# Patient Record
Sex: Female | Born: 1980
Health system: Southern US, Community
[De-identification: ages and names within clinical notes are randomized; demographics above are authoritative.]

## PROBLEM LIST (undated history)

## (undated) DIAGNOSIS — D6800 Von Willebrand disease, unspecified: Secondary | ICD-10-CM

## (undated) DIAGNOSIS — D689 Coagulation defect, unspecified: Secondary | ICD-10-CM

## (undated) DIAGNOSIS — N200 Calculus of kidney: Secondary | ICD-10-CM

## (undated) DIAGNOSIS — G43909 Migraine, unspecified, not intractable, without status migrainosus: Secondary | ICD-10-CM

## (undated) DIAGNOSIS — G9600 Cerebrospinal fluid leak, unspecified: Secondary | ICD-10-CM

## (undated) DIAGNOSIS — I10 Essential (primary) hypertension: Secondary | ICD-10-CM

## (undated) DIAGNOSIS — K219 Gastro-esophageal reflux disease without esophagitis: Secondary | ICD-10-CM

## (undated) DIAGNOSIS — H7090 Unspecified mastoiditis, unspecified ear: Secondary | ICD-10-CM

## (undated) DIAGNOSIS — Z9889 Other specified postprocedural states: Secondary | ICD-10-CM

## (undated) DIAGNOSIS — D649 Anemia, unspecified: Secondary | ICD-10-CM

## (undated) DIAGNOSIS — K279 Peptic ulcer, site unspecified, unspecified as acute or chronic, without hemorrhage or perforation: Secondary | ICD-10-CM

## (undated) DIAGNOSIS — Z9289 Personal history of other medical treatment: Secondary | ICD-10-CM

## (undated) DIAGNOSIS — J329 Chronic sinusitis, unspecified: Secondary | ICD-10-CM

## (undated) HISTORY — PX: APPENDECTOMY: SHX54

## (undated) HISTORY — PX: WISDOM TOOTH EXTRACTION: SHX21

## (undated) HISTORY — DX: Coagulation defect, unspecified: D68.9

## (undated) HISTORY — DX: Calculus of kidney: N20.0

## (undated) HISTORY — DX: Other specified postprocedural states: Z98.890

## (undated) HISTORY — PX: OTHER SURGICAL HISTORY: SHX169

## (undated) HISTORY — DX: Anemia, unspecified: D64.9

## (undated) HISTORY — PX: HAND TENDON SURGERY: SHX663

## (undated) HISTORY — DX: Peptic ulcer, site unspecified, unspecified as acute or chronic, without hemorrhage or perforation: K27.9

## (undated) HISTORY — PX: LAMINECTOMY: SHX219

---

## 2003-12-20 ENCOUNTER — Ambulatory Visit (HOSPITAL_COMMUNITY): Admission: RE | Admit: 2003-12-20 | Discharge: 2003-12-20 | Payer: Self-pay | Admitting: Otolaryngology

## 2004-09-26 ENCOUNTER — Other Ambulatory Visit: Admission: RE | Admit: 2004-09-26 | Discharge: 2004-09-26 | Payer: Self-pay | Admitting: *Deleted

## 2006-10-20 ENCOUNTER — Encounter (HOSPITAL_COMMUNITY): Admission: RE | Admit: 2006-10-20 | Discharge: 2006-11-11 | Payer: Self-pay | Admitting: Internal Medicine

## 2007-02-17 ENCOUNTER — Ambulatory Visit (HOSPITAL_COMMUNITY): Admission: RE | Admit: 2007-02-17 | Discharge: 2007-02-17 | Payer: Self-pay | Admitting: Family Medicine

## 2007-02-26 ENCOUNTER — Ambulatory Visit (HOSPITAL_COMMUNITY): Admission: RE | Admit: 2007-02-26 | Discharge: 2007-02-26 | Payer: Self-pay | Admitting: Family Medicine

## 2007-12-02 ENCOUNTER — Encounter: Payer: Self-pay | Admitting: Gynecology

## 2007-12-02 ENCOUNTER — Ambulatory Visit: Payer: Self-pay | Admitting: Gynecology

## 2007-12-02 ENCOUNTER — Other Ambulatory Visit: Admission: RE | Admit: 2007-12-02 | Discharge: 2007-12-02 | Payer: Self-pay | Admitting: Gynecology

## 2007-12-11 ENCOUNTER — Ambulatory Visit: Payer: Self-pay | Admitting: Gynecology

## 2008-01-04 ENCOUNTER — Ambulatory Visit (HOSPITAL_COMMUNITY): Admission: RE | Admit: 2008-01-04 | Discharge: 2008-01-04 | Payer: Self-pay | Admitting: General Surgery

## 2008-01-04 ENCOUNTER — Encounter (INDEPENDENT_AMBULATORY_CARE_PROVIDER_SITE_OTHER): Payer: Self-pay | Admitting: General Surgery

## 2008-02-12 DIAGNOSIS — S069X9A Unspecified intracranial injury with loss of consciousness of unspecified duration, initial encounter: Secondary | ICD-10-CM | POA: Insufficient documentation

## 2008-02-12 DIAGNOSIS — S069XAA Unspecified intracranial injury with loss of consciousness status unknown, initial encounter: Secondary | ICD-10-CM | POA: Insufficient documentation

## 2008-11-07 ENCOUNTER — Emergency Department (HOSPITAL_COMMUNITY): Admission: EM | Admit: 2008-11-07 | Discharge: 2008-11-08 | Payer: Self-pay | Admitting: Emergency Medicine

## 2008-11-07 ENCOUNTER — Emergency Department (HOSPITAL_COMMUNITY): Admission: EM | Admit: 2008-11-07 | Discharge: 2008-11-07 | Payer: Self-pay | Admitting: Family Medicine

## 2008-11-10 ENCOUNTER — Emergency Department (HOSPITAL_COMMUNITY): Admission: EM | Admit: 2008-11-10 | Discharge: 2008-11-11 | Payer: Self-pay | Admitting: Emergency Medicine

## 2009-05-08 ENCOUNTER — Emergency Department (HOSPITAL_COMMUNITY): Admission: EM | Admit: 2009-05-08 | Discharge: 2009-05-08 | Payer: Self-pay | Admitting: Emergency Medicine

## 2009-11-08 ENCOUNTER — Ambulatory Visit: Payer: Self-pay | Admitting: Internal Medicine

## 2009-11-08 DIAGNOSIS — G44309 Post-traumatic headache, unspecified, not intractable: Secondary | ICD-10-CM | POA: Insufficient documentation

## 2009-11-08 DIAGNOSIS — Z87442 Personal history of urinary calculi: Secondary | ICD-10-CM | POA: Insufficient documentation

## 2009-11-08 DIAGNOSIS — D6800 Von Willebrand disease, unspecified: Secondary | ICD-10-CM | POA: Insufficient documentation

## 2009-11-08 DIAGNOSIS — D649 Anemia, unspecified: Secondary | ICD-10-CM | POA: Insufficient documentation

## 2009-11-08 DIAGNOSIS — K279 Peptic ulcer, site unspecified, unspecified as acute or chronic, without hemorrhage or perforation: Secondary | ICD-10-CM | POA: Insufficient documentation

## 2010-01-03 ENCOUNTER — Ambulatory Visit: Payer: Self-pay | Admitting: Internal Medicine

## 2010-01-05 LAB — CONVERTED CEMR LAB
Basophils Absolute: 0 10*3/uL (ref 0.0–0.1)
Basophils Relative: 0.2 % (ref 0.0–3.0)
Eosinophils Absolute: 0 10*3/uL (ref 0.0–0.7)
Eosinophils Relative: 0.5 % (ref 0.0–5.0)
HCT: 40 % (ref 36.0–46.0)
Hemoglobin: 13.7 g/dL (ref 12.0–15.0)
Lymphocytes Relative: 26.8 % (ref 12.0–46.0)
Lymphs Abs: 1.5 10*3/uL (ref 0.7–4.0)
MCHC: 34.2 g/dL (ref 30.0–36.0)
MCV: 95.6 fL (ref 78.0–100.0)
Monocytes Absolute: 0.6 10*3/uL (ref 0.1–1.0)
Monocytes Relative: 10.7 % (ref 3.0–12.0)
Neutro Abs: 3.4 10*3/uL (ref 1.4–7.7)
Neutrophils Relative %: 61.8 % (ref 43.0–77.0)
Platelets: 239 10*3/uL (ref 150.0–400.0)
RBC: 4.18 M/uL (ref 3.87–5.11)
RDW: 15.8 % — ABNORMAL HIGH (ref 11.5–14.6)
WBC: 5.6 10*3/uL (ref 4.5–10.5)

## 2010-01-15 ENCOUNTER — Ambulatory Visit (HOSPITAL_COMMUNITY)
Admission: RE | Admit: 2010-01-15 | Discharge: 2010-01-15 | Payer: Self-pay | Source: Home / Self Care | Admitting: Internal Medicine

## 2010-03-13 NOTE — Assessment & Plan Note (Signed)
Summary: congested/cbs   Vital Signs:  Patient profile:   30 year old female Weight:      126.8 pounds BMI:     21.34 Temp:     98.0 degrees F oral Pulse rate:   96 / minute Resp:     16 per minute BP sitting:   128 / 86  (left arm) Cuff size:   regular  Vitals Entered By: Shonna Chock CMA (January 03, 2010 10:44 AM) CC: Congestion , URI symptoms   CC:  Congestion  and URI symptoms.  History of Present Illness: URI Symptoms      This is a 30 year old woman who presents with URI symptoms X 7- 10 days, onset as fatigue & earache (this has resolved).  The patient now  reports nasal congestion, purulent nasal discharge, and dry cough, but denies sore throat and earache.  The patient denies fever, dyspnea, and wheezing.  The patient also reports frontal  headache& bilateral facial pain, tooth pain, and tender adenopathy.  Rx: Neti pot, Mucinex, Flonase  Current Medications (verified): 1)  Promethazine Hcl 12.5 Mg Tabs (Promethazine Hcl) .... As Needed 2)  Fioricet 50-325-40 Mg Tabs (Butalbital-Apap-Caffeine) .... As Needed 3)  Metronidazole 500 Mg Tabs (Metronidazole) .Marland Kitchen.. 1 Three Times A Day 4)  Fluticasone Propionate 50 Mcg/act Susp (Fluticasone Propionate) .Marland Kitchen.. 1 Spray Two Times A Day As Crossover Technique 5)  Cymbalta 30 Mg Cpep (Duloxetine Hcl) .Marland Kitchen.. 1 By Mouth Two Times A Day 6)  Altavera 0.15-30 Mg-Mcg Tabs (Levonorgestrel-Ethinyl Estrad) .Marland Kitchen.. 1 By Mouth Once Daily  Allergies: 1)  ! Levaquin 2)  ! Asa  Review of Systems Heme:  Complains of abnormal bruising.  Physical Exam  General:  Thin,in no acute distress; alert,appropriate and cooperative throughout examination Ears:  External ear exam shows no significant lesions or deformities.  Otoscopic examination reveals clear canals, tympanic membranes are intact bilaterally without bulging, retraction, inflammation or discharge. Hearing is grossly normal bilaterally. Nose:  External nasal examination shows no deformity or  inflammation. Nasal mucosa are pink and moist without lesions or exudates. Hyponasal speech Mouth:  Oral mucosa and oropharynx without lesions or exudates.  Teeth in good repair.  Lungs:  Normal respiratory effort, chest expands symmetrically. Lungs are clear to auscultation, no crackles or wheezes. Heart:  Normal rate and regular rhythm. S1 and S2 normal without gallop, murmur, click, rub  Abdomen:  Bowel sounds positive,abdomen soft and non-tender without masses, organomegaly or hernias noted. Skin:  Intact without suspicious lesions or rashes Cervical Nodes:  R anterior LN tender and L anterior LN tender.   Axillary Nodes:  No palpable lymphadenopathy   Impression & Recommendations:  Problem # 1:  SINUSITIS- ACUTE-NOS (ICD-461.9)  The following medications were removed from the medication list:    Metronidazole 500 Mg Tabs (Metronidazole) .Marland Kitchen... 1 three times a day Her updated medication list for this problem includes:    Fluticasone Propionate 50 Mcg/act Susp (Fluticasone propionate) .Marland Kitchen... 1 spray two times a day as crossover technique    Amoxicillin-pot Clavulanate 500-125 Mg Tabs (Amoxicillin-pot clavulanate) .Marland Kitchen... 1 every 12 hrs with a meal  Orders: Venipuncture (04540) TLB-CBC Platelet - w/Differential (85025-CBCD)  Problem # 2:  VON WILLEBRAND'S DISEASE (ICD-286.4)  recent bruising; protracted menses addressed by Gyn with change in BCP  Orders: Venipuncture (98119) TLB-CBC Platelet - w/Differential (85025-CBCD)  Complete Medication List: 1)  Promethazine Hcl 12.5 Mg Tabs (Promethazine hcl) .... As needed 2)  Fioricet 50-325-40 Mg Tabs (Butalbital-apap-caffeine) .... As needed 3)  Fluticasone Propionate 50 Mcg/act Susp (Fluticasone propionate) .Marland Kitchen.. 1 spray two times a day as crossover technique 4)  Cymbalta 30 Mg Cpep (Duloxetine hcl) .Marland Kitchen.. 1 by mouth two times a day 5)  Altavera 0.15-30 Mg-mcg Tabs (Levonorgestrel-ethinyl estrad) .Marland Kitchen.. 1 by mouth once daily 6)   Amoxicillin-pot Clavulanate 500-125 Mg Tabs (Amoxicillin-pot clavulanate) .Marland Kitchen.. 1 every 12 hrs with a meal  Patient Instructions: 1)  Neti pot once daily  to two times a day as needed followed by Flonase.Use protection while on antibiotic. 2)  Drink as much  NON dairy fluid as you can tolerate for the next few days. Prescriptions: AMOXICILLIN-POT CLAVULANATE 500-125 MG TABS (AMOXICILLIN-POT CLAVULANATE) 1 every 12 hrs with a meal  #20 x 0   Entered and Authorized by:   Marga Melnick MD   Signed by:   Marga Melnick MD on 01/03/2010   Method used:   Print then Give to Patient   RxID:   [id number]    Orders Added: 1)  Est. Patient Level III [14782] 2)  Venipuncture [95621] 3)  TLB-CBC Platelet - w/Differential [85025-CBCD]  Appended Document: congested/cbs

## 2010-03-13 NOTE — Assessment & Plan Note (Signed)
Summary: NEW TO EST/KN   Vital Signs:  Patient profile:   30 year old female Height:      64.75 inches Weight:      125 pounds BMI:     21.04 Temp:     97.7 degrees F oral Pulse rate:   80 / minute Resp:     14 per minute BP sitting:   110 / 64  (left arm) Cuff size:   regular  Vitals Entered By: Shonna Chock CMA (November 08, 2009 2:19 PM) CC: New patient establish: sinuses and swollen glands , URI symptoms   CC:  New patient establish: sinuses and swollen glands  and URI symptoms.  History of Present Illness: Lorraine Weber) is here to establish as a new patient; she feels she has had recurrent sinusitis this Summer.The patient reports purulent nasal discharge and productive cough (only when supine), but denies earache.  Associated symptoms include fever  up to  101 degrees.  The patient denies dyspnea, wheezing, rash, and diarrhea.  The patient also reports frontal  headache.  The patient denies itchy watery eyes and sneezing.  Risk factors for Strep sinusitis include bilateral facial pain, tooth pain, and tender adenopathy.  She was treated @ [facility] in June with Amoxicillin  w/o response & Biaxin in July with improvement. CT of head  in 10/2009 by Neurologist ,revealed "mild mastoiditis"   Preventive Screening-Counseling & Management  Alcohol-Tobacco     Smoking Status: current  Caffeine-Diet-Exercise     Does Patient Exercise: yes  Current Medications (verified): 1)  Lamictal 100 Mg Tabs (Lamotrigine) .Marland Kitchen.. 1 By Mouth Once Daily Then Increase 2 By Mouth Once Daily 2)  Propranolol Hcl Cr 120 Mg Xr24h-Cap (Propranolol Hcl) .Marland Kitchen.. 1 By Mouth Once Daily 3)  Sprintec 28 0.25-35 Mg-Mcg Tabs (Norgestimate-Eth Estradiol) .... As Directed 4)  Promethazine Hcl 12.5 Mg Tabs (Promethazine Hcl) .... As Needed 5)  Fioricet 50-325-40 Mg Tabs (Butalbital-Apap-Caffeine) .... As Needed  Allergies (verified): 1)  ! Levaquin 2)  ! Jonne Ply  Past History:  Past Medical History: Von  Willebrand's Disease Anemia, PMH of Headache, migraines , Beta blocker prophylaxis& Lamictal, Dr Estella Husk, Neuro, [place] Nephrolithiasis, hx of X 5, Dr Annabell Howells Peptic ulcer disease Concussion X 8 , last 2010, residual Homonymous Hemianopsia  Past Surgical History: Tendon surgery L wrist , complicated by  MRSA Appendectomy  Family History: Father: living:  arthritis,high cholesterol, DM; PGF-prostate cancer, high cholesterol,DM Mother: deceased age 28 , heart disease;MGM/MGF-heart disease, M-aunts-heart disease/stroke, M-uncle-heart disease, MGM-DM Siblings: 1 brother- congenital heart disease  Social History: Occupation: [employer] Single Current Smoker: socially , < 1/2 pp month Alcohol use-no Regular exercise-yes Smoking Status:  current Does Patient Exercise:  yes  Physical Exam  General:  Thin,well-nourished,in no acute distress; alert,appropriate and cooperative throughout examination Head:  Normocephalic and atraumatic without obvious abnormalities.  Eyes:  No corneal or conjunctival inflammation noted. Decreased OS lateral rotation initially.Slight ptosis OS. Perrla.  Ears:  External ear exam shows no significant lesions or deformities.  Otoscopic examination reveals clear canals, tympanic membranes are intact bilaterally without bulging, retraction, inflammation or discharge. Hearing is grossly normal bilaterally. Nose:  External nasal examination shows no deformity or inflammation. Nasal mucosa are pink and moist without lesions or exudates. Mouth:  Oral mucosa and oropharynx without lesions or exudates.  Teeth in good repair. Neck:  No deformities, masses, or tenderness noted. Lungs:  Normal respiratory effort, chest expands symmetrically. Lungs are clear to auscultation, no crackles or  wheezes. Heart:  normal rate, regular rhythm, no gallop, no rub, no JVD, and grade 1 /6 systolic murmur.   Skin:  Intact without suspicious lesions or rashes Cervical Nodes:  R  posterior LN tender and L posterior LN tender.   Axillary Nodes:  No palpable lymphadenopathy Psych:  memory intact for recent and remote, normally interactive, and good eye contact.     Impression & Recommendations:  Problem # 1:  SINUSITIS- ACUTE-NOS (ICD-461.9) Assessment Comment Only  Her updated medication list for this problem includes:    Metronidazole 500 Mg Tabs (Metronidazole) .Marland Kitchen... 1 three times a day    Fluticasone Propionate 50 Mcg/act Susp (Fluticasone propionate) .Marland Kitchen... 1 spray two times a day as crossover technique  Complete Medication List: 1)  Lamictal 100 Mg Tabs (Lamotrigine) .Marland Kitchen.. 1 by mouth once daily then increase 2 by mouth once daily 2)  Propranolol Hcl Cr 120 Mg Xr24h-cap (Propranolol hcl) .Marland Kitchen.. 1 by mouth once daily 3)  Sprintec 28 0.25-35 Mg-mcg Tabs (Norgestimate-eth estradiol) .... As directed 4)  Promethazine Hcl 12.5 Mg Tabs (Promethazine hcl) .... As needed 5)  Fioricet 50-325-40 Mg Tabs (Butalbital-apap-caffeine) .... As needed 6)  Metronidazole 500 Mg Tabs (Metronidazole) .Marland Kitchen.. 1 three times a day 7)  Fluticasone Propionate 50 Mcg/act Susp (Fluticasone propionate) .Marland Kitchen.. 1 spray two times a day as crossover technique  Patient Instructions: 1)  Neti pot once daily until sinuses are clear. 2)  Drink as much NON dairy  fluid as you can tolerate for the next few days. Prescriptions: FLUTICASONE PROPIONATE 50 MCG/ACT SUSP (FLUTICASONE PROPIONATE) 1 spray two times a day as crossover technique  #1 x 11   Entered and Authorized by:   Marga Melnick MD   Signed by:   Marga Melnick MD on 11/08/2009   Method used:   Print then Give to Patient   RxID:   [id number] METRONIDAZOLE 500 MG TABS (METRONIDAZOLE) 1 three times a day  #30 x 0   Entered and Authorized by:   Marga Melnick MD   Signed by:   Marga Melnick MD on 11/08/2009   Method used:   Print then Give to Patient   RxID:   [id number]    Immunization History:  Tetanus/Td Immunization  History:    Tetanus/Td:  historical (02/11/2002)  Pneumovax Immunization History:    Pneumovax:  historical (02/11/2002)

## 2010-03-24 ENCOUNTER — Emergency Department (HOSPITAL_COMMUNITY)
Admission: EM | Admit: 2010-03-24 | Discharge: 2010-03-24 | Disposition: A | Discharge status: Home / Self Care | Payer: Worker's Compensation | Attending: Emergency Medicine | Admitting: Emergency Medicine

## 2010-03-24 DIAGNOSIS — D6800 Von Willebrand disease, unspecified: Secondary | ICD-10-CM | POA: Insufficient documentation

## 2010-03-24 DIAGNOSIS — H547 Unspecified visual loss: Secondary | ICD-10-CM | POA: Insufficient documentation

## 2010-03-24 DIAGNOSIS — Z79899 Other long term (current) drug therapy: Secondary | ICD-10-CM | POA: Insufficient documentation

## 2010-03-24 DIAGNOSIS — R51 Headache: Secondary | ICD-10-CM | POA: Insufficient documentation

## 2010-03-24 DIAGNOSIS — R112 Nausea with vomiting, unspecified: Secondary | ICD-10-CM | POA: Insufficient documentation

## 2010-03-24 DIAGNOSIS — H53149 Visual discomfort, unspecified: Secondary | ICD-10-CM | POA: Insufficient documentation

## 2010-03-24 LAB — PREGNANCY, URINE: Preg Test, Ur: NEGATIVE

## 2010-03-24 LAB — VALPROIC ACID LEVEL: Valproic Acid Lvl: 12.4 ug/mL — ABNORMAL LOW (ref 50.0–100.0)

## 2010-04-02 ENCOUNTER — Encounter: Payer: Self-pay | Admitting: Internal Medicine

## 2010-04-02 ENCOUNTER — Ambulatory Visit (INDEPENDENT_AMBULATORY_CARE_PROVIDER_SITE_OTHER): Payer: 59 | Admitting: Internal Medicine

## 2010-04-02 DIAGNOSIS — K279 Peptic ulcer, site unspecified, unspecified as acute or chronic, without hemorrhage or perforation: Secondary | ICD-10-CM

## 2010-04-02 DIAGNOSIS — J019 Acute sinusitis, unspecified: Secondary | ICD-10-CM

## 2010-04-02 DIAGNOSIS — D6800 Von Willebrand disease, unspecified: Secondary | ICD-10-CM

## 2010-04-02 DIAGNOSIS — K219 Gastro-esophageal reflux disease without esophagitis: Secondary | ICD-10-CM | POA: Insufficient documentation

## 2010-04-10 NOTE — Assessment & Plan Note (Signed)
Summary: Possible sinus infection/kb   Vital Signs:  Patient profile:   30 year old female Weight:      124.8 pounds BMI:     21.00 Temp:     98.2 degrees F oral Pulse rate:   88 / minute Resp:     15 per minute BP sitting:   118 / 80  (left arm) Cuff size:   regular  Vitals Entered By: Shonna Chock CMA (April 02, 2010 2:44 PM) CC: 1.) Sinuses   2.) Stomach discomfort: Nausea, vomitting (passing). Discomfort x 2 weeks and getting worse, Heartburn, URI symptoms   CC:  1.) Sinuses   2.) Stomach discomfort: Nausea, vomitting (passing). Discomfort x 2 weeks and getting worse, Heartburn, and URI symptoms.  History of Present Illness:    Onset several weeks as nausea; major symptom now is "knot" in epigastrium which started 1 month ago. She reports weight loss of "few #s" , but denies acid reflux, sour taste in mouth, chest pain, and trouble swallowing.  The patient denies the following alarm features: melena, dysphagia, and vomiting.  Symptoms a have no triggers.  The patient has found the following treatments to be  partially effective effective: an antacid (TUMS ) & Pepto Bismol..      She  also presents with URI symptoms for 1 week.  The patient reports nasal congestion and purulent nasal discharge, but denies productive cough and earache.  Associated symptoms include lo frontal  headache ,bilateral facial pain, tooth pain, and tender adenopathy.  Rx: Neti pot, Mucinex.  Current Medications (verified): 1)  Promethazine Hcl 12.5 Mg Tabs (Promethazine Hcl) .... As Needed 2)  Fioricet 50-325-40 Mg Tabs (Butalbital-Apap-Caffeine) .... As Needed 3)  Fluticasone Propionate 50 Mcg/act Susp (Fluticasone Propionate) .Marland Kitchen.. 1 Spray Two Times A Day As Crossover Technique 4)  Cymbalta 30 Mg Cpep (Duloxetine Hcl) .Marland Kitchen.. 1 By Mouth Two Times A Day 5)  Altavera 0.15-30 Mg-Mcg Tabs (Levonorgestrel-Ethinyl Estrad) .Marland Kitchen.. 1 By Mouth Once Daily 6)  Depakote 250 Mg Tbec (Divalproex Sodium) .Marland Kitchen.. 1 By Mouth Once  Daily For Migraines  Allergies: 1)  ! Levaquin 2)  ! Asa  Physical Exam  General:   Thin but in no acute distress; alert,appropriate and cooperative throughout examination Eyes:  No corneal or conjunctival inflammation noted. No icterus Ears:  External ear exam shows no significant lesions or deformities.  Otoscopic examination reveals clear canals, tympanic membranes are intact bilaterally without bulging, retraction, inflammation or discharge. Hearing is grossly normal bilaterally. Nose:  External nasal examination shows no deformity or inflammation. Nasal mucosa are  erythematous without lesions or exudates. Marked hyponasal speech Mouth:  Oral mucosa and oropharynx without lesions or exudates.  Teeth in good repair. Mild pharyngeal erythema.   Lungs:  Normal respiratory effort, chest expands symmetrically. Lungs are clear to auscultation, no crackles or wheezes. Heart:  Normal rate and regular rhythm. S1 and S2 normal without gallop, murmur, click, rub . S4 with slurring Abdomen:  Bowel sounds positive,abdomen soft and non-tender without masses, organomegaly or hernias noted. Aorta palpable Skin:  Intact without suspicious lesions or rashes. No jaundice Cervical Nodes:  L > LA  Axillary Nodes:  No palpable lymphadenopathy   Impression & Recommendations:  Problem # 1:  GERD (ICD-530.81)  Her updated medication list for this problem includes:    Ranitidine Hcl 150 Mg Tabs (Ranitidine hcl) .Marland KitchenMarland KitchenMarland KitchenMarland Kitchen 1bid pre meals  Problem # 2:  SINUSITIS, ACUTE (ICD-461.9)  The following medications were removed from the medication list:  Amoxicillin-pot Clavulanate 500-125 Mg Tabs (Amoxicillin-pot clavulanate) .Marland Kitchen... 1 every 12 hrs with a meal Her updated medication list for this problem includes:    Fluticasone Propionate 50 Mcg/act Susp (Fluticasone propionate) .Marland Kitchen... 1 spray two times a day as crossover technique    Amoxicillin 500 Mg Caps (Amoxicillin) .Marland Kitchen... 1 three times a day (may affect bcp  efficacy)  Problem # 3:  PEPTIC ULCER DISEASE (ICD-533.90)  PMH of  Her updated medication list for this problem includes:    Ranitidine Hcl 150 Mg Tabs (Ranitidine hcl) .Marland KitchenMarland KitchenMarland KitchenMarland Kitchen 1bid pre meals  Problem # 4:  VON WILLEBRAND'S DISEASE (ICD-286.4)  Complete Medication List: 1)  Promethazine Hcl 12.5 Mg Tabs (Promethazine hcl) .... As needed 2)  Fioricet 50-325-40 Mg Tabs (Butalbital-apap-caffeine) .... As needed 3)  Fluticasone Propionate 50 Mcg/act Susp (Fluticasone propionate) .Marland Kitchen.. 1 spray two times a day as crossover technique 4)  Cymbalta 30 Mg Cpep (Duloxetine hcl) .Marland Kitchen.. 1 by mouth two times a day 5)  Altavera 0.15-30 Mg-mcg Tabs (Levonorgestrel-ethinyl estrad) .Marland Kitchen.. 1 by mouth once daily 6)  Depakote 250 Mg Tbec (Divalproex sodium) .Marland Kitchen.. 1 by mouth once daily for migraines 7)  Ranitidine Hcl 150 Mg Tabs (Ranitidine hcl) .Marland Kitchen.. 1bid pre meals 8)  Amoxicillin 500 Mg Caps (Amoxicillin) .Marland Kitchen.. 1 three times a day (may affect bcp efficacy)  Patient Instructions: 1)  Avoid triggers for GERD as discussed  &  foods high in acid (tomatoes, citrus juices, spicy foods). Avoid eating within two hours of lying down or before exercising. Do not over eat; try smaller more frequent meals. You may need to elevate head of bed twelve inches when sleeping. Prescriptions: AMOXICILLIN 500 MG CAPS (AMOXICILLIN) 1 three times a day (may affect BCP efficacy)  #30 x 0   Entered and Authorized by:   Marga Melnick MD   Signed by:   Marga Melnick MD on 04/02/2010   Method used:   Electronically to        [facility] [phone]* (retail)       [address]       [place], [place]  [address]       Ph: [phone]       Fax: [phone]   RxID:   [id number] RANITIDINE HCL 150 MG TABS (RANITIDINE HCL) 1bid pre meals  #60 x 2   Entered and Authorized by:   Marga Melnick MD   Signed by:   Marga Melnick MD on 04/02/2010   Method used:   Electronically to         [facility] #[id number]* (retail)       [address]       [place], [place]  [address]       Ph: [phone]       Fax: [phone]   RxID:   [id number]    Orders Added: 1)  Est. Patient Level IV [16010]

## 2010-04-16 ENCOUNTER — Observation Stay (HOSPITAL_COMMUNITY)
Admission: RE | Admit: 2010-04-16 | Discharge: 2010-04-17 | Disposition: A | Discharge status: Home / Self Care | Payer: Worker's Compensation | Source: Ambulatory Visit | Attending: Internal Medicine | Admitting: Internal Medicine

## 2010-04-16 DIAGNOSIS — F17200 Nicotine dependence, unspecified, uncomplicated: Secondary | ICD-10-CM | POA: Insufficient documentation

## 2010-04-16 DIAGNOSIS — D6800 Von Willebrand disease, unspecified: Secondary | ICD-10-CM | POA: Insufficient documentation

## 2010-04-16 DIAGNOSIS — G43919 Migraine, unspecified, intractable, without status migrainosus: Principal | ICD-10-CM | POA: Insufficient documentation

## 2010-04-16 LAB — URINALYSIS, ROUTINE W REFLEX MICROSCOPIC
Bilirubin Urine: NEGATIVE
Glucose, UA: NEGATIVE mg/dL
Leukocytes, UA: NEGATIVE
Nitrite: NEGATIVE
Protein, ur: NEGATIVE mg/dL
Specific Gravity, Urine: 1.025 (ref 1.005–1.030)
Urobilinogen, UA: 0.2 mg/dL (ref 0.0–1.0)
pH: 6 (ref 5.0–8.0)

## 2010-04-16 LAB — BASIC METABOLIC PANEL
BUN: 11 mg/dL (ref 6–23)
CO2: 25 mEq/L (ref 19–32)
Calcium: 8.7 mg/dL (ref 8.4–10.5)
Chloride: 104 mEq/L (ref 96–112)
Creatinine, Ser: 0.8 mg/dL (ref 0.4–1.2)
GFR calc Af Amer: >60 mL/min (ref >60)
GFR calc non Af Amer: >60 mL/min (ref >60)
Glucose, Bld: 72 mg/dL (ref 70–99)
Potassium: 4.1 mEq/L (ref 3.5–5.1)
Sodium: 138 mEq/L (ref 135–145)

## 2010-04-16 LAB — PREGNANCY, URINE: Preg Test, Ur: NEGATIVE

## 2010-04-16 LAB — PROTIME-INR
INR: 0.96 (ref 0.00–1.49)
Prothrombin Time: 13 seconds (ref 11.6–15.2)

## 2010-04-16 LAB — DIFFERENTIAL
Basophils Absolute: 0 10*3/uL (ref 0.0–0.1)
Basophils Relative: 0 % (ref 0–1)
Eosinophils Absolute: 0 10*3/uL (ref 0.0–0.7)
Eosinophils Relative: 1 % (ref 0–5)
Lymphocytes Relative: 32 % (ref 12–46)
Lymphs Abs: 2.4 10*3/uL (ref 0.7–4.0)
Monocytes Absolute: 0.5 10*3/uL (ref 0.1–1.0)
Monocytes Relative: 7 % (ref 3–12)
Neutro Abs: 4.6 10*3/uL (ref 1.7–7.7)
Neutrophils Relative %: 61 % (ref 43–77)

## 2010-04-16 LAB — CBC
HCT: 39.2 % (ref 36.0–46.0)
Hemoglobin: 13.1 g/dL (ref 12.0–15.0)
MCH: 31.5 pg (ref 26.0–34.0)
MCHC: 33.4 g/dL (ref 30.0–36.0)
MCV: 94.2 fL (ref 78.0–100.0)
Platelets: 254 10*3/uL (ref 150–400)
RBC: 4.16 MIL/uL (ref 3.87–5.11)
RDW: 15.9 % — ABNORMAL HIGH (ref 11.5–15.5)
WBC: 7.5 10*3/uL (ref 4.0–10.5)

## 2010-04-16 LAB — URINE MICROSCOPIC-ADD ON

## 2010-04-16 LAB — APTT: aPTT: 33 seconds (ref 24–37)

## 2010-04-17 LAB — MAGNESIUM: Magnesium: 2.5 mg/dL (ref 1.5–2.5)

## 2010-04-17 LAB — CBC
HCT: 37.2 % (ref 36.0–46.0)
Hemoglobin: 12.6 g/dL (ref 12.0–15.0)
MCH: 32.1 pg (ref 26.0–34.0)
MCHC: 33.9 g/dL (ref 30.0–36.0)
MCV: 94.7 fL (ref 78.0–100.0)
Platelets: 238 10*3/uL (ref 150–400)
RBC: 3.93 MIL/uL (ref 3.87–5.11)
RDW: 15.9 % — ABNORMAL HIGH (ref 11.5–15.5)
WBC: 6.1 10*3/uL (ref 4.0–10.5)

## 2010-04-17 LAB — DIFFERENTIAL
Basophils Absolute: 0 10*3/uL (ref 0.0–0.1)
Basophils Relative: 0 % (ref 0–1)
Eosinophils Absolute: 0 10*3/uL (ref 0.0–0.7)
Eosinophils Relative: 1 % (ref 0–5)
Lymphocytes Relative: 48 % — ABNORMAL HIGH (ref 12–46)
Lymphs Abs: 2.9 10*3/uL (ref 0.7–4.0)
Monocytes Absolute: 0.7 10*3/uL (ref 0.1–1.0)
Monocytes Relative: 11 % (ref 3–12)
Neutro Abs: 2.5 10*3/uL (ref 1.7–7.7)
Neutrophils Relative %: 41 % — ABNORMAL LOW (ref 43–77)

## 2010-04-17 LAB — COMPREHENSIVE METABOLIC PANEL
ALT: 9 U/L (ref 0–35)
AST: 14 U/L (ref 0–37)
Albumin: 3.2 g/dL — ABNORMAL LOW (ref 3.5–5.2)
Alkaline Phosphatase: 39 U/L (ref 39–117)
BUN: 8 mg/dL (ref 6–23)
CO2: 25 mEq/L (ref 19–32)
Calcium: 7.7 mg/dL — ABNORMAL LOW (ref 8.4–10.5)
Chloride: 104 mEq/L (ref 96–112)
Creatinine, Ser: 0.73 mg/dL (ref 0.4–1.2)
GFR calc Af Amer: >60 mL/min (ref >60)
GFR calc non Af Amer: >60 mL/min (ref >60)
Glucose, Bld: 78 mg/dL (ref 70–99)
Potassium: 3.8 mEq/L (ref 3.5–5.1)
Sodium: 133 mEq/L — ABNORMAL LOW (ref 135–145)
Total Bilirubin: 0.7 mg/dL (ref 0.3–1.2)
Total Protein: 5.7 g/dL — ABNORMAL LOW (ref 6.0–8.3)

## 2010-04-20 NOTE — Discharge Summary (Signed)
[name], Lorraine Weber                ACCOUNT NO.:  [id number]  MEDICAL RECORD NO.:  [id number]           PATIENT TYPE:  I  LOCATION:  A211                          FACILITY:  [facility]  PHYSICIAN:  Elliot Cousin, M.D.    DATE OF BIRTH:  1980/10/24  DATE OF ADMISSION:  04/16/2010 DATE OF DISCHARGE:  03/06/2012LH                              DISCHARGE SUMMARY   DISCHARGE DIAGNOSES: 1. Intractable migraine headache. 2. Tobacco abuse. 3. Von Willebrand disease.  DISCHARGE MEDICATIONS: 1. Magnesium oxide 400 mg b.i.d. 2. Phenergan 12.5 mg one to two tablets every 6 hours as needed for     nausea or vomiting. 3. Calcium 600 mg daily. 4. Cymbalta 30 mg b.i.d. 5. Altavera 20/90 mcg one tablet daily. 6. Fioricet one tablet every 8 hours as needed for migraines. 7. Hydrocodone 10/325 mg one tablet every 6 hours as needed for pain. 8. Vitamin D 50,000 units one capsule weekly on Tuesdays. 9. Stop Depakote (the patient had stopped taking it on her own).  DISCHARGE DISPOSITION:  The patient was discharged home in improved and stable condition on April 17, 2010.  She will follow up with her neurologist in [facility] Dr. Estella Husk at his next available appointment.  She will follow up with Dr. Sherwood Gambler as needed.  CONSULTATIONS:  None.  PROCEDURES PERFORMED:  None.  HISTORY OF PRESENTING ILLNESS:  The patient is a 30 year old woman with a past medical history significant for migraine headaches who presented to the emergency department on April 16, 2010, with a chief complaint of intractable migraine headache.  When she was initially evaluated, she was noted to be afebrile and hemodynamically stable.  She was admitted for further evaluation and management.  HOSPITAL COURSE:  The patient was started on IV fluid hydration.  She was given 2 g of magnesium sulfate intravenously.  Depakote was discontinued as she reported that it did not help.  She was maintained on as-needed hydrocodone, Cymbalta,  as-needed Fioricet, and supportive care.  Following supportive treatment and analgesics, her pain subsided. She was asked if she had ever been treated with any of the triptans. She stated "no."  She had been treated with a number of other non- triptan migraine medications; however, she has only had mild and limited success with them.  She is followed by neurologist, Dr. Estella Husk in [place].  She was advised to follow up with him at his next available appointment.  She was discharged home with prescriptions for Phenergan and hydrocodone.  She was advised to increase magnesium oxide to 400 mg b.i.d.  LABORATORY DATA:  At the time of discharge, magnesium 2.5, sodium 133, potassium 3.8, chloride 104, CO2 is 25, glucose 78, BUN 8, creatinine 0.73, total bilirubin 0.7, alkaline phosphatase 39, SGOT 14, SGPT 9, total protein 5.7, albumin 3.2, calcium 7.7.  WBC 6.1, hemoglobin 12.6, platelet count 238.  Urinalysis:  Trace ketones, trace blood, otherwise negative.  Urine HCG negative.     Elliot Cousin, M.D.     DF/MEDQ  D:  04/17/2010  T:  04/18/2010  Job:  [id number]  cc:   Madelin Rear. Sherwood Gambler, MD Fax: [phone]  Dr. Aletha Halim  Electronically Signed by Elliot Cousin M.D. on 04/19/2010 08:56:34 AM

## 2010-04-25 ENCOUNTER — Encounter: Payer: Self-pay | Admitting: Internal Medicine

## 2010-04-25 ENCOUNTER — Ambulatory Visit (INDEPENDENT_AMBULATORY_CARE_PROVIDER_SITE_OTHER): Payer: Worker's Compensation | Admitting: Internal Medicine

## 2010-04-25 DIAGNOSIS — H9209 Otalgia, unspecified ear: Secondary | ICD-10-CM

## 2010-04-25 DIAGNOSIS — H7090 Unspecified mastoiditis, unspecified ear: Secondary | ICD-10-CM | POA: Insufficient documentation

## 2010-04-29 ENCOUNTER — Encounter: Payer: Self-pay | Admitting: Internal Medicine

## 2010-05-01 NOTE — Assessment & Plan Note (Signed)
Summary: left ear bleeding, headache///sph   Vital Signs:  Patient profile:   30 year old female Weight:      123.6 pounds BMI:     20.80 Temp:     98.4 degrees F oral Pulse rate:   84 / minute Resp:     14 per minute BP sitting:   122 / 80  (left arm) Cuff size:   regular  Vitals Entered By: Shonna Chock CMA (April 25, 2010 11:56 AM) CC: 1.) Left earache: last night patient noticed blood from ear x 1  2.) Constant headaches , Ear pain   CC:  1.) Left earache: last night patient noticed blood from ear x 1  2.) Constant headaches  and Ear pain.  History of Present Illness:    Onset as L  earache last week ; bleeding as of 03/13 w/o trauma or trigger. She now reports sensation of fullness,slight  tinnitus, and nasal discharge( chronic , not new), but denies ear discharge, hearing loss, fever, and sinus pain.  The  pain is located in the left ear and behind the left ear.  The pain is described as constant & throbbing.  Associated symptoms include toothache  and slight postural  dizziness. She sees Neurologist for migraines in context of prior concussion. Rx: none. CT 10/20/2009 ordered by Dr Estella Husk: mild focal mastoiditis.  Current Medications (verified): 1)  Promethazine Hcl 12.5 Mg Tabs (Promethazine Hcl) .... As Needed 2)  Fioricet 50-325-40 Mg Tabs (Butalbital-Apap-Caffeine) .... As Needed 3)  Fluticasone Propionate 50 Mcg/act Susp (Fluticasone Propionate) .Marland Kitchen.. 1 Spray Two Times A Day As Crossover Technique 4)  Cymbalta 30 Mg Cpep (Duloxetine Hcl) .Marland Kitchen.. 1 By Mouth Two Times A Day 5)  Altavera 0.15-30 Mg-Mcg Tabs (Levonorgestrel-Ethinyl Estrad) .Marland Kitchen.. 1 By Mouth Once Daily 6)  Ranitidine Hcl 150 Mg Tabs (Ranitidine Hcl) .Marland Kitchen.. 1bid Pre Meals 7)  Vimpat 50 Mg Tabs (Lacosamide) .... Right Now: 2 By Mouth Once Daily , Patient Is Gradually Increasing Per Neuro 8)  Carisoprodol 350 Mg Tabs (Carisoprodol) .Marland Kitchen.. 1 By Mouth Every 8 Hours  Allergies: 1)  ! Levaquin 2)  ! Asa  Physical  Exam  General:  in no acute distress; alert,appropriate and cooperative throughout examination Eyes:  No corneal or conjunctival inflammation noted.Slight ptosis OS  Ears:  External ear exam shows no significant lesions or deformities.  Otoscopic examination reveals clear canals, tympanic membranes are intact bilaterally without bulging, retraction, inflammation or discharge. L TM dull. Slightly tender L TMJ with slight crepitus & minimally over mastoid  Nose:  External nasal examination shows no deformity or inflammation. Nasal mucosa are pink and moist without lesions or exudates. Mouth:  Oral mucosa and oropharynx without lesions or exudates.  Teeth in good repair. Cervical Nodes:  L shotty  lymphadenopathy noted Axillary Nodes:  No palpable lymphadenopathy   Impression & Recommendations:  Problem # 1:  EAR PAIN, LEFT (ICD-388.70) R/O otitis media  vs Eustachian tube dysfunction The following medications were removed from the medication list:    Amoxicillin 500 Mg Caps (Amoxicillin) .Marland Kitchen... 1 three times a day (may affect bcp efficacy) Her updated medication list for this problem includes:    Cefuroxime Axetil 500 Mg Tabs (Cefuroxime axetil) .Marland Kitchen... 1 two times a day  Problem # 2:  MASTOIDITIS (ICD-383.9)  Complete Medication List: 1)  Promethazine Hcl 12.5 Mg Tabs (Promethazine hcl) .... As needed 2)  Fioricet 50-325-40 Mg Tabs (Butalbital-apap-caffeine) .... As needed 3)  Fluticasone Propionate 50 Mcg/act Susp (Fluticasone propionate) .Marland KitchenMarland KitchenMarland Kitchen  1 spray two times a day as crossover technique 4)  Cymbalta 30 Mg Cpep (Duloxetine hcl) .Marland Kitchen.. 1 by mouth two times a day 5)  Altavera 0.15-30 Mg-mcg Tabs (Levonorgestrel-ethinyl estrad) .Marland Kitchen.. 1 by mouth once daily 6)  Ranitidine Hcl 150 Mg Tabs (Ranitidine hcl) .Marland Kitchen.. 1bid pre meals 7)  Vimpat 50 Mg Tabs (Lacosamide) .... Right now: 2 by mouth once daily , patient is gradually increasing per neuro 8)  Carisoprodol 350 Mg Tabs (Carisoprodol) .Marland Kitchen.. 1 by  mouth every 8 hours 9)  Cefuroxime Axetil 500 Mg Tabs (Cefuroxime axetil) .Marland Kitchen.. 1 two times a day  Patient Instructions: 1)  Report increasing pain , pus , bleeding & fever.Please keep ear dry until well. Use Fluticasone spray two times a day as needed  Prescriptions: CEFUROXIME AXETIL 500 MG TABS (CEFUROXIME AXETIL) 1 two times a day  #14 x 0   Entered and Authorized by:   Marga Melnick MD   Signed by:   Marga Melnick MD on 04/25/2010   Method used:   Print then Give to Patient   RxID:   [id number]    Orders Added: 1)  Est. Patient Level III [56213]

## 2010-05-05 NOTE — H&P (Signed)
Lorraine Weber, Lorraine Weber NO.:  [id number]  MEDICAL RECORD NO.:  [id number]           PATIENT TYPE:  I  LOCATION:  A211                          FACILITY:  [facility]  PHYSICIAN:  Lorraine Cleveland, MD       DATE OF BIRTH:  01-23-1981  DATE OF ADMISSION:  04/16/2010 DATE OF DISCHARGE:  LH                             HISTORY & PHYSICAL   PRIMARY CARE PHYSICIAN:  Lorraine Rear. Sherwood Gambler, MD  CHIEF COMPLAINT:  Migraine headache.  HISTORY OF PRESENT ILLNESS:  Lorraine Weber is a 30 year old Caucasian female who has a history of migraine headaches, which have gotten much worse following head trauma on October 31, 2008.  The head trauma was significant in cause of defect in her left visual field and she has had much more severe migraine headaches and bouts, is followed by neurologist and requires periodic admissions for hydration and treatment with mag sulfate to treat her severe intractable migraine.  PAST MEDICAL HISTORY:  Significant for von Willebrand disease with variable penetrance, migraine headaches, and history of head trauma.  PAST SURGICAL HISTORY:  Appendectomy.  MEDICATIONS: 1. She takes Altavera, which is birth control. 2. Cymbalta 30 mg twice a day. 3. Depakote 1000 mg once a day. 4. Fioricet one every 6 hours as needed. 5. Vitamin D 1.25 mg daily. 6. Phenergan 12.5 mg every 6 hours as needed for nausea, vomiting. 7. Magnesium oral 250 mg once a day. 8. Calcium oral 600 mg once a day.  SOCIAL HISTORY:  She is single.  She smokes which she started recently about half a pack a day for the past 12 months.  Denies drug or alcohol use.  LABORATORY DATA:  Urinalysis is negative, trace ketones.  Urine pregnancy is negative.  Basic metabolic panel is normal.  Complete white count is normal.  CT scan was done.  She has had several CT scans in the past.  PHYSICAL EXAMINATION:  GENERAL:  Well-developed, well-nourished, in mild distress secondary to pain from her  headache. HEAD:  Atraumatic, normocephalic. EYES:  Pupils equal, round, and reactive to light.  Disks sharp extraocular muscle.  Range of motion is full.  Sclerae anicteric and clear.  Conjunctiva is injected. EAR, NOSE, AND THROAT:  Appear normal. NECK:  Supple without jugular venous distention, thyromegaly, or thyroid mass. CHEST:  Nontender.  Normal appearance. LUNGS:  Clear to auscultation and percussion. HEART:  Regular rhythm and rate.  Normal S1 and S2 without murmur, gallop, or rub. ABDOMEN:  Soft, nontender with no hepatomegaly.  No splenomegaly.  No palpable mass. PELVIC:  Deferred. BREASTS:  Deferred. RECTAL:  Deferred. EXTREMITIES:  There is no clubbing, cyanosis, or edema. SKIN:  No unusual rash or lesions. NEUROLOGICAL:  Normal except for her left visual field loss from her head trauma.  IMPRESSION:  Intractable migraine or acute cephalgia.  PLAN:  IV hydration, IV magnesium sulfate.  We will hold her Depakote as she states she  that is not really helping her and admit to observation Lorraine Weber Team I.     Lorraine Cleveland, MD     ML/MEDQ  D:  04/17/2010  T:  04/17/2010  Job:  [id number]  cc:   Lorraine Rear. Sherwood Gambler, MD Fax: [phone]  Electronically Signed by Lorraine Weber  on 05/05/2010 02:50:10 PM

## 2010-05-07 LAB — POCT I-STAT, CHEM 8
BUN: 9 mg/dL (ref 6–23)
Calcium, Ion: 1.06 mmol/L — ABNORMAL LOW (ref 1.12–1.32)
Chloride: 110 mEq/L (ref 96–112)
Creatinine, Ser: 0.7 mg/dL (ref 0.4–1.2)
Glucose, Bld: 88 mg/dL (ref 70–99)
HCT: 43 % (ref 36.0–46.0)
Hemoglobin: 14.6 g/dL (ref 12.0–15.0)
Potassium: 4.1 mEq/L (ref 3.5–5.1)
Sodium: 140 mEq/L (ref 135–145)
TCO2: 20 mmol/L (ref 0–100)

## 2010-05-07 LAB — POCT PREGNANCY, URINE: Preg Test, Ur: NEGATIVE

## 2010-05-08 ENCOUNTER — Encounter: Payer: Self-pay | Admitting: Internal Medicine

## 2010-05-29 ENCOUNTER — Telehealth: Payer: Self-pay | Admitting: *Deleted

## 2010-05-29 NOTE — Telephone Encounter (Signed)
Left on pt machine to return call.

## 2010-05-29 NOTE — Telephone Encounter (Signed)
Spoke w/ pt says she already has refill for medication and the only reasons prior Berkley Harvey was generated was because insurance requires 90-day supply.

## 2010-06-26 NOTE — Op Note (Signed)
Lorraine Weber, BROSIOUS                ACCOUNT NO.:  [id number]   MEDICAL RECORD NO.:  [id number]          PATIENT TYPE:  AMB   LOCATION:  DAY                           FACILITY:  [facility]   PHYSICIAN:  Dalia Heading, M.D.  DATE OF BIRTH:  04-01-1980   DATE OF PROCEDURE:  01/04/2008  DATE OF DISCHARGE:                               OPERATIVE REPORT   PREOPERATIVE DIAGNOSIS:  Chronic right lower quadrant abdominal pain.   POSTOPERATIVE DIAGNOSIS:  Chronic right lower quadrant abdominal pain,  chronic appendicitis.   PROCEDURE:  Laparoscopic appendectomy.   SURGEON:  Dalia Heading, MD   ANESTHESIA:  General endotracheal.   INDICATIONS:  The patient is a 30 year old white female who presents  with chronic right lower quadrant abdominal pain.  The only thing in her  previous workup has been a thickened appendix as seen on CT scan of the  abdomen and pelvis.  The patient now comes to the operating room for  diagnostic laparoscopy and laparoscopic appendectomy.  The risks and  benefits of the procedure including bleeding and infection were fully  explained to the patient, gave informed consent.  She does have a  history of Von Willebrand disease, but her bleeding time was normal  preoperatively.   PROCEDURE NOTE:  The patient was placed in the supine position.  After  induction of general endotracheal anesthesia, the abdomen was prepped  and draped using the usual sterile technique with Betadine.  Surgical  site confirmation was performed.   An infraumbilical incision was made down to the fascia.  A Veress needle  was introduced into the abdominal cavity and confirmation of placement  was done using the saline drop test.  The abdomen was then insufflated  to 16 mmHg pressure.  An 11-mm trocar was introduced into the abdominal  cavity under direct visualization without difficulty.  The patient was  placed in deeper Trendelenburg position.  An additional 12-mm trocar was  placed in  suprapubic region and a 5-mm trocar was placed in left lower  quadrant region.  The terminal ileum was inspected and there was no  evidence of Meckel diverticulum.  There was no evidence of Crohn  disease.  Both ovaries were visualized and noted be within normal  limits.  The pelvis appeared to be clean without evidence of  endometriosis.  The patient was noted to have an elongated, tortuous,  retrocecal appendix.  The mesoappendix was freed away using the harmonic  scalpel.  A vascular Endo-GIA was placed across the base of the appendix  and fired.  The appendix was removed using an Endocatch bag.  The  specimen was then sent to pathology for further examination.  The staple  line was inspected and noted to be within normal limits.  The right  lower quadrant was copiously irrigated with normal saline.  Surgicel was  placed in the appendiceal region to help control any postoperative  bleeding.  All fluid and air were then evacuated from the abdominal  cavity prior to removal of the trocars.   All wounds were irrigated with normal saline.  All  wounds were injected  with 0.5% Sensorcaine.  The infraumbilical fascia as well as suprapubic  fascia were reapproximated using 0 Vicryl interrupted sutures.  All skin  incisions were closed using 4-0 Vicryl subcuticular suture.  Dermabond  was then applied.   All tape and needle counts were correct at the end of the procedure.  The patient was extubated in the operating room and went back to  recovery room awake and in stable condition.   COMPLICATIONS:  None.   SPECIMEN:  Appendix.   BLOOD LOSS:  Minimal.      Dalia Heading, M.D.  Electronically Signed     MAJ/MEDQ  D:  01/04/2008  T:  01/05/2008  Job:  [id number]   cc:   Madelin Rear. Sherwood Gambler, MD  Fax: [phone]

## 2010-06-26 NOTE — H&P (Signed)
Lorraine Weber, DEARMAS                ACCOUNT NO.:  [id number]   MEDICAL RECORD NO.:  [id number]          PATIENT TYPE:  AMB   LOCATION:  DAY                           FACILITY:  [facility]   PHYSICIAN:  Dalia Heading, M.D.  DATE OF BIRTH:  [birth year]   DATE OF ADMISSION:  DATE OF DISCHARGE:  LH                              HISTORY & PHYSICAL   CHIEF COMPLAINT:  Right lower quadrant abdominal pain.   HISTORY OF PRESENT ILLNESS:  The patient is a 30 year old white female  who is referred for evaluation and treatment of chronic right lower  quadrant abdominal pain.  It has been present for many months now.  It  is not associated with any movement or GI symptoms.  Gynecologic workup  has been negative.  Urologic workup has been negative, except for  asymptomatic kidney stones.  CT scan of the abdomen and pelvis in the  past has shown some thickening of the appendix.   PAST MEDICAL HISTORY:  von Willebrand disease, though she has variable  penetrance as her menstrual cycles have been both normal and heavy.   PAST SURGICAL HISTORY:  Left wrist surgery.   CURRENT MEDICATIONS:  Birth control pills, Fioricet, trimethoprim, and  phenazopyridine.   ALLERGIES:  BLOOD THINNERS due to her von Willebrand disease.   REVIEW OF SYSTEMS:  The patient does smoke cigarettes daily.  She denies  any alcohol use.  She denies any other cardiopulmonary difficulties or  bleeding disorders.   PHYSICAL EXAMINATION:  GENERAL:  The patient is a well-developed, well-  nourished white female in no acute distress.  LUNGS:  Clear to auscultation with equal breath sounds bilaterally.  HEART:  A regular rate and rhythm without S3, S4, and murmurs.  ABDOMEN:  Soft and nondistended.  She is tender in the right lower  quadrant to palpation.  No hepatosplenomegaly, mass, and hernias are  identified.   IMPRESSION:  Chronic right lower quadrant abdominal pain, question  chronic appendicitis, Meckel diverticulitis,  irritable bowel disease.   PLAN:  The patient is scheduled for a diagnostic laparoscopy,  laparoscopic appendectomy on January 04, 2008.  The risks and benefits  of the procedure including bleeding, infection, the possibly recurrent  pain, blood transfusion, and the possibility of an open procedure were  fully explained to the patient, gave informed consent.  A bleeding time  will be ordered preoperatively.      Dalia Heading, M.D.  Electronically Signed     MAJ/MEDQ  D:  12/31/2007  T:  01/01/2008  Job:  [id number]   cc:   Short Stay at [facility]. Sherwood Gambler, MD  Fax: [phone]

## 2010-07-26 ENCOUNTER — Ambulatory Visit (INDEPENDENT_AMBULATORY_CARE_PROVIDER_SITE_OTHER): Payer: 59 | Admitting: Internal Medicine

## 2010-07-26 ENCOUNTER — Encounter: Payer: Self-pay | Admitting: Internal Medicine

## 2010-07-26 VITALS — BP 126/80 | HR 72 | Temp 98.4°F | Wt 124.2 lb

## 2010-07-26 DIAGNOSIS — J209 Acute bronchitis, unspecified: Secondary | ICD-10-CM

## 2010-07-26 DIAGNOSIS — L980 Pyogenic granuloma: Secondary | ICD-10-CM

## 2010-07-26 DIAGNOSIS — J0100 Acute maxillary sinusitis, unspecified: Secondary | ICD-10-CM

## 2010-07-26 DIAGNOSIS — L929 Granulomatous disorder of the skin and subcutaneous tissue, unspecified: Secondary | ICD-10-CM

## 2010-07-26 MED ORDER — AMOXICILLIN-POT CLAVULANATE 500-125 MG PO TABS: 1.0000 | ORAL_TABLET | Freq: Two times a day (BID) | ORAL | Status: AC

## 2010-07-26 MED ORDER — HYDROCODONE-HOMATROPINE 5-1.5 MG/5ML PO SYRP: 5.0000 mL | ORAL_SOLUTION | Freq: Four times a day (QID) | ORAL | Status: AC | PRN

## 2010-07-26 NOTE — Patient Instructions (Addendum)
Plain Mucinex for thick secretions ; force NON dairy fluids for next 48 hrs . Employ birth control while on Augmentin. Consider Plastic Surgery if the papule persists after Augmentin

## 2010-07-26 NOTE — Progress Notes (Signed)
  Subjective:    Patient ID: Lorraine Weber, female    DOB: 28-Sep-1980, 30 y.o.   MRN: [id number]  HPI Respiratory tract infection Onset/symptoms:1week ago as low grade temp with myalgias & arthralgias  & ST Exposures (illness/environmental/extrinsic):sick cousin Progression of symptoms:worsening cough Treatments/response:Benadryl, Nyquil & Dayquil, Neti pot Present symptoms:productive cough Fever/chills/sweats:marked sweating Frontal headache:yes Facial pain:yes Nasal purulence:yes Sore throat:yes Dental pain:upper teeth Lymphadenopathy:yes Wheezing/shortness of breath:yes Cough/sputum/hemoptysis:purulent secretions Pleuritic pain:posteriorly Associated extrinsic/allergic symptoms:itchy eyes/ sneezing:no Past medical history: Seasonal allergies: no;asthma:no Smoking history:1/2 cig/ day           Review of Systems Nasal lesion X 3 mos, 2 days after Botox     Objective:   Physical Exam General appearance is of good health and nourishment; no acute distress or increased work of breathing is present.  Shotty posteror  lymphadenopathy about the  Neck, no axillary LA  Eyes: No conjunctival inflammation or lid edema is present. There is no scleral icterus.  Ears:  External ear exam shows no significant lesions or deformities.  Otoscopic examination reveals clear canals, tympanic membranes are intact bilaterally without bulging, retraction, inflammation or discharge.  Nose:  External nasal examination shows no deformity or inflammation. Nasal mucosa are pink and moist without lesions or exudates. No septal dislocation or dislocation.No obstruction to airflow.  Hyponasal speech  Oral exam: Dental hygiene is good; lips and gums are healthy appearing.There is no oropharyngeal erythema or exudate noted.   Neck:  No deformities, thyromegaly, masses, or tenderness noted.   Supple with full range of motion without pain.   Heart:  Normal rate and regular rhythm. S1 and S2 normal  without gallop, murmur, click, rub or other extra sounds.   Lungs:Chest clear to auscultation; no wheezes, rhonchi,rales ,or rubs present.No increased work of breathing.    Extremities:  No cyanosis, edema, or clubbing  noted    Skin: Warm & dry w/o jaundice or tenting.5X5 mm papule/ granuloma  @ nose          Assessment & Plan:  #1sinusitis #2 bronchitis # granuloma vs keloid Plan: see Orders

## 2010-08-03 ENCOUNTER — Emergency Department (HOSPITAL_COMMUNITY): Payer: 59

## 2010-08-03 ENCOUNTER — Encounter (HOSPITAL_COMMUNITY): Payer: Self-pay | Admitting: Radiology

## 2010-08-03 ENCOUNTER — Encounter: Payer: Self-pay | Admitting: Internal Medicine

## 2010-08-03 ENCOUNTER — Ambulatory Visit (INDEPENDENT_AMBULATORY_CARE_PROVIDER_SITE_OTHER): Payer: 59 | Admitting: Internal Medicine

## 2010-08-03 ENCOUNTER — Emergency Department (HOSPITAL_COMMUNITY)
Admission: EM | Admit: 2010-08-03 | Discharge: 2010-08-03 | Disposition: A | Discharge status: Home / Self Care | Payer: 59 | Attending: Emergency Medicine | Admitting: Emergency Medicine

## 2010-08-03 DIAGNOSIS — R220 Localized swelling, mass and lump, head: Secondary | ICD-10-CM | POA: Insufficient documentation

## 2010-08-03 DIAGNOSIS — L03211 Cellulitis of face: Secondary | ICD-10-CM

## 2010-08-03 DIAGNOSIS — D6800 Von Willebrand disease, unspecified: Secondary | ICD-10-CM

## 2010-08-03 DIAGNOSIS — B9562 Methicillin resistant Staphylococcus aureus infection as the cause of diseases classified elsewhere: Secondary | ICD-10-CM | POA: Insufficient documentation

## 2010-08-03 DIAGNOSIS — R51 Headache: Secondary | ICD-10-CM | POA: Insufficient documentation

## 2010-08-03 DIAGNOSIS — Z9889 Other specified postprocedural states: Secondary | ICD-10-CM | POA: Insufficient documentation

## 2010-08-03 DIAGNOSIS — L0201 Cutaneous abscess of face: Secondary | ICD-10-CM

## 2010-08-03 DIAGNOSIS — R7881 Bacteremia: Secondary | ICD-10-CM

## 2010-08-03 DIAGNOSIS — A4902 Methicillin resistant Staphylococcus aureus infection, unspecified site: Secondary | ICD-10-CM

## 2010-08-03 DIAGNOSIS — Z79899 Other long term (current) drug therapy: Secondary | ICD-10-CM | POA: Insufficient documentation

## 2010-08-03 HISTORY — DX: Migraine, unspecified, not intractable, without status migrainosus: G43.909

## 2010-08-03 HISTORY — DX: Von Willebrand's disease: D68.0

## 2010-08-03 HISTORY — DX: Von Willebrand disease, unspecified: D68.00

## 2010-08-03 LAB — BASIC METABOLIC PANEL
BUN: 7 mg/dL (ref 6–23)
CO2: 26 mEq/L (ref 19–32)
Calcium: 8.9 mg/dL (ref 8.4–10.5)
Chloride: 103 mEq/L (ref 96–112)
Creatinine, Ser: 0.5 mg/dL (ref 0.50–1.10)
GFR calc Af Amer: >60 mL/min (ref >60)
GFR calc non Af Amer: >60 mL/min (ref >60)
Glucose, Bld: 82 mg/dL (ref 70–99)
Potassium: 4.2 mEq/L (ref 3.5–5.1)
Sodium: 138 mEq/L (ref 135–145)

## 2010-08-03 LAB — DIFFERENTIAL
Basophils Absolute: 0 10*3/uL (ref 0.0–0.1)
Basophils Relative: 0 % (ref 0–1)
Eosinophils Absolute: 0 10*3/uL (ref 0.0–0.7)
Eosinophils Relative: 0 % (ref 0–5)
Lymphocytes Relative: 21 % (ref 12–46)
Lymphs Abs: 2 10*3/uL (ref 0.7–4.0)
Monocytes Absolute: 0.7 10*3/uL (ref 0.1–1.0)
Monocytes Relative: 8 % (ref 3–12)
Neutro Abs: 6.7 10*3/uL (ref 1.7–7.7)
Neutrophils Relative %: 71 % (ref 43–77)

## 2010-08-03 LAB — CBC
HCT: 42.1 % (ref 36.0–46.0)
Hemoglobin: 14.7 g/dL (ref 12.0–15.0)
MCH: 31.8 pg (ref 26.0–34.0)
MCHC: 34.9 g/dL (ref 30.0–36.0)
MCV: 91.1 fL (ref 78.0–100.0)
Platelets: 311 10*3/uL (ref 150–400)
RBC: 4.62 MIL/uL (ref 3.87–5.11)
RDW: 16.1 % — ABNORMAL HIGH (ref 11.5–15.5)
WBC: 9.4 10*3/uL (ref 4.0–10.5)

## 2010-08-03 MED ORDER — IOHEXOL 300 MG/ML  SOLN
80.0000 mL | Freq: Once | INTRAMUSCULAR | Status: AC | PRN
  Administered 2010-08-03: 80 mL via INTRAVENOUS

## 2010-08-03 NOTE — Progress Notes (Signed)
  Subjective:    Patient ID: Lorraine Weber, female    DOB: 09-23-1980, 30 y.o.   MRN: [id number]  HPI Facial swelling & redness Onset/symptoms:06/20 Progression of symptoms:progressive erythema & swelling Treatments/response:Augmentin Rxed  1 week ago; swelling has progressed despite that Present symptoms: cnstant , throbbing pain L nasal bridge, L forehead, & OS Fever/chills/sweats:sweats & low grade fevr Nasal purulence:no Sore throat:yes Dental pain:L maxilla Lymphadenopathy:yes Wheezing/shortness of breath:no Cough/sputum/hemoptysis:minor cough  Past medical history: Botox injections (30) March & again 6/21 by Dr Estella Husk, Neurologist, [place] for migraines. "Bump" @ L nasal bridge since 2 days after injection in 3/12. MRSA L wrist 2004 with septicemia [place] , [place].            Review of Systems     Objective:   Physical Exam Gen.: Healthy and well-nourished in appearance but uncomfortable Head: Normocephalic without obvious abnormalities Eyes: No corneal or conjunctival inflammation noted. Pupils equal round reactive to light and accommodation. FOV decreased in L hemianopsia pattern. Extraocular motion decreased OS. OS ptosis with erythema. Vision grossly decreased bilaterally. Ears: External  ear exam reveals no significant lesions or deformities. Canals clear .TMs normal. Hearing is grossly normal bilaterally. Nose: External nasal exam reveals  Papular ? Pustular 3X5 mm  deformity with circumferential erythema inflammation. Nasal mucosa are  dry. No lesions or exudates noted.  Mouth: Oral mucosa and oropharynx reveal no lesions or exudates. Teeth in good repair. Neck: No deformities or  masses, but tenderness with flexion noted.  Lungs: Normal respiratory effort; chest expands symmetrically. Lungs are clear to auscultation without rales, wheezes, or increased work of breathing. Heart:Tachycardia with regular  rhythm. Normal S1 and S2. Slight  gallop, click, or rub.  No  murmur. Abdomen: Bowel sounds normal; abdomen soft and nontender. No masses, organomegaly or hernias noted.  No clubbing, cyanosis, edema, or deformity noted. .Joints normal. Nail health  Good. Op scar L wrist well healed Vascular: Carotid, radial artery, dorsalis pedis and dorsalis posterior tibial pulses are full and equal. No bruits present. Neurologic: Alert and oriented x3. Deep tendon reflexes symmetrical and normal.          Skin:see EYE Lymph: No  Axillary  lymphadenopathy present. Shotty L cervical LA Psych: Mood and affect are flat but appropriate. Normally interactive                                                                                         Assessment & Plan:  #1 facial cellulitis ; R/O ORBITAL CELLULITIS  #2PMH of MRSA bacteremia #3 intractable headaches, post traumatic, S/P BOTOX  Injections Plan: ER ; CT scan,  ICU placement with broad spectrum antibiotics may be  indicated

## 2010-08-03 NOTE — Patient Instructions (Signed)
To Ramos ; I shall call Triage  Nurse

## 2010-08-07 ENCOUNTER — Encounter: Payer: Self-pay | Admitting: Internal Medicine

## 2010-08-08 ENCOUNTER — Encounter: Payer: Self-pay | Admitting: Internal Medicine

## 2010-08-08 ENCOUNTER — Ambulatory Visit (INDEPENDENT_AMBULATORY_CARE_PROVIDER_SITE_OTHER): Payer: 59 | Admitting: Internal Medicine

## 2010-08-08 DIAGNOSIS — L0291 Cutaneous abscess, unspecified: Secondary | ICD-10-CM

## 2010-08-08 DIAGNOSIS — R000 Tachycardia, unspecified: Secondary | ICD-10-CM

## 2010-08-08 DIAGNOSIS — R509 Fever, unspecified: Secondary | ICD-10-CM

## 2010-08-08 NOTE — Progress Notes (Signed)
  Subjective:    Patient ID: Lorraine Weber, female    DOB: 11-09-1980, 30 y.o.   MRN: [id number]  HPI Dr Fonnie Jarvis performed I&D of lesion ; infected cyst /follicle diagnosed. Bactrim DS & Keflex Rxed. Since ER visit she has had intermittent fever "as a flash " with profuse diaphoresis. , up to 102. Last temp elevation was last night to 101.   The Review of Systems   She describes nasal congestion/obstruction; nasal purulence; facial pain; sore throat; fatigue; frontal  headache;L  earache and dental pain ( L jaw).  She denies anosmia or halitosis    Objective:   Physical Exam General appearance: no acute distress or increased work of breathing is present. Thin. Slight  lymphadenopathy @ the L anterior neck ; no  Axillary LA  noted.   Eyes: No conjunctival inflammation or lid edema is present. There is no scleral icterus.Ptosis OS  Ears:  External ear exam shows no significant lesions or deformities.  Otoscopic examination reveals clear canals, tympanic membranes are intact bilaterally without bulging, retraction, inflammation or discharge.  Nose:  External nasal examination shows no deformity or inflammation. Nasal mucosa are pink and moist without lesions or exudates. No septal dislocation or dislocation.No obstruction to airflow.   Oral exam: Dental hygiene is good; lips and gums are healthy appearing.There is no oropharyngeal erythema or exudate noted.   Neck:  Supple with full range of motion without pain.   Heart:  Normal rate and regular rhythm. S1 and S2 normal with  gallop, murmur. No click, rub or other extra sounds.   Lungs:Chest clear to auscultation; no wheezes, rhonchi,rales ,or rubs present.No increased work of breathing.    Extremities:  No cyanosis, edema, or clubbing  noted    Skin: Warm & dry w/o jaundice or tenting. Slight eschar @ I&D site         Assessment & Plan:  #1 abscess , S/P I&D #2 recurrent fever # 3 PMH of MRSA #4 tachycardia Plan: CBC  & dif,  TFT, BMET ID consult

## 2010-08-08 NOTE — Patient Instructions (Signed)
Tylenol every 4 hrs as needed for fever as discussed based on label recommendations . Keep a Temperature Diary as discussed

## 2010-08-09 LAB — CBC WITH DIFFERENTIAL/PLATELET
Basophils Absolute: 0 10*3/uL (ref 0.0–0.1)
Basophils Relative: 0.1 % (ref 0.0–3.0)
Eosinophils Absolute: 0 10*3/uL (ref 0.0–0.7)
Eosinophils Relative: 0.7 % (ref 0.0–5.0)
HCT: 44.2 % (ref 36.0–46.0)
Hemoglobin: 14.9 g/dL (ref 12.0–15.0)
Lymphocytes Relative: 29 % (ref 12.0–46.0)
Lymphs Abs: 1.4 10*3/uL (ref 0.7–4.0)
MCHC: 33.6 g/dL (ref 30.0–36.0)
MCV: 96.4 fl (ref 78.0–100.0)
Monocytes Absolute: 0.5 10*3/uL (ref 0.1–1.0)
Monocytes Relative: 10.7 % (ref 3.0–12.0)
Neutro Abs: 2.9 10*3/uL (ref 1.4–7.7)
Neutrophils Relative %: 59.5 % (ref 43.0–77.0)
Platelets: 269 10*3/uL (ref 150.0–400.0)
RBC: 4.59 Mil/uL (ref 3.87–5.11)
RDW: 17.1 % — ABNORMAL HIGH (ref 11.5–14.6)
WBC: 4.9 10*3/uL (ref 4.5–10.5)

## 2010-08-09 LAB — BASIC METABOLIC PANEL
BUN: 10 mg/dL (ref 6–23)
CO2: 24 mEq/L (ref 19–32)
Calcium: 9.4 mg/dL (ref 8.4–10.5)
Chloride: 103 mEq/L (ref 96–112)
Creatinine, Ser: 0.6 mg/dL (ref 0.4–1.2)
GFR: 123 mL/min (ref >60.00)
Glucose, Bld: 77 mg/dL (ref 70–99)
Potassium: 4.4 mEq/L (ref 3.5–5.1)
Sodium: 134 mEq/L — ABNORMAL LOW (ref 135–145)

## 2010-08-09 LAB — MAGNESIUM: Magnesium: 2.2 mg/dL (ref 1.5–2.5)

## 2010-08-09 LAB — TSH: TSH: 0.85 u[IU]/mL (ref 0.35–5.50)

## 2010-08-09 LAB — T4, FREE: Free T4: 0.74 ng/dL (ref 0.60–1.60)

## 2010-08-14 ENCOUNTER — Encounter: Payer: Self-pay | Admitting: Infectious Diseases

## 2010-08-14 ENCOUNTER — Ambulatory Visit (INDEPENDENT_AMBULATORY_CARE_PROVIDER_SITE_OTHER): Payer: 59 | Admitting: Infectious Diseases

## 2010-08-14 VITALS — BP 157/97 | HR 99 | Temp 98.2°F | Ht 64.0 in | Wt 123.0 lb

## 2010-08-14 DIAGNOSIS — L03213 Periorbital cellulitis: Secondary | ICD-10-CM

## 2010-08-14 DIAGNOSIS — H00039 Abscess of eyelid unspecified eye, unspecified eyelid: Secondary | ICD-10-CM

## 2010-08-14 NOTE — Assessment & Plan Note (Signed)
The etiology of this is unclear. We will send her for a MRI of her orbits to re-eval the source of d/c. Would querry if she has a fistula into her lacrimal duct? She will continue on her doxy. Will see her back in 1 week. Possible etiologies at this point would include MRSA, propionibacter, non-TB mycobacteria. She does not appear toxic enough to warrant hospitalization. She agrees with this plan.

## 2010-08-14 NOTE — Progress Notes (Signed)
  Subjective:    Patient ID: Lorraine Weber, female    DOB: Jun 02, 1980, 30 y.o.   MRN: [id number]  HPI 30 yo F with a 3 month hx of a bump on her L face, upper nose. States that after last bo-tox injection for migraine control). She had been on Augmentin at time of this lesion due to a bronchial infection. She was seen in ED on August 03, 2010 and had I & D. She had normal WBC, CT of head ( soft tissue stranding, preseptal cellulitis. No underlying osseous abnormality). She was having daily fever (up to 102.5)and was sent here. When d/c from ED she was given a rx for a sulfa drug as well as a cephalosporin. Wound continues to hurt. Still draining (yellow, thick d/c). Having night sweats.  Finnished anbx on 08-10-10 and was started on doxycycline.   Review of Systems  Constitutional: Negative for unexpected weight change.  Gastrointestinal: Negative for diarrhea and constipation.  Genitourinary: Negative for difficulty urinating.  Neurological: Positive for headaches.  Hematological: Positive for adenopathy.   L hemonymous heminopsia. Vision unchanged recently. Has some swelling on L neck, mild dysphagia.     Objective:   Physical Exam        Assessment & Plan:

## 2010-08-21 ENCOUNTER — Ambulatory Visit (HOSPITAL_COMMUNITY)
Admission: RE | Admit: 2010-08-21 | Discharge: 2010-08-21 | Disposition: A | Discharge status: Home / Self Care | Payer: 59 | Source: Ambulatory Visit | Attending: Infectious Diseases | Admitting: Infectious Diseases

## 2010-08-21 DIAGNOSIS — Z09 Encounter for follow-up examination after completed treatment for conditions other than malignant neoplasm: Secondary | ICD-10-CM | POA: Insufficient documentation

## 2010-08-21 DIAGNOSIS — L03213 Periorbital cellulitis: Secondary | ICD-10-CM

## 2010-08-21 DIAGNOSIS — H05019 Cellulitis of unspecified orbit: Secondary | ICD-10-CM | POA: Insufficient documentation

## 2010-08-21 DIAGNOSIS — J3489 Other specified disorders of nose and nasal sinuses: Secondary | ICD-10-CM | POA: Insufficient documentation

## 2010-08-21 MED ORDER — GADOBENATE DIMEGLUMINE 529 MG/ML IV SOLN
12.0000 mL | Freq: Once | INTRAVENOUS | Status: AC
  Administered 2010-08-21: 12 mL via INTRAVENOUS

## 2010-08-21 NOTE — Progress Notes (Signed)
Addended by: Starleen Arms D on: 08/21/2010 09:34 AM   Modules accepted: Orders

## 2010-08-22 ENCOUNTER — Encounter: Payer: Self-pay | Admitting: Infectious Diseases

## 2010-08-22 ENCOUNTER — Ambulatory Visit (INDEPENDENT_AMBULATORY_CARE_PROVIDER_SITE_OTHER): Payer: 59 | Admitting: Infectious Diseases

## 2010-08-22 DIAGNOSIS — L03213 Periorbital cellulitis: Secondary | ICD-10-CM

## 2010-08-22 DIAGNOSIS — H00039 Abscess of eyelid unspecified eye, unspecified eyelid: Secondary | ICD-10-CM

## 2010-08-22 DIAGNOSIS — B3731 Acute candidiasis of vulva and vagina: Secondary | ICD-10-CM | POA: Insufficient documentation

## 2010-08-22 DIAGNOSIS — B373 Candidiasis of vulva and vagina: Secondary | ICD-10-CM

## 2010-08-22 MED ORDER — FLUCONAZOLE 100 MG PO TABS: 100.0000 mg | ORAL_TABLET | Freq: Every day | ORAL | Status: AC

## 2010-08-22 NOTE — Progress Notes (Signed)
  Subjective:    Patient ID: Lorraine Weber, female    DOB: 06/30/1980, 30 y.o.   MRN: [id number]  HPI 30 yo F with a 3 month hx of a bump on her L face, upper nose. States that after last bo-tox injection for migraine control). She had been on Augmentin at time of this lesion due to a bronchial infection. She was seen in ED on August 03, 2010 and had I & D. She had normal WBC, CT of head ( soft tissue stranding, preseptal cellulitis. No underlying osseous abnormality). She was having daily fever (up to 102.5)and was sent here. When d/c from ED she was given a rx for a sulfa drug as well as a cephalosporin. Wound continues to hurt. Still draining (yellow, thick d/c). Having night sweats.  Finnished anbx on 08-10-10 and was started on doxycycline.  She was seen in ID 08-14-10 and continued on her doxy, sent for imaging.  She underwent MRI 08-21-10: When compared to the prior CT scan, decrease (although incomplete  clearance) of the inflammation involving the left preseptal region.  There remains minimal infiltration along the medial aspect of the  left orbit without a well-defined abscess. Has been having temps to 102 everyday between 2 and 4 pm as well as having night sweats. Still having swelling along nasal bridge. Feels like she has swelling in her neck as well. Has had some problems with yeast infections while on the doxy. Tried OTC without relief.   Review of Systems     Objective:   Physical Exam  Eyes: EOM are normal. Pupils are equal, round, and reactive to light.    Neck: Neck supple.            Assessment & Plan:

## 2010-08-22 NOTE — Assessment & Plan Note (Signed)
She appears improved. Will continue her on doxy for another 2 weeks and see her back at that time. Have asked her to call if she has any worsening in the intervening period- vision change, increased pain. Would like to see her fever curve at return visit. Could consider repeat MRI at repeat visit if not improving.

## 2010-08-22 NOTE — Assessment & Plan Note (Signed)
Will give her 1 week of diflucan, with  Refill to try and get her through her anbx course.

## 2010-09-03 ENCOUNTER — Other Ambulatory Visit: Payer: Self-pay | Admitting: Licensed Clinical Social Worker

## 2010-09-03 DIAGNOSIS — L989 Disorder of the skin and subcutaneous tissue, unspecified: Secondary | ICD-10-CM

## 2010-09-03 MED ORDER — DOXYCYCLINE HYCLATE 100 MG PO CPEP: 100.0000 mg | ORAL_CAPSULE | Freq: Two times a day (BID) | ORAL | Status: DC

## 2010-09-10 ENCOUNTER — Encounter: Payer: Self-pay | Admitting: Infectious Diseases

## 2010-09-10 ENCOUNTER — Ambulatory Visit (INDEPENDENT_AMBULATORY_CARE_PROVIDER_SITE_OTHER): Payer: 59 | Admitting: Infectious Diseases

## 2010-09-10 DIAGNOSIS — H00039 Abscess of eyelid unspecified eye, unspecified eyelid: Secondary | ICD-10-CM

## 2010-09-10 DIAGNOSIS — L03213 Periorbital cellulitis: Secondary | ICD-10-CM

## 2010-09-10 NOTE — Assessment & Plan Note (Signed)
She appears to be doing well. Will finnish her doxy soon. I have asked her to keep a fever log and let me know if her temps are not improved with tylenol. i also suggested she could try an otc (zyrtec or claritin) for her allergy sx. She has very mild inflammation of her inferior conjunctiva. Will see her back prn.

## 2010-09-10 NOTE — Progress Notes (Signed)
  Subjective:    Patient ID: Lorraine Weber, female    DOB: Jan 31, 1981, 30 y.o.   MRN: [id number]  HPI 30 yo F with a 3 month hx of a bump on her L face, upper nose. States that lesion started after last bo-tox injection (for migraine control). She had been on Augmentin at time of this lesion due to a bronchial infection. She was seen in ED on August 03, 2010 and had I & D. She had normal WBC, CT of head ( soft tissue stranding, preseptal cellulitis. No underlying osseous abnormality). She was having daily fever (up to 102.5)and was sent to ID. When d/c from ED she was given a rx for a sulfa drug as well as a cephalosporin. Finnished anbx on 08-10-10 and was started on doxycycline. She was seen in ID 08-14-10 and continued on her doxy.  She underwent MRI 08-21-10: When compared to the prior CT scan, decrease (although incomplete  clearance) of the inflammation involving the left preseptal region. There remains minimal infiltration along the medial aspect of the  left orbit without a well-defined abscess.  Today states that she still has fevers- 101 in the afternoon- resolves quickly with tylenol. He vision is unchanged from her baseline (was prev abn from a trauma). She has not yet completed her doxycycline. She has had the feeling of swelling in her infra-orbital region. She has a mild sore throat, she has some sinus d/c but no rhinnorhea, no sneezing.     Review of Systems     Objective:   Physical Exam  Constitutional: She appears well-developed and well-nourished.  Eyes:            Assessment & Plan:   No problem-specific assessment & plan notes found for this encounter.

## 2010-09-19 ENCOUNTER — Ambulatory Visit: Payer: 59 | Admitting: Infectious Diseases

## 2010-11-14 LAB — BASIC METABOLIC PANEL
BUN: 9
CO2: 26
Calcium: 9
Chloride: 102
Creatinine, Ser: 0.66
GFR calc Af Amer: >60
GFR calc non Af Amer: >60
Glucose, Bld: 92
Potassium: 4.3
Sodium: 136

## 2010-11-14 LAB — CROSSMATCH
ABO/RH(D): O NEG
Antibody Screen: NEGATIVE

## 2010-11-14 LAB — CBC
HCT: 40.9
Hemoglobin: 13.8
MCHC: 33.6
MCV: 90.7
Platelets: 278
RBC: 4.51
RDW: 15.1
WBC: 6.3

## 2010-11-14 LAB — HCG, QUANTITATIVE, PREGNANCY: hCG, Beta Chain, Quant, S: <2

## 2010-11-14 LAB — BLEEDING TIME: Bleeding Time: 4

## 2010-11-14 LAB — ABO/RH: ABO/RH(D): O NEG

## 2010-11-22 ENCOUNTER — Emergency Department (HOSPITAL_COMMUNITY)
Admission: EM | Admit: 2010-11-22 | Discharge: 2010-11-22 | Disposition: A | Discharge status: Home / Self Care | Payer: 59 | Attending: Emergency Medicine | Admitting: Emergency Medicine

## 2010-11-22 ENCOUNTER — Emergency Department (HOSPITAL_COMMUNITY): Payer: 59

## 2010-11-22 DIAGNOSIS — R55 Syncope and collapse: Secondary | ICD-10-CM | POA: Insufficient documentation

## 2010-11-22 DIAGNOSIS — G8929 Other chronic pain: Secondary | ICD-10-CM | POA: Insufficient documentation

## 2010-11-22 DIAGNOSIS — D6800 Von Willebrand disease, unspecified: Secondary | ICD-10-CM | POA: Insufficient documentation

## 2010-11-22 DIAGNOSIS — M25539 Pain in unspecified wrist: Secondary | ICD-10-CM | POA: Insufficient documentation

## 2010-11-22 DIAGNOSIS — Z79899 Other long term (current) drug therapy: Secondary | ICD-10-CM | POA: Insufficient documentation

## 2010-11-22 DIAGNOSIS — M25519 Pain in unspecified shoulder: Secondary | ICD-10-CM | POA: Insufficient documentation

## 2010-11-22 LAB — DIFFERENTIAL
Basophils Absolute: 0 10*3/uL (ref 0.0–0.1)
Basophils Relative: 0 % (ref 0–1)
Eosinophils Absolute: 0 10*3/uL (ref 0.0–0.7)
Eosinophils Relative: 0 % (ref 0–5)
Lymphocytes Relative: 34 % (ref 12–46)
Lymphs Abs: 2.3 10*3/uL (ref 0.7–4.0)
Monocytes Absolute: 0.6 10*3/uL (ref 0.1–1.0)
Monocytes Relative: 9 % (ref 3–12)
Neutro Abs: 3.8 10*3/uL (ref 1.7–7.7)
Neutrophils Relative %: 56 % (ref 43–77)

## 2010-11-22 LAB — BASIC METABOLIC PANEL
BUN: 10 mg/dL (ref 6–23)
CO2: 26 mEq/L (ref 19–32)
Calcium: 8.8 mg/dL (ref 8.4–10.5)
Chloride: 105 mEq/L (ref 96–112)
Creatinine, Ser: 0.67 mg/dL (ref 0.50–1.10)
GFR calc Af Amer: >90 mL/min (ref >90)
GFR calc non Af Amer: >90 mL/min (ref >90)
Glucose, Bld: 90 mg/dL (ref 70–99)
Potassium: 4 mEq/L (ref 3.5–5.1)
Sodium: 139 mEq/L (ref 135–145)

## 2010-11-22 LAB — URINALYSIS, ROUTINE W REFLEX MICROSCOPIC
Bilirubin Urine: NEGATIVE
Glucose, UA: NEGATIVE mg/dL
Hgb urine dipstick: NEGATIVE
Ketones, ur: NEGATIVE mg/dL
Nitrite: NEGATIVE
Protein, ur: NEGATIVE mg/dL
Specific Gravity, Urine: 1.018 (ref 1.005–1.030)
Urobilinogen, UA: 1 mg/dL (ref 0.0–1.0)
pH: 6.5 (ref 5.0–8.0)

## 2010-11-22 LAB — CBC
HCT: 39.1 % (ref 36.0–46.0)
Hemoglobin: 13.4 g/dL (ref 12.0–15.0)
MCH: 31.4 pg (ref 26.0–34.0)
MCHC: 34.3 g/dL (ref 30.0–36.0)
MCV: 91.6 fL (ref 78.0–100.0)
Platelets: 301 10*3/uL (ref 150–400)
RBC: 4.27 MIL/uL (ref 3.87–5.11)
RDW: 15.9 % — ABNORMAL HIGH (ref 11.5–15.5)
WBC: 6.8 10*3/uL (ref 4.0–10.5)

## 2010-11-22 LAB — URINE MICROSCOPIC-ADD ON

## 2010-11-22 LAB — POCT PREGNANCY, URINE: Preg Test, Ur: NEGATIVE

## 2010-12-31 ENCOUNTER — Ambulatory Visit (INDEPENDENT_AMBULATORY_CARE_PROVIDER_SITE_OTHER): Payer: 59 | Admitting: Internal Medicine

## 2010-12-31 ENCOUNTER — Encounter: Payer: Self-pay | Admitting: Internal Medicine

## 2010-12-31 VITALS — BP 114/78 | HR 102 | Temp 98.2°F | Wt 132.0 lb

## 2010-12-31 DIAGNOSIS — J029 Acute pharyngitis, unspecified: Secondary | ICD-10-CM

## 2010-12-31 DIAGNOSIS — J019 Acute sinusitis, unspecified: Secondary | ICD-10-CM

## 2010-12-31 DIAGNOSIS — J209 Acute bronchitis, unspecified: Secondary | ICD-10-CM

## 2010-12-31 LAB — POCT RAPID STREP A (OFFICE): Rapid Strep A Screen: NEGATIVE

## 2010-12-31 MED ORDER — CEFUROXIME AXETIL 500 MG PO TABS: 500.0000 mg | ORAL_TABLET | Freq: Two times a day (BID) | ORAL | Status: AC

## 2010-12-31 NOTE — Patient Instructions (Signed)
Zicam Melts or Zinc lozenges as needed for sore throat. Report fever, exudate("pus") or progressive pain. Plain Mucinex for thick secretions ;force NON dairy fluids. Use a Neti pot daily as needed for sinus congestion

## 2010-12-31 NOTE — Progress Notes (Signed)
  Subjective:    Patient ID: Lorraine Weber, female    DOB: [birth year], 30 y.o.   MRN: [id number]  HPI Respiratory tract infection Onset/symptoms:10 days ago as rhinitis, ST,  & myalgias & althragias Exposures (illness/environmental/extrinsic):family ill  Progression of symptoms:to facial pain & productive cough Treatments/response:Mucinex , Cold & Flu OTC Present symptoms: Fever/chills/sweats:yes Frontal headache:yes Facial pain:yes Nasal purulence:yes Dental pain:maxillary teeth Lymphadenopathy:yes Wheezing/shortness of breath:no Cough/sputum/hemoptysis:yellow / green , > volume from head  Past medical history: Seasonal allergies; no/asthma:no Smoking history:occasionally with stress           Review of Systems     Objective:   Physical Exam General appearance: thin but in  good health and nourishment; no acute distress or increased work of breathing is present.  Shotty cervical  lymphadenopathy ; none in  axilla noted.   Eyes: No conjunctival inflammation or lid edema is present. Slight ptosis.  Ears:  External ear exam shows no significant lesions or deformities.  Otoscopic examination reveals clear canals, tympanic membranes are intact bilaterally without bulging, retraction, inflammation or discharge.  Nose:  External nasal examination shows no deformity or inflammation. Nasal mucosa are  dry without lesions or exudates. No septal dislocation or dislocation.No obstruction to airflow.  Hyponasal speech  Oral exam: Dental hygiene is good; lips and gums are healthy appearing.There is no oropharyngeal erythema but exudates bilaterally   Neck:   Supple with full range of motion without pain.   Heart:  Normal rate and regular rhythm. S1 and S2 normal without gallop, murmur, click, rub or other extra sounds.   Lungs:Chest clear to auscultation; no wheezes, rhonchi,rales ,or rubs present.No increased work of breathing.    Extremities:  No cyanosis, edema, or clubbing   noted    Skin: Warm & dry .          Assessment & Plan:  #1 rhinosinusitis with tonsillar exudates  #2 bronchitis versus cough related to postnasal drainage the purulent secretions  Plan: See orders and recommendations

## 2011-01-21 ENCOUNTER — Encounter: Payer: Self-pay | Admitting: Internal Medicine

## 2011-01-21 ENCOUNTER — Ambulatory Visit (INDEPENDENT_AMBULATORY_CARE_PROVIDER_SITE_OTHER): Payer: 59 | Admitting: Internal Medicine

## 2011-01-21 VITALS — BP 122/84 | HR 112 | Temp 99.2°F | Wt 130.0 lb

## 2011-01-21 DIAGNOSIS — B9789 Other viral agents as the cause of diseases classified elsewhere: Secondary | ICD-10-CM

## 2011-01-21 DIAGNOSIS — B349 Viral infection, unspecified: Secondary | ICD-10-CM

## 2011-01-21 DIAGNOSIS — R300 Dysuria: Secondary | ICD-10-CM

## 2011-01-21 LAB — POCT URINALYSIS DIPSTICK
Bilirubin, UA: NEGATIVE
Glucose, UA: NEGATIVE
Ketones, UA: NEGATIVE
Leukocytes, UA: NEGATIVE
Nitrite, UA: NEGATIVE
Protein, UA: 30
Spec Grav, UA: <=1.005
Urobilinogen, UA: 0.2
pH, UA: 6.5

## 2011-01-21 MED ORDER — HYDROCODONE-HOMATROPINE 5-1.5 MG/5ML PO SYRP: 5.0000 mL | ORAL_SOLUTION | Freq: Four times a day (QID) | ORAL | Status: AC | PRN

## 2011-01-21 MED ORDER — AMOXICILLIN-POT CLAVULANATE 500-125 MG PO TABS: 1.0000 | ORAL_TABLET | Freq: Three times a day (TID) | ORAL | Status: AC

## 2011-01-21 NOTE — Patient Instructions (Signed)
Rest, fluids , tylenol For cough, take Mucinex DM twice a day as needed  If the cough continue, take hydrocodone (will cause drowsiness) Take the antibiotic as prescribed ----> Augmentin x 10 days, may interfere with the BCP , use additional precautions  Call if no better in few days Call anytime if the symptoms are severe

## 2011-01-21 NOTE — Progress Notes (Signed)
  Subjective:    Patient ID: Lorraine Weber, female    DOB: Apr 06, 1980, 30 y.o.   MRN: [id number]  HPI Symptoms started approximately 4 days ago ---> cough, sore throat, aches. She is taking Tylenol. Has not have a flu shot this year.   PMH GERD Migraines Von Willebrand   Review of Systems Medication list reviewed. She is having fevers mostly at night, despite Tylenol temperature has been as high as 102. Some nausea and vomiting but no diarrhea. No hemoptysis. She has a history of migraines, she has a headache, global HA reminds her of her migraines but also she has a frontal headache which is different. Denies any blood in the urine but admits to dysuria for one day. Not on her period     Objective:   Physical Exam  Constitutional: She appears well-developed and well-nourished.       Persistent cough noted   HENT:  Head: Atraumatic.       Left eye lid weakness, not a new finding per patient. Nose is slightly congested, ears normal, throat without redness or discharge  Neck: Normal range of motion. Neck supple.  Cardiovascular:       Tachycardic  without murmur  Pulmonary/Chest: Breath sounds normal. No respiratory distress. She has no wheezes. She has no rales.  Abdominal: Soft. Bowel sounds are normal.       Not distended, soft, mildly tender at the left lower abdomen. No mass or rebound.  Musculoskeletal: She exhibits no edema.          Assessment & Plan:  Viral syndrome: Sx c/w viral syndrome, she also has some evidence of sinusitis. udip showed some blood, she is not on her period, lower abdomen. She admits to some dysuria in the last day---->  ?UTI Plan: See instructions augmentin ucx

## 2011-01-23 LAB — URINE CULTURE: Colony Count: 8000

## 2011-02-07 DIAGNOSIS — F0781 Postconcussional syndrome: Secondary | ICD-10-CM

## 2011-04-26 ENCOUNTER — Emergency Department (HOSPITAL_COMMUNITY)
Admission: EM | Admit: 2011-04-26 | Discharge: 2011-04-26 | Disposition: A | Discharge status: Home / Self Care | Payer: 59 | Attending: Emergency Medicine | Admitting: Emergency Medicine

## 2011-04-26 ENCOUNTER — Encounter (HOSPITAL_COMMUNITY): Payer: Self-pay

## 2011-04-26 DIAGNOSIS — Z79899 Other long term (current) drug therapy: Secondary | ICD-10-CM | POA: Insufficient documentation

## 2011-04-26 DIAGNOSIS — D6800 Von Willebrand disease, unspecified: Secondary | ICD-10-CM | POA: Insufficient documentation

## 2011-04-26 DIAGNOSIS — F17200 Nicotine dependence, unspecified, uncomplicated: Secondary | ICD-10-CM | POA: Insufficient documentation

## 2011-04-26 DIAGNOSIS — D649 Anemia, unspecified: Secondary | ICD-10-CM | POA: Insufficient documentation

## 2011-04-26 DIAGNOSIS — J329 Chronic sinusitis, unspecified: Secondary | ICD-10-CM | POA: Insufficient documentation

## 2011-04-26 HISTORY — DX: Unspecified mastoiditis, unspecified ear: H70.90

## 2011-04-26 MED ORDER — CEFUROXIME AXETIL 500 MG PO TABS: 500.0000 mg | ORAL_TABLET | Freq: Two times a day (BID) | ORAL | Status: AC

## 2011-04-26 NOTE — ED Provider Notes (Signed)
History     CSN: [id number]  Arrival date & time 04/26/11  1703   First MD Initiated Contact with Patient 04/26/11 1920      Chief Complaint  Patient presents with  . Facial Swelling    (Consider location/radiation/quality/duration/timing/severity/associated sxs/prior treatment) HPI Comments: Lorraine Weber is a 31 y.o. female with nasal congestion, and pain behind her left ear for several days. She saw a doctor at an urgent care today and was told she might have mastoiditis and came here for further evaluation. She was treated by her PCP twice within the last 6 months for infection. She has not had CT or MR documentation of mastoiditis in the last 2 years. She has no nausea, vomiting, weakness, chills, fever, chest pain, cough, abdominal pain. She has been using over-the-counter medicines and her usual medicines without relief of her problem  The history is provided by the patient.    Past Medical History  Diagnosis Date  . Migraines   . Von Willebrand disease   . Anemia   . Nephrolithiasis     X5, Dr Annabell Howells  . Peptic ulcer disease   . Concussion     x8, last 2010, residual homonymous Hemianopsia  . Migraines   . Mastoiditis     Past Surgical History  Procedure Date  . Appendectomy   . Hand tendon surgery     left, complicated by MRSA    Family History  Problem Relation Age of Onset  . Arthritis Father   . Hyperlipidemia Father   . Diabetes Father   . Stroke Father     PTE post CVA  . Prostate cancer Paternal Grandfather   . Hyperlipidemia Paternal Grandfather   . Diabetes Paternal Grandfather   . Heart disease Mother   . Heart disease Maternal Grandmother   . Diabetes Maternal Grandmother   . Heart disease Maternal Grandfather   . Heart disease Maternal Aunt   . Stroke Maternal Aunt   . Heart disease Maternal Uncle   . Heart disease Brother     History  Substance Use Topics  . Smoking status: Current Some Day Smoker -- 0.3 packs/day for 10 years   Types: Cigarettes  . Smokeless tobacco: Never Used   Comment: 1 cig daily, trying to quit   . Alcohol Use: No    OB History    Grav Para Term Preterm Abortions TAB SAB Ect Mult Living                  Review of Systems  All other systems reviewed and are negative.    Allergies  Aspirin and Levofloxacin  Home Medications   Current Outpatient Rx  Name Route Sig Dispense Refill  . ACETAMINOPHEN 500 MG PO TABS Oral Take 500 mg by mouth every 6 (six) hours as needed.      Marland Kitchen BUTALBITAL-APAP-CAFFEINE 50-325-40 MG PO TABS Oral Take 1 tablet by mouth as needed.      . CHLORZOXAZONE 750 MG PO TABS Oral Take 1 tablet by mouth daily as needed.    Marland Kitchen DILTIAZEM HCL ER BEADS 120 MG PO CP24 Oral Take 120 mg by mouth daily.    . DULOXETINE HCL 30 MG PO CPEP Oral Take 30 mg by mouth 2 (two) times daily.      Marland Kitchen FLUTICASONE PROPIONATE 50 MCG/ACT NA SUSP Nasal Place 1 spray into the nose 2 (two) times daily as needed.      Marland Kitchen LACOSAMIDE 50 MG PO TABS Oral Take  50 mg by mouth 2 (two) times daily.    Marland Kitchen LEVONORGESTREL-ETHINYL ESTRAD 0.15-30 MG-MCG PO TABS Oral Take 1 tablet by mouth daily.      . METHYLPHENIDATE HCL 5 MG PO TABS Oral Take 5 mg by mouth 2 (two) times daily.    Marland Kitchen OXCARBAZEPINE 300 MG PO TABS Oral Take 300 mg by mouth 2 (two) times daily.    Marland Kitchen PROMETHAZINE HCL 12.5 MG PO TABS Oral Take 12.5 mg by mouth as needed.      Marland Kitchen CEFUROXIME AXETIL 500 MG PO TABS Oral Take 1 tablet (500 mg total) by mouth 2 (two) times daily. 20 tablet 0    BP 138/84  Pulse 86  Temp(Src) 98 F (36.7 C) (Oral)  Resp 16  SpO2 99%  LMP 04/12/2011  Physical Exam  Nursing note and vitals reviewed. Constitutional: She is oriented to person, place, and time. She appears well-developed and well-nourished.  HENT:  Head: Normocephalic and atraumatic.       Her voice has a nasal tone to it. She has mild percussive tenderness over the left mastoid and left maxilla.  Eyes: Conjunctivae and EOM are normal. Pupils are  equal, round, and reactive to light.  Neck: Normal range of motion and phonation normal. Neck supple.  Cardiovascular: Normal rate.   Pulmonary/Chest: Effort normal. She exhibits no tenderness.  Abdominal: Soft.  Musculoskeletal: Normal range of motion.  Neurological: She is alert and oriented to person, place, and time. She has normal strength. She exhibits normal muscle tone.  Skin: Skin is warm and dry.  Psychiatric: She has a normal mood and affect. Her behavior is normal. Judgment and thought content normal.    ED Course  Procedures (including critical care time)  Labs Reviewed - No data to display No results found.   1. Sinusitis       MDM  Unspecified sinusitis, with a possible mastoid component. Nontoxic. Patient with a history of mastoiditis. Doubt serious bacterial illness, intracranial abscess or metabolic instability at this time   Plan: Home Medications- Ceftin; Home Treatments- Afrin nasal spray; Recommended follow up- PCP prn     Flint Melter, MD 04/26/11 1939

## 2011-04-26 NOTE — ED Notes (Signed)
Patient presents from Urgent Care in [place] for further evaluation of "mastoiditis".  Patient reporting cold symptoms x 1 week with left sided facial pain and swelling x several days.  Face slightly swollen to mastoid process area with mild redness; tender upon palpation.  Patient reports she has a history of mastoiditis in past.

## 2011-04-26 NOTE — Discharge Instructions (Signed)

## 2011-04-29 ENCOUNTER — Ambulatory Visit (INDEPENDENT_AMBULATORY_CARE_PROVIDER_SITE_OTHER): Payer: 59 | Admitting: Internal Medicine

## 2011-04-29 ENCOUNTER — Encounter: Payer: Self-pay | Admitting: Internal Medicine

## 2011-04-29 VITALS — BP 134/90 | HR 86 | Temp 98.5°F | Wt 131.0 lb

## 2011-04-29 DIAGNOSIS — M2669 Other specified disorders of temporomandibular joint: Secondary | ICD-10-CM

## 2011-04-29 DIAGNOSIS — J019 Acute sinusitis, unspecified: Secondary | ICD-10-CM

## 2011-04-29 NOTE — Patient Instructions (Addendum)
Plain Mucinex for thick secretions ;force NON dairy fluids. Use a Neti pot daily as needed for sinus congestion. Nasal cleansing in the shower as discussed. Make sure that all residual soap is removed to prevent irritation. Stop Afrin. Soft or liquid diet if having pain at the temporomandibular joints. Use warm moist compresses to 3 times a day over the painful area

## 2011-04-29 NOTE — Progress Notes (Signed)
  Subjective:    Patient ID: Lorraine Weber, female    DOB: 07/24/1980, 31 y.o.   MRN: [id number]  HPI She was seen in the emergency room with rhinosinusitis and mastoiditis. No labs or imaging were performed. She was placed on cefuroxime 500 mg twice a day for 10 days. This will be completed 05/06/11.She is using Afrin & Fluticasone.    Review of Systems The major and minor symptoms of rhinosinusitis were reviewed. She has nasal congestion w/o obstruction with some nasal purulence & epistaxis.She has L sided facial pain & frontal pressure. She denies anosmia. She has chronic fatigue w/o fever but some night sweats. She has had  halitosis; earache w/o discharge.No dental pain, but L TMJ pain.                                 Objective:   Physical Exam General appearance:thin but in good health &well nourished; no acute distress or increased work of breathing is present.  No  lymphadenopathy about the head, neck, or axilla noted.   Eyes: No conjunctival inflammation or lid edema is present.EOMI ; distant vision decreased (not wearing contact lenses). Ptosis OS  Ears:  External ear exam shows no significant lesions or deformities.  Otoscopic examination reveals clear canals, tympanic membranes are intact bilaterally without bulging, retraction, inflammation or discharge.Bilateral TMJ crepitus. Slightly tender L mastoid w/o skin temp or color changes  Nose:  External nasal examination shows no deformity or inflammation. Nasal mucosa are dry & erythematous without lesions or exudates. No septal dislocation or deviation.No obstruction to airflow.   Oral exam: Dental hygiene is good; lips and gums are healthy appearing.There is no oropharyngeal erythema or exudate noted.     Heart:  Normal rate and regular rhythm. S1 and S2 normal without gallop, murmur, click, rub or other extra sounds.   Lungs:Chest clear to auscultation; no wheezes, rhonchi,rales ,or rubs present.No increased work of breathing.     Extremities:  No cyanosis, edema, or clubbing  noted    Skin: Warm & dry           Assessment & Plan:  #1 rhinosinusitis/mastoiditis  #2 temporomandibular joint dysfunction  Plan: Complete full course of cefuroxime. Neti pot recommended for nasal congestion. Afrin should be discontinued. She should discuss the TMJ issues with her dentist; she may benefit from oral appliance

## 2011-07-09 ENCOUNTER — Encounter: Payer: Self-pay | Admitting: Internal Medicine

## 2011-07-09 ENCOUNTER — Ambulatory Visit (INDEPENDENT_AMBULATORY_CARE_PROVIDER_SITE_OTHER): Payer: 59 | Admitting: Internal Medicine

## 2011-07-09 VITALS — BP 130/84 | HR 95 | Temp 98.0°F | Wt 137.0 lb

## 2011-07-09 DIAGNOSIS — J329 Chronic sinusitis, unspecified: Secondary | ICD-10-CM

## 2011-07-09 MED ORDER — AMOXICILLIN-POT CLAVULANATE ER 1000-62.5 MG PO TB12: 2.0000 | ORAL_TABLET | Freq: Two times a day (BID) | ORAL | Status: AC

## 2011-07-09 NOTE — Patient Instructions (Addendum)
Flonase daily, take antibiotics as prescribed. Please come back in one week to see your primary doctor for a checkup. ER if increased facial swelling, increased fever chills, ear discharge or you are not getting better  Get records from the urgent care    Mastoiditis Mastoiditis is an infection that has spread from the middle ear to a bony area (the mastoid air cells) behind the middle ear. It is an uncommon complication of a middle ear infection. It occurs most often in young children. Treatment with the right antibiotics (medications that are used to treat bacteria germs) is generally effective. With the right treatment, there is a very high chance of full recovery.  SYMPTOMS  Some common symptoms of mastoiditis include:   Pain, swelling, redness, warmth, or a tender mass of the bone behind the ear.   Fever.   Fussiness and irritability.   Redness and swelling of the ear lobe or ear.   Ear drainage.   Headache.  DIAGNOSIS  Your caregiver will make the diagnosis based on an exam and on questions about what you or your child has been feeling. Other tests that may be done include:  Blood work.   Blood cultures or cultures of ear drainage.   X-rays.   If you or your child has symptoms that suggest more serious problems, additional tests and/or specialized x-rays may be done. These could include:   CT (CAT) scans.   Magnetic Resonance Imaging (MRI).  TREATMENT  Treatment will be based on how serious the infection is and what will be expected to give the best outcome. The treatment required may be:  Hospitalization and antibiotics given through a vein.   An operation (myringotomy) is sometimes done to relieve the pressure from the middle ear. This is a surgical procedure where a small hole is cut into the ear drum. A small tube is then placed to keep the hole open and draining. The tubes usually fall out on their own after 6 to 12 months.   In rare cases, if the above  treatments do not work, more surgery may be necessary. This is called a mastoidectomy.  RISKS AND COMPLICATIONS These are rare if proper treatment is started early. These can include:  Facial paralysis   Infection and possible destruction of the mastoid bone.   Spread of infection to the neck.   Hearing loss which can be partial or complete on the side of the infection.   Infection spread to the brain.   Clots or blockage of blood vessels in the neck or brain.  HOME CARE INSTRUCTIONS   Take prescribed antibiotics and other medications as directed by your caregiver.   Follow-up with an exam by an ENT (Ear, Nose, and Throat) specialist if recommended.   A follow-up hearing test (audiogram) may be recommended.  SEEK MEDICAL CARE IF:   You develop recurrent fevers of 100 F (37.8 C) or higher.   You develop new headache, ear, or facial pain that had not happened before.   You feel that there has been loss of hearing.  SEEK IMMEDIATE MEDICAL CARE IF:   You develop fevers of 102 F (38.9 C).   You develop severe headache, ear, or facial pain.   You experience sudden hearing loss.   You develop repeated episodes of vomiting.   You develop weakness or drooping of one side of your face.   You experience weakness of one arm, one leg, or on one side of your body.   You  develop sudden problems with speech and/or vision.  MAKE SURE YOU:   Understand these instructions.   Will watch your condition.   Will get help right away if you are not doing well or get worse.  Document Released: 02/27/2006 Document Revised: 01/17/2011 Document Reviewed: 01/16/2007 [facility] Patient Information 2012 [place], [place].

## 2011-07-09 NOTE — Progress Notes (Signed)
  Subjective:    Patient ID: Lorraine Weber, female    DOB: 1980/12/09, 31 y.o.   MRN: [id number]  HPI Acute visit 10 days ago went to a UC with swelling in the left eye and left face, x-rays were done, she was diagnosed with left sinusitis and treated according to the patient with clindamycin for 7 days and some steroids. She continue with facial pain and swelling and  per her own observation is not  better. She has a history of recurrent mastoiditis "4-5 times a year". Per chart review she was seen in the past  by ID with preseptal cellulitis.  Past Medical History  Diagnosis Date  . Migraines   . Von Willebrand disease   . Anemia   . Nephrolithiasis     X5, Dr Annabell Howells  . Peptic ulcer disease   . Concussion     x8, last 2010, residual homonymous Hemianopsia  . Migraines   . Mastoiditis      Review of Systems Currently she still has fever at night, low-grade and some chills. Mild cough, chest congestion and sore throat. She is status post a traumatic brain injury and her visual disturbances are at baseline.    Objective:   Physical Exam Alert, oriented x3, no apparent distress. Vital signs stable. HEENT: Tympanic membranes normal Very subtle swelling at the left cheek, left upper eyelid slightly drooped, not far from baseline according to the patient. Nose is slightly congested Throat without redness or discharge, uvula midline. Sinuses: Both the frontal and maxillary sinuses are moderately tender to palpation. External ocular movements intact. Pupils equal and reactive Lungs clear to auscultation bilaterally Cardiovascular regular with a rate of murmur     Assessment & Plan:   Sinusitis, Symptoms consistent with sinusitis in a patient with recurring mastoiditis and a history of preseptal cellulitis. I advised the patient that her condition is potentially serious and she definitely needs to call soon if she's not getting better, see instructions. At this time will  prescribe Augmentin. Aware it may make her birth control pills less effective Will try to get the records from the urgent care she was seen at 10 days ago.

## 2011-07-16 ENCOUNTER — Ambulatory Visit (INDEPENDENT_AMBULATORY_CARE_PROVIDER_SITE_OTHER): Payer: 59 | Admitting: Internal Medicine

## 2011-07-16 VITALS — BP 138/82 | HR 88 | Temp 98.3°F | Wt 137.0 lb

## 2011-07-16 DIAGNOSIS — H7092 Unspecified mastoiditis, left ear: Secondary | ICD-10-CM

## 2011-07-16 DIAGNOSIS — J0110 Acute frontal sinusitis, unspecified: Secondary | ICD-10-CM

## 2011-07-16 DIAGNOSIS — H7090 Unspecified mastoiditis, unspecified ear: Secondary | ICD-10-CM

## 2011-07-16 DIAGNOSIS — J320 Chronic maxillary sinusitis: Secondary | ICD-10-CM

## 2011-07-16 NOTE — Progress Notes (Signed)
  Subjective:    Patient ID: Lorraine Weber, female    DOB: April 16, 1980, 31 y.o.   MRN: [id number]  HPI She received 7 days of clindamycin from an urgent care without improvement in her symptoms. She is now on day 8 of 10 of Augmentin, again was suboptimal response.  She continues to have pain in the left frontal sinus, left maxillary sinus, and left mastoid area. She describes treatment secretions from her nose as well as sore throat and pain in the left maxillary teeth. She has had some chilling without fever or sweats  She is not having significant cough or sputum production    Review of Systems She gets some nausea when she takes the Augmentin even though she takes it with food. She's had some loose stool, she is not having frank diarrhea.     Objective:   Physical Exam General appearance:good health ;well nourished; no acute distress or increased work of breathing is present.  No  lymphadenopathy about the head, neck, or axilla noted.   Eyes: No conjunctival inflammation or lid edema is present. Ptosis is present on the left. Extraocular motion is intact. Pupils were equal round reactive to light. Vision is decreased bilaterally; this is a chronic finding  Ears:  External ear exam shows no significant lesions or deformities.  Otoscopic examination reveals clear canals, tympanic membranes are intact bilaterally without bulging, retraction, inflammation or discharge.  Nose:  External nasal examination shows no deformity or inflammation. Nasal mucosa are pink and moist without lesions or exudates. No septal dislocation or deviation.No obstruction to airflow.   Oral exam: Dental hygiene is good; lips and gums are healthy appearing.There is no oropharyngeal erythema or exudate noted.   Neck:  No deformities, thyromegaly, masses, or tenderness noted.  There is slight decreased range of motion of the neck. There is no sign of meningismus with passive neck flexion or knee flexion and  extension  Heart:  Normal rate and regular rhythm. S1 and S2 normal without gallop, murmur, click, rub or other extra sounds.   Lungs:Chest clear to auscultation; no wheezes, rhonchi,rales ,or rubs present.No increased work of breathing.    Extremities:  No cyanosis, edema, or clubbing  noted    Skin: Warm & dry             Assessment & Plan:  #1 historically left frontal, maxillary, and mastoid sinus infections suggested despite over 2 weeks of appropriate antibiotics. At this time there is no evidence of antibiotic induced colitis. Align would be recommended. CT sinus films and CBC and differential will be collected. Consideration be given to changing her antibiotic to metronidazole which will have anaerobic coverage.

## 2011-07-16 NOTE — Patient Instructions (Addendum)
Plain Mucinex for thick secretions ;force NON dairy fluids . Use a Neti pot daily as needed for sinus congestion; going from open side to congested side . Nasal cleansing in the shower as discussed. Make sure that all residual soap is removed to prevent irritation.  

## 2011-07-18 ENCOUNTER — Other Ambulatory Visit: Payer: Self-pay | Admitting: Internal Medicine

## 2011-07-18 ENCOUNTER — Ambulatory Visit (INDEPENDENT_AMBULATORY_CARE_PROVIDER_SITE_OTHER)
Admission: RE | Admit: 2011-07-18 | Discharge: 2011-07-18 | Disposition: A | Discharge status: Home / Self Care | Payer: 59 | Source: Ambulatory Visit | Attending: Internal Medicine | Admitting: Internal Medicine

## 2011-07-18 DIAGNOSIS — H7092 Unspecified mastoiditis, left ear: Secondary | ICD-10-CM

## 2011-07-18 DIAGNOSIS — J0110 Acute frontal sinusitis, unspecified: Secondary | ICD-10-CM

## 2011-07-18 DIAGNOSIS — H7090 Unspecified mastoiditis, unspecified ear: Secondary | ICD-10-CM

## 2011-07-18 DIAGNOSIS — J320 Chronic maxillary sinusitis: Secondary | ICD-10-CM

## 2011-07-19 ENCOUNTER — Other Ambulatory Visit: Payer: Self-pay

## 2011-07-26 ENCOUNTER — Telehealth: Payer: Self-pay | Admitting: *Deleted

## 2011-07-26 NOTE — Telephone Encounter (Signed)
Pt calling about result from CT [facility]OF[facility] WO CM done on 07-18-11. Marland KitchenPlease advise

## 2011-07-27 ENCOUNTER — Other Ambulatory Visit: Payer: Self-pay | Admitting: Internal Medicine

## 2011-07-27 DIAGNOSIS — J329 Chronic sinusitis, unspecified: Secondary | ICD-10-CM

## 2011-07-27 MED ORDER — CEFUROXIME AXETIL 250 MG PO TABS: 250.0000 mg | ORAL_TABLET | Freq: Two times a day (BID) | ORAL | Status: AC

## 2011-07-27 MED ORDER — FLUTICASONE PROPIONATE 50 MCG/ACT NA SUSP: NASAL | Status: DC

## 2011-07-29 ENCOUNTER — Other Ambulatory Visit: Payer: Self-pay | Admitting: Internal Medicine

## 2011-07-29 ENCOUNTER — Telehealth: Payer: Self-pay | Admitting: Internal Medicine

## 2011-07-29 DIAGNOSIS — J329 Chronic sinusitis, unspecified: Secondary | ICD-10-CM

## 2011-07-29 NOTE — Telephone Encounter (Signed)
Patient calling to inform Dr. Alwyn Ren she would like an ENT referral to [facility].  Patient checked with her insurance company, and Lorraine Weber is who they prefer.  Will you please enter referral.

## 2011-07-30 NOTE — Telephone Encounter (Signed)
Referral entered  

## 2011-08-12 ENCOUNTER — Ambulatory Visit (INDEPENDENT_AMBULATORY_CARE_PROVIDER_SITE_OTHER): Payer: 59 | Admitting: Internal Medicine

## 2011-08-12 VITALS — BP 130/82 | HR 86 | Temp 98.2°F | Wt 135.0 lb

## 2011-08-12 DIAGNOSIS — L0390 Cellulitis, unspecified: Secondary | ICD-10-CM

## 2011-08-12 DIAGNOSIS — R05 Cough: Secondary | ICD-10-CM

## 2011-08-12 DIAGNOSIS — R059 Cough, unspecified: Secondary | ICD-10-CM

## 2011-08-12 DIAGNOSIS — L0291 Cutaneous abscess, unspecified: Secondary | ICD-10-CM

## 2011-08-12 DIAGNOSIS — R509 Fever, unspecified: Secondary | ICD-10-CM

## 2011-08-12 MED ORDER — DOXYCYCLINE HYCLATE 100 MG PO TABS: ORAL_TABLET | ORAL | Status: DC

## 2011-08-12 MED ORDER — BENZONATATE 200 MG PO CAPS: 200.0000 mg | ORAL_CAPSULE | Freq: Three times a day (TID) | ORAL | Status: AC | PRN

## 2011-08-12 NOTE — Progress Notes (Signed)
  Subjective:    Patient ID: Lorraine Weber, female    DOB: [birth year], 31 y.o.   MRN: [id number]  HPI She developed a dry cough beginning 08/09/11. This has been associated with intermittent fever with temperatures up to 101.4.  She's continued to have the chronic frontal headache and facial pain. She also has some sore throat.  On 6/27 she was bitten by a spider over the left inferior calf area.    Review of Systems She denies ear pain or discharge; she's had some ringing on the left. She is not having extrinsic symptoms of itchy, watery eyes, post drainage drainage or sneezing     Objective:   Physical Exam General appearance:good health ;well nourished; no acute distress or increased work of breathing is present.  No  lymphadenopathy about the head, neck, or axilla noted.   Eyes: No conjunctival inflammation or lid edema is present. Ptosis OS.  Ears:  External ear exam shows no significant lesions or deformities.  Otoscopic examination reveals clear canals, tympanic membranes are intact bilaterally without bulging, retraction, inflammation or discharge.  Nose:  External nasal examination shows no deformity or inflammation. Nasal mucosa are pink and moist without lesions or exudates. No septal dislocation or deviation.No obstruction to airflow.   Oral exam: Dental hygiene is good; lips and gums are healthy appearing.There is no oropharyngeal erythema or exudate noted.   Neck:  No deformities, thyromegaly, masses, or tenderness noted.   Supple with full range of motion without pain.   Heart:  Normal rate and regular rhythm. S1 and S2 normal without gallop, murmur, click, rub S 4. No  extra sounds.   Lungs:Chest clear to auscultation; no wheezes, rhonchi,rales ,or rubs present.No increased work of breathing.    Extremities:  No cyanosis, edema, or clubbing  noted    Skin: Warm & dry ; there is a 4 x 4 mm eschar circumferential erythema at the left inferior calf            Assessment & Plan:    #1 nonproductive cough associated with intermittent fever  #2 chronic upper respiratory tract symptoms; ENT referral pending  #3 vector bite left lower extremity with mild cellulitis  Plan: See orders and recommendations

## 2011-08-12 NOTE — Patient Instructions (Addendum)
Tylenol every 4 hrs as needed for fever as discussed based on label recommendations. Please try to go on My Chart within the next 24 hours to allow me to release the results directly to you.

## 2011-08-16 ENCOUNTER — Telehealth: Payer: Self-pay | Admitting: *Deleted

## 2011-08-16 MED ORDER — MUPIROCIN 2 % EX OINT: TOPICAL_OINTMENT | Freq: Two times a day (BID) | CUTANEOUS | Status: AC

## 2011-08-16 NOTE — Telephone Encounter (Signed)
Dip gauze in  sterile saline and applied to the wound twice a day. Cover the wound with Telfa , non stick dressing  without any antibiotic ointment. The saline can be purchased at the drugstore or you can make your own .Boil cup of salt in a gallon of water. Store mixture  in a clean container.Report Warning  signs as discussed (red streaks, pus, fever, increasing pain). Generic Bactroban 15 g. Apply the Bactroban 2  times a day after wet to dry dressings.

## 2011-08-16 NOTE — Telephone Encounter (Signed)
Discuss with patient  

## 2011-08-16 NOTE — Telephone Encounter (Signed)
Pt seen on 08-12-11 for Cellulitis. Pt notes that she is half way thru antibiotic and her leg looks no better and she now has yellowish fluid leaking .Pt would like to know if she needs to be on another antibiotic.Please advise

## 2011-08-19 ENCOUNTER — Ambulatory Visit: Payer: 59 | Admitting: Physician Assistant

## 2011-08-19 VITALS — BP 112/72 | HR 87 | Temp 98.7°F | Resp 16 | Ht 65.0 in | Wt 134.0 lb

## 2011-08-19 DIAGNOSIS — L03119 Cellulitis of unspecified part of limb: Secondary | ICD-10-CM

## 2011-08-19 DIAGNOSIS — L02419 Cutaneous abscess of limb, unspecified: Secondary | ICD-10-CM

## 2011-08-19 MED ORDER — SULFAMETHOXAZOLE-TRIMETHOPRIM 800-160 MG PO TABS: 1.0000 | ORAL_TABLET | Freq: Two times a day (BID) | ORAL | Status: AC

## 2011-08-19 NOTE — Patient Instructions (Signed)
Continue to wash the wound daily with saline and soap and water in the shower.  Apply the antibiotic ointment with each dressing change.  Allow the wound to remain uncovered, with the antitbiotic ointment for a period of time every day when it can also stay clean.  Keep the leg elevated as much as you can. If you are not  noticing significant improvement in 48-72 hours, please return here or to your primary care provider for re-evaluation.

## 2011-08-19 NOTE — Progress Notes (Signed)
Subjective:    Patient ID: Lorraine Weber, female    DOB: 1980/07/10, 31 y.o.   MRN: [id number]  HPI  31 y.o. year-old woman presents with cellulitis on the left lower leg.  She was seen by Dr. Nelva Nay. Alwyn Ren, her PCP, on 08/12/2011 with respiratory symptoms and a one day old spider bite.  She was started on Doxycyline and was advised to use mupirocin ointment topically.  Today she notes that she hasn't had improvement with the treatment and has started having increased pain at the site over the last 2 days.  She has continued to have purulent drainage "like a volcano" from the site which looks like "a hole."  No fever, chills, nausea, vomiting.  She is unemployed, on disability from a TBI in 2010, and is housebreaking puppies.  Her husband is present with her today.  Past Medical History  Diagnosis Date  . Migraines   . Von Willebrand disease   . Anemia   . Nephrolithiasis     X5, Dr Annabell Howells  . Peptic ulcer disease   . Concussion     x8, last 2010, residual homonymous Hemianopsia  . Migraines   . Mastoiditis     Prior to Admission medications   Medication Sig Start Date End Date Taking? Authorizing Provider  acetaminophen (TYLENOL) 500 MG tablet Take 500 mg by mouth every 6 (six) hours as needed.     Yes Historical Provider, MD  benzonatate (TESSALON) 200 MG capsule Take 1 capsule (200 mg total) by mouth 3 (three) times daily as needed for cough. 08/12/11 08/19/11 Yes Pecola Lawless, MD  butalbital-acetaminophen-caffeine [facility]) [phone] MG per tablet Take 1 tablet by mouth as needed.     Yes Historical Provider, MD  Chlorzoxazone (LORZONE) 750 MG TABS Take 1 tablet by mouth daily as needed.   Yes Historical Provider, MD  diltiazem (CARDIZEM CD) 120 MG 24 hr capsule Take 120 mg by mouth daily.   Yes Historical Provider, MD  doxycycline (VIBRA-TABS) 100 MG tablet Avoid sun 08/12/11  Yes Pecola Lawless, MD  DULoxetine (CYMBALTA) 30 MG capsule Take 30 mg by mouth 2 (two) times daily.     Yes  Historical Provider, MD  fluticasone (FLONASE) 50 MCG/ACT nasal spray 1 spray in each nostril twice a day as needed. Use the "crossover" technique as discussed 07/27/11  Yes Pecola Lawless, MD  mupirocin ointment (BACTROBAN) 2 % Apply topically 2 (two) times daily. After wet to dry dressing 08/16/11 08/23/11 Yes Pecola Lawless, MD  norgestrel-ethinyl estradiol (CRYSELLE-28) 0.3-30 MG-MCG tablet Take 1 tablet by mouth daily.   Yes Historical Provider, MD  pregabalin (LYRICA) 75 MG capsule Take 75 mg by mouth 3 (three) times daily. Take 1 tab in am. Take 2 tab in pm   Yes Historical Provider, MD  promethazine (PHENERGAN) 12.5 MG tablet Take 12.5 mg by mouth as needed.     Yes Historical Provider, MD  sulfamethoxazole-trimethoprim (BACTRIM DS,SEPTRA DS) 800-160 MG per tablet Take 1 tablet by mouth 2 (two) times daily. 08/19/11 08/29/11  Shadai Mcclane Tessa Lerner, PA-C    Allergies  Allergen Reactions  . Aspirin     REACTION: ANY BLOOD THINNERS/ DUE TO CURRENT BLOOD DISORDER  . Levofloxacin     REACTION: RASH    History   Social History  . Marital Status: Married    Spouse Name: N/A    Number of Children: N/A  . Years of Education: N/A   Social History Main Topics  .  Smoking status: Current Some Day Smoker -- 0.3 packs/day for 10 years    Types: Cigarettes  . Smokeless tobacco: Never Used   Comment: 1 cig daily, trying to quit   . Alcohol Use: No  . Drug Use: No  . Sexually Active: Yes -- Female partner(s)    Family History  Problem Relation Age of Onset  . Arthritis Father   . Hyperlipidemia Father   . Diabetes Father   . Stroke Father     PTE post CVA  . Prostate cancer Paternal Grandfather   . Hyperlipidemia Paternal Grandfather   . Diabetes Paternal Grandfather   . Heart disease Mother   . Heart disease Maternal Grandmother   . Diabetes Maternal Grandmother   . Heart disease Maternal Grandfather   . Heart disease Maternal Aunt   . Stroke Maternal Aunt   . Heart disease Maternal  Uncle   . Heart disease Brother      Review of Systems As above.    Objective:   Physical Exam Vital signs noted. Well-developed, well nourished WF who is awake, alert and oriented, in NAD. Extremities: no cyanosis, clubbing or edema. Skin: warm and dry.  There is a lesion on the outer aspect of the left lower leg with a shallow opening of 4 mm surrounded by a 3 mm erythematous border.  No active drainage, no induration, no fluctuence.  There is yellow eschar noted covering the wound bed.  The area is tender on palpation.     Assessment & Plan:   1. Cellulitis, leg  sulfamethoxazole-trimethoprim (BACTRIM DS,SEPTRA DS) 800-160 MG per tablet, Wound culture   Wound care instructions given.

## 2011-08-19 NOTE — Telephone Encounter (Signed)
FYI: Patient called reporting leg is worse: increasing pain, intermittent fever w/high spikes & pus drainage; pt advised to seek medical attention now at UC/[facility] for A&E of leg presenting signs of Infection, pt understood & agreed/SLS

## 2011-08-22 LAB — WOUND CULTURE
Gram Stain: NONE SEEN
Gram Stain: NONE SEEN
Gram Stain: NONE SEEN
Organism ID, Bacteria: NO GROWTH

## 2011-10-01 ENCOUNTER — Encounter: Payer: Self-pay | Admitting: Internal Medicine

## 2011-10-01 ENCOUNTER — Ambulatory Visit (INDEPENDENT_AMBULATORY_CARE_PROVIDER_SITE_OTHER): Payer: 59 | Admitting: Internal Medicine

## 2011-10-01 VITALS — BP 132/90 | HR 88 | Temp 98.2°F | Wt 136.6 lb

## 2011-10-01 DIAGNOSIS — N200 Calculus of kidney: Secondary | ICD-10-CM

## 2011-10-01 DIAGNOSIS — R319 Hematuria, unspecified: Secondary | ICD-10-CM

## 2011-10-01 DIAGNOSIS — J329 Chronic sinusitis, unspecified: Secondary | ICD-10-CM | POA: Insufficient documentation

## 2011-10-01 LAB — POCT URINALYSIS DIPSTICK
Bilirubin, UA: NEGATIVE
Glucose, UA: NEGATIVE
Ketones, UA: NEGATIVE
Nitrite, UA: NEGATIVE
Protein, UA: NEGATIVE
Spec Grav, UA: 1.015
Urobilinogen, UA: 0.2
pH, UA: 6

## 2011-10-01 MED ORDER — HYDROCODONE-ACETAMINOPHEN 7.5-500 MG PO TABS: 1.0000 | ORAL_TABLET | ORAL | Status: AC | PRN

## 2011-10-01 NOTE — Progress Notes (Signed)
  Subjective:    Patient ID: Lorraine Weber, female    DOB: 12-04-1980, 31 y.o.   MRN: [id number]  HPI She began to experience dysuria and hematuria in the context of right lumbosacral/flank pain up to a level of 10 on 09/26/11. The pain radiated to the right lower quadrant. She's also had some discomfort in the right anterior thigh. Last night she passed a somewhat spiculated renal calculus measuring 4.5 x 6 mm.  She's continued to have a dysuria and hematuria; but this has improved. She also continues to have the right lower flank pain up to 5.  She's had at least 8 renal calculi. She is unsure of the composition of the stones. She previously saw Dr.Wrenn of [facility]. The only restriction he recommended was avoiding sodas  Review of Systems She has no fever, chills, or sweats. She's been followed at [facility] for chronic sinusitis by Dr. Betsey Amen. He has performed fiberoptic nasal endoscopy.     Objective:   Physical Exam She appears healthy and well-nourished; she appears mildly uncomfortable.  She has some minor, shoddy lymphadenopathy in the left neck.  Chest is clear to auscultation without increased work of breathing  She has an S4 with a regular rhythm.  There is tenderness to palpation in the right lower quadrant. There is discomfort with percussion over the right lower flank.  Deep tendon reflexes are equal and normal at the knees.  Straight leg raising is negative bilaterally          Assessment & Plan:  #1 renal calculus, significantly large stone present in.  #2 dysuria and hematuria; culture and sensitivity will be collected  Plan: See orders and recommendations

## 2011-10-01 NOTE — Patient Instructions (Addendum)
Drink as much nondairy fluids as possible. Avoid spicy foods or alcohol as  these may aggravate the symptoms. Do not take decongestants.

## 2011-10-02 LAB — POTASSIUM: Potassium: 4.5 mEq/L (ref 3.5–5.1)

## 2011-10-02 LAB — CREATININE, SERUM: Creatinine, Ser: 0.6 mg/dL (ref 0.4–1.2)

## 2011-10-02 LAB — BUN: BUN: 9 mg/dL (ref 6–23)

## 2011-10-03 LAB — URINE CULTURE
Colony Count: NO GROWTH
Organism ID, Bacteria: NO GROWTH

## 2011-12-30 DIAGNOSIS — IMO0001 Reserved for inherently not codable concepts without codable children: Secondary | ICD-10-CM | POA: Diagnosis not present

## 2011-12-30 DIAGNOSIS — G44329 Chronic post-traumatic headache, not intractable: Secondary | ICD-10-CM | POA: Diagnosis not present

## 2011-12-30 DIAGNOSIS — Z049 Encounter for examination and observation for unspecified reason: Secondary | ICD-10-CM | POA: Diagnosis not present

## 2011-12-30 DIAGNOSIS — G43719 Chronic migraine without aura, intractable, without status migrainosus: Secondary | ICD-10-CM | POA: Diagnosis not present

## 2011-12-30 DIAGNOSIS — M62838 Other muscle spasm: Secondary | ICD-10-CM | POA: Diagnosis not present

## 2011-12-30 DIAGNOSIS — Z79899 Other long term (current) drug therapy: Secondary | ICD-10-CM | POA: Diagnosis not present

## 2012-01-06 DIAGNOSIS — G518 Other disorders of facial nerve: Secondary | ICD-10-CM | POA: Diagnosis not present

## 2012-01-06 DIAGNOSIS — M542 Cervicalgia: Secondary | ICD-10-CM | POA: Diagnosis not present

## 2012-01-06 DIAGNOSIS — IMO0001 Reserved for inherently not codable concepts without codable children: Secondary | ICD-10-CM | POA: Diagnosis not present

## 2012-01-06 DIAGNOSIS — R51 Headache: Secondary | ICD-10-CM | POA: Diagnosis not present

## 2012-01-20 DIAGNOSIS — G518 Other disorders of facial nerve: Secondary | ICD-10-CM | POA: Diagnosis not present

## 2012-01-20 DIAGNOSIS — IMO0001 Reserved for inherently not codable concepts without codable children: Secondary | ICD-10-CM | POA: Diagnosis not present

## 2012-01-20 DIAGNOSIS — M542 Cervicalgia: Secondary | ICD-10-CM | POA: Diagnosis not present

## 2012-01-20 DIAGNOSIS — R51 Headache: Secondary | ICD-10-CM | POA: Diagnosis not present

## 2012-01-23 ENCOUNTER — Encounter: Payer: Self-pay | Admitting: Internal Medicine

## 2012-01-23 ENCOUNTER — Ambulatory Visit (INDEPENDENT_AMBULATORY_CARE_PROVIDER_SITE_OTHER): Payer: 59 | Admitting: Internal Medicine

## 2012-01-23 VITALS — BP 122/88 | HR 94 | Temp 98.1°F | Wt 141.0 lb

## 2012-01-23 DIAGNOSIS — J019 Acute sinusitis, unspecified: Secondary | ICD-10-CM | POA: Diagnosis not present

## 2012-01-23 MED ORDER — AMOXICILLIN-POT CLAVULANATE 875-125 MG PO TABS: 1.0000 | ORAL_TABLET | Freq: Two times a day (BID) | ORAL | Status: DC

## 2012-01-23 MED ORDER — FLUTICASONE PROPIONATE 50 MCG/ACT NA SUSP: 1.0000 | Freq: Two times a day (BID) | NASAL | Status: DC

## 2012-01-23 NOTE — Patient Instructions (Signed)
Review and correct the record as indicated. Please share record with all medical staff seen.  

## 2012-01-23 NOTE — Progress Notes (Signed)
  Subjective:    Patient ID: Lorraine Weber, female    DOB: 1980-11-08, 31 y.o.   MRN: [id number]  HPI  Symptoms began more than 7 days ago as a severe sore throat which resolved. Unfortunately she then developed head congestion with associated frontal headache, facial pain, nasal purulence and some bloody discharge in the nose. Cough has not been significant. She has had a low-grade fever to 100 without associated chills and sweats.  She had some ear discomfort which has resolved  She has been continuing on aggressive nasal hygiene program.    Review of Systems She has had no extrinsic symptoms of itchy, watery eyes or sneezing. As stated the cough is not significant and not associated with shortness of breath or wheezing     Objective:   Physical Exam General appearance:good health ;well nourished; no acute distress or increased work of breathing is present.  Shotty cervial  lymphadenopathy ; none in  axilla    Eyes: No conjunctival inflammation or lid edema is present. Decreased OS lateral rotation.Slight ptosis OS. Decreased acuity(chronic)  Ears:  External ear exam shows mild scar tissue.  Otoscopic examination reveals clear canals, tympanic membranes are intact bilaterally without bulging, retraction, inflammation or discharge.  Nose:  External nasal examination shows no deformity or inflammation. Nasal mucosa are pink and moist without lesions or exudates. No septal dislocation or deviation.No obstruction to airflow. Hyponasal speech   Oral exam: Dental hygiene is good; lips and gums are healthy appearing.There is no oropharyngeal erythema or exudate noted.   Neck:  No deformities,  masses, or tenderness noted.   Supple with full range of motion without pain.   Heart:  Normal rate and regular rhythm. S1 and S2 normal without gallop, murmur, click, rub or other extra sounds.   Lungs:Chest clear to auscultation; no wheezes, rhonchi,rales ,or rubs present.No increased work of  breathing.    Extremities:  No cyanosis, edema, or clubbing  noted    Skin: Warm & dry w/o jaundice or tenting.          Assessment & Plan:  #1 rhinosinusitis without significant bronchitis  Plan: Nasal hygiene interventions discussed. See prescription medications

## 2012-02-07 DIAGNOSIS — IMO0001 Reserved for inherently not codable concepts without codable children: Secondary | ICD-10-CM | POA: Diagnosis not present

## 2012-02-07 DIAGNOSIS — R51 Headache: Secondary | ICD-10-CM | POA: Diagnosis not present

## 2012-02-07 DIAGNOSIS — M542 Cervicalgia: Secondary | ICD-10-CM | POA: Diagnosis not present

## 2012-02-07 DIAGNOSIS — G518 Other disorders of facial nerve: Secondary | ICD-10-CM | POA: Diagnosis not present

## 2012-03-09 ENCOUNTER — Ambulatory Visit (INDEPENDENT_AMBULATORY_CARE_PROVIDER_SITE_OTHER): Payer: 59 | Admitting: Internal Medicine

## 2012-03-09 ENCOUNTER — Encounter: Payer: Self-pay | Admitting: Internal Medicine

## 2012-03-09 VITALS — BP 122/84 | HR 99 | Temp 98.1°F | Wt 138.0 lb

## 2012-03-09 DIAGNOSIS — J019 Acute sinusitis, unspecified: Secondary | ICD-10-CM | POA: Diagnosis not present

## 2012-03-09 MED ORDER — CEFUROXIME AXETIL 500 MG PO TABS: 500.0000 mg | ORAL_TABLET | Freq: Two times a day (BID) | ORAL | Status: DC

## 2012-03-09 NOTE — Progress Notes (Signed)
  Subjective:    Patient ID: Lorraine Weber, female    DOB: 06-06-1980, 32 y.o.   MRN: [id number]  HPI The respiratory tract symptoms began 03/02/12 as sore throat , rhinitis, head congestion , & chest congestion    Significant active  associated symptoms include frontal headache, facial pain, dental pain, persistent sore throat on R,  & nasal purulence Cough is  associated with  wheezing in cold air but not  shortness of breath  Sputum is not coughed up.  Fever  and sweats  were present @ night      Flu shot  NOT current        Treatment with  Neti pot , nasal cleansing, Tylenol,Mucinex was partially effective   There is no history of asthma ; no seasonal or  perennial allergies. The patient  is smoking 1 cig/day                  Review of Systems Symptoms not present include  earache and otic discharge Itchy , watery eyes & sneezing were not noted.  Myalgias and arthralgias were not present       Objective:   Physical Exam  General appearance:thin but in good health ;well nourished; no acute distress or increased work of breathing is present.  No  lymphadenopathy about the head, neck, or axilla noted.  Eyes: No conjunctival inflammation or lid edema is present. OS ptosis. Ears:  External ear exam shows no significant lesions or deformities.  Otoscopic examination reveals clear canals, tympanic membranes are intact bilaterally without bulging, retraction, inflammation or discharge. Nose:  External nasal examination shows no deformity or inflammation. Nasal mucosa are dry without lesions or exudates. No septal dislocation or deviation.No obstruction to airflow.  Oral exam: Dental hygiene is good; lips and gums are healthy appearing.There is no oropharyngeal erythema or exudate noted.  Neck:  No deformities,  masses, or tenderness noted.     Heart:  Normal rate and regular rhythm. S1 and S2 normal without gallop, murmur, click, rub or other extra sounds.    Lungs:Chest clear to auscultation; no wheezes, rhonchi,rales ,or rubs present.No increased work of breathing.   Extremities:  No cyanosis or clubbing  noted  Skin: Warm & dry .        Assessment & Plan:  #1 rhinosinusitis with possible significant bronchitis  Plan: Nasal hygiene interventions discussed. See prescription medications

## 2012-03-09 NOTE — Patient Instructions (Addendum)
Plain Mucinex for thick secretions ;force NON dairy fluids.  

## 2012-03-23 DIAGNOSIS — G43719 Chronic migraine without aura, intractable, without status migrainosus: Secondary | ICD-10-CM | POA: Diagnosis not present

## 2012-03-23 DIAGNOSIS — G44329 Chronic post-traumatic headache, not intractable: Secondary | ICD-10-CM | POA: Diagnosis not present

## 2012-03-24 ENCOUNTER — Encounter: Payer: Self-pay | Admitting: Internal Medicine

## 2012-03-24 ENCOUNTER — Ambulatory Visit (INDEPENDENT_AMBULATORY_CARE_PROVIDER_SITE_OTHER): Payer: 59 | Admitting: Internal Medicine

## 2012-03-24 VITALS — BP 110/60 | HR 110 | Temp 98.0°F | Wt 138.0 lb

## 2012-03-24 DIAGNOSIS — N390 Urinary tract infection, site not specified: Secondary | ICD-10-CM | POA: Diagnosis not present

## 2012-03-24 DIAGNOSIS — R350 Frequency of micturition: Secondary | ICD-10-CM

## 2012-03-24 DIAGNOSIS — J019 Acute sinusitis, unspecified: Secondary | ICD-10-CM

## 2012-03-24 DIAGNOSIS — R300 Dysuria: Secondary | ICD-10-CM

## 2012-03-24 DIAGNOSIS — M26609 Unspecified temporomandibular joint disorder, unspecified side: Secondary | ICD-10-CM

## 2012-03-24 LAB — POCT URINALYSIS DIPSTICK
Bilirubin, UA: NEGATIVE
Glucose, UA: NEGATIVE
Ketones, UA: NEGATIVE
Nitrite, UA: NEGATIVE
Protein, UA: NEGATIVE
Spec Grav, UA: 1.01
Urobilinogen, UA: 0.2
pH, UA: 7

## 2012-03-24 MED ORDER — PHENAZOPYRIDINE HCL 200 MG PO TABS: ORAL_TABLET | ORAL | Status: DC

## 2012-03-24 MED ORDER — SULFAMETHOXAZOLE-TRIMETHOPRIM 800-160 MG PO TABS: 1.0000 | ORAL_TABLET | Freq: Two times a day (BID) | ORAL | Status: DC

## 2012-03-24 NOTE — Progress Notes (Signed)
  Subjective:    Patient ID: Lorraine Weber, female    DOB: 1980/08/05, 32 y.o.   MRN: [id number]  HPI  She began to have urinary frequency 03/22/12 without associated dysuria or hematuria.  Following a course of cefuroxime she had some improvement in her upper respiratory tract infection symptoms but these have exacerbated as of one week ago manifested initially as pain at the left TMJ joint w/o associated dental pain.  She describes fever, chills and sweats; temperature has been as high as 100-101.  She describes frontal headache, facial pain, nasal purulence, and the pain into the ear without associated discharge.    Review of Systems She's had scant green sputum; cough and sputum production have not been significant with the present illness.     Objective:   Physical Exam  Gen.: Thin but adequately nourished in appearance. Alert, appropriate and cooperative throughout exam. Appears younger than stated age  Eyes: No corneal or conjunctival inflammation noted. Slight ptosis OS. Vision grossly decreased bilaterally even with lenses. Ears: External  ear exam reveals no significant lesions or deformities. Canals reveal mild chronic skin changes. .TMs normal. Pain left TM to palpation and with mastication maneuvers. Nose: External nasal exam reveals no deformity or inflammation. Nasal mucosa are dry & erythematous. No lesions or exudates noted. Septum  Not deviated Mouth: Oral mucosa and oropharynx reveal no lesions or exudates. Teeth in good repair. Neck: Slight lymphadenopathy left posterior cervical chain Lungs: Normal respiratory effort; chest expands symmetrically. Lungs are clear to auscultation without rales, wheezes, or increased work of breathing. Heart: Normal rate and rhythm. Normal S1 and S2. No gallop, click, or rub. No murmur. Abdomen: Bowel sounds normal; abdomen soft but slightly tender in the right lower quadrant. No guarding or rebound present. No masses, organomegaly or hernias  noted. Slight tenderness to percussion over posterior flank bilaterally Musculoskeletal/extremities: No deformity or scoliosis noted of  the thoracic or lumbar spine. No clubbing, cyanosis, edema, or significant extremity  deformity noted.  Negative SLR bilaterally Neurologic: Alert and oriented x3. Deep tendon reflexes symmetrical and normal.  Skin: Intact without suspicious lesions or rashes. Perioral drying present Lymph: No  axillary lymphadenopathy present. Psych: Mood and affect are normal. Normally interactive                                                                                       Assessment & Plan:  #1 frequency with abnormal urine; culture and sensitivity pending  #2 persistent sinusitis symptoms despite cefuroxime  #3 TMJ on the left  Plan: See orders and recommendations

## 2012-03-24 NOTE — Patient Instructions (Addendum)
Using the A&D  ointment twice a day for the rash periorally. Soft or liquid diet if having pain at the temporomandibular joints. Use warm moist compresses to 3 times a day over the painful area.

## 2012-03-26 LAB — URINE CULTURE: Colony Count: 3000

## 2012-03-28 ENCOUNTER — Other Ambulatory Visit: Payer: Self-pay

## 2012-04-27 ENCOUNTER — Ambulatory Visit (INDEPENDENT_AMBULATORY_CARE_PROVIDER_SITE_OTHER): Payer: 59 | Admitting: Family Medicine

## 2012-04-27 ENCOUNTER — Encounter: Payer: Self-pay | Admitting: Family Medicine

## 2012-04-27 VITALS — BP 126/70 | HR 100 | Temp 98.7°F | Ht 64.75 in | Wt 135.4 lb

## 2012-04-27 DIAGNOSIS — R059 Cough, unspecified: Secondary | ICD-10-CM

## 2012-04-27 DIAGNOSIS — L0390 Cellulitis, unspecified: Secondary | ICD-10-CM

## 2012-04-27 DIAGNOSIS — R509 Fever, unspecified: Secondary | ICD-10-CM

## 2012-04-27 DIAGNOSIS — R05 Cough: Secondary | ICD-10-CM

## 2012-04-27 DIAGNOSIS — L0291 Cutaneous abscess, unspecified: Secondary | ICD-10-CM

## 2012-04-27 DIAGNOSIS — H7090 Unspecified mastoiditis, unspecified ear: Secondary | ICD-10-CM | POA: Diagnosis not present

## 2012-04-27 DIAGNOSIS — J329 Chronic sinusitis, unspecified: Secondary | ICD-10-CM

## 2012-04-27 MED ORDER — PROMETHAZINE HCL 12.5 MG PO TABS: 12.5000 mg | ORAL_TABLET | ORAL | Status: DC | PRN

## 2012-04-27 MED ORDER — DOXYCYCLINE HYCLATE 100 MG PO TABS: 100.0000 mg | ORAL_TABLET | Freq: Two times a day (BID) | ORAL | Status: DC

## 2012-04-27 NOTE — Assessment & Plan Note (Signed)
New to provider, ongoing for pt.  Pt reports she has not been hospitalized for this previously or received IV abx- typically takes oral meds.  Has ENT appt later this week.  Start Doxy.  Reviewed supportive care and red flags that should prompt return.  Pt expressed understanding and is in agreement w/ plan.

## 2012-04-27 NOTE — Patient Instructions (Addendum)
Follow up as scheduled w/ Dr Cira Servant Start the Doxy twice daily- take w/ food Drink plenty of fluids Phenergan as needed for nausea If your symptoms change or worsen- please call or go to the ER REST! Hang in there!!!

## 2012-04-27 NOTE — Assessment & Plan Note (Signed)
New to provider, ongoing for pt.  Pt TTP over both L frontal and maxillary sinus.  Has ENT appt later this week.  Start Doxy.  Phenergan for nausea.  Reviewed supportive care and red flags that should prompt return.  Pt expressed understanding and is in agreement w/ plan.

## 2012-04-27 NOTE — Progress Notes (Signed)
  Subjective:    Patient ID: Lorraine Weber, female    DOB: 11-May-1980, 32 y.o.   MRN: [id number]  HPI Face/jaw pain- L sided, started yesterday.  Has hx of frequent sinus infxns, initially thought this was going to be similar but pain is much worse and different than usual.  + nausea.  + HA- L frontal 'throb'.  Mild nasal congestion.  No coughing.  No fevers.  Does not grind teeth.  Teeth are hurting both upper and lower.  Husband reports slight facial swelling.  Sees ENT at [facility].  Hx of mastoiditis, having pain on L.   Review of Systems For ROS see HPI     Objective:   Physical Exam  Vitals reviewed. Constitutional: She appears well-developed and well-nourished. No distress.  HENT:  Head: Normocephalic and atraumatic.  Right Ear: Tympanic membrane normal.  Left Ear: Tympanic membrane normal.  Nose: Mucosal edema and rhinorrhea present. Right sinus exhibits no maxillary sinus tenderness and no frontal sinus tenderness. Left sinus exhibits maxillary sinus tenderness and frontal sinus tenderness.  Mouth/Throat: Uvula is midline and mucous membranes are normal. Posterior oropharyngeal erythema present. No oropharyngeal exudate.  + TTP over L TMJ/masseter + TTP over L mastoid w/ overlying LAD  Eyes: Conjunctivae and EOM are normal. Pupils are equal, round, and reactive to light.  Neck: Normal range of motion. Neck supple.  Cardiovascular: Normal rate, regular rhythm and normal heart sounds.   Pulmonary/Chest: Effort normal and breath sounds normal. No respiratory distress. She has no wheezes.  Lymphadenopathy:    She has no cervical adenopathy.          Assessment & Plan:

## 2012-04-30 DIAGNOSIS — H6120 Impacted cerumen, unspecified ear: Secondary | ICD-10-CM | POA: Diagnosis not present

## 2012-04-30 DIAGNOSIS — M26629 Arthralgia of temporomandibular joint, unspecified side: Secondary | ICD-10-CM | POA: Diagnosis not present

## 2012-04-30 DIAGNOSIS — H6980 Other specified disorders of Eustachian tube, unspecified ear: Secondary | ICD-10-CM | POA: Diagnosis not present

## 2012-04-30 DIAGNOSIS — G501 Atypical facial pain: Secondary | ICD-10-CM | POA: Diagnosis not present

## 2012-04-30 DIAGNOSIS — J329 Chronic sinusitis, unspecified: Secondary | ICD-10-CM | POA: Diagnosis not present

## 2012-05-18 ENCOUNTER — Other Ambulatory Visit: Payer: Self-pay | Admitting: Obstetrics and Gynecology

## 2012-05-18 DIAGNOSIS — Z01419 Encounter for gynecological examination (general) (routine) without abnormal findings: Secondary | ICD-10-CM | POA: Diagnosis not present

## 2012-05-18 DIAGNOSIS — Z124 Encounter for screening for malignant neoplasm of cervix: Secondary | ICD-10-CM | POA: Diagnosis not present

## 2012-07-02 ENCOUNTER — Ambulatory Visit (INDEPENDENT_AMBULATORY_CARE_PROVIDER_SITE_OTHER): Payer: 59 | Admitting: Family Medicine

## 2012-07-02 ENCOUNTER — Encounter: Payer: Self-pay | Admitting: Family Medicine

## 2012-07-02 VITALS — BP 150/70 | HR 112 | Temp 98.2°F | Ht 64.75 in | Wt 137.6 lb

## 2012-07-02 DIAGNOSIS — L723 Sebaceous cyst: Secondary | ICD-10-CM | POA: Insufficient documentation

## 2012-07-02 DIAGNOSIS — L0291 Cutaneous abscess, unspecified: Secondary | ICD-10-CM | POA: Diagnosis not present

## 2012-07-02 DIAGNOSIS — R05 Cough: Secondary | ICD-10-CM

## 2012-07-02 DIAGNOSIS — R059 Cough, unspecified: Secondary | ICD-10-CM

## 2012-07-02 DIAGNOSIS — L0390 Cellulitis, unspecified: Secondary | ICD-10-CM

## 2012-07-02 DIAGNOSIS — R509 Fever, unspecified: Secondary | ICD-10-CM | POA: Diagnosis not present

## 2012-07-02 MED ORDER — DOXYCYCLINE HYCLATE 100 MG PO TABS: 100.0000 mg | ORAL_TABLET | Freq: Two times a day (BID) | ORAL | Status: DC

## 2012-07-02 NOTE — Assessment & Plan Note (Signed)
New.  Start abx.  Hot compresses.  Reviewed supportive care and red flags that should prompt return.  Pt expressed understanding and is in agreement w/ plan.

## 2012-07-02 NOTE — Progress Notes (Signed)
  Subjective:    Patient ID: Lorraine Weber, female    DOB: 12-05-1980, 32 y.o.   MRN: [id number]  HPI Lump under R arm.  Noticed yesterday.  Armpit was sore for a few days prior but lump appeared yesterday.  + TTP or pressure.  Pain described as an ache.  No redness of skin, no drainage.  No recent rashes, illness, or skin lesions.  Has hx of swollen axillary LNs.  Did get tattoo on R wrist 1 month ago.  Hx of sebaceous cysts.   Review of Systems For ROS see HPI     Objective:   Physical Exam  Vitals reviewed. Constitutional: She appears well-developed and well-nourished. No distress.  Lymphadenopathy:    She has no cervical adenopathy.       Right axillary: No pectoral and no lateral adenopathy present.       Left axillary: No pectoral and no lateral adenopathy present.      Right: No supraclavicular and no epitrochlear adenopathy present.       Left: No supraclavicular adenopathy present.  Skin: Skin is warm and dry. There is erythema (mild overlying R axillary sebaceous cyst).  Multiple scabs and areas on arms and shoulder- looks to be consistent w/ skin picking  2 cm firm, mobile soft tissue mass under skin in R axilla consistent w/ sebaceous cyst          Assessment & Plan:

## 2012-07-02 NOTE — Patient Instructions (Addendum)
Start the Doxy twice daily- take w/ food Hot compresses under the arm Neosporin to the wounds Try and stop smoking If the cyst comes to a head an drains, this is good news If it becomes much larger, more painful or other concerns- please call Hang in there!

## 2012-07-08 DIAGNOSIS — Z30431 Encounter for routine checking of intrauterine contraceptive device: Secondary | ICD-10-CM | POA: Diagnosis not present

## 2012-07-13 DIAGNOSIS — G43909 Migraine, unspecified, not intractable, without status migrainosus: Secondary | ICD-10-CM | POA: Diagnosis not present

## 2012-07-13 DIAGNOSIS — S060X9A Concussion with loss of consciousness of unspecified duration, initial encounter: Secondary | ICD-10-CM | POA: Diagnosis not present

## 2012-07-14 ENCOUNTER — Ambulatory Visit (INDEPENDENT_AMBULATORY_CARE_PROVIDER_SITE_OTHER): Payer: 59 | Admitting: Internal Medicine

## 2012-07-14 ENCOUNTER — Encounter: Payer: Self-pay | Admitting: Internal Medicine

## 2012-07-14 VITALS — BP 130/82 | HR 122 | Temp 98.2°F | Wt 138.6 lb

## 2012-07-14 DIAGNOSIS — D235 Other benign neoplasm of skin of trunk: Secondary | ICD-10-CM | POA: Diagnosis not present

## 2012-07-14 NOTE — Patient Instructions (Addendum)
Dip gauze in  sterile saline and applied to the wound 2-3 a day.  The saline can be purchased at the drugstore or you can make your own .Boil cup of salt in a gallon of water. Store mixture  in a clean container.Report Warning  signs as discussed (red streaks, pus, fever, increasing pain).  Aveeno Daily  Moisturizing Lotion  twice a day  for the dry skin. Bathe with moisturizing liquid soap , not bar soap.

## 2012-07-14 NOTE — Progress Notes (Signed)
  Subjective:    Patient ID: Lorraine Weber, female    DOB: 1980-05-03, 32 y.o.   MRN: [id number]  HPI  Symptoms began 2-3 weeks ago as lump noted in the right axilla which was noted incidentally while shaving. It was slightly tender with shaving but not associated with significant change in color or temperature. There have been no injury or trigger for this mass.  She had low-grade fever but this resolved after doxycycline was prescribed for the lump.  The lesion may have decreased in size slightly with the doxycycline.  She does have a past history of MRSA. They do have 5 dogs; there's been no definite tick exposure.    Review of Systems  At this time she has no fever, chills, sweats, or unexplained weight loss.     Objective:   Physical Exam  She appears healthy and well-nourished  She has no scleral icterus or conjunctivitis.  Chest is clear to auscultation with no abnormal breath sounds   She exhibits a resting tachycardia  No organomegaly or masses are present.  There is no cervical lymphadenopathy. I can appreciate no axillary lymphadenopathy. She has a pea-sized right subcutaneous granuloma. There is mild erythema in the axilla suggestive of reactive dermatitis.          Assessment & Plan:  #1 subcutaneous pea-sized nodule; this is most likely related to inspissated poor secondary to contact dermatitis and prior cellulitis. There is no evidence of axillary lymphadenopathy. This lesion is not in axillary wing of the breast.  There is no evidence of active abscess or infection.  Plan: Saline wet-to-dry dressings with avoidance of deodorant until the rash is discontinued. aveeno a daily moisturizing lotion twice a day for dry skin

## 2012-09-06 DIAGNOSIS — J019 Acute sinusitis, unspecified: Secondary | ICD-10-CM | POA: Diagnosis not present

## 2012-09-06 DIAGNOSIS — L049 Acute lymphadenitis, unspecified: Secondary | ICD-10-CM | POA: Diagnosis not present

## 2012-09-09 ENCOUNTER — Ambulatory Visit: Payer: Self-pay | Admitting: Internal Medicine

## 2012-09-16 ENCOUNTER — Ambulatory Visit (INDEPENDENT_AMBULATORY_CARE_PROVIDER_SITE_OTHER): Payer: 59 | Admitting: Internal Medicine

## 2012-09-16 ENCOUNTER — Encounter: Payer: Self-pay | Admitting: Internal Medicine

## 2012-09-16 VITALS — BP 120/70 | HR 94 | Temp 98.2°F | Wt 136.0 lb

## 2012-09-16 DIAGNOSIS — J329 Chronic sinusitis, unspecified: Secondary | ICD-10-CM

## 2012-09-16 MED ORDER — CEFUROXIME AXETIL 250 MG PO TABS: 250.0000 mg | ORAL_TABLET | Freq: Two times a day (BID) | ORAL | Status: DC

## 2012-09-16 NOTE — Patient Instructions (Addendum)
Use T-Gel , a coal tar shampoo, one 2 times per week. This will have an antibacterial effect on scalp lesions. Zicam Melts or Zinc lozenges as per package label for sore throat .

## 2012-09-16 NOTE — Progress Notes (Signed)
  Subjective:    Patient ID: Lorraine Weber, female    DOB: 1980/07/13, 32 y.o.   MRN: [id number]  HPI   Symptoms began one month ago with malaise followed by sinus congestion. She describes dramatic "snottiness" . She describes copious nasal secretions described as green/ brown despite nasal hygiene, Neti pot, and intranasal steroids. She has been having sweats without definite chills or fever.  She has had frontal and facial sinus pain associated with sore throat and otic discomfort.  She noted lymph nodes in both retroauricular areas last weekend. She received a steroid shot at an urgent care.      Review of Systems  She denies extrinsic symptoms of itchy, watery eyes, sneezing. Dental pain has not been significant. There's been no otic discharge  She denies any significant cough, sputum production, shortness of breath, or wheezing despite some postnasal drainage.     Objective:   Physical Exam General appearance:good health ;well nourished; no acute distress or increased work of breathing is present.  Shotty lymphadenopathy is noted in the left cervical chain. I cannot appreciate significant retroauricular lymphadenopathy at this time. No axillary lymphadenopathy.  Eyes: No conjunctival inflammation or lid edema is present.   Ears:  External ear exam shows no significant lesions or deformities.  Otoscopic examination reveals clear canals, tympanic membranes are intact bilaterally without bulging, retraction, inflammation or discharge.  Nose:  External nasal examination shows no deformity or inflammation. Nasal mucosa are pink and moist without lesions or exudates. No septal dislocation or deviation.No obstruction to airflow.  Hyponasal speech  Oral exam: Dental hygiene is good; lips and gums are healthy appearing.There is marked oropharyngeal erythema . No exudate noted.   Neck:  No deformities,  masses, or tenderness noted.     Heart:  Normal rate and regular rhythm. S1 and S2  normal without gallop, murmur, click, rub or other extra sounds.   Lungs:Chest clear to auscultation; no wheezes, rhonchi,rales ,or rubs present.No increased work of breathing.    Extremities:  No cyanosis or clubbing  noted . Trace edema   Skin: Warm & dry .         Assessment & Plan:  #1 protracted frontal/facial sinusitis  #2 lymphadenopathy; the posterior auricular lymphadenopathy suggest possible mastoiditis component.  Plan: See orders and recommendations

## 2012-10-19 ENCOUNTER — Encounter: Payer: Self-pay | Admitting: Internal Medicine

## 2012-10-19 ENCOUNTER — Ambulatory Visit (INDEPENDENT_AMBULATORY_CARE_PROVIDER_SITE_OTHER): Payer: 59 | Admitting: Internal Medicine

## 2012-10-19 VITALS — BP 117/71 | HR 90 | Temp 97.9°F | Wt 133.0 lb

## 2012-10-19 DIAGNOSIS — D6800 Von Willebrand disease, unspecified: Secondary | ICD-10-CM

## 2012-10-19 DIAGNOSIS — J0110 Acute frontal sinusitis, unspecified: Secondary | ICD-10-CM | POA: Diagnosis not present

## 2012-10-19 LAB — CBC WITH DIFFERENTIAL/PLATELET
Basophils Absolute: 0 10*3/uL (ref 0.0–0.1)
Basophils Relative: 0.4 % (ref 0.0–3.0)
Eosinophils Absolute: 0 10*3/uL (ref 0.0–0.7)
Eosinophils Relative: 0.3 % (ref 0.0–5.0)
HCT: 43.6 % (ref 36.0–46.0)
Hemoglobin: 14.7 g/dL (ref 12.0–15.0)
Lymphocytes Relative: 18 % (ref 12.0–46.0)
Lymphs Abs: 1.8 10*3/uL (ref 0.7–4.0)
MCHC: 33.7 g/dL (ref 30.0–36.0)
MCV: 87.7 fl (ref 78.0–100.0)
Monocytes Absolute: 1 10*3/uL (ref 0.1–1.0)
Monocytes Relative: 9.9 % (ref 3.0–12.0)
Neutro Abs: 7 10*3/uL (ref 1.4–7.7)
Neutrophils Relative %: 71.4 % (ref 43.0–77.0)
Platelets: 325 10*3/uL (ref 150.0–400.0)
RBC: 4.97 Mil/uL (ref 3.87–5.11)
RDW: 18.8 % — ABNORMAL HIGH (ref 11.5–14.6)
WBC: 9.8 10*3/uL (ref 4.5–10.5)

## 2012-10-19 MED ORDER — AMOXICILLIN-POT CLAVULANATE 500-125 MG PO TABS: ORAL_TABLET | ORAL | Status: DC

## 2012-10-19 NOTE — Progress Notes (Signed)
  Subjective:    Patient ID: Lorraine Weber, female    DOB: [birth year], 32 y.o.   MRN: [id number]  HPI   Symptoms began 10/15/12 as a sore throat after exposure to a cousin who had respiratory tract infection. This was followed by rhinitis with nonpurulent secretions. As of 9/5 she developed pressure in the frontal and maxillary sinuses. As of 9/6 nasal secretions were purulent. She also had some discharge from the left ear and some dental pain..  She's had a nonproductive cough associated with some shortness of breath and wheezing.     Review of Systems  There were no associated extrinsic symptoms prior to onset of her infection     Objective:   Physical Exam General appearance:adequately nourished; no acute distress or increased work of breathing is present. Some shotty lymph nodes in the left cervical chain; no axillary lymphadenopathy noted  Eyes: No conjunctival inflammation or lid edema is present.   Ears:  External ear exam shows no significant lesions or deformities.  Otoscopic examination reveals essentially clear canals with minimal wax present, tympanic membranes are intact bilaterally without bulging, retraction, inflammation or discharge.  Nose:  External nasal examination shows no deformity or inflammation. Nasal mucosa are pink and moist without lesions or exudates. No septal dislocation or deviation.No obstruction to airflow. Hyponasal speech pattern  Oral exam: Dental hygiene is good; lips and gums are healthy appearing.There is mild oropharyngeal erythema without associated exudate .   Neck:  No deformities,  masses, or tenderness noted.   Supple with full range of motion without pain.   Heart:  Normal rate and regular rhythm. S1 and S2 normal without gallop, murmur, click, rub or other extra sounds. S4 present  Lungs:Chest clear to auscultation; no wheezes, rhonchi,rales ,or rubs present.No increased work of breathing.    Extremities:  No cyanosis, edema, or clubbing   noted    Skin: Warm & dry          Assessment & Plan:  #1 frontal and maxillary rhinosinusitis  #2 cough with some associated shortness of breath and wheezing. Clinically no bronchospasm was noted on exam.  Plan see orders

## 2012-10-19 NOTE — Patient Instructions (Addendum)
Plain Mucinex (NOT D) for thick secretions ;force NON dairy fluids .   Advair  one  inhalation every 12 hours; gargle and spit after use

## 2012-10-26 ENCOUNTER — Encounter: Payer: Self-pay | Admitting: Internal Medicine

## 2012-10-27 ENCOUNTER — Other Ambulatory Visit: Payer: Self-pay | Admitting: *Deleted

## 2012-10-27 NOTE — Telephone Encounter (Signed)
As per Dr. Alfonse Flavors I have called [facility] and filled patient prescription for Augmentin 875 mg and  prednisone 20 mg.  Ag cma

## 2012-11-09 ENCOUNTER — Other Ambulatory Visit: Payer: Self-pay | Admitting: *Deleted

## 2012-11-09 DIAGNOSIS — R059 Cough, unspecified: Secondary | ICD-10-CM

## 2012-11-09 DIAGNOSIS — R062 Wheezing: Secondary | ICD-10-CM

## 2012-11-09 DIAGNOSIS — R05 Cough: Secondary | ICD-10-CM

## 2012-11-09 MED ORDER — AMOXICILLIN-POT CLAVULANATE 875-125 MG PO TABS: 1.0000 | ORAL_TABLET | Freq: Two times a day (BID) | ORAL | Status: DC

## 2012-11-09 MED ORDER — PREDNISONE 20 MG PO TABS: 20.0000 mg | ORAL_TABLET | Freq: Every day | ORAL | Status: DC

## 2012-11-16 DIAGNOSIS — H6690 Otitis media, unspecified, unspecified ear: Secondary | ICD-10-CM | POA: Diagnosis not present

## 2012-11-16 DIAGNOSIS — R51 Headache: Secondary | ICD-10-CM | POA: Diagnosis not present

## 2012-11-30 ENCOUNTER — Encounter: Payer: Self-pay | Admitting: Internal Medicine

## 2012-11-30 ENCOUNTER — Ambulatory Visit (INDEPENDENT_AMBULATORY_CARE_PROVIDER_SITE_OTHER): Payer: 59 | Admitting: Internal Medicine

## 2012-11-30 VITALS — BP 118/64 | HR 82 | Temp 98.2°F | Resp 12 | Wt 135.0 lb

## 2012-11-30 DIAGNOSIS — H6690 Otitis media, unspecified, unspecified ear: Secondary | ICD-10-CM | POA: Diagnosis not present

## 2012-11-30 DIAGNOSIS — J0110 Acute frontal sinusitis, unspecified: Secondary | ICD-10-CM | POA: Diagnosis not present

## 2012-11-30 DIAGNOSIS — H7090 Unspecified mastoiditis, unspecified ear: Secondary | ICD-10-CM

## 2012-11-30 DIAGNOSIS — H6692 Otitis media, unspecified, left ear: Secondary | ICD-10-CM

## 2012-11-30 DIAGNOSIS — H7092 Unspecified mastoiditis, left ear: Secondary | ICD-10-CM

## 2012-11-30 LAB — CBC WITH DIFFERENTIAL/PLATELET
Basophils Absolute: 0 10*3/uL (ref 0.0–0.1)
Basophils Relative: 0.3 % (ref 0.0–3.0)
Eosinophils Absolute: 0 10*3/uL (ref 0.0–0.7)
Eosinophils Relative: 0.3 % (ref 0.0–5.0)
HCT: 41.9 % (ref 36.0–46.0)
Hemoglobin: 13.9 g/dL (ref 12.0–15.0)
Lymphocytes Relative: 22.2 % (ref 12.0–46.0)
Lymphs Abs: 1.7 10*3/uL (ref 0.7–4.0)
MCHC: 33.1 g/dL (ref 30.0–36.0)
MCV: 88.1 fl (ref 78.0–100.0)
Monocytes Absolute: 0.7 10*3/uL (ref 0.1–1.0)
Monocytes Relative: 8.7 % (ref 3.0–12.0)
Neutro Abs: 5.3 10*3/uL (ref 1.4–7.7)
Neutrophils Relative %: 68.5 % (ref 43.0–77.0)
Platelets: 292 10*3/uL (ref 150.0–400.0)
RBC: 4.76 Mil/uL (ref 3.87–5.11)
RDW: 19.3 % — ABNORMAL HIGH (ref 11.5–14.6)
WBC: 7.7 10*3/uL (ref 4.5–10.5)

## 2012-11-30 MED ORDER — NEOMYCIN-POLYMYXIN-HC 3.5-10000-1 OT SOLN: 3.0000 [drp] | Freq: Four times a day (QID) | OTIC | Status: DC

## 2012-11-30 NOTE — Patient Instructions (Signed)
Flonase 1 spray into L nostril twice a day . Use the "crossover" technique as discussed

## 2012-11-30 NOTE — Progress Notes (Signed)
  Subjective:    Patient ID: Lorraine Weber, female    DOB: 10/14/1980, 32 y.o.   MRN: [id number]  HPI  She was switched from cefuroxime to generic Augmentin and prednisone was added because of persistent symptoms of rhinosinusitis. She now has discomfort in the left ear over past 2 weeks associated with nausea and dizziness. There's been no otic discharge  She's had some clear drainage from her eyes without any associated purulence. She continues to have frankly purulent secretions from the nose with yellow-brown color.  She has had low-grade fevers. She continues to have discomfort in the left frontal and left facial sinus areas.      Review of Systems  The cough associated wheezing have improved significantly.     Objective:   Physical Exam General appearance:well nourished; no acute distress or increased work of breathing is present but she appears uncomfortable.  No  lymphadenopathy about the head, neck, or axilla noted.   Eyes: No conjunctival inflammation or lid edema is present. Chronic vision loss OS  Ears:  External ear exam shows no significant lesions or deformities.  Otoscopic examination reveals normal right TM. There is some laxity left canal which partially tumors the tympanic membrane. Tenderness to palpation over the left mastoid and over the left frontal and maxillary sinus areas Nose:  External nasal examination shows no deformity or inflammation. Nasal mucosa are dry without lesions or exudates. No septal dislocation or deviation.No obstruction to airflow.   Oral exam: Dental hygiene is good; lips and gums are healthy appearing.There is no oropharyngeal erythema or exudate noted.   Neck:  No deformities, , masses, or tenderness noted.   Some decreased lateral  range of motion especially to L.   Heart:  Normal rate and regular rhythm. S1 and S2 normal without gallop, murmur, click, rub or other extra sounds.   Lungs: Low-grade late inspiratory pops and wheezing  without increased work of breathing.  Extremities:  No cyanosis, edema, or clubbing  noted    Skin: Warm & dry          Assessment & Plan:  #1 clinically she has left frontal, facial sinusitis as well as mastoiditis. This is not responded to Augmentin &  steroids.  Plan: Imaging indicated.

## 2012-12-03 ENCOUNTER — Ambulatory Visit
Admission: RE | Admit: 2012-12-03 | Discharge: 2012-12-03 | Disposition: A | Discharge status: Home / Self Care | Payer: 59 | Source: Ambulatory Visit | Attending: Internal Medicine | Admitting: Internal Medicine

## 2012-12-03 DIAGNOSIS — J0110 Acute frontal sinusitis, unspecified: Secondary | ICD-10-CM

## 2012-12-17 ENCOUNTER — Emergency Department (HOSPITAL_BASED_OUTPATIENT_CLINIC_OR_DEPARTMENT_OTHER): Payer: 59

## 2012-12-17 ENCOUNTER — Emergency Department (HOSPITAL_BASED_OUTPATIENT_CLINIC_OR_DEPARTMENT_OTHER)
Admission: EM | Admit: 2012-12-17 | Discharge: 2012-12-17 | Disposition: A | Discharge status: Home / Self Care | Payer: 59 | Attending: Emergency Medicine | Admitting: Emergency Medicine

## 2012-12-17 ENCOUNTER — Other Ambulatory Visit: Payer: Self-pay

## 2012-12-17 ENCOUNTER — Encounter (HOSPITAL_BASED_OUTPATIENT_CLINIC_OR_DEPARTMENT_OTHER): Payer: Self-pay | Admitting: Emergency Medicine

## 2012-12-17 DIAGNOSIS — Z8711 Personal history of peptic ulcer disease: Secondary | ICD-10-CM | POA: Diagnosis not present

## 2012-12-17 DIAGNOSIS — Z87828 Personal history of other (healed) physical injury and trauma: Secondary | ICD-10-CM | POA: Insufficient documentation

## 2012-12-17 DIAGNOSIS — Z3202 Encounter for pregnancy test, result negative: Secondary | ICD-10-CM | POA: Insufficient documentation

## 2012-12-17 DIAGNOSIS — G43909 Migraine, unspecified, not intractable, without status migrainosus: Secondary | ICD-10-CM | POA: Diagnosis not present

## 2012-12-17 DIAGNOSIS — Z79899 Other long term (current) drug therapy: Secondary | ICD-10-CM | POA: Insufficient documentation

## 2012-12-17 DIAGNOSIS — Z87442 Personal history of urinary calculi: Secondary | ICD-10-CM | POA: Diagnosis not present

## 2012-12-17 DIAGNOSIS — F17200 Nicotine dependence, unspecified, uncomplicated: Secondary | ICD-10-CM | POA: Diagnosis not present

## 2012-12-17 DIAGNOSIS — Z862 Personal history of diseases of the blood and blood-forming organs and certain disorders involving the immune mechanism: Secondary | ICD-10-CM | POA: Insufficient documentation

## 2012-12-17 DIAGNOSIS — Z8669 Personal history of other diseases of the nervous system and sense organs: Secondary | ICD-10-CM | POA: Insufficient documentation

## 2012-12-17 DIAGNOSIS — M542 Cervicalgia: Secondary | ICD-10-CM | POA: Insufficient documentation

## 2012-12-17 DIAGNOSIS — R42 Dizziness and giddiness: Secondary | ICD-10-CM | POA: Diagnosis not present

## 2012-12-17 DIAGNOSIS — R51 Headache: Secondary | ICD-10-CM | POA: Diagnosis not present

## 2012-12-17 LAB — CBC
HCT: 41.9 % (ref 36.0–46.0)
Hemoglobin: 14.5 g/dL (ref 12.0–15.0)
MCH: 29.4 pg (ref 26.0–34.0)
MCHC: 34.6 g/dL (ref 30.0–36.0)
MCV: 85 fL (ref 78.0–100.0)
Platelets: 282 10*3/uL (ref 150–400)
RBC: 4.93 MIL/uL (ref 3.87–5.11)
RDW: 17.6 % — ABNORMAL HIGH (ref 11.5–15.5)
WBC: 7.5 10*3/uL (ref 4.0–10.5)

## 2012-12-17 LAB — URINALYSIS, ROUTINE W REFLEX MICROSCOPIC
Bilirubin Urine: NEGATIVE
Glucose, UA: NEGATIVE mg/dL
Hgb urine dipstick: NEGATIVE
Ketones, ur: NEGATIVE mg/dL
Leukocytes, UA: NEGATIVE
Nitrite: NEGATIVE
Protein, ur: NEGATIVE mg/dL
Specific Gravity, Urine: 1.011 (ref 1.005–1.030)
Urobilinogen, UA: 0.2 mg/dL (ref 0.0–1.0)
pH: 7 (ref 5.0–8.0)

## 2012-12-17 LAB — BASIC METABOLIC PANEL
BUN: 8 mg/dL (ref 6–23)
CO2: 26 mEq/L (ref 19–32)
Calcium: 9.2 mg/dL (ref 8.4–10.5)
Chloride: 99 mEq/L (ref 96–112)
Creatinine, Ser: 0.7 mg/dL (ref 0.50–1.10)
GFR calc Af Amer: >90 mL/min (ref >90)
GFR calc non Af Amer: >90 mL/min (ref >90)
Glucose, Bld: 84 mg/dL (ref 70–99)
Potassium: 3.3 mEq/L — ABNORMAL LOW (ref 3.5–5.1)
Sodium: 136 mEq/L (ref 135–145)

## 2012-12-17 LAB — PREGNANCY, URINE: Preg Test, Ur: NEGATIVE

## 2012-12-17 MED ORDER — METOCLOPRAMIDE HCL 5 MG/ML IJ SOLN
10.0000 mg | Freq: Once | INTRAMUSCULAR | Status: AC
  Administered 2012-12-17: 10 mg via INTRAVENOUS
  Filled 2012-12-17: qty 2

## 2012-12-17 MED ORDER — DIPHENHYDRAMINE HCL 50 MG/ML IJ SOLN
25.0000 mg | Freq: Once | INTRAMUSCULAR | Status: AC
  Administered 2012-12-17: 25 mg via INTRAVENOUS
  Filled 2012-12-17: qty 1

## 2012-12-17 MED ORDER — SODIUM CHLORIDE 0.9 % IV BOLUS (SEPSIS)
2000.0000 mL | Freq: Once | INTRAVENOUS | Status: AC
  Administered 2012-12-17: 2000 mL via INTRAVENOUS

## 2012-12-17 MED ORDER — ONDANSETRON 4 MG PO TBDP: ORAL_TABLET | ORAL | Status: DC

## 2012-12-17 NOTE — ED Notes (Signed)
C/o ha x 14 days  Worse last 2 days,  States has ha all the time from head trauma but is worse having n/v  Suppositories not working for vomiting  Per family acting normal
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
## 2012-12-17 NOTE — ED Notes (Signed)
Headache 14 days. Hx of head injury with daily headaches. She is also vomiting with the head pain.

## 2012-12-24 ENCOUNTER — Telehealth: Payer: Self-pay | Admitting: Internal Medicine

## 2012-12-24 NOTE — Telephone Encounter (Signed)
Patient called and wanted to see if Dr Alwyn Ren would refer her to [facility] to see Dr Ermalinda Barrios

## 2012-12-26 DIAGNOSIS — G43109 Migraine with aura, not intractable, without status migrainosus: Secondary | ICD-10-CM | POA: Diagnosis not present

## 2012-12-26 DIAGNOSIS — Z79899 Other long term (current) drug therapy: Secondary | ICD-10-CM | POA: Diagnosis not present

## 2012-12-26 DIAGNOSIS — F17200 Nicotine dependence, unspecified, uncomplicated: Secondary | ICD-10-CM | POA: Diagnosis not present

## 2012-12-26 DIAGNOSIS — Z87828 Personal history of other (healed) physical injury and trauma: Secondary | ICD-10-CM | POA: Diagnosis not present

## 2012-12-29 ENCOUNTER — Encounter: Payer: Self-pay | Admitting: Internal Medicine

## 2012-12-29 ENCOUNTER — Ambulatory Visit (INDEPENDENT_AMBULATORY_CARE_PROVIDER_SITE_OTHER): Payer: 59 | Admitting: Internal Medicine

## 2012-12-29 VITALS — BP 123/77 | HR 83 | Temp 98.4°F | Ht 65.25 in | Wt 135.2 lb

## 2012-12-29 DIAGNOSIS — R51 Headache: Secondary | ICD-10-CM

## 2012-12-29 DIAGNOSIS — R112 Nausea with vomiting, unspecified: Secondary | ICD-10-CM

## 2012-12-29 DIAGNOSIS — G43009 Migraine without aura, not intractable, without status migrainosus: Secondary | ICD-10-CM | POA: Diagnosis not present

## 2012-12-29 DIAGNOSIS — R519 Headache, unspecified: Secondary | ICD-10-CM

## 2012-12-29 DIAGNOSIS — D6800 Von Willebrand disease, unspecified: Secondary | ICD-10-CM

## 2012-12-29 DIAGNOSIS — I10 Essential (primary) hypertension: Secondary | ICD-10-CM | POA: Diagnosis not present

## 2012-12-29 DIAGNOSIS — E860 Dehydration: Secondary | ICD-10-CM | POA: Diagnosis not present

## 2012-12-29 DIAGNOSIS — IMO0002 Reserved for concepts with insufficient information to code with codable children: Secondary | ICD-10-CM | POA: Diagnosis not present

## 2012-12-29 NOTE — Progress Notes (Signed)
Subjective:    Patient ID: Lorraine Weber, female    DOB: 1980-07-24, 32 y.o.   MRN: [id number]  HPI  She was seen 12/17/12 at the  [facility] emergency room for exacerbation of her migraine headaches. CAT scan was done because of her potential bleeding dyscrasia. This was normal.  Symptoms recurred 12/26/12 and she was seen at [facility] emergency room and given IV fluids and Benadryl. That episode was associated with loss of vision in the left eye over 20 minutes , tinnitus and numbness of the left face and left side of her body.   Her complicated past history was reviewed. She's had migraine since age 31. These changed drastically after head trauma in 2010. Since that time she's had severe migraine variant headaches for which she's been seen by several neurologists. Therapies have included Botox (complicated by MRSA infection); Topamax (syncope X 3 while on this),calcium channel blockers; beta blockers; anti seizure meds and SSRIs without significant response.  Tryptophans have not initiated because of concern of possible anticoagulant effect on her pre-existing von Willebrand's disease    Review of Systems  She continues to have some numbness in the left face and left hand & constant tinnitus. She also describes difficulty focusing. Nausea persists ; Phenergan works better than Zofran     Objective:   Physical Exam Gen.: Thin but healthy and well-nourished in appearance. Affect slightly flat,  cooperative throughout exam.  Head: Normocephalic without obvious abnormalities Eyes: No corneal or conjunctival inflammation noted. Ptosis OS . Extraocular motion intact. Ears: External  ear exam reveals no significant lesions or deformities. Canals clear .TMs normal. Hearing is grossly normal bilaterally. Tuning fork localizes to R.AC > BC bilaterally Nose: External nasal exam reveals no deformity or inflammation. Nasal mucosa are dry. No lesions or exudates noted.   Mouth: Oral mucosa and  oropharynx reveal no lesions or exudates. Teeth in good repair. Neck: No deformities, masses, or tenderness noted. Range of motion slightly decreased. Lungs: Normal respiratory effort; chest expands symmetrically. Lungs are clear to auscultation without rales, wheezes, or increased work of breathing. Heart: Normal rate and rhythm. Normal S1 and S2. No gallop, click, or rub.No murmur. Abdomen: Bowel sounds decreased; abdomen soft and nontender. No masses, organomegaly or hernias noted.                              Musculoskeletal/extremities: No clubbing, cyanosis, or significant extremity  deformity noted. Trace ankle edema.Range of motion normal .Decreased tone & strength especially LLE & to opposition in fingers. Hand joints normal . Fingernail health good. Negative SLR. Sits w/o help Vascular: Carotid, radial artery, dorsalis pedis and  posterior tibial pulses are full and equal. No bruits present. Neurologic: Alert and oriented x3. Deep tendon reflexes symmetrical and normal. Asymmetry to light touch over the  face and hands. She is unable to describe the exact nature of the asymmetric sensation.  Rhomberg normal;minimal past pointing with finger to nose       Skin: Intact without suspicious lesions or rashes. Lymph: No cervical, axillary, or inguinal lymphadenopathy present. Psych: Mood and affect are normal. Normally interactive  Assessment & Plan:  #1 intractable headache #2 intractable nausea #3 history of von Tonita Cong was concerned that tryptans might act as an anticoagulant. No assessment of this hematologic condition since 2006 in [place]  Plan :  I recommend evaluation by hematologist with special reference to potential risk of tryptans for her headaches

## 2012-12-29 NOTE — Assessment & Plan Note (Signed)
Reassessment recommended by Dr Cyndie Chime or @ [facility]

## 2012-12-29 NOTE — Progress Notes (Signed)
Pre visit review using our clinic review tool, if applicable. No additional management support is needed unless otherwise documented below in the visit note. 

## 2012-12-29 NOTE — Patient Instructions (Addendum)
Stay on clear liquids for 48-72 hours .This would include  jello, sherbert (NOT ice cream), Lipton's chicken noodle soup(NOT cream based soups),Gatorade Lite, flat Ginger ale (without High Fructose Corn Syrup),dry toast or crackers, baked potato.No milk , dairy or grease .  I recommend a Hematology consultation to determine optimal therapy of headaches  in view of PMH of von Willebrand's .

## 2012-12-30 ENCOUNTER — Telehealth: Payer: Self-pay | Admitting: Oncology

## 2012-12-30 LAB — BASIC METABOLIC PANEL
BUN: 8 mg/dL (ref 6–23)
CO2: 24 mEq/L (ref 19–32)
Calcium: 9.1 mg/dL (ref 8.4–10.5)
Chloride: 102 mEq/L (ref 96–112)
Creatinine, Ser: 0.8 mg/dL (ref 0.4–1.2)
GFR: 90.9 mL/min (ref >60.00)
Glucose, Bld: 79 mg/dL (ref 70–99)
Potassium: 4.4 mEq/L (ref 3.5–5.1)
Sodium: 135 mEq/L (ref 135–145)

## 2012-12-30 LAB — ALT: ALT: 9 U/L (ref 0–35)

## 2012-12-30 LAB — AST: AST: 19 U/L (ref 0–37)

## 2012-12-30 NOTE — Telephone Encounter (Signed)
lvom for pt to return call in re to referral.  °

## 2012-12-30 NOTE — Telephone Encounter (Signed)
C/D 12/30/12 for appt. 02/24/13

## 2012-12-30 NOTE — Telephone Encounter (Signed)
S/w pt and gve np appt 01/14 @3  Dr. Cyndie Chime Referring Dr. Alwyn Ren Dx- Darrick Meigs Dx Welcome packet mailed.

## 2013-01-01 ENCOUNTER — Encounter (HOSPITAL_COMMUNITY): Payer: Self-pay | Admitting: Emergency Medicine

## 2013-01-01 ENCOUNTER — Telehealth: Payer: Self-pay

## 2013-01-01 ENCOUNTER — Emergency Department (HOSPITAL_COMMUNITY): Payer: 59

## 2013-01-01 ENCOUNTER — Emergency Department (HOSPITAL_COMMUNITY)
Admission: EM | Admit: 2013-01-01 | Discharge: 2013-01-02 | Disposition: A | Discharge status: Home / Self Care | Payer: 59 | Attending: Emergency Medicine | Admitting: Emergency Medicine

## 2013-01-01 DIAGNOSIS — Z8719 Personal history of other diseases of the digestive system: Secondary | ICD-10-CM | POA: Insufficient documentation

## 2013-01-01 DIAGNOSIS — G8190 Hemiplegia, unspecified affecting unspecified side: Secondary | ICD-10-CM

## 2013-01-01 DIAGNOSIS — IMO0002 Reserved for concepts with insufficient information to code with codable children: Secondary | ICD-10-CM | POA: Diagnosis not present

## 2013-01-01 DIAGNOSIS — Z79899 Other long term (current) drug therapy: Secondary | ICD-10-CM | POA: Diagnosis not present

## 2013-01-01 DIAGNOSIS — R5381 Other malaise: Secondary | ICD-10-CM | POA: Diagnosis not present

## 2013-01-01 DIAGNOSIS — G932 Benign intracranial hypertension: Secondary | ICD-10-CM

## 2013-01-01 DIAGNOSIS — G43109 Migraine with aura, not intractable, without status migrainosus: Secondary | ICD-10-CM

## 2013-01-01 DIAGNOSIS — Z87442 Personal history of urinary calculi: Secondary | ICD-10-CM | POA: Diagnosis not present

## 2013-01-01 DIAGNOSIS — G43409 Hemiplegic migraine, not intractable, without status migrainosus: Secondary | ICD-10-CM | POA: Diagnosis not present

## 2013-01-01 DIAGNOSIS — F17200 Nicotine dependence, unspecified, uncomplicated: Secondary | ICD-10-CM | POA: Diagnosis not present

## 2013-01-01 DIAGNOSIS — Z862 Personal history of diseases of the blood and blood-forming organs and certain disorders involving the immune mechanism: Secondary | ICD-10-CM | POA: Diagnosis not present

## 2013-01-01 DIAGNOSIS — R209 Unspecified disturbances of skin sensation: Secondary | ICD-10-CM | POA: Diagnosis not present

## 2013-01-01 DIAGNOSIS — G43909 Migraine, unspecified, not intractable, without status migrainosus: Secondary | ICD-10-CM | POA: Diagnosis not present

## 2013-01-01 DIAGNOSIS — R51 Headache: Secondary | ICD-10-CM | POA: Diagnosis present

## 2013-01-01 LAB — CBC WITH DIFFERENTIAL/PLATELET
Basophils Absolute: 0 10*3/uL (ref 0.0–0.1)
Basophils Relative: 0 % (ref 0–1)
Eosinophils Absolute: 0 10*3/uL (ref 0.0–0.7)
Eosinophils Relative: 1 % (ref 0–5)
HCT: 41.5 % (ref 36.0–46.0)
Hemoglobin: 14.6 g/dL (ref 12.0–15.0)
Lymphocytes Relative: 34 % (ref 12–46)
Lymphs Abs: 2.6 10*3/uL (ref 0.7–4.0)
MCH: 30.2 pg (ref 26.0–34.0)
MCHC: 35.2 g/dL (ref 30.0–36.0)
MCV: 85.7 fL (ref 78.0–100.0)
Monocytes Absolute: 0.7 10*3/uL (ref 0.1–1.0)
Monocytes Relative: 9 % (ref 3–12)
Neutro Abs: 4.4 10*3/uL (ref 1.7–7.7)
Neutrophils Relative %: 56 % (ref 43–77)
Platelets: 331 10*3/uL (ref 150–400)
RBC: 4.84 MIL/uL (ref 3.87–5.11)
RDW: 17.5 % — ABNORMAL HIGH (ref 11.5–15.5)
WBC: 7.7 10*3/uL (ref 4.0–10.5)

## 2013-01-01 LAB — POCT I-STAT, CHEM 8
BUN: 7 mg/dL (ref 6–23)
Calcium, Ion: 1.13 mmol/L (ref 1.12–1.23)
Chloride: 99 mEq/L (ref 96–112)
Creatinine, Ser: 1 mg/dL (ref 0.50–1.10)
Glucose, Bld: 92 mg/dL (ref 70–99)
HCT: 46 % (ref 36.0–46.0)
Hemoglobin: 15.6 g/dL — ABNORMAL HIGH (ref 12.0–15.0)
Potassium: 3.5 mEq/L (ref 3.5–5.1)
Sodium: 139 mEq/L (ref 135–145)
TCO2: 26 mmol/L (ref 0–100)

## 2013-01-01 LAB — PROTIME-INR
INR: 1.05 (ref 0.00–1.49)
Prothrombin Time: 13.5 seconds (ref 11.6–15.2)

## 2013-01-01 MED ORDER — METOCLOPRAMIDE HCL 5 MG/ML IJ SOLN
10.0000 mg | Freq: Once | INTRAMUSCULAR | Status: AC
  Administered 2013-01-01: 10 mg via INTRAVENOUS
  Filled 2013-01-01: qty 2

## 2013-01-01 MED ORDER — DIPHENHYDRAMINE HCL 50 MG/ML IJ SOLN
12.5000 mg | Freq: Once | INTRAMUSCULAR | Status: AC
  Administered 2013-01-01: 12.5 mg via INTRAVENOUS
  Filled 2013-01-01: qty 1

## 2013-01-01 MED ORDER — VALPROATE SODIUM 500 MG/5ML IV SOLN
500.0000 mg | Freq: Once | INTRAVENOUS | Status: AC
  Administered 2013-01-02: 500 mg via INTRAVENOUS
  Filled 2013-01-01: qty 5

## 2013-01-01 NOTE — Telephone Encounter (Signed)
MSG left on Vm advising the patient was not doing to good.    I called the patient and she stated she lost a day, she said she never woke up yesterday, still having a HA, she said when she got up today her left leg is dragging and having trouble walking. I asked where her spouse was ans the patient said it was not in the home at this time.  After discussing with [facility] the patient has been advised to go to the ED for an evaluation. She agreed to do so      KP

## 2013-01-01 NOTE — ED Notes (Signed)
Patients primary doctor Dr. Alwyn Ren requested patient be seen tonight d/t patient not being able to recall an entire day (11/20). Patient is also experiencing new onset of left sided weakness and left sided sensory loss. Patient has a hx of brain injury. Patient has experienced an increase in emesis even with phenergan suppositories.

## 2013-01-01 NOTE — ED Notes (Signed)
Dr. Reynolds at BS.  

## 2013-01-01 NOTE — ED Notes (Signed)
Patient transported to MR. 

## 2013-01-01 NOTE — ED Notes (Signed)
Spoke to pcp, and told to come here for migraines.  12/30/12: loss of vision in left eye, and now has blurry vision; 11/20: left sided weakness that is resolving; tongue midline; still having persistent migraine symptoms.

## 2013-01-01 NOTE — ED Provider Notes (Signed)
CSN: [id number]     Arrival date & time 01/01/13  1806 History   First MD Initiated Contact with Patient 01/01/13 2104     Chief Complaint  Patient presents with  . Migraine   (Consider location/radiation/quality/duration/timing/severity/associated sxs/prior Treatment) HPI Comments: Patient with a history of headache, that is constant since a trauma in 2000 and, 10.  She's been followed by neurology and primary care. In the past, month.  She's had 2 CT scans, which have been normal.  Last being 12/17/2012 Patient presents tonight with worsening headache, left-sided weakness.  That has been intermittent, persistent and worse in the last week.  This morning.  She woke with left leg weakness, which is the newest symptom. She has an appointment with her neurologist, on Monday.  She spoke with Dr. Alwyn Ren her primary care physician, who recommended an ED evaluation.  Tonight. She has also been referred to Dr. Marlena Clipper for workup of her von Willebrand's disease  Patient is a 32 y.o. female presenting with migraines. The history is provided by the patient.  Migraine This is a chronic problem. The problem occurs constantly. The problem has been gradually worsening. Associated symptoms include headaches, nausea, numbness and weakness. Pertinent negatives include no chest pain, chills, fever or neck pain. Nothing aggravates the symptoms. She has tried nothing for the symptoms. The treatment provided no relief.    Past Medical History  Diagnosis Date  . Migraines   . Von Willebrand disease   . Anemia   . Nephrolithiasis     X5, Dr Annabell Howells  . Peptic ulcer disease   . Concussion     x8, last 2010, residual homonymous Hemianopsia  . Migraines   . Mastoiditis    Past Surgical History  Procedure Laterality Date  . Appendectomy    . Hand tendon surgery      left, complicated by MRSA   Family History  Problem Relation Age of Onset  . Arthritis Father   . Hyperlipidemia Father   . Diabetes  Father   . Stroke Father     PTE post CVA  . Prostate cancer Paternal Grandfather   . Hyperlipidemia Paternal Grandfather   . Diabetes Paternal Grandfather   . Heart disease Mother   . Heart disease Maternal Grandmother   . Diabetes Maternal Grandmother   . Heart disease Maternal Grandfather   . Heart disease Maternal Aunt   . Stroke Maternal Aunt   . Heart disease Maternal Uncle   . Heart disease Brother    History  Substance Use Topics  . Smoking status: Current Some Day Smoker -- 0.30 packs/day for 10 years    Types: Cigarettes  . Smokeless tobacco: Never Used     Comment: 1 cig daily, trying to quit   . Alcohol Use: No   OB History   Grav Para Term Preterm Abortions TAB SAB Ect Mult Living                 Review of Systems  Constitutional: Negative for fever and chills.  Eyes: Positive for photophobia.  Respiratory: Negative for shortness of breath.   Cardiovascular: Negative for chest pain and leg swelling.  Gastrointestinal: Positive for nausea.  Musculoskeletal: Negative for neck pain.  Neurological: Positive for weakness, numbness and headaches. Negative for dizziness and light-headedness.  All other systems reviewed and are negative.    Allergies  Aspirin; Levofloxacin; Other; and Topamax  Home Medications   Current Outpatient Rx  Name  Route  Sig  Dispense  Refill  . butalbital-acetaminophen-caffeine (ESGIC) 50-325-40 MG per tablet   Oral   Take 1 tablet by mouth as needed for migraine.          . diltiazem (TIAZAC) 360 MG 24 hr capsule   Oral   Take 360 mg by mouth daily.         Marland Kitchen FLUoxetine (PROZAC) 20 MG tablet   Oral   Take 20 mg by mouth daily.         . fluticasone (FLONASE) 50 MCG/ACT nasal spray   Nasal   Place 1 spray into the nose 2 (two) times daily. 1 spray in each nostril once daily . Use the "crossover" technique as discussed   16 g   11   . levonorgestrel (MIRENA) 20 MCG/24HR IUD   Intrauterine   1 each by Intrauterine  route once.         Marland Kitchen losartan-hydrochlorothiazide (HYZAAR) 100-25 MG per tablet   Oral   Take 1 tablet by mouth daily.         Marland Kitchen omeprazole (PRILOSEC) 20 MG capsule   Oral   Take 20 mg by mouth daily.         . promethazine (PHENERGAN) 12.5 MG tablet   Oral   Take 12.5 mg by mouth every 6 (six) hours as needed for nausea or vomiting.          BP 121/76  Pulse 62  Temp(Src) 97.7 F (36.5 C) (Oral)  Resp 14  Ht 5\' 5"  (1.651 m)  Wt 134 lb (60.782 kg)  BMI 22.30 kg/m2  SpO2 100%  LMP 11/30/2012 Physical Exam  Constitutional: She is oriented to person, place, and time. She appears well-developed and well-nourished.  HENT:  Head: Normocephalic.  Right Ear: External ear normal.  Left Ear: External ear normal.  Eyes: Pupils are equal, round, and reactive to light.  Left eye upper lid droopy/ptosis.  Which has been states, is worse than normal  Cardiovascular: Regular rhythm.   Pulmonary/Chest: Effort normal.  Abdominal: Bowel sounds are normal.  Musculoskeletal: She exhibits no tenderness.  Neurological: She is alert and oriented to person, place, and time. She displays abnormal reflex. She displays no atrophy. A cranial nerve deficit and sensory deficit is present. She exhibits abnormal muscle tone. She displays no seizure activity. Coordination and gait abnormal.  Patient has weakness to the left upper extremity, left lower extremity, decreased.  DTRs on the left decreased sensation on the left abnormal gait listing to the right    ED Course  Procedures (including critical care time) Labs Review Labs Reviewed  CBC WITH DIFFERENTIAL - Abnormal; Notable for the following:    RDW 17.5 (*)    All other components within normal limits  POCT I-STAT, CHEM 8 - Abnormal; Notable for the following:    Hemoglobin 15.6 (*)    All other components within normal limits  PROTIME-INR   Imaging Review Mr Brain Wo Contrast  01/01/2013   CLINICAL DATA:  Migraine, left-sided  facial numbness  EXAM: MRI HEAD WITHOUT CONTRAST  TECHNIQUE: Multiplanar, multiecho pulse sequences of the brain and surrounding structures were obtained without intravenous contrast.  COMPARISON:  Prior CT from 12/17/2012  FINDINGS: No focal parenchymal signal abnormality is identified within the brain. There is no mass or midline shift. CSF containing spaces are within normal limits. Ventricles are normal in size without evidence of hydrocephalus. There is no extra-axial fluid collection.  No diffusion-weighted signal abnormality is identified to suggest acute intracranial  infarct. No intracranial hemorrhage seen on the gradient echo sequence. Normal intravascular flow voids are seen throughout the intracranial vasculature.  The craniocervical junction is within normal limits. Pituitary gland is normal. The globes, optic nerves, and optic chiasm are within normal limits. Corpus callosum is normal.  The calvarium demonstrates a normal appearance with normal signal intensity. Scalp soft tissues are normal.  The paranasal sinuses are clear.  No mastoid effusion.  IMPRESSION: Normal MRI of the brain.   Electronically Signed   By: Rise Mu M.D.   On: 01/01/2013 22:50    EKG Interpretation   None       MDM   1. Complicated migraine   2. Hemiplegia, unspecified, affecting nondominant side   3. Migraine, hemiplegic      patient's primary care physician, Dr. Alwyn Ren as requested the patient.  Not receive more CT scan, that she's had 2 negative scans in the last 21 days. Patient states, that every medication, that she has taken in the past has offered no relief of her discomfort.  She is not to take any medication that might incite a blood thinner.  Due to her von Willebrand's the only, thing she's taking for her headache in the past several days.  Is Prozac  DR. Reynold reviewed the MRI  Ordered Depakote IV and DC home to see PCP and neurologist as scheduled on Monday   Arman Filter,  NP 01/02/13 0201

## 2013-01-02 DIAGNOSIS — G8190 Hemiplegia, unspecified affecting unspecified side: Secondary | ICD-10-CM

## 2013-01-02 DIAGNOSIS — G43109 Migraine with aura, not intractable, without status migrainosus: Secondary | ICD-10-CM

## 2013-01-02 DIAGNOSIS — G932 Benign intracranial hypertension: Secondary | ICD-10-CM

## 2013-01-02 DIAGNOSIS — G43409 Hemiplegic migraine, not intractable, without status migrainosus: Secondary | ICD-10-CM | POA: Diagnosis not present

## 2013-01-02 NOTE — Consult Note (Signed)
Reason for Consult: Headache with left sided weakness/numbness Referring Physician: Bebe Shaggy  CC: Headache with left sided weakness/numbness  HPI: Lorraine Weber is an 32 y.o. female with a long history of migraines.  Became daily after head trauma in 2010.  She usually awakens daily with a severe headache associated with nausea, vomiting at times, photophobia and phonophobia.  Headache is usually over the left eye.  On Saturday she awakened with a headache over the right eye and was blind out of the left eye.  The vision improved to a blurriness after about thirty minutes but she also developed left facial droop and left sided numbness and weakness.  These symptoms have persisted since Saturday.  She has sought medical attention on two occasions when a head CT was performed.  Both were normal.  Patient also reports that she lost a day this week.  She just can not remember Thursday at all.  It is as if it never happened.   Headache treatments in the past have included analgesics, steroids, Topamax, Calcium channel blockers, beta blockers, AED's and SSRI's.    Past Medical History  Diagnosis Date  . Migraines   . Von Willebrand disease   . Anemia   . Nephrolithiasis     X5, Dr Annabell Howells  . Peptic ulcer disease   . Concussion     x8, last 2010, residual homonymous Hemianopsia  . Migraines   . Mastoiditis     Past Surgical History  Procedure Laterality Date  . Appendectomy    . Hand tendon surgery      left, complicated by MRSA    Family History  Problem Relation Age of Onset  . Arthritis Father   . Hyperlipidemia Father   . Diabetes Father   . Stroke Father     PTE post CVA  . Prostate cancer Paternal Grandfather   . Hyperlipidemia Paternal Grandfather   . Diabetes Paternal Grandfather   . Heart disease Mother   . Heart disease Maternal Grandmother   . Diabetes Maternal Grandmother   . Heart disease Maternal Grandfather   . Heart disease Maternal Aunt   . Stroke Maternal Aunt   .  Heart disease Maternal Uncle   . Heart disease Brother     Social History:  reports that she has been smoking Cigarettes.  She has a 3 pack-year smoking history. She has never used smokeless tobacco. She reports that she does not drink alcohol or use illicit drugs.  Allergies  Allergen Reactions  . Aspirin Other (See Comments)    REACTION: ANY BLOOD THINNERS/ DUE TO CURRENT BLOOD DISORDER  . Levofloxacin Itching and Rash  . Other Other (See Comments)    ANY BLOOD THINNERS-CANNOT TAKE AT ALL  . Topamax [Topiramate] Other (See Comments)    HYPOTENSION AND SYNCOPE     Medications: I have reviewed the patient's current medications. Prior to Admission:  Current outpatient prescriptions: butalbital-acetaminophen-caffeine (ESGIC) 50-325-40 MG per tablet, Take 1 tablet by mouth as needed for migraine. , Disp: , Rfl: ;   diltiazem (TIAZAC) 360 MG 24 hr capsule, Take 360 mg by mouth daily., Disp: , Rfl: ;   FLUoxetine (PROZAC) 20 MG tablet, Take 20 mg by mouth daily., Disp: , Rfl:  fluticasone (FLONASE) 50 MCG/ACT nasal spray, Place 1 spray into the nose 2 (two) times daily. 1 spray in each nostril once daily . Use the "crossover" technique as discussed, Disp: 16 g, Rfl: 11;   levonorgestrel (MIRENA) 20 MCG/24HR IUD, 1 each by Intrauterine  route once., Disp: , Rfl: ;   losartan-hydrochlorothiazide (HYZAAR) 100-25 MG per tablet, Take 1 tablet by mouth daily., Disp: , Rfl:  omeprazole (PRILOSEC) 20 MG capsule, Take 20 mg by mouth daily., Disp: , Rfl: ;   promethazine (PHENERGAN) 12.5 MG tablet, Take 12.5 mg by mouth every 6 (six) hours as needed for nausea or vomiting., Disp: , Rfl: ;  [DISCONTINUED]  gabapentin (NEURONTIN) 300 MG capsule, Take 300 mg by mouth. 1 am, 3 pm , Disp: , Rfl: ;  [DISCONTINUED]  levonorgestrel-ethinyl estradiol (ALTAVERA) 0.15-30 MG-MCG per tablet, Take 1 tablet by mouth daily.  , Disp: , Rfl:   ROS: History obtained from the patient  General ROS: negative for -  chills, fatigue, fever, night sweats, weight gain or weight loss Psychological ROS: negative for - behavioral disorder, hallucinations, memory difficulties, mood swings or suicidal ideation Ophthalmic ROS: as noted in HPI ENT ROS: negative for - epistaxis, nasal discharge, oral lesions, sore throat, tinnitus or vertigo Allergy and Immunology ROS: negative for - hives or itchy/watery eyes Hematological and Lymphatic ROS: negative for - bleeding problems, bruising or swollen lymph nodes Endocrine ROS: negative for - galactorrhea, hair pattern changes, polydipsia/polyuria or temperature intolerance Respiratory ROS: negative for - cough, hemoptysis, shortness of breath or wheezing Cardiovascular ROS: negative for - chest pain, dyspnea on exertion, edema or irregular heartbeat Gastrointestinal ROS: as noted in HPI Genito-Urinary ROS: negative for - dysuria, hematuria, incontinence or urinary frequency/urgency Musculoskeletal ROS: negative for - joint swelling or muscular weakness Neurological ROS: as noted in HPI Dermatological ROS: negative for rash and skin lesion changes  Physical Examination: Blood pressure 124/85, pulse 66, temperature 97.7 F (36.5 C), temperature source Oral, resp. rate 14, height 5\' 5"  (1.651 m), weight 60.782 kg (134 lb), last menstrual period 11/30/2012, SpO2 100.00%.  Neurologic Examination Mental Status: Alert, oriented, thought content appropriate.  Speech fluent without evidence of aphasia.  Able to follow 3 step commands without difficulty. Cranial Nerves: II: Discs flat bilaterally; LHH, pupils equal (6mm), round, reactive to light and accommodation III,IV, VI: left ptosis, extra-ocular motions intact bilaterally V,VII: smile symmetric, facial light touch sensation decreased on the left side of the face VIII: hearing normal bilaterally IX,X: gag reflex present XI: bilateral shoulder shrug XII: midline tongue extension Motor: Right : Upper extremity    5/5    Left:     Upper extremity   4+/5 drift noted without pronation  Lower extremity   5/5     Lower extremity   4/5 Tone and bulk:normal tone throughout; no atrophy noted Sensory: Pinprick and light touch decreased on the left  Deep Tendon Reflexes: 2+ and symmetric throughout Plantars: Right: downgoing   Left: downgoing Cerebellar: normal finger-to-nose and normal heel-to-shin test Gait: unable to test CV: pulses palpable throughout    Laboratory Studies:   Basic Metabolic Panel:  Recent Labs Lab 12/29/12 1409 01/01/13 2157  NA 135 139  K 4.4 3.5  CL 102 99  CO2 24  --   GLUCOSE 79 92  BUN 8 7  CREATININE 0.8 1.00  CALCIUM 9.1  --     Liver Function Tests:  Recent Labs Lab 12/29/12 1409  AST 19  ALT 9   No results found for this basename: LIPASE, AMYLASE,  in the last 168 hours No results found for this basename: AMMONIA,  in the last 168 hours  CBC:  Recent Labs Lab 01/01/13 2129 01/01/13 2157  WBC 7.7  --   NEUTROABS 4.4  --  HGB 14.6 15.6*  HCT 41.5 46.0  MCV 85.7  --   PLT 331  --     Cardiac Enzymes: No results found for this basename: CKTOTAL, CKMB, CKMBINDEX, TROPONINI,  in the last 168 hours  BNP: No components found with this basename: POCBNP,   CBG: No results found for this basename: GLUCAP,  in the last 168 hours  Microbiology: Results for orders placed in visit on 03/24/12  URINE CULTURE     Status: None   Collection Time    03/24/12  2:46 PM      Result Value Range Status   Colony Count 3,000 COLONIES/ML   Final   Organism ID, Bacteria Insignificant Growth   Final    Coagulation Studies:  Recent Labs  01/01/13 2129  LABPROT 13.5  INR 1.05    Urinalysis: No results found for this basename: COLORURINE, APPERANCEUR, LABSPEC, PHURINE, GLUCOSEU, HGBUR, BILIRUBINUR, KETONESUR, PROTEINUR, UROBILINOGEN, NITRITE, LEUKOCYTESUR,  in the last 168 hours  Lipid Panel:  No results found for this basename: chol, trig, hdl,  cholhdl, vldl, ldlcalc    HgbA1C:  No results found for this basename: HGBA1C    Urine Drug Screen:   No results found for this basename: labopia, cocainscrnur, labbenz, amphetmu, thcu, labbarb    Alcohol Level: No results found for this basename: ETH,  in the last 168 hours   Imaging: Mr Brain Wo Contrast  01/01/2013   CLINICAL DATA:  Migraine, left-sided facial numbness  EXAM: MRI HEAD WITHOUT CONTRAST  TECHNIQUE: Multiplanar, multiecho pulse sequences of the brain and surrounding structures were obtained without intravenous contrast.  COMPARISON:  Prior CT from 12/17/2012  FINDINGS: No focal parenchymal signal abnormality is identified within the brain. There is no mass or midline shift. CSF containing spaces are within normal limits. Ventricles are normal in size without evidence of hydrocephalus. There is no extra-axial fluid collection.  No diffusion-weighted signal abnormality is identified to suggest acute intracranial infarct. No intracranial hemorrhage seen on the gradient echo sequence. Normal intravascular flow voids are seen throughout the intracranial vasculature.  The craniocervical junction is within normal limits. Pituitary gland is normal. The globes, optic nerves, and optic chiasm are within normal limits. Corpus callosum is normal.  The calvarium demonstrates a normal appearance with normal signal intensity. Scalp soft tissues are normal.  The paranasal sinuses are clear.  No mastoid effusion.  IMPRESSION: Normal MRI of the brain.   Electronically Signed   By: Rise Mu M.D.   On: 01/01/2013 22:50     Assessment/Plan: 32 year old female presenting with a change in her daily migraines that has now been present for about a week.  Examination significant for a left hemiparesis.  MRI of the brain reviewed and shows no abnormalities making complicated migraine the likely diagnosis.  Patient reports that she has been managing her symptoms at home for years by keeping a  routine.  Unclear why her migraine character has changed but there is no evidence of stroke, hemorrhage or tumor as the etiology.    Recommendations: 1.  Depacon 500mg  IV now 2.  Expectations discussed with patient and husband.  She reports that she is able to ambulate and care for herself at home and is comfortable returning to home now that she is aware that she is not having a stroke.   3.  Patient to follow up with her neurologist as an outpatient.    Case discussed with Dr. Achille Rich, MD [facility] [phone]  01/02/2013, 12:07 AM

## 2013-01-02 NOTE — ED Notes (Signed)
Pharmacy called to ask for Depacon to be sent to pod B.

## 2013-01-02 NOTE — ED Notes (Signed)
NP at bedside.

## 2013-01-03 NOTE — ED Provider Notes (Signed)
Medical screening examination/treatment/procedure(s) were performed by non-physician practitioner and as supervising physician I was immediately available for consultation/collaboration.  EKG Interpretation   None         Joya Gaskins, MD 01/03/13 [phone]

## 2013-01-04 DIAGNOSIS — G43801 Other migraine, not intractable, with status migrainosus: Secondary | ICD-10-CM | POA: Diagnosis not present

## 2013-01-04 DIAGNOSIS — G43101 Migraine with aura, not intractable, with status migrainosus: Secondary | ICD-10-CM | POA: Diagnosis not present

## 2013-01-05 NOTE — Telephone Encounter (Signed)
Spoke with the pt's husband and he stated that the pt will not need the referral requested.//AB/CMA

## 2013-01-06 ENCOUNTER — Telehealth: Payer: Self-pay | Admitting: *Deleted

## 2013-01-06 NOTE — Telephone Encounter (Signed)
Message copied by Verdie Shire on Wed Jan 06, 2013  5:20 PM ------      Message from: Pecola Lawless      Created: Sat Jan 02, 2013 11:03 AM       Please FAX ER visit & MRI results to Dr Leanna Battles, [facility].Appt 11/24. Thanks, Hopp       ------

## 2013-01-06 NOTE — Telephone Encounter (Signed)
Faxed office notes to Dr. Malone.//AB/CMA

## 2013-01-08 ENCOUNTER — Other Ambulatory Visit: Payer: Self-pay | Admitting: Oncology

## 2013-01-08 DIAGNOSIS — D6800 Von Willebrand disease, unspecified: Secondary | ICD-10-CM

## 2013-01-11 ENCOUNTER — Telehealth: Payer: Self-pay | Admitting: Oncology

## 2013-01-11 NOTE — Telephone Encounter (Signed)
lvom and gve lab appt 1/05 @ 11:30 instructed pt to return call to confirm message was received.

## 2013-01-15 ENCOUNTER — Other Ambulatory Visit: Payer: 59

## 2013-01-21 ENCOUNTER — Other Ambulatory Visit (HOSPITAL_BASED_OUTPATIENT_CLINIC_OR_DEPARTMENT_OTHER): Payer: 59

## 2013-01-21 DIAGNOSIS — D6800 Von Willebrand disease, unspecified: Secondary | ICD-10-CM | POA: Diagnosis not present

## 2013-01-21 LAB — CBC WITH DIFFERENTIAL/PLATELET
BASO%: 0.3 % (ref 0.0–2.0)
Basophils Absolute: 0 10*3/uL (ref 0.0–0.1)
EOS%: 0.8 % (ref 0.0–7.0)
Eosinophils Absolute: 0.1 10*3/uL (ref 0.0–0.5)
HCT: 43 % (ref 34.8–46.6)
HGB: 13.9 g/dL (ref 11.6–15.9)
LYMPH%: 20.9 % (ref 14.0–49.7)
MCH: 28.5 pg (ref 25.1–34.0)
MCHC: 32.4 g/dL (ref 31.5–36.0)
MCV: 87.8 fL (ref 79.5–101.0)
MONO#: 0.7 10*3/uL (ref 0.1–0.9)
MONO%: 8.8 % (ref 0.0–14.0)
NEUT#: 5.9 10*3/uL (ref 1.5–6.5)
NEUT%: 69.2 % (ref 38.4–76.8)
Platelets: 276 10*3/uL (ref 145–400)
RBC: 4.89 10*6/uL (ref 3.70–5.45)
RDW: 19.4 % — ABNORMAL HIGH (ref 11.2–14.5)
WBC: 8.5 10*3/uL (ref 3.9–10.3)
lymph#: 1.8 10*3/uL (ref 0.9–3.3)

## 2013-01-21 LAB — MORPHOLOGY: PLT EST: ADEQUATE

## 2013-01-21 LAB — CHCC SMEAR

## 2013-01-21 LAB — APTT: aPTT: 33.9 seconds (ref 24–37)

## 2013-01-23 ENCOUNTER — Other Ambulatory Visit: Payer: Self-pay | Admitting: Internal Medicine

## 2013-01-24 LAB — VON WILLEBRAND PANEL
Coagulation Factor VIII: 118 % (ref 73–140)
Ristocetin Co-factor, Plasma: 64 % (ref 42–200)
Von Willebrand Antigen, Plasma: 93 % (ref 50–217)

## 2013-02-12 ENCOUNTER — Telehealth: Payer: Self-pay | Admitting: Oncology

## 2013-02-12 NOTE — Telephone Encounter (Signed)
PATIENT CALLED TO RS/ TO 01/28 @ 3 W/DR. GRANFORTUNA.

## 2013-02-22 DIAGNOSIS — R51 Headache: Secondary | ICD-10-CM | POA: Diagnosis not present

## 2013-02-24 ENCOUNTER — Encounter: Payer: 59 | Admitting: Oncology

## 2013-02-24 ENCOUNTER — Ambulatory Visit: Payer: 59

## 2013-03-01 ENCOUNTER — Encounter: Payer: Self-pay | Admitting: Internal Medicine

## 2013-03-01 ENCOUNTER — Ambulatory Visit (INDEPENDENT_AMBULATORY_CARE_PROVIDER_SITE_OTHER): Payer: 59 | Admitting: Internal Medicine

## 2013-03-01 VITALS — BP 118/73 | HR 93 | Temp 97.6°F | Ht 65.0 in | Wt 136.2 lb

## 2013-03-01 DIAGNOSIS — G971 Other reaction to spinal and lumbar puncture: Secondary | ICD-10-CM | POA: Diagnosis not present

## 2013-03-01 DIAGNOSIS — Z23 Encounter for immunization: Secondary | ICD-10-CM | POA: Diagnosis not present

## 2013-03-01 DIAGNOSIS — H6120 Impacted cerumen, unspecified ear: Secondary | ICD-10-CM

## 2013-03-01 DIAGNOSIS — R112 Nausea with vomiting, unspecified: Secondary | ICD-10-CM

## 2013-03-01 MED ORDER — ONDANSETRON HCL 4 MG PO TABS: 4.0000 mg | ORAL_TABLET | Freq: Three times a day (TID) | ORAL | Status: DC | PRN

## 2013-03-01 NOTE — Patient Instructions (Signed)
Please do not use Q-tips as we discussed. Should wax build up occur, please put 2-3 drops of mineral oil in the affected  ear at night to soften the wax .Cover the canal with a  cotton ball to prevent the oil from staining bed linens. In the morning fill the ear canal with hydrogen peroxide & lie in the opposite lateral decubitus position(on the side opposite the affected ear)  for 10-15 minutes. After allowing this period of time for the peroxide to dissolve the wax ;shower and use the thinnest washrag available to wick out the wax. If both ears are involved ; alternate this treatment from ear to ear each night until no wax is found on the washrag. Stay on clear liquids for 48-72 hours or until bowels are normal.This would include  jello, sherbert (NOT ice cream), Lipton's chicken noodle soup(NOT cream based soups),Gatorade Lite, flat Ginger ale (without High Fructose Corn Syrup),dry toast or crackers, baked potato.No milk , dairy or grease until bowels are formed.

## 2013-03-01 NOTE — Progress Notes (Signed)
   Subjective:    Patient ID: Lorraine Weber, female    DOB: [birth year], 33 y.o.   MRN: [id number]  HPI   Lumbar puncture was performed 02/22/13; this revealed low-pressure suggesting CSF leaks. This will treated with blood patching procedure tentatively planned in February .  She had headaches after the lumbar puncture but these resolved as of 1/15 when she began having nausea and vomiting. This was repetitive through 1/17. The last 2 days she is vomiting only when she eats  With the onset of the nausea and vomiting she has had nasal congestion.    Review of Systems    The headache has typically been more the left frontal area than in the right.  She's had no nasal purulence, dental pain, or otic discharge. She's had some ringing in the left ear.  She's had some sore throat attributes this to throwing up.  She's also had some hip and low back pain since she's been vomiting.  She's also some chills and sweats. She has not documented any fever     Objective:   Physical Exam  General appearance:adquately nourished; appears fatigued but in no acute distress .No increased work of breathing is present.  Shotty L cervical  lymphadenopathy .None in axilla noted.   Eyes: No conjunctival inflammation or lid edema is present. There is no scleral icterus. Ptosis OS  Ears:  External ear exam shows no significant lesions or deformities.  Otoscopic examination reveals wax occluding L canal. R TM normal  Nose:  External nasal examination shows no deformity or inflammation. Nasal mucosa are dry without lesions or exudates. No septal dislocation or deviation.No obstruction to airflow.   Oral exam: Dental hygiene is good; lips and gums are healthy appearing.There is oropharyngeal erythema ,no exudate noted. Tongue moist  Neck:  No deformities,  masses, or tenderness noted.   Supple with full range of motion without pain.   Heart:  Normal rate and regular rhythm. S1 and S2 normal without gallop,  murmur, click, rub or other extra sounds. S4  Lungs:Chest clear to auscultation; no wheezes, rhonchi,rales ,or rubs present.No increased work of breathing.    Extremities:  No cyanosis, edema, or clubbing  noted . No tenting  Abdomen: bowel sounds normal, soft but slightly tender without masses, organomegaly or hernias noted.  No guarding or rebound   Skin: Warm & dry w/o jaundice or tenting.         Assessment & Plan:  #1 post lumbar puncture headache which has improved  #2 nausea and vomiting; this has improved and now is only with food intake  #3 cerumen impaction left with subsequent tinnitus  Plan: see orders

## 2013-03-01 NOTE — Progress Notes (Signed)
Pre visit review using our clinic review tool, if applicable. No additional management support is needed unless otherwise documented below in the visit note. 

## 2013-03-10 ENCOUNTER — Encounter: Payer: Self-pay | Admitting: Oncology

## 2013-03-10 ENCOUNTER — Ambulatory Visit (HOSPITAL_BASED_OUTPATIENT_CLINIC_OR_DEPARTMENT_OTHER): Payer: 59 | Admitting: Oncology

## 2013-03-10 ENCOUNTER — Telehealth: Payer: Self-pay | Admitting: Oncology

## 2013-03-10 ENCOUNTER — Ambulatory Visit: Payer: 59

## 2013-03-10 VITALS — BP 119/68 | HR 90 | Temp 98.7°F | Resp 18 | Ht 65.0 in | Wt 134.6 lb

## 2013-03-10 DIAGNOSIS — D6800 Von Willebrand disease, unspecified: Secondary | ICD-10-CM

## 2013-03-10 DIAGNOSIS — G43909 Migraine, unspecified, not intractable, without status migrainosus: Secondary | ICD-10-CM
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
## 2013-03-10 NOTE — Telephone Encounter (Signed)
Gave pt appt for lab and MD for February 2015 °

## 2013-03-10 NOTE — Progress Notes (Signed)
Checked in new patient with no financial issues. She has appt card. °

## 2013-03-15 ENCOUNTER — Other Ambulatory Visit (HOSPITAL_BASED_OUTPATIENT_CLINIC_OR_DEPARTMENT_OTHER): Payer: 59

## 2013-03-15 DIAGNOSIS — D6800 Von Willebrand disease, unspecified: Secondary | ICD-10-CM | POA: Diagnosis not present

## 2013-03-15 LAB — CBC WITH DIFFERENTIAL/PLATELET
BASO%: 0.3 % (ref 0.0–2.0)
Basophils Absolute: 0 10*3/uL (ref 0.0–0.1)
EOS%: 1 % (ref 0.0–7.0)
Eosinophils Absolute: 0.1 10*3/uL (ref 0.0–0.5)
HCT: 41.2 % (ref 34.8–46.6)
HGB: 13.7 g/dL (ref 11.6–15.9)
LYMPH%: 23.7 % (ref 14.0–49.7)
MCH: 29.4 pg (ref 25.1–34.0)
MCHC: 33.4 g/dL (ref 31.5–36.0)
MCV: 88.2 fL (ref 79.5–101.0)
MONO#: 0.7 10*3/uL (ref 0.1–0.9)
MONO%: 7 % (ref 0.0–14.0)
NEUT#: 7 10*3/uL — ABNORMAL HIGH (ref 1.5–6.5)
NEUT%: 68 % (ref 38.4–76.8)
Platelets: 290 10*3/uL (ref 145–400)
RBC: 4.67 10*6/uL (ref 3.70–5.45)
RDW: 18.7 % — ABNORMAL HIGH (ref 11.2–14.5)
WBC: 10.3 10*3/uL (ref 3.9–10.3)
lymph#: 2.4 10*3/uL (ref 0.9–3.3)

## 2013-03-15 LAB — COMPREHENSIVE METABOLIC PANEL (CC13)
ALT: 13 U/L (ref 0–55)
AST: 17 U/L (ref 5–34)
Albumin: 4 g/dL (ref 3.5–5.0)
Alkaline Phosphatase: 112 U/L (ref 40–150)
Anion Gap: 11 mEq/L (ref 3–11)
BUN: 6.8 mg/dL — ABNORMAL LOW (ref 7.0–26.0)
CO2: 26 mEq/L (ref 22–29)
Calcium: 9.3 mg/dL (ref 8.4–10.4)
Chloride: 101 mEq/L (ref 98–109)
Creatinine: 0.7 mg/dL (ref 0.6–1.1)
Glucose: 97 mg/dl (ref 70–140)
Potassium: 3.3 mEq/L — ABNORMAL LOW (ref 3.5–5.1)
Sodium: 138 mEq/L (ref 136–145)
Total Bilirubin: 0.27 mg/dL (ref 0.20–1.20)
Total Protein: 7.1 g/dL (ref 6.4–8.3)

## 2013-03-15 LAB — APTT: aPTT: 32.6 seconds (ref 24–37)

## 2013-03-15 LAB — MORPHOLOGY: PLT EST: ADEQUATE

## 2013-03-15 LAB — CHCC SMEAR

## 2013-03-19 LAB — HEPATITIS PANEL, ACUTE
HCV Ab: NEGATIVE
Hep A IgM: NONREACTIVE
Hep B C IgM: NONREACTIVE
Hepatitis B Surface Ag: NEGATIVE

## 2013-03-19 LAB — VON WILLEBRAND FACTOR MULTIMER
Factor-VIII Activity: 77 % (ref 50–180)
Ristocetin Co-Factor: 98 % (ref 42–200)
Von Willebrand Factor Ag: 94 % (ref 50–217)

## 2013-03-19 LAB — HIV ANTIBODY (ROUTINE TESTING W REFLEX): HIV: NONREACTIVE

## 2013-03-22 ENCOUNTER — Encounter: Payer: Self-pay | Admitting: Internal Medicine

## 2013-03-22 ENCOUNTER — Ambulatory Visit (INDEPENDENT_AMBULATORY_CARE_PROVIDER_SITE_OTHER): Payer: 59 | Admitting: Internal Medicine

## 2013-03-22 VITALS — BP 100/50 | HR 80 | Temp 98.0°F | Wt 135.8 lb

## 2013-03-22 DIAGNOSIS — R109 Unspecified abdominal pain: Secondary | ICD-10-CM

## 2013-03-22 DIAGNOSIS — J019 Acute sinusitis, unspecified: Secondary | ICD-10-CM

## 2013-03-22 MED ORDER — TRAMADOL HCL 50 MG PO TABS: 50.0000 mg | ORAL_TABLET | Freq: Four times a day (QID) | ORAL | Status: DC | PRN

## 2013-03-22 MED ORDER — CEFUROXIME AXETIL 250 MG PO TABS: 250.0000 mg | ORAL_TABLET | Freq: Two times a day (BID) | ORAL | Status: DC

## 2013-03-22 NOTE — Progress Notes (Signed)
Subjective:    Patient ID: Lorraine Weber, female    DOB: 11-04-1980, 33 y.o.   MRN: [id number]  HPI   She continues to have headaches for which she is being evaluated at [facility]. The final plan is being developed in reference to the spinal fluid leak. Dr. Beryle Beams or tenderness hematology notes had been previously reviewed  She's been taking Tylenol for the headache  As of 2/4 she's developed frontal sinus as well as maxillary sinus pain associated with yellow green nasal discharge. She also has dental pain and otic pain without associated discharge  She's had some cough and wheezing. The cough is nonproductive.  She has had low grade fever.  The fever has been associated with some chills      Review of Systems  As of 2/2 she has had right posterior flank pain which can last hours-all day. This does radiate to the right thigh. This is reminiscent of the pain she's had with kidney stones in the past.  She has had some blood in the urine. She denies associated pyuria or hematuria.      Objective:   Physical Exam Gen.: Healthy and well-nourished in appearance. Alert, appropriate and cooperative throughout exam.  Head: Normocephalic without obvious abnormalities Eyes: No corneal or conjunctival inflammation noted.  Ears: External  ear exam reveals no significant lesions or deformities. Canals clear .TMs normal. Hearing is grossly normal bilaterally. Nose: External nasal exam reveals no deformity or inflammation. Nasal mucosa are pink and moist. No lesions or exudates noted.  Mouth: Oral mucosa and oropharynx reveal no lesions or exudates. Teeth in good repair. Neck: No deformities, masses, or tenderness noted. Range of motion normal. Lungs: Normal respiratory effort; chest expands symmetrically. Lungs are clear to auscultation without rales, wheezes, or increased work of breathing. Heart: Normal rate and rhythm. Normal S1 and S2. No gallop, click, or rub. No murmur. Abdomen:  Bowel sounds normal; abdomen soft and nontender. No masses, organomegaly or hernias noted. She does have some flank tenderness to light percussion on the right.                                  Musculoskeletal/extremities: No deformity or scoliosis noted of  the thoracic or lumbar spine.  No clubbing, cyanosis, edema, or significant extremity  deformity noted. Range of motion normal .Tone & strength normal. Hand joints normal.  Able to lie down & sit up w/o help. Negative SLR bilaterally; but she does have some discomfort in the right thigh with elevation right lower extremity beyond 45. Vascular: Carotid, radial artery, dorsalis pedis and  posterior tibial pulses are full and equal. No bruits present. Neurologic: Alert and oriented x3. Skin: Intact without suspicious lesions or rashes. Lymph: No cervical, axillary lymphadenopathy present. Psych: Mood and affect are normal. Normally interactive                                                                                        Assessment & Plan:  #1 acute maxillary and frontal sinusitis  #2 flank pain with right thigh radiation.  By history this is reminiscent of symptoms with renal calculi. Less likely would be lumbar radiculopathy  Plan :see orders

## 2013-03-22 NOTE — Progress Notes (Signed)
Pre visit review using our clinic review tool, if applicable. No additional management support is needed unless otherwise documented below in the visit note. 

## 2013-03-22 NOTE — Patient Instructions (Signed)
Stay well hydrated. Drink to thirst up to 40 ounces of fluids daily.

## 2013-03-29 ENCOUNTER — Ambulatory Visit (HOSPITAL_BASED_OUTPATIENT_CLINIC_OR_DEPARTMENT_OTHER): Payer: Medicare Other | Admitting: Oncology

## 2013-03-29 VITALS — BP 111/72 | HR 67 | Temp 97.5°F | Resp 18 | Ht 65.0 in | Wt 137.2 lb

## 2013-03-29 DIAGNOSIS — D6800 Von Willebrand disease, unspecified: Secondary | ICD-10-CM

## 2013-03-29 NOTE — Progress Notes (Signed)
Short interval followup visit for this pleasant 33 year old woman referred for further evaluation of von Willebrand's disorder. Please see my initial office evaluation dated 03/10/2013 for full details of her medical history. Briefly, she has had lifelong problems with bleeding, in particular menorrhagia. At time of her visit here in January, she had recently come off oral hormonal therapy and had an IUD placed. She had increased breakthrough bleeding but now her menses have normalized.  Medical records were destroyed when she left [place]. I attempted to establish a new baseline for her here in [place]. To my surprise, a von Willebrand's profiles, now repeated twice at a 2 month intervals,  are perfectly normal. If she does have von Willebrand's disorder, it is very mild and it is type I given normal multimer analysis. She tests negative for hepatitis A, B., C., and HIV. She has a normal baseline pro time of 13.5 seconds and a normal PTT of 33.9 seconds. A bleeding time on record from November 2009 was normal at 4 minutes. This makes it unlikely that she has a qualitative platelet disorder.  I told her that mild von Willebrand's disorder typically shows fluctuating levels of the clotting factors which can be in the normal range at times.  She has neurologic issues related to head trauma resulting in a seizure disorder and chronic migraine headaches. She saw a migraine specialist at [facility] and is scheduled to have a blood patch procedure in attempt to control her headaches. Given the normal von Willebrand's profile x2, I feel it is safe for her to proceed with these procedures at this time. There is no reason to do provocative testing with DDAVP given normal baseline factor VIII activity, ristocetin activity, and von Willebrand factor antigen.  I gave her copies of all her labs to keep for her file and to present to other physicians that she sees in consultation. I gave her my  phone number at [facility] internal medicine Department and she will contact me if I can be of assistance to her in the future.

## 2013-03-30 ENCOUNTER — Telehealth: Payer: Self-pay | Admitting: Oncology

## 2013-03-30 NOTE — Telephone Encounter (Signed)
per 2/16 pof f/u prn and see JG @ [facility]

## 2013-04-01 DIAGNOSIS — R51 Headache: Secondary | ICD-10-CM | POA: Diagnosis not present

## 2013-04-01 DIAGNOSIS — G9611 Dural tear: Secondary | ICD-10-CM | POA: Diagnosis not present

## 2013-04-23 ENCOUNTER — Ambulatory Visit (INDEPENDENT_AMBULATORY_CARE_PROVIDER_SITE_OTHER): Payer: 59 | Admitting: Internal Medicine

## 2013-04-23 ENCOUNTER — Other Ambulatory Visit (INDEPENDENT_AMBULATORY_CARE_PROVIDER_SITE_OTHER): Payer: 59

## 2013-04-23 ENCOUNTER — Encounter: Payer: Self-pay | Admitting: Internal Medicine

## 2013-04-23 VITALS — BP 120/80 | HR 99 | Temp 97.7°F | Resp 12 | Wt 131.4 lb

## 2013-04-23 DIAGNOSIS — R112 Nausea with vomiting, unspecified: Secondary | ICD-10-CM

## 2013-04-23 DIAGNOSIS — G971 Other reaction to spinal and lumbar puncture: Secondary | ICD-10-CM

## 2013-04-23 DIAGNOSIS — E876 Hypokalemia: Secondary | ICD-10-CM

## 2013-04-23 LAB — HEPATIC FUNCTION PANEL
ALT: 15 U/L (ref 0–35)
AST: 21 U/L (ref 0–37)
Albumin: 4.2 g/dL (ref 3.5–5.2)
Alkaline Phosphatase: 100 U/L (ref 39–117)
Bilirubin, Direct: 0.1 mg/dL (ref 0.0–0.3)
Total Bilirubin: 0.6 mg/dL (ref 0.3–1.2)
Total Protein: 7.7 g/dL (ref 6.0–8.3)

## 2013-04-23 LAB — BASIC METABOLIC PANEL
BUN: 14 mg/dL (ref 6–23)
CO2: 24 mEq/L (ref 19–32)
Calcium: 9.1 mg/dL (ref 8.4–10.5)
Chloride: 103 mEq/L (ref 96–112)
Creatinine, Ser: 0.8 mg/dL (ref 0.4–1.2)
GFR: 85.58 mL/min (ref >60.00)
Glucose, Bld: 85 mg/dL (ref 70–99)
Potassium: 2.9 mEq/L — ABNORMAL LOW (ref 3.5–5.1)
Sodium: 136 mEq/L (ref 135–145)

## 2013-04-23 MED ORDER — POTASSIUM CHLORIDE CRYS ER 20 MEQ PO TBCR: EXTENDED_RELEASE_TABLET | ORAL | Status: DC

## 2013-04-23 MED ORDER — GABAPENTIN 100 MG PO CAPS: ORAL_CAPSULE | ORAL | Status: DC

## 2013-04-23 MED ORDER — LOSARTAN POTASSIUM-HCTZ 100-25 MG PO TABS: 1.0000 | ORAL_TABLET | Freq: Every day | ORAL | Status: DC

## 2013-04-23 MED ORDER — METOCLOPRAMIDE HCL 5 MG/5ML PO SOLN: 5.0000 mg | Freq: Three times a day (TID) | ORAL | Status: DC | PRN

## 2013-04-23 NOTE — Patient Instructions (Addendum)
Stay on clear liquids for 48-72 hours or until bowels are normal.This would include  jello, sherbert (NOT ice cream), Lipton's chicken noodle soup(NOT cream based soups),Gatorade Lite, flat Ginger ale (without High Fructose Corn Syrup),dry toast or crackers, baked potato.No milk , dairy or grease until bowels are formed.  Share results with all non [place] medical staff seen   Potassium 2.9; Losartan HCT decreased to one half pill daily. Potassium chloride 20 mEq twice a day for 4 days then 1 daily. Repeat the BMET in 5 days

## 2013-04-23 NOTE — Addendum Note (Signed)
Addended byHendricks Limes on: 04/23/2013 06:20 PM   Modules accepted: Orders

## 2013-04-23 NOTE — Progress Notes (Signed)
Pre visit review using our clinic review tool, if applicable. No additional management support is needed unless otherwise documented below in the visit note. 

## 2013-04-23 NOTE — Progress Notes (Signed)
   Subjective:    Patient ID: Lorraine Weber, female    DOB: November 13, 1980, 33 y.o.   MRN: [id number]  HPI  She's had nausea since her myelogram 6 weeks ago; it has been progressive over the last week. Now aromas or attempting to eat cause of nausea.  She has been having a different  headaches and describes associated  disorientation as being present at times. These are different than her prior migraine headaches. Diamox Rxed for new headaches  The patching procedure for the CSF leak has been completed (3 leaks)  Her headaches now present as "a drill coming through my brain" over the crown of the head and the right side of the face and neck. Unlike migraines these are not associated with an aura.  At this time  nausea is persistent; she has vomiting one to 2 times per day.Phenergan helps more than Zofran    Review of Systems  She has had some mild epistaxis. She has no cough or sputum production. She does not have itchy, watery eyes.  She has  not had cola colored urine or [place] colored stools.        Objective:   Physical Exam Appears thin but adequately nourished & in no acute distress  Very dry lips ; tongue is moist  No scleral icterus or conjunctivitis  No carotid bruits are present.No neck pain distention present at 10 - 15 degrees. Thyroid normal to palpation  Heart rhythm and rate are normal with S4 gallop ; no murmur  Chest is clear with no increased work of breathing  There is no evidence of aortic aneurysm or renal artery bruits  Abdomen soft with no organomegaly or masses. No HJR  No clubbing or cyanosis .Trace edema present.  Pedal pulses are intact   No jaundice present . No tenting; good tissue turgor Very flat affect ; unable to give date as 13th. Strength, tone decreased symmetrically. DTRs reflexes normal          Assessment & Plan:  #1 nausea & vomiting #2 post spinal tap headache  Plan: See orders

## 2013-04-26 ENCOUNTER — Telehealth: Payer: Self-pay | Admitting: Internal Medicine

## 2013-04-26 ENCOUNTER — Telehealth: Payer: Self-pay | Admitting: *Deleted

## 2013-04-26 NOTE — Telephone Encounter (Signed)
Pt called wanting to let Dr Linna Darner know she is having a Lumbar Puncture tomorrow by Dr Pearline Cables.

## 2013-04-26 NOTE — Telephone Encounter (Signed)
Relevant patient education assigned to patient using Emmi. ° °

## 2013-04-27 DIAGNOSIS — R51 Headache: Secondary | ICD-10-CM | POA: Diagnosis not present

## 2013-04-27 DIAGNOSIS — M542 Cervicalgia: Secondary | ICD-10-CM | POA: Diagnosis not present

## 2013-04-28 ENCOUNTER — Other Ambulatory Visit (INDEPENDENT_AMBULATORY_CARE_PROVIDER_SITE_OTHER): Payer: 59

## 2013-04-28 DIAGNOSIS — E876 Hypokalemia: Secondary | ICD-10-CM

## 2013-04-28 LAB — BASIC METABOLIC PANEL
BUN: 8 mg/dL (ref 6–23)
CO2: 26 mEq/L (ref 19–32)
Calcium: 8.9 mg/dL (ref 8.4–10.5)
Chloride: 106 mEq/L (ref 96–112)
Creatinine, Ser: 0.7 mg/dL (ref 0.4–1.2)
GFR: 102.97 mL/min (ref >60.00)
Glucose, Bld: 106 mg/dL — ABNORMAL HIGH (ref 70–99)
Potassium: 3.7 mEq/L (ref 3.5–5.1)
Sodium: 138 mEq/L (ref 135–145)

## 2013-05-11 DIAGNOSIS — R51 Headache: Secondary | ICD-10-CM | POA: Diagnosis not present

## 2013-05-11 DIAGNOSIS — H53149 Visual discomfort, unspecified: Secondary | ICD-10-CM | POA: Diagnosis not present

## 2013-05-13 ENCOUNTER — Encounter: Payer: Self-pay | Admitting: Internal Medicine

## 2013-05-13 ENCOUNTER — Other Ambulatory Visit: Payer: Self-pay

## 2013-05-13 ENCOUNTER — Other Ambulatory Visit: Payer: Self-pay | Admitting: Internal Medicine

## 2013-05-13 DIAGNOSIS — R112 Nausea with vomiting, unspecified: Secondary | ICD-10-CM

## 2013-05-13 MED ORDER — METOCLOPRAMIDE HCL 5 MG/5ML PO SOLN: 5.0000 mg | Freq: Three times a day (TID) | ORAL | Status: DC | PRN

## 2013-05-17 ENCOUNTER — Encounter: Payer: Self-pay | Admitting: Internal Medicine

## 2013-05-17 ENCOUNTER — Ambulatory Visit (INDEPENDENT_AMBULATORY_CARE_PROVIDER_SITE_OTHER): Payer: 59 | Admitting: Internal Medicine

## 2013-05-17 VITALS — BP 130/90 | HR 84 | Temp 98.0°F | Resp 13 | Wt 134.4 lb

## 2013-05-17 DIAGNOSIS — R590 Localized enlarged lymph nodes: Secondary | ICD-10-CM

## 2013-05-17 DIAGNOSIS — R599 Enlarged lymph nodes, unspecified: Secondary | ICD-10-CM | POA: Diagnosis not present

## 2013-05-17 DIAGNOSIS — R51 Headache: Secondary | ICD-10-CM | POA: Diagnosis not present

## 2013-05-17 NOTE — Progress Notes (Signed)
   Subjective:    Patient ID: Lorraine Weber, female    DOB: 22-Aug-1980, 33 y.o.   MRN: [id number]  HPI   Symptoms began 05/14/13. She noticed a node behind the L ear  associated with pain in her neck. She also had low-grade fever.  She is having discomfort above and below the left eye and in the  left temple. The pain radiates into the left neck more so than the right.  In the last 48 hours she's had increasing urinary frequency.  Significantly the blood patches for CSF leak failed; she had fibrin glue patches placed 05/11/13.    Review of Systems  She denies dysuria,pyuria or hematuria  Her potassium was low while on Diamox. This is now used as needed only for increased pressure headaches.      Objective:   Physical Exam General appearance:Thin but well nourished; no acute distress or increased work of breathing is present.  One small lymph node 1 cm in size L occipital area; none in axilla noted.   Eyes: No conjunctival inflammation or lid edema is present. There is no scleral icterus.Ptosis OS  Ears:  External ear exam shows no significant lesions or deformities. Wax on L. Slightly tender L mastoid w/o erythema or increased temp to touch  Nose:  External nasal examination shows no deformity or inflammation. Nasal mucosa are dry without lesions or exudates. No septal dislocation or deviation.No obstruction to airflow.   Oral exam: Dental hygiene is good; lips and gums are healthy appearing.There is no oropharyngeal erythema or exudate noted.   Neck:  No deformities, thyromegaly, masses, or tenderness noted.   Not allowed to perform  range of motion maneuver.   Heart:  Normal rate and regular rhythm. S1 and S2 normal without gallop, murmur, click, rub or other extra sounds.   Lungs:Chest clear to auscultation; no wheezes, rhonchi,rales ,or rubs present.No increased work of breathing.    Extremities:  No cyanosis, edema, or clubbing  noted    Skin: Warm & dry w/o jaundice or  tenting.         Assessment & Plan:  #1 headache & neck pain ,most likely from csf issues #2 wax impaction on L #3 non pathologic post cervical chain node See orders

## 2013-05-17 NOTE — Progress Notes (Signed)
Pre visit review using our clinic review tool, if applicable. No additional management support is needed unless otherwise documented below in the visit note. 

## 2013-05-17 NOTE — Patient Instructions (Addendum)
The best protocol to address wax build up is : Do not use Q-tips as this simply packs wax deeper in the ear canal.  Put 2-3 drops of  mineral oil in the affected  ear at night to soften the wax .Cover the canal with a  cotton ball to prevent the oil from staining bed linens. In the morning fill the ear canal with hydrogen peroxide & lie on  the side opposite the affected ear for 10-15 minutes. After allowing this period of time for the peroxide to dissolve the wax ;shower and use the thinnest washrag available to wick out the wax. If both ears are involved ; alternate this treatment from ear to ear each night until no wax is found on the washrag. Assess response to the gabapentin one every 8 hours as needed. If it is partially beneficial, it can be increased up to a total of 3 pills every 8 hours as needed. This increase of 1 pill each dose  should take place over 72 hours at least.Max dose is 3 pills every 8 hrs as needed Notify [facility] of symptoms

## 2013-05-21 DIAGNOSIS — G43009 Migraine without aura, not intractable, without status migrainosus: Secondary | ICD-10-CM | POA: Diagnosis not present

## 2013-05-21 DIAGNOSIS — L678 Other hair color and hair shaft abnormalities: Secondary | ICD-10-CM | POA: Diagnosis not present

## 2013-05-21 DIAGNOSIS — IMO0002 Reserved for concepts with insufficient information to code with codable children: Secondary | ICD-10-CM | POA: Diagnosis not present

## 2013-05-21 DIAGNOSIS — L738 Other specified follicular disorders: Secondary | ICD-10-CM | POA: Diagnosis not present

## 2013-05-21 DIAGNOSIS — I10 Essential (primary) hypertension: Secondary | ICD-10-CM | POA: Diagnosis not present

## 2013-05-25 ENCOUNTER — Other Ambulatory Visit: Payer: Self-pay

## 2013-05-25 DIAGNOSIS — G971 Other reaction to spinal and lumbar puncture: Secondary | ICD-10-CM

## 2013-05-25 MED ORDER — GABAPENTIN 100 MG PO CAPS: ORAL_CAPSULE | ORAL | Status: DC

## 2013-05-31 DIAGNOSIS — R51 Headache: Secondary | ICD-10-CM | POA: Diagnosis not present

## 2013-06-14 ENCOUNTER — Other Ambulatory Visit: Payer: Self-pay | Admitting: Internal Medicine

## 2013-06-16 ENCOUNTER — Other Ambulatory Visit: Payer: Self-pay | Admitting: Internal Medicine

## 2013-06-30 DIAGNOSIS — H9319 Tinnitus, unspecified ear: Secondary | ICD-10-CM | POA: Diagnosis not present

## 2013-06-30 DIAGNOSIS — R51 Headache: Secondary | ICD-10-CM | POA: Diagnosis not present

## 2013-06-30 DIAGNOSIS — R42 Dizziness and giddiness: Secondary | ICD-10-CM | POA: Diagnosis not present

## 2013-07-08 ENCOUNTER — Encounter: Payer: Self-pay | Admitting: Internal Medicine

## 2013-07-08 ENCOUNTER — Ambulatory Visit (INDEPENDENT_AMBULATORY_CARE_PROVIDER_SITE_OTHER): Payer: 59 | Admitting: Internal Medicine

## 2013-07-08 VITALS — BP 118/80 | HR 83 | Temp 98.1°F | Wt 135.8 lb

## 2013-07-08 DIAGNOSIS — M546 Pain in thoracic spine: Secondary | ICD-10-CM

## 2013-07-08 DIAGNOSIS — G971 Other reaction to spinal and lumbar puncture: Secondary | ICD-10-CM

## 2013-07-08 DIAGNOSIS — M79604 Pain in right leg: Secondary | ICD-10-CM

## 2013-07-08 DIAGNOSIS — L02219 Cutaneous abscess of trunk, unspecified: Secondary | ICD-10-CM

## 2013-07-08 DIAGNOSIS — L03319 Cellulitis of trunk, unspecified: Secondary | ICD-10-CM

## 2013-07-08 DIAGNOSIS — M79609 Pain in unspecified limb: Secondary | ICD-10-CM

## 2013-07-08 MED ORDER — MUPIROCIN 2 % EX OINT: TOPICAL_OINTMENT | CUTANEOUS | Status: DC

## 2013-07-08 MED ORDER — TRAMADOL HCL 50 MG PO TABS: 50.0000 mg | ORAL_TABLET | Freq: Four times a day (QID) | ORAL | Status: DC | PRN

## 2013-07-08 NOTE — Progress Notes (Signed)
Subjective:    Patient ID: Lorraine Weber, female    DOB: 03-12-1980, 33 y.o.   MRN: [id number]  HPI  She has pain in the  scapular area bilaterally which began several hours after spinal tap /07/01/13 @ [facility]. The pain is described as burning with intermittent stabbing. The pain is described as constant since the procedure. The pain has actually radiated to the right posterior and lateral thorax and into the right upper extremity. Also she notices some symptoms involving the right hip and right leg.  She describes the sensation of the leg as numbness  The pain is worse with sitting. There are no factors which alleviate the pain. She has a few narcotic pain pills left but has not taken  anything for the pain.  She has had fever, chills, sweats after the spinal tap. This has occurred previously post tap.  In general she feels fatigued but this is somewhat of a chronic phenomenon, but  it has been worse since the spinal tap.  Her headaches have also exacerbated since the spinal tap. Fioricet has helped the headache in the past but not the back pain.    She is no longer seeing a Garment/textile technologist at [facility]; she is to establish with a Neurologist at [facility] She has no associated blurred vision, double vision, or vision loss  The chest pain is not been associated with palpitations or skipping. She also denies shortness of breath.  There's been no associated abdominal pain, melena, rectal bleeding, or change in bowels  She has no dysuria, pyuria, or hematuria  She has had some slight dizziness and imbalance since onset of symptoms.  She's had no change in the color or  temperature of skin @ pain sites & denies swelling of the joints at the site of pain  She has chronic recurrent skin lesions , most recently over the upper back .  She has no incontinence of urine or stool.       Objective:   Physical Exam  She does not appear uncomfortable or in acute distress. She  appears fatigued. Positive findings include some wax in the left ear. There is decreased range of motion of the neck. Ptosis is present on the left. Asymmetric goiter formation is suggested, right lobe greater than left. No nodules palpable. There is 11 x 6 mm eschar with circumferential erythema present over the left posterior back. She has scattered other excoriated lesions without cellulitis over the trunk. She does have trace edema at the ankles. Straight leg raising on the right does cause back pain. Clinically the there is no definite meningismus. There is accentuated curvature of the lumbosacral spine.  Gen.: Thin but adequately nourished in appearance. Alert and cooperative throughout exam. Appears younger than stated age  Head: Normocephalic without obvious abnormalities. Eyes: No corneal or conjunctival inflammation noted.  Ears: External  ear exam reveals no significant lesions or deformities. Canals clear .TMs normal. Hearing is grossly normal bilaterally. Nose: External nasal exam reveals no deformity or inflammation. Nasal mucosa are pink and moist. No lesions or exudates noted.   Mouth: Oral mucosa and oropharynx reveal no lesions or exudates. Teeth in good repair. Neck: No deformities, masses, or tenderness noted.  Lungs: Normal respiratory effort; chest expands symmetrically. Lungs are clear to auscultation without rales, wheezes, or increased work of breathing. Heart: Normal rate and rhythm. Normal S1 and S2. No gallop, click, or rub.Loud S4 w/o murmur. Abdomen: Bowel sounds normal; abdomen soft  and nontender. No masses, organomegaly or hernias noted.                                  Musculoskeletal/extremities:  No clubbing, cyanosis, or significant extremity  deformity noted. Tone & strength normal. Hand joints normal.  Fingernail  health good. Able to lie down & sit up w/o help. Negative SLR bilaterally (see above) Vascular: Carotid, radial artery, dorsalis pedis and   posterior tibial pulses are full and equal. No bruits present. Neurologic: Alert and oriented x3. Deep tendon reflexes symmetrical and normal.  Gait normal        Lymph: No cervical, axillary lymphadenopathy present. Psych: Mood and affect are normal. Normally interactive                                                                                        Assessment & Plan:  #1 post spinal tap diffuse right sided thoracic pain with some right hip and right lower extremity numbness.  #2 mild cellulitis of excoriated lesion left posterior back  Plan: Tramadol  prescribed pending her making contact with Dr. Pearline Cables. A copy this note will be FAXed to Dr Pearline Cables. As I am uncomfortable evaluating & treating her neurologic condition; I asked her to consider seeing one of our [place] Neurologists who can optimize continuity of care in tandem with her Neurologic specialist @ [facility].  Topical antibiotic will be prescribed for the area of cellulitis after wet-to-dry dressings bid.

## 2013-07-08 NOTE — Patient Instructions (Addendum)
   A copy of the office note will be sent to Dr. Pearline Cables. I do recommend establishing with a Neurologist here to optimize continuity of care with [facility]. Dip gauze in  sterile saline and apply to the wound twice a day. Cover the wound with Telfa , non stick dressing after applying antibiotic ointment. The saline can be purchased at the drugstore or you can make your own .Boil cup of salt in a gallon of water. Store mixture  in a clean container.Report Warning  signs as discussed (red streaks, pus, fever, increasing pain).

## 2013-07-08 NOTE — Progress Notes (Signed)
Pre visit review using our clinic review tool, if applicable. No additional management support is needed unless otherwise documented below in the visit note. 

## 2013-07-09 ENCOUNTER — Encounter (HOSPITAL_COMMUNITY): Payer: Self-pay | Admitting: Emergency Medicine

## 2013-07-09 ENCOUNTER — Emergency Department (HOSPITAL_COMMUNITY)
Admission: EM | Admit: 2013-07-09 | Discharge: 2013-07-09 | Disposition: A | Discharge status: Home / Self Care | Payer: 59 | Attending: Emergency Medicine | Admitting: Emergency Medicine

## 2013-07-09 DIAGNOSIS — F17200 Nicotine dependence, unspecified, uncomplicated: Secondary | ICD-10-CM | POA: Insufficient documentation

## 2013-07-09 DIAGNOSIS — Z87442 Personal history of urinary calculi: Secondary | ICD-10-CM | POA: Insufficient documentation

## 2013-07-09 DIAGNOSIS — Z8619 Personal history of other infectious and parasitic diseases: Secondary | ICD-10-CM | POA: Insufficient documentation

## 2013-07-09 DIAGNOSIS — G8918 Other acute postprocedural pain: Secondary | ICD-10-CM | POA: Insufficient documentation

## 2013-07-09 DIAGNOSIS — R4789 Other speech disturbances: Secondary | ICD-10-CM | POA: Insufficient documentation

## 2013-07-09 DIAGNOSIS — M549 Dorsalgia, unspecified: Secondary | ICD-10-CM | POA: Insufficient documentation

## 2013-07-09 DIAGNOSIS — M546 Pain in thoracic spine: Secondary | ICD-10-CM | POA: Diagnosis not present

## 2013-07-09 DIAGNOSIS — Z87828 Personal history of other (healed) physical injury and trauma: Secondary | ICD-10-CM | POA: Insufficient documentation

## 2013-07-09 DIAGNOSIS — Z79899 Other long term (current) drug therapy: Secondary | ICD-10-CM | POA: Insufficient documentation

## 2013-07-09 DIAGNOSIS — L0390 Cellulitis, unspecified: Secondary | ICD-10-CM | POA: Diagnosis not present

## 2013-07-09 DIAGNOSIS — R6889 Other general symptoms and signs: Secondary | ICD-10-CM | POA: Diagnosis not present

## 2013-07-09 DIAGNOSIS — R21 Rash and other nonspecific skin eruption: Secondary | ICD-10-CM | POA: Diagnosis not present

## 2013-07-09 DIAGNOSIS — Z8711 Personal history of peptic ulcer disease: Secondary | ICD-10-CM | POA: Insufficient documentation

## 2013-07-09 DIAGNOSIS — IMO0002 Reserved for concepts with insufficient information to code with codable children: Secondary | ICD-10-CM | POA: Insufficient documentation

## 2013-07-09 DIAGNOSIS — Z862 Personal history of diseases of the blood and blood-forming organs and certain disorders involving the immune mechanism: Secondary | ICD-10-CM | POA: Insufficient documentation

## 2013-07-09 DIAGNOSIS — Z792 Long term (current) use of antibiotics: Secondary | ICD-10-CM | POA: Insufficient documentation

## 2013-07-09 DIAGNOSIS — K120 Recurrent oral aphthae: Secondary | ICD-10-CM | POA: Insufficient documentation

## 2013-07-09 DIAGNOSIS — G43709 Chronic migraine without aura, not intractable, without status migrainosus: Secondary | ICD-10-CM | POA: Insufficient documentation

## 2013-07-09 DIAGNOSIS — L0291 Cutaneous abscess, unspecified: Secondary | ICD-10-CM | POA: Diagnosis not present

## 2013-07-09 LAB — CBC WITH DIFFERENTIAL/PLATELET
Basophils Absolute: 0 10*3/uL (ref 0.0–0.1)
Basophils Relative: 0 % (ref 0–1)
Eosinophils Absolute: 0.1 10*3/uL (ref 0.0–0.7)
Eosinophils Relative: 1 % (ref 0–5)
HCT: 39.1 % (ref 36.0–46.0)
Hemoglobin: 13.1 g/dL (ref 12.0–15.0)
Lymphocytes Relative: 16 % (ref 12–46)
Lymphs Abs: 0.9 10*3/uL (ref 0.7–4.0)
MCH: 29.6 pg (ref 26.0–34.0)
MCHC: 33.5 g/dL (ref 30.0–36.0)
MCV: 88.3 fL (ref 78.0–100.0)
Monocytes Absolute: 0.5 10*3/uL (ref 0.1–1.0)
Monocytes Relative: 9 % (ref 3–12)
Neutro Abs: 4.4 10*3/uL (ref 1.7–7.7)
Neutrophils Relative %: 74 % (ref 43–77)
Platelets: 286 10*3/uL (ref 150–400)
RBC: 4.43 MIL/uL (ref 3.87–5.11)
RDW: 16.9 % — ABNORMAL HIGH (ref 11.5–15.5)
WBC: 5.9 10*3/uL (ref 4.0–10.5)

## 2013-07-09 LAB — I-STAT CHEM 8, ED
BUN: 9 mg/dL (ref 6–23)
Calcium, Ion: 1.19 mmol/L (ref 1.12–1.23)
Chloride: 98 mEq/L (ref 96–112)
Creatinine, Ser: 0.8 mg/dL (ref 0.50–1.10)
Glucose, Bld: 86 mg/dL (ref 70–99)
HCT: 44 % (ref 36.0–46.0)
Hemoglobin: 15 g/dL (ref 12.0–15.0)
Potassium: 3.4 mEq/L — ABNORMAL LOW (ref 3.7–5.3)
Sodium: 141 mEq/L (ref 137–147)
TCO2: 27 mmol/L (ref 0–100)

## 2013-07-09 LAB — I-STAT CG4 LACTIC ACID, ED: Lactic Acid, Venous: 0.85 mmol/L (ref 0.5–2.2)

## 2013-07-09 MED ORDER — SODIUM CHLORIDE 0.9 % IV BOLUS (SEPSIS)
1000.0000 mL | Freq: Once | INTRAVENOUS | Status: AC
  Administered 2013-07-09: 1000 mL via INTRAVENOUS

## 2013-07-09 MED ORDER — SULFAMETHOXAZOLE-TMP DS 800-160 MG PO TABS: 1.0000 | ORAL_TABLET | Freq: Two times a day (BID) | ORAL | Status: DC

## 2013-07-09 NOTE — ED Provider Notes (Signed)
  Face-to-face evaluation   History: She is here for the combined reasons, a rash and pain. Both her ongoing problems. Yesterday, her PCP, diagnosed cellulitis and treated it with Bactroban. Today, she went to a dermatologist, and was sent here for evaluation of right-sided pain, without diagnosis or treatment of the rash. She has daily vomiting. She has not had a documented fever. Yesterday. She went to see her PCP, for an unrelated problem, she was not troubled by the "cellulitis". She feels like her pain and swelling in the right side of her body, are progressive since her cervical epidural and lumbar puncture. Procedures were done 8 days ago, at to [facility]. She feels like her right anterior chest is swollen. She has a sensation of pain and tingling in the right arm, and the right anterior thigh. She is able to walk.  Physical exam: She is anxious and speaks with dysarthria, consistent with intoxication. She moves her arms, and legs normally. The neck is supple. Heart regular rate and rhythm. No murmur. Lungs clear to auscultation. Skin scattered excoriations consistent with nonspecific skin infection or trauma. There are no areas of cellulitis, fluctuants, abscess, or findings in the area of cervical and lumbar punctures that would be consistent with a local wound infection.   13:10- I discussed the case with Dr. Lorelle Gibbs, from [facility], who did her neck and back. Interventions, last week. It does not seem to be any acute infection related problem from the procedures. Dr. Pearline Cables wrote a prescription for Septra, yesterday, to treat the skin lesions. There is no indication for acute neuroimaging, or aggressive treatments.  Medical screening examination/treatment/procedure(s) were conducted as a shared visit with non-physician practitioner(s) and myself.  I personally evaluated the patient during the encounter  Richarda Blade, MD 07/09/13 2128

## 2013-07-09 NOTE — ED Provider Notes (Signed)
CSN: [id number]     Arrival date & time 07/09/13  1125 History   First MD Initiated Contact with Patient 07/09/13 1203     Chief Complaint  Patient presents with  . Cellulitis     (Consider location/radiation/quality/duration/timing/severity/associated sxs/prior Treatment) HPI  33 year old female with history of von Willebrand disease, chronic migraine, chronic staph infection who was brought here via EMS for evaluation of dizziness, and right-sided numbness. Patient reports she has a history of CSF leak and often require lumbar puncture to check level of blood patch. She had lumbar puncture performed a week ago. At that time it was deemed that her CSF pressure is normal. Current lumbar puncture she begins to develop burning pain to her up with coastal period of next couple of days pain seems to radiate towards right arm, and right-sided upper chest. She noticed swelling to her right breast, and her right arm. She also experiencing tingling and numbness sensation to her right arm , burning sensation to her right thigh and some mild numbness sensation to her right legs as well. She reports subjective fever, facial swelling, and now reporting dizziness which she describes as a room spinning sensation. She followup with her primary care doctor yesterday for evaluation of her sxs.  Her doctor notice cellulitic skin changes to her upper and mid back near injection site concerning for recurrent staph infection which she has in the past.  She was given bactroban cream and her record was sent to her specialist at [facility], who scheduled pt to f/u with dermatologist today.  Pt show up for her appointment, her dermatologist felt her sxs is not dermatological and sent her to ER for further evaluation.  Pt currently denies cp, sob, productive cough, abd pain, urinary/bowel incontinence or saddle paresthesia.  Did felt nauseous and vomited last night, none today.    Past Medical History  Diagnosis Date  . Migraines    . Von Willebrand disease   . Anemia   . Nephrolithiasis     X5, Dr Jeffie Pollock  . Peptic ulcer disease   . Concussion     x8, last 2010, residual homonymous Hemianopsia  . Migraines   . Mastoiditis    Past Surgical History  Procedure Laterality Date  . Appendectomy    . Hand tendon surgery      left, complicated by MRSA   Family History  Problem Relation Age of Onset  . Arthritis Father   . Hyperlipidemia Father   . Diabetes Father   . Stroke Father     PTE post CVA  . Prostate cancer Paternal Grandfather   . Hyperlipidemia Paternal Grandfather   . Diabetes Paternal Grandfather   . Heart disease Mother   . Heart disease Maternal Grandmother   . Diabetes Maternal Grandmother   . Heart disease Maternal Grandfather   . Heart disease Maternal Aunt   . Stroke Maternal Aunt   . Heart disease Maternal Uncle   . Heart disease Brother    History  Substance Use Topics  . Smoking status: Current Some Day Smoker -- 0.30 packs/day for 10 years    Types: Cigarettes  . Smokeless tobacco: Never Used     Comment: 1 cig daily, trying to quit   . Alcohol Use: No   OB History   Grav Para Term Preterm Abortions TAB SAB Ect Mult Living                 Review of Systems  All other systems reviewed and  are negative.     Allergies  Aspirin; Levofloxacin; Other; and Topamax  Home Medications   Prior to Admission medications   Medication Sig Start Date End Date Taking? Authorizing Provider  butalbital-acetaminophen-caffeine (ESGIC) 50-325-40 MG per tablet Take 1 tablet by mouth as needed for migraine.    Yes Historical Provider, MD  diltiazem (DILACOR XR) 120 MG 24 hr capsule Take 120 mg by mouth daily.   Yes Historical Provider, MD  fluticasone (FLONASE) 50 MCG/ACT nasal spray Place 1 spray into both nostrils daily.   Yes Historical Provider, MD  gabapentin (NEURONTIN) 100 MG capsule Take 100 mg by mouth every 8 (eight) hours.   Yes Historical Provider, MD  levonorgestrel (MIRENA)  20 MCG/24HR IUD 1 each by Intrauterine route once.   Yes Historical Provider, MD  losartan-hydrochlorothiazide (HYZAAR) 100-25 MG per tablet Take 0.5 tablets by mouth daily. 1/2 qd 04/23/13  Yes Hendricks Limes, MD  metoCLOPramide (REGLAN) 5 MG/5ML solution Take 5 mLs (5 mg total) by mouth every 8 (eight) hours as needed for nausea. 05/13/13  Yes Hendricks Limes, MD  mupirocin ointment (BACTROBAN) 2 % Applied twice a day to the affected area;NOT into eyes. 07/08/13  Yes Hendricks Limes, MD  omeprazole (PRILOSEC) 20 MG capsule Take 20 mg by mouth daily.   Yes Historical Provider, MD  potassium chloride SA (K-DUR,KLOR-CON) 20 MEQ tablet Take 20 mEq by mouth daily.   Yes Historical Provider, MD  promethazine (PHENERGAN) 12.5 MG tablet Take 12.5 mg by mouth every 6 (six) hours as needed for nausea or vomiting.   Yes Historical Provider, MD  traMADol (ULTRAM) 50 MG tablet Take 50 mg by mouth every 6 (six) hours as needed for moderate pain.   Yes Historical Provider, MD   BP 150/59  Pulse 69  Temp(Src) 97.9 F (36.6 C) (Oral)  Resp 18  Ht 5\' 5"  (1.651 m)  Wt 135 lb (61.236 kg)  BMI 22.47 kg/m2  SpO2 100%  LMP 06/18/2013 Physical Exam  Nursing note and vitals reviewed. Constitutional: She is oriented to person, place, and time. She appears well-developed and well-nourished.  Patient is ill-appearing but appears to be in no acute distress.  HENT:  Head: Normocephalic and atraumatic.  Mouth/Throat: Oropharynx is clear and moist.  Aphthous ulcers noted to corner of lips  Eyes: Conjunctivae and EOM are normal. Pupils are equal, round, and reactive to light.  Neck: Normal range of motion. Neck supple.  No nuchal rigidity  Cardiovascular: Normal rate and regular rhythm.   Pulmonary/Chest: Effort normal and breath sounds normal.  Abdominal: Soft. There is no tenderness.  Musculoskeletal: She exhibits no edema.  No significant midline spine tenderness, crepitus, emphysema or step off.   Neurological: She is alert and oriented to person, place, and time. She has normal reflexes. She displays normal reflexes. No cranial nerve deficit. She exhibits normal muscle tone. Coordination normal.  Speech with mild slurring, pupils equal round reactive to light, but poor extra ocular movement Normal peripheral visual fields Cranial nerves III through XII normal including no facial droop Follows commands, moves all extremities x4, R grip strength weaker than left. Sensation normal to light touch Coordination intact, no limb ataxia, finger-nose-finger normal Rapid alternating movements normal No pronator drift Gait normal   Skin: Rash (pt has several macular erythematous small lesions noted to posterior cervical region an one pea size area of necrotic skin changes to mid thoracic and mild surrounding erythema.  ) noted.    ED Course  Procedures (including critical care time)  12:39 PM Pt with pain, discomfort at the spinal tap site which radiates to her R extremities, now with dizziness.  Plan to r/o possible infection from recent spinal tap vs. Hematoma from the tap causing her sxs.  Care discussed with Dr. Eulis Foster.  1:37 PM Dr. Eulis Foster has evaluated pt and felt suspicion of spinal infection is low.  Her sxs can best be manage through her neurologist at [facility].  We will also provide referral to [facility] for further care.  Pt to continue using bactroban and can start her bactrim for her skin infection.  She otherwise does not need advance imaging at this time.  Her labs are reassuring.      Labs Review Labs Reviewed  CBC WITH DIFFERENTIAL - Abnormal; Notable for the following:    RDW 16.9 (*)    All other components within normal limits  I-STAT CHEM 8, ED - Abnormal; Notable for the following:    Potassium 3.4 (*)    All other components within normal limits  I-STAT CG4 LACTIC ACID, ED    Imaging Review No results found.   EKG Interpretation None      MDM   Final  diagnoses:  Back pain  Rash    BP 90/54  Pulse 57  Temp(Src) 97.9 F (36.6 C) (Oral)  Resp 18  Ht 5\' 5"  (1.651 m)  Wt 135 lb (61.236 kg)  BMI 22.47 kg/m2  SpO2 98%  LMP 06/18/2013     Domenic Moras, PA-C 07/09/13 1422

## 2013-07-09 NOTE — ED Notes (Signed)
Per ems, pt has chronic staph infections, had a lumbar puncture last Thursday which got infected, pt states its spread around to the top of ribs on each side, spreading up her back to each shoulder blade. Pt in NAD, ambulatory in room at this time. Dr. At [facility] sent her to see a dermatologist, dermatologist refused to see patient due to fever like symptoms and dizziness. Pt had nausea/vomiting last night, none today.

## 2013-07-09 NOTE — Discharge Instructions (Signed)
Take bactrim and use bactroban cream as treatment for your skin infection.  Follow closely with [facility] for further evaluation of your arm and leg numbness.  Follow up with your doctor for further care.  Return to ER if your symptoms worsen or if you have other concerns.  MRSA Overview MRSA stands for methicillin-resistant Staphylococcus aureus. It is a type of bacteria that is resistant to some common antibiotics. It can cause infections in the skin and many other places in the body. Staphylococcus aureus, often called "staph," is a bacteria that normally lives on the skin or in the nose. Staph on the surface of the skin or in the nose does not cause problems. However, if the staph enters the body through a cut, wound, or break in the skin, an infection can happen. Up until recently, infections with the MRSA type of staph mainly occurred in hospitals and other healthcare settings. There are now increasing problems with MRSA infections in the community as well. Infections with MRSA may be very serious or even life-threatening. Most MRSA infections are acquired in one of two ways:  Healthcare-associated MRSA (HA-MRSA)  This can be acquired by people in any healthcare setting. MRSA can be a big problem for hospitalized people, people in nursing homes, people in rehabilitation facilities, people with weakened immune systems, dialysis patients, and those who have had surgery.  Community-associated MRSA (CA-MRSA)  Community spread of MRSA is becoming more common. It is known to spread in crowded settings, in jails and prisons, and in situations where there is close skin-to-skin contact, such as during sporting events or in locker rooms. MRSA can be spread through shared items, such as children's toys, razors, towels, or sports equipment. CAUSES  All staph, including MRSA, are normally harmless unless they enter the body through a scratch, cut, or wound, such as with surgery. All staph, including  MRSA, can be spread from person-to-person by touching contaminated objects or through direct contact. SPECIAL GROUPS MRSA can present problems for special groups of people. Some of these groups include:  Breastfeeding women.  The most common problem is MRSA infection of the breast (mastitis). There is evidence that MRSA can be passed to an infant from infected breast milk. Your caregiver may recommend that you stop breastfeeding until the mastitis is under control.  If you are breastfeeding and have a MRSA infection in a place other than the breast, you may usually continue breastfeeding while under treatment. If taking antibiotics, ask your caregiver if it is safe to continue breastfeeding while taking your prescribed medicines.  Neonates (babies from birth to 68 month old) and infants (babies from 28 month to 3 year old).  There is evidence that MRSA can be passed to a newborn at birth if the mother has MRSA on the skin, in or around the birth canal, or an infection in the uterus, cervix, or vagina. MRSA infection can have the same appearance as a normal newborn or infant rash or several other skin infections. This can make it hard to diagnose MRSA.  Immune compromised people.  If you have an immune system problem, you may have a higher chance of developing a MRSA infection.  People after any type of surgery.  Staph in general, including MRSA, is the most common cause of infections occurring at the site of recent surgery.  People on long-term steroid medicines.  These kinds of medicines can lower your resistance to infection. This can increase your chance of getting MRSA.  People who  have had frequent hospitalizations, live in nursing homes or other residential care facilities, have venous or urinary catheters, or have taken multiple courses of antibiotic therapy for any reason. DIAGNOSIS  Diagnosis of MRSA is done by cultures of fluid samples that may come from:  Swabs taken from cuts  or wounds in infected areas.  Nasal swabs.  Saliva or deep cough specimens from the lungs (sputum).  Urine.  Blood. Many people are "colonized" with MRSA but have no signs of infection. This means that people carry the MRSA germ on their skin or in their nose and may never develop MRSA infection.  TREATMENT  Treatment varies and is based on how serious, how deep, or how extensive the infection is. For example:  Some skin infections, such as a small boil or abscess, may be treated by draining yellowish-white fluid (pus) from the site of the infection.  Deeper or more widespread soft tissue infections are usually treated with surgery to drain pus and with antibiotic medicine given by vein or by mouth. This may be recommended even if you are pregnant.  Serious infections may require a hospital stay. If antibiotics are given, they may be needed for several weeks. PREVENTION  Because many people are colonized with staph, including MRSA, preventing the spread of the bacteria from person-to-person is most important. The best way to prevent the spread of bacteria and other germs is through proper hand washing or by using alcohol-based hand disinfectants. The following are other ways to help prevent MRSA infection within the hospital and community settings.   Healthcare settings:  Strict hand washing or hand disinfection procedures need to be followed before and after touching every patient.  Patients infected with MRSA are placed in isolation to prevent the spread of the bacteria.  Healthcare workers need to wear disposable gowns and gloves when touching or caring for patients infected with MRSA. Visitors may also be asked to wear a gown and gloves.  Hospital surfaces need to be disinfected frequently.  Community settings:  [employer] frequently with soap and water for at least 15 seconds. Otherwise, use alcohol-based hand disinfectants when soap and water is not available.  Make  sure people who live with you wash their hands often, too.  Do not share personal items. For example, avoid sharing razors and other personal hygiene items, towels, clothing, and athletic equipment.  Wash and dry your clothes and bedding at the warmest temperatures recommended on the labels.  Keep wounds covered. Pus from infected sores may contain MRSA and other bacteria. Keep cuts and abrasions clean and covered with germ-free (sterile), dry bandages until they are healed.  If you have a wound that appears infected, ask your caregiver if a culture for MRSA and other bacteria should be done.  If you are breastfeeding, talk to your caregiver about MRSA. You may be asked to temporarily stop breastfeeding. HOME CARE INSTRUCTIONS   Take your antibiotics as directed. Finish them even if you start to feel better.  Avoid close contact with those around you as much as possible. Do not use towels, razors, toothbrushes, bedding, or other items that will be used by others.  To fight the infection, follow your caregiver's instructions for wound care. Wash your hands before and after changing your bandages.  If you have an intravascular device, such as a catheter, make sure you know how to care for it.  Be sure to tell any healthcare providers that you have MRSA so they are aware of  your infection. SEEK IMMEDIATE MEDICAL CARE IF:   The infection appears to be getting worse. Signs include:  Increased warmth, redness, or tenderness around the wound site.  A red line that extends from the infection site.  A dark color in the area around the infection.  Wound drainage that is tan, yellow, or green.  A bad smell coming from the wound.  You feel sick to your stomach (nauseous) and throw up (vomit) or cannot keep medicine down.  You have a fever.  Your baby is older than 3 months with a rectal temperature of 102 F (38.9 C) or higher.  Your baby is 37 months old or younger with a rectal  temperature of 100.4 F (38 C) or higher.  You have difficulty breathing. MAKE SURE YOU:   Understand these instructions.  Will watch your condition.  Will get help right away if you are not doing well or get worse. Document Released: 01/28/2005 Document Revised: 04/22/2011 Document Reviewed: 05/02/2010 [facility] Patient Information 2014 [place], [place].

## 2013-07-09 NOTE — ED Provider Notes (Signed)
Medical screening examination/treatment/procedure(s) were performed by non-physician practitioner and as supervising physician I was immediately available for consultation/collaboration.  Richarda Blade, MD 07/09/13 2128

## 2013-08-11 DIAGNOSIS — R51 Headache: Secondary | ICD-10-CM | POA: Diagnosis not present

## 2013-08-11 DIAGNOSIS — M549 Dorsalgia, unspecified: Secondary | ICD-10-CM | POA: Diagnosis not present

## 2013-08-12 DIAGNOSIS — R519 Headache, unspecified: Secondary | ICD-10-CM | POA: Insufficient documentation

## 2013-08-12 DIAGNOSIS — M549 Dorsalgia, unspecified: Secondary | ICD-10-CM | POA: Insufficient documentation

## 2013-08-12 DIAGNOSIS — R51 Headache: Secondary | ICD-10-CM

## 2013-08-12 DIAGNOSIS — M542 Cervicalgia: Secondary | ICD-10-CM | POA: Insufficient documentation

## 2013-08-17 DIAGNOSIS — Z23 Encounter for immunization: Secondary | ICD-10-CM | POA: Diagnosis not present

## 2013-08-17 DIAGNOSIS — N200 Calculus of kidney: Secondary | ICD-10-CM | POA: Diagnosis not present

## 2013-08-17 DIAGNOSIS — IMO0002 Reserved for concepts with insufficient information to code with codable children: Secondary | ICD-10-CM | POA: Diagnosis not present

## 2013-08-17 DIAGNOSIS — J329 Chronic sinusitis, unspecified: Secondary | ICD-10-CM | POA: Diagnosis not present

## 2013-08-24 ENCOUNTER — Other Ambulatory Visit: Payer: Self-pay | Admitting: Internal Medicine

## 2013-08-27 DIAGNOSIS — M47817 Spondylosis without myelopathy or radiculopathy, lumbosacral region: Secondary | ICD-10-CM | POA: Diagnosis not present

## 2013-08-27 DIAGNOSIS — M5030 Other cervical disc degeneration, unspecified cervical region: Secondary | ICD-10-CM | POA: Diagnosis not present

## 2013-08-27 DIAGNOSIS — M5020 Other cervical disc displacement, unspecified cervical region: Secondary | ICD-10-CM | POA: Diagnosis not present

## 2013-09-08 DIAGNOSIS — J0100 Acute maxillary sinusitis, unspecified: Secondary | ICD-10-CM | POA: Diagnosis not present

## 2013-09-08 DIAGNOSIS — IMO0002 Reserved for concepts with insufficient information to code with codable children: Secondary | ICD-10-CM | POA: Diagnosis not present

## 2013-09-08 DIAGNOSIS — G43809 Other migraine, not intractable, without status migrainosus: Secondary | ICD-10-CM | POA: Diagnosis not present

## 2013-09-30 ENCOUNTER — Encounter (HOSPITAL_COMMUNITY): Payer: Self-pay | Admitting: Emergency Medicine

## 2013-09-30 ENCOUNTER — Emergency Department (HOSPITAL_COMMUNITY)
Admission: EM | Admit: 2013-09-30 | Discharge: 2013-10-01 | Disposition: A | Discharge status: Home / Self Care | Payer: 59 | Attending: Emergency Medicine | Admitting: Emergency Medicine

## 2013-09-30 ENCOUNTER — Emergency Department (HOSPITAL_COMMUNITY): Payer: 59

## 2013-09-30 DIAGNOSIS — F17200 Nicotine dependence, unspecified, uncomplicated: Secondary | ICD-10-CM | POA: Diagnosis not present

## 2013-09-30 DIAGNOSIS — S199XXA Unspecified injury of neck, initial encounter: Secondary | ICD-10-CM | POA: Insufficient documentation

## 2013-09-30 DIAGNOSIS — Y92009 Unspecified place in unspecified non-institutional (private) residence as the place of occurrence of the external cause: Secondary | ICD-10-CM | POA: Diagnosis not present

## 2013-09-30 DIAGNOSIS — Z87828 Personal history of other (healed) physical injury and trauma: Secondary | ICD-10-CM | POA: Diagnosis not present

## 2013-09-30 DIAGNOSIS — Z79899 Other long term (current) drug therapy: Secondary | ICD-10-CM | POA: Diagnosis not present

## 2013-09-30 DIAGNOSIS — Z8679 Personal history of other diseases of the circulatory system: Secondary | ICD-10-CM | POA: Insufficient documentation

## 2013-09-30 DIAGNOSIS — W1809XA Striking against other object with subsequent fall, initial encounter: Secondary | ICD-10-CM | POA: Insufficient documentation

## 2013-09-30 DIAGNOSIS — Y9389 Activity, other specified: Secondary | ICD-10-CM | POA: Insufficient documentation

## 2013-09-30 DIAGNOSIS — Z8719 Personal history of other diseases of the digestive system: Secondary | ICD-10-CM | POA: Insufficient documentation

## 2013-09-30 DIAGNOSIS — W19XXXA Unspecified fall, initial encounter: Secondary | ICD-10-CM

## 2013-09-30 DIAGNOSIS — R0789 Other chest pain: Secondary | ICD-10-CM

## 2013-09-30 DIAGNOSIS — S0993XA Unspecified injury of face, initial encounter: Secondary | ICD-10-CM | POA: Insufficient documentation

## 2013-09-30 DIAGNOSIS — S298XXA Other specified injuries of thorax, initial encounter: Secondary | ICD-10-CM | POA: Diagnosis not present

## 2013-09-30 DIAGNOSIS — Z862 Personal history of diseases of the blood and blood-forming organs and certain disorders involving the immune mechanism: Secondary | ICD-10-CM | POA: Insufficient documentation

## 2013-09-30 DIAGNOSIS — S0990XA Unspecified injury of head, initial encounter: Secondary | ICD-10-CM

## 2013-09-30 DIAGNOSIS — IMO0002 Reserved for concepts with insufficient information to code with codable children: Secondary | ICD-10-CM | POA: Diagnosis not present

## 2013-09-30 DIAGNOSIS — Z87442 Personal history of urinary calculi: Secondary | ICD-10-CM | POA: Insufficient documentation

## 2013-09-30 DIAGNOSIS — Z8669 Personal history of other diseases of the nervous system and sense organs: Secondary | ICD-10-CM | POA: Insufficient documentation

## 2013-09-30 LAB — CBC WITH DIFFERENTIAL/PLATELET
Basophils Absolute: 0 10*3/uL (ref 0.0–0.1)
Basophils Relative: 0 % (ref 0–1)
Eosinophils Absolute: 0 10*3/uL (ref 0.0–0.7)
Eosinophils Relative: 1 % (ref 0–5)
HCT: 41.2 % (ref 36.0–46.0)
Hemoglobin: 14.1 g/dL (ref 12.0–15.0)
Lymphocytes Relative: 30 % (ref 12–46)
Lymphs Abs: 2.1 10*3/uL (ref 0.7–4.0)
MCH: 29.2 pg (ref 26.0–34.0)
MCHC: 34.2 g/dL (ref 30.0–36.0)
MCV: 85.3 fL (ref 78.0–100.0)
Monocytes Absolute: 0.6 10*3/uL (ref 0.1–1.0)
Monocytes Relative: 8 % (ref 3–12)
Neutro Abs: 4.2 10*3/uL (ref 1.7–7.7)
Neutrophils Relative %: 61 % (ref 43–77)
Platelets: 277 10*3/uL (ref 150–400)
RBC: 4.83 MIL/uL (ref 3.87–5.11)
RDW: 17 % — ABNORMAL HIGH (ref 11.5–15.5)
WBC: 6.9 10*3/uL (ref 4.0–10.5)

## 2013-09-30 LAB — BASIC METABOLIC PANEL
Anion gap: 12 (ref 5–15)
BUN: 9 mg/dL (ref 6–23)
CO2: 26 mEq/L (ref 19–32)
Calcium: 9 mg/dL (ref 8.4–10.5)
Chloride: 102 mEq/L (ref 96–112)
Creatinine, Ser: 0.76 mg/dL (ref 0.50–1.10)
GFR calc Af Amer: >90 mL/min (ref >90)
GFR calc non Af Amer: >90 mL/min (ref >90)
Glucose, Bld: 90 mg/dL (ref 70–99)
Potassium: 3.6 mEq/L — ABNORMAL LOW (ref 3.7–5.3)
Sodium: 140 mEq/L (ref 137–147)

## 2013-09-30 MED ORDER — HYDROMORPHONE HCL PF 1 MG/ML IJ SOLN
1.0000 mg | Freq: Once | INTRAMUSCULAR | Status: AC
  Administered 2013-09-30: 1 mg via INTRAVENOUS
  Filled 2013-09-30: qty 1

## 2013-09-30 MED ORDER — SODIUM CHLORIDE 0.9 % IV BOLUS (SEPSIS)
250.0000 mL | Freq: Once | INTRAVENOUS | Status: AC
  Administered 2013-09-30: 250 mL via INTRAVENOUS

## 2013-09-30 MED ORDER — SODIUM CHLORIDE 0.9 % IV SOLN
INTRAVENOUS | Status: DC
  Administered 2013-09-30: 22:00:00 via INTRAVENOUS

## 2013-09-30 MED ORDER — PROMETHAZINE HCL 25 MG/ML IJ SOLN
12.5000 mg | Freq: Once | INTRAMUSCULAR | Status: AC
  Administered 2013-09-30: 12.5 mg via INTRAVENOUS
  Filled 2013-09-30: qty 1

## 2013-09-30 MED ORDER — SODIUM CHLORIDE 0.9 % IV SOLN: INTRAVENOUS | Status: DC

## 2013-09-30 MED ORDER — METOCLOPRAMIDE HCL 5 MG/ML IJ SOLN
10.0000 mg | Freq: Once | INTRAMUSCULAR | Status: AC
  Administered 2013-09-30: 10 mg via INTRAVENOUS
  Filled 2013-09-30: qty 2

## 2013-09-30 MED ORDER — SODIUM CHLORIDE 0.9 % IV BOLUS (SEPSIS)
500.0000 mL | Freq: Once | INTRAVENOUS | Status: AC
  Administered 2013-09-30: 500 mL via INTRAVENOUS

## 2013-09-30 NOTE — ED Provider Notes (Signed)
CSN: [id number]     Arrival date & time 09/30/13  1954 History   First MD Initiated Contact with Patient 09/30/13 2012    This chart was scribed for Fredia Sorrow, MD by Terressa Koyanagi, ED Scribe. This patient was seen in room APA09/APA09 and the patient's care was started at 8:51 PM.  Chief Complaint  Patient presents with  . Fall   The history is provided by the spouse and the patient. No language interpreter was used.   HPI Comments: LARITZA VOKES is a 33 y.o. female, with medical Hx noted below and significant for multiple concussions, migraines, Von Willebrand disease, and CSF leaks, who presents to the Emergency Department complaining of a fall sustained this evening around 7pm. Per spouse, pt fell over a dog gate in the home and hit her head. Pt's spouse denies loc, however, notes that she has not been acting like herself since the accident. Spouse further reports that pt has been complaining of associated nausea, right sided headache and neck pain. Pt rates her headache a 10 out of 10 and notes that prior to the fall she had a mild, dull HA. However, pt notes that prior to the fall her HA was left sided.   Past Medical History  Diagnosis Date  . Migraines   . Von Willebrand disease   . Anemia   . Nephrolithiasis     X5, Dr Jeffie Pollock  . Peptic ulcer disease   . Concussion     x8, last 2010, residual homonymous Hemianopsia  . Migraines   . Mastoiditis    Past Surgical History  Procedure Laterality Date  . Appendectomy    . Hand tendon surgery      left, complicated by MRSA   Family History  Problem Relation Age of Onset  . Arthritis Father   . Hyperlipidemia Father   . Diabetes Father   . Stroke Father     PTE post CVA  . Prostate cancer Paternal Grandfather   . Hyperlipidemia Paternal Grandfather   . Diabetes Paternal Grandfather   . Heart disease Mother   . Heart disease Maternal Grandmother   . Diabetes Maternal Grandmother   . Heart disease Maternal Grandfather    . Heart disease Maternal Aunt   . Stroke Maternal Aunt   . Heart disease Maternal Uncle   . Heart disease Brother    History  Substance Use Topics  . Smoking status: Current Some Day Smoker -- 0.30 packs/day for 10 years    Types: Cigarettes  . Smokeless tobacco: Never Used     Comment: 1 cig daily, trying to quit   . Alcohol Use: No   OB History   Grav Para Term Preterm Abortions TAB SAB Ect Mult Living                 Review of Systems  Constitutional: Negative for fever and chills.  HENT: Negative for rhinorrhea and sore throat.   Eyes: Positive for visual disturbance.  Respiratory: Negative for cough and shortness of breath.   Cardiovascular: Positive for chest pain (lower chest pain on right side). Negative for leg swelling.  Gastrointestinal: Positive for nausea. Negative for vomiting, abdominal pain and diarrhea.  Genitourinary: Negative for dysuria and hematuria.  Musculoskeletal: Positive for back pain (at baseline) and neck pain.  Skin: Negative for rash.  Neurological: Positive for headaches.  Hematological: Bruises/bleeds easily.      Allergies  Aspirin; Levofloxacin; Other; and Topamax  Home Medications   Prior  to Admission medications   Medication Sig Start Date End Date Taking? Authorizing Provider  diltiazem (DILACOR XR) 120 MG 24 hr capsule Take 120 mg by mouth daily.   Yes Historical Provider, MD  fluticasone (FLONASE) 50 MCG/ACT nasal spray Place 1 spray into both nostrils daily.   Yes Historical Provider, MD  losartan-hydrochlorothiazide (HYZAAR) 100-25 MG per tablet Take 0.5 tablets by mouth daily. 1/2 qd 04/23/13  Yes Hendricks Limes, MD  omeprazole (PRILOSEC) 20 MG capsule Take 20 mg by mouth daily.   Yes Historical Provider, MD  promethazine (PHENERGAN) 12.5 MG tablet Take 12.5 mg by mouth every 6 (six) hours as needed for nausea or vomiting.   Yes Historical Provider, MD  butalbital-acetaminophen-caffeine (ESGIC) 50-325-40 MG per tablet Take 1  tablet by mouth as needed for migraine.     Historical Provider, MD  levonorgestrel (MIRENA) 20 MCG/24HR IUD 1 each by Intrauterine route once.    Historical Provider, MD   Triage Vitals: BP 150/86  Pulse 93  Temp(Src) 97.9 F (36.6 C) (Oral)  Resp 18  Ht 5\' 5"  (1.651 m)  Wt 130 lb (58.968 kg)  BMI 21.63 kg/m2  SpO2 99% Physical Exam  Nursing note and vitals reviewed. Constitutional: She is oriented to person, place, and time. She appears well-developed and well-nourished. No distress.  HENT:  Head: Normocephalic and atraumatic.  Eyes: Conjunctivae and EOM are normal.  Neck: Neck supple. No tracheal deviation present.  Cardiovascular: Normal rate, regular rhythm and normal heart sounds.   Pulmonary/Chest: Effort normal. No respiratory distress.  Abdominal: Bowel sounds are normal. There is tenderness (RUQ mostly over right side of ribs).  Musculoskeletal: Normal range of motion. She exhibits no edema.  Neurological: She is alert and oriented to person, place, and time. She has normal reflexes. She displays normal reflexes. No cranial nerve deficit. Coordination normal.  Skin: Skin is warm and dry.    ED Course  Procedures (including critical care time) DIAGNOSTIC STUDIES: Oxygen Saturation is 99% on RA, nl by my interpretation.    COORDINATION OF CARE: 9:03 PM-Discussed treatment plan with pt and pt's relative at bedside. Patient verbalizes understanding and agrees with treatment plan.   Labs Review Labs Reviewed  CBC WITH DIFFERENTIAL - Abnormal; Notable for the following:    RDW 17.0 (*)    All other components within normal limits  BASIC METABOLIC PANEL - Abnormal; Notable for the following:    Potassium 3.6 (*)    All other components within normal limits   Results for orders placed during the hospital encounter of 09/30/13  CBC WITH DIFFERENTIAL      Result Value Ref Range   WBC 6.9  4.0 - 10.5 K/uL   RBC 4.83  3.87 - 5.11 MIL/uL   Hemoglobin 14.1  12.0 - 15.0  g/dL   HCT 41.2  36.0 - 46.0 %   MCV 85.3  78.0 - 100.0 fL   MCH 29.2  26.0 - 34.0 pg   MCHC 34.2  30.0 - 36.0 g/dL   RDW 17.0 (*) 11.5 - 15.5 %   Platelets 277  150 - 400 K/uL   Neutrophils Relative % 61  43 - 77 %   Neutro Abs 4.2  1.7 - 7.7 K/uL   Lymphocytes Relative 30  12 - 46 %   Lymphs Abs 2.1  0.7 - 4.0 K/uL   Monocytes Relative 8  3 - 12 %   Monocytes Absolute 0.6  0.1 - 1.0 K/uL   Eosinophils  Relative 1  0 - 5 %   Eosinophils Absolute 0.0  0.0 - 0.7 K/uL   Basophils Relative 0  0 - 1 %   Basophils Absolute 0.0  0.0 - 0.1 K/uL  BASIC METABOLIC PANEL      Result Value Ref Range   Sodium 140  137 - 147 mEq/L   Potassium 3.6 (*) 3.7 - 5.3 mEq/L   Chloride 102  96 - 112 mEq/L   CO2 26  19 - 32 mEq/L   Glucose, Bld 90  70 - 99 mg/dL   BUN 9  6 - 23 mg/dL   Creatinine, Ser 0.76  0.50 - 1.10 mg/dL   Calcium 9.0  8.4 - 10.5 mg/dL   GFR calc non Af Amer >90  >90 mL/min   GFR calc Af Amer >90  >90 mL/min   Anion gap 12  5 - 15     Imaging Review Ct Head Wo Contrast  09/30/2013   CLINICAL DATA:  [place].  Hit head.  EXAM: CT HEAD WITHOUT CONTRAST  CT CERVICAL SPINE WITHOUT CONTRAST  TECHNIQUE: Multidetector CT imaging of the head and cervical spine was performed following the standard protocol without intravenous contrast. Multiplanar CT image reconstructions of the cervical spine were also generated.  COMPARISON:  12/17/2012  FINDINGS: CT HEAD FINDINGS  The ventricles are normal in size and configuration. No extra-axial fluid collections are identified. The gray-white differentiation is normal. No CT findings for acute intracranial process such as hemorrhage or infarction. No mass lesions. The brainstem and cerebellum are grossly normal.  The bony structures are intact. The paranasal sinuses and mastoid air cells are clear. The globes are intact.  CT CERVICAL SPINE FINDINGS  Normal alignment of the cervical vertebral bodies. Disc spaces and vertebral bodies are maintained.  Degenerative disc disease noted at C5-6 with osteophytic spurring. No acute fracture or abnormal prevertebral soft tissue swelling. The facets are normally aligned. The skullbase C1 and C1-2 articulations are maintained. The dens is normal. No large disc protrusions, spinal or foraminal stenosis. The lung apices are clear.  IMPRESSION: No acute intracranial findings or skull fracture.  Normal alignment of cervical vertebral bodies and no acute fracture.   Electronically Signed   By: Kalman Jewels M.D.   On: 09/30/2013 22:30   Ct Cervical Spine Wo Contrast  09/30/2013   CLINICAL DATA:  [place].  Hit head.  EXAM: CT HEAD WITHOUT CONTRAST  CT CERVICAL SPINE WITHOUT CONTRAST  TECHNIQUE: Multidetector CT imaging of the head and cervical spine was performed following the standard protocol without intravenous contrast. Multiplanar CT image reconstructions of the cervical spine were also generated.  COMPARISON:  12/17/2012  FINDINGS: CT HEAD FINDINGS  The ventricles are normal in size and configuration. No extra-axial fluid collections are identified. The gray-white differentiation is normal. No CT findings for acute intracranial process such as hemorrhage or infarction. No mass lesions. The brainstem and cerebellum are grossly normal.  The bony structures are intact. The paranasal sinuses and mastoid air cells are clear. The globes are intact.  CT CERVICAL SPINE FINDINGS  Normal alignment of the cervical vertebral bodies. Disc spaces and vertebral bodies are maintained. Degenerative disc disease noted at C5-6 with osteophytic spurring. No acute fracture or abnormal prevertebral soft tissue swelling. The facets are normally aligned. The skullbase C1 and C1-2 articulations are maintained. The dens is normal. No large disc protrusions, spinal or foraminal stenosis. The lung apices are clear.  IMPRESSION: No acute intracranial findings or skull  fracture.  Normal alignment of cervical vertebral bodies and no acute fracture.    Electronically Signed   By: Kalman Jewels M.D.   On: 09/30/2013 22:30     EKG Interpretation None      MDM   Final diagnoses:  Fall, initial encounter  Chest wall pain  Head injury, initial encounter    Patient followed at [facility] for CSF leaks from the cervical part of her spine. Patient stumbled over a child type gait striking the right side of her head and lower part of her chest. Patient CT of head and neck without any acute injuries. No evidence of any blood. Patient did have a headache on that side improved with Reglan and Phenergan. Patient redosed prior to discharge with hydromorphone. Patient also has a history of von Willebrand's disease. History of frequent concussions. Patient improved significantly in the emergency department. Patient has some mild tenderness at the right anterior rib area at the lower ribs. No respiratory distress no crepitance. No significant tenderness to the right upper quadrant. Patient will require close followup with her doctors at [facility] which she is in close contact with. She will followup with them. Patient will return for any newer worse symptoms.  I personally performed the services described in this documentation, which was scribed in my presence. The recorded information has been reviewed and is accurate.     Fredia Sorrow, MD 09/30/13 2322

## 2013-09-30 NOTE — Discharge Instructions (Signed)
Follow up closely with your doctors at the [facility]. Return for any new or worse symptoms. Today's head CT and CT of neck without any significant findings.

## 2013-09-30 NOTE — ED Notes (Signed)
Pt fell over gate in home and since then not acting herself.

## 2013-11-03 ENCOUNTER — Ambulatory Visit (INDEPENDENT_AMBULATORY_CARE_PROVIDER_SITE_OTHER)
Admission: RE | Admit: 2013-11-03 | Discharge: 2013-11-03 | Disposition: A | Discharge status: Home / Self Care | Payer: 59 | Source: Ambulatory Visit | Attending: Internal Medicine | Admitting: Internal Medicine

## 2013-11-03 ENCOUNTER — Ambulatory Visit (INDEPENDENT_AMBULATORY_CARE_PROVIDER_SITE_OTHER): Payer: 59 | Admitting: Internal Medicine

## 2013-11-03 ENCOUNTER — Encounter: Payer: Self-pay | Admitting: Internal Medicine

## 2013-11-03 VITALS — BP 100/70 | HR 85 | Temp 98.0°F | Resp 12 | Wt 133.4 lb

## 2013-11-03 DIAGNOSIS — J328 Other chronic sinusitis: Secondary | ICD-10-CM

## 2013-11-03 DIAGNOSIS — S93409A Sprain of unspecified ligament of unspecified ankle, initial encounter: Secondary | ICD-10-CM | POA: Diagnosis not present

## 2013-11-03 DIAGNOSIS — S96912A Strain of unspecified muscle and tendon at ankle and foot level, left foot, initial encounter: Secondary | ICD-10-CM

## 2013-11-03 MED ORDER — CEFUROXIME AXETIL 250 MG PO TABS: 250.0000 mg | ORAL_TABLET | Freq: Two times a day (BID) | ORAL | Status: DC

## 2013-11-03 NOTE — Patient Instructions (Signed)
Your next office appointment will be determined based upon review of your pending  x-rays. Those instructions will be transmitted to you through My Chart   Use an anti-inflammatory cream such as Aspercreme or Zostrix cream twice a day to the affected area as needed. In lieu of this warm moist compresses or  hot water bottle can be used. Do not apply ice . Followup as needed for your ankle/foot pain with  Dr Charlann Boxer if no better.

## 2013-11-03 NOTE — Progress Notes (Signed)
   Subjective:    Patient ID: Lorraine Weber, female    DOB: Jan 08, 1981, 33 y.o.   MRN: [id number]  HPI  Symptoms began 10/27/13 as sinus pressure in both the frontal and maxillary sinus areas, left greater than right. She also had rhinitis and postnasal drainage. Secretions were green/ yellow and purulent in character. She continues to have head congestion with altered sense of smell and taste.  She subjectively describes being hot and cold without documented fever  She has pain in the left ear as well as sore throat.  The symptoms persist despite nasal cleansing, Nettie pot, and inhaled nasal steroid    Review of Systems   She denies any cough or extrinsic symptoms.  Her migraines have exacerbate somewhat with the symptoms  She also was involved a  mechanical fall 2 weeks ago with a strain of the left ankle. There's been associated swelling and pain. Initially she was unable to bear weight.         Objective:   Physical Exam    Positive or pertinent findings include: There is increased cerumen in the left canal obscuring the left tympanic membrane. She has hyponasal speech. She has scattered facial scarring related to previous acne. The L foot is cool with decreased posterior tibial pulse on the left. There is suggestion of some Raynaud's with plethora of the toes of the left foot. She has pain with range of motion of the left ankle. She also has pain to compression of the dorsum & the medial / lateral aspect of the left foot. Subjectively the pain extends superiorly above the left lateral malleolus.  General appearance:thin but adequately nourished; no acute distress or increased work of breathing is present.  No  lymphadenopathy about the head, neck, or axilla noted.   Eyes: No conjunctival inflammation or lid edema is present. There is no scleral icterus.  Ears:  External ear exam shows no significant lesions or deformities. R TM WNL.  Nose:  External nasal examination  shows no deformity or inflammation. Nasal mucosa are dry  without lesions or exudates. No septal dislocation or deviation.No obstruction to airflow.   Oral exam: Dental hygiene is good; lips and gums are healthy appearing.There is no oropharyngeal erythema or exudate noted.   Neck:  No deformities, thyromegaly, masses, or tenderness noted.   Supple with full range of motion without pain.   Heart:  Normal rate and regular rhythm. S1 and S2 normal without gallop, murmur, click, rub or other extra sounds.   Lungs:Chest clear to auscultation; no wheezes, rhonchi,rales ,or rubs present.No increased work of breathing.    Extremities:  No cyanosis, edema, or clubbing  noted    Skin: Warm & dry w/o jaundice or tenting.         Assessment & Plan:  #1 rhinosinusitis without significant bronchitis #2 strain L ankle  Plan: Nasal hygiene interventions as being conducted. See prescription medications

## 2013-11-03 NOTE — Progress Notes (Signed)
Pre visit review using our clinic review tool, if applicable. No additional management support is needed unless otherwise documented below in the visit note. 

## 2013-11-25 DIAGNOSIS — Z6821 Body mass index (BMI) 21.0-21.9, adult: Secondary | ICD-10-CM | POA: Diagnosis not present

## 2013-11-25 DIAGNOSIS — R51 Headache: Secondary | ICD-10-CM | POA: Diagnosis not present

## 2013-12-16 ENCOUNTER — Other Ambulatory Visit: Payer: Self-pay | Admitting: Obstetrics and Gynecology

## 2013-12-16 DIAGNOSIS — Z01419 Encounter for gynecological examination (general) (routine) without abnormal findings: Secondary | ICD-10-CM | POA: Diagnosis not present

## 2013-12-16 DIAGNOSIS — Z124 Encounter for screening for malignant neoplasm of cervix: Secondary | ICD-10-CM | POA: Diagnosis not present

## 2013-12-17 LAB — CYTOLOGY - PAP

## 2013-12-23 DIAGNOSIS — I959 Hypotension, unspecified: Secondary | ICD-10-CM | POA: Diagnosis not present

## 2013-12-23 DIAGNOSIS — R51 Headache: Secondary | ICD-10-CM | POA: Diagnosis not present

## 2014-01-26 ENCOUNTER — Encounter: Payer: Self-pay | Admitting: Internal Medicine

## 2014-01-26 ENCOUNTER — Ambulatory Visit (INDEPENDENT_AMBULATORY_CARE_PROVIDER_SITE_OTHER): Payer: 59 | Admitting: Internal Medicine

## 2014-01-26 VITALS — BP 132/80 | HR 97 | Temp 98.1°F | Wt 135.2 lb

## 2014-01-26 DIAGNOSIS — G44321 Chronic post-traumatic headache, intractable: Secondary | ICD-10-CM | POA: Diagnosis not present

## 2014-01-26 DIAGNOSIS — H6982 Other specified disorders of Eustachian tube, left ear: Secondary | ICD-10-CM

## 2014-01-26 MED ORDER — PROMETHAZINE HCL 12.5 MG PO TABS: 12.5000 mg | ORAL_TABLET | Freq: Four times a day (QID) | ORAL | Status: DC | PRN

## 2014-01-26 NOTE — Patient Instructions (Signed)
Go to [facility] for eustachian tube dysfunction. Drink thin  fluids liberally through the day . Do the Valsalva maneuver several times a day to "pop" ears open. Flonase  OR Nasocort AQ1 spray in each nostril twice a day as needed. Use the "crossover" technique as discussed.    I recommend an Ophthalmology consultation to determine optimal therapy; please inform me if you a referral

## 2014-01-26 NOTE — Progress Notes (Signed)
Pre visit review using our clinic review tool, if applicable. No additional management support is needed unless otherwise documented below in the visit note. 

## 2014-01-26 NOTE — Progress Notes (Signed)
   Subjective:    Patient ID: Lorraine Weber, female    DOB: 1980/10/01, 33 y.o.   MRN: [id number]  HPI She has had over 12 spinal cord blood patching procedures which do provide temporary relief of her headaches & nausea. Unfortunately she seems to reject the patch and the headaches and nausea return. The headaches and nausea are actually progressing.  A possible laminotomy procedure for a more permanent solution has been discussed but never scheduled.  She also has had recent trauma from running into a holly bush to the left forehead. Subsequently she has had some pressure in the left ear and temporal area.  She has no upper respiratory tract symptoms with this.  She does have history of chronic visual field cut related to the remote head trauma which was the original cause of her symptoms.   Review of Systems  Frontal headache, facial pain , nasal purulence, dental pain, sore throat , otic discharge denied. No fever , chills or sweats.     Objective:   Physical Exam  Positive or pertinent findings include: She has ptosis bilaterally, greater on the left than the right. There is a demonstrable she'll field cut on the left to direct testing The left tympanic membrane is dull with decreased light reflex. There is no evidence of inflammation.  General appearance:thin but in good health ;adequately nourished; no acute distress or increased work of breathing is present.  No  lymphadenopathy about the head, neck, or axilla noted.   Eyes: No conjunctival inflammation or lid edema is present. There is no scleral icterus.  Ears:  External ear exam shows no significant lesions or deformities.  Otoscopic examination reveals clear canals, tympanic membranes are intact bilaterally without bulging, retraction, inflammation or discharge.  Nose:  External nasal examination shows no deformity or inflammation. Nasal mucosa are dry without lesions or exudates. No septal dislocation or deviation.No  obstruction to airflow.   Oral exam: Dental hygiene is good; lips and gums are healthy appearing.There is no oropharyngeal erythema or exudate noted.   Neck:  No deformities, thyromegaly, masses, or tenderness noted.     Heart:  Normal rate and regular rhythm. S1 and S2 normal without gallop, murmur, click, rub or other extra sounds.   Lungs:Chest clear to auscultation; no wheezes, rhonchi,rales ,or rubs present.No increased work of breathing.    Extremities:  No cyanosis, edema, or clubbing  noted    Skin: Warm & dry w/o jaundice or tenting.         Assessment & Plan:  #1 eustachian tube dysfunction  #2 headaches and nausea, progressive in the context of some spinal fluid leak from previous head trauma.  Plan: as per protocol in AVS  I recommend they discuss possible referral with her specialist at [facility] for the definitive surgical procedure

## 2014-01-27 ENCOUNTER — Telehealth: Payer: Self-pay | Admitting: Internal Medicine

## 2014-01-27 NOTE — Telephone Encounter (Signed)
emmi emailed °

## 2014-02-06 ENCOUNTER — Telehealth: Payer: 59 | Admitting: Family

## 2014-02-06 DIAGNOSIS — J019 Acute sinusitis, unspecified: Secondary | ICD-10-CM

## 2014-02-06 MED ORDER — AMOXICILLIN-POT CLAVULANATE 875-125 MG PO TABS: 1.0000 | ORAL_TABLET | Freq: Two times a day (BID) | ORAL | Status: DC

## 2014-02-06 NOTE — Progress Notes (Signed)

## 2014-03-02 DIAGNOSIS — R110 Nausea: Secondary | ICD-10-CM | POA: Diagnosis not present

## 2014-03-02 DIAGNOSIS — R51 Headache: Secondary | ICD-10-CM | POA: Diagnosis not present

## 2014-03-02 DIAGNOSIS — M5124 Other intervertebral disc displacement, thoracic region: Secondary | ICD-10-CM | POA: Diagnosis not present

## 2014-03-02 DIAGNOSIS — I959 Hypotension, unspecified: Secondary | ICD-10-CM | POA: Diagnosis not present

## 2014-03-14 ENCOUNTER — Emergency Department (HOSPITAL_COMMUNITY)
Admission: EM | Admit: 2014-03-14 | Discharge: 2014-03-15 | Disposition: A | Discharge status: Home / Self Care | Payer: 59 | Attending: Emergency Medicine | Admitting: Emergency Medicine

## 2014-03-14 ENCOUNTER — Encounter (HOSPITAL_COMMUNITY): Payer: Self-pay | Admitting: *Deleted

## 2014-03-14 ENCOUNTER — Emergency Department (HOSPITAL_COMMUNITY): Payer: 59

## 2014-03-14 DIAGNOSIS — R51 Headache: Secondary | ICD-10-CM

## 2014-03-14 DIAGNOSIS — Z7951 Long term (current) use of inhaled steroids: Secondary | ICD-10-CM | POA: Insufficient documentation

## 2014-03-14 DIAGNOSIS — Z87442 Personal history of urinary calculi: Secondary | ICD-10-CM | POA: Diagnosis not present

## 2014-03-14 DIAGNOSIS — Z79899 Other long term (current) drug therapy: Secondary | ICD-10-CM | POA: Insufficient documentation

## 2014-03-14 DIAGNOSIS — Z862 Personal history of diseases of the blood and blood-forming organs and certain disorders involving the immune mechanism: Secondary | ICD-10-CM | POA: Diagnosis not present

## 2014-03-14 DIAGNOSIS — G43909 Migraine, unspecified, not intractable, without status migrainosus: Secondary | ICD-10-CM | POA: Diagnosis not present

## 2014-03-14 DIAGNOSIS — M542 Cervicalgia: Secondary | ICD-10-CM | POA: Diagnosis not present

## 2014-03-14 DIAGNOSIS — Z793 Long term (current) use of hormonal contraceptives: Secondary | ICD-10-CM | POA: Insufficient documentation

## 2014-03-14 DIAGNOSIS — Z87828 Personal history of other (healed) physical injury and trauma: Secondary | ICD-10-CM | POA: Insufficient documentation

## 2014-03-14 DIAGNOSIS — Z720 Tobacco use: Secondary | ICD-10-CM | POA: Diagnosis not present

## 2014-03-14 DIAGNOSIS — K279 Peptic ulcer, site unspecified, unspecified as acute or chronic, without hemorrhage or perforation: Secondary | ICD-10-CM | POA: Diagnosis not present

## 2014-03-14 DIAGNOSIS — R519 Headache, unspecified: Secondary | ICD-10-CM

## 2014-03-14 HISTORY — DX: Cerebrospinal fluid leak: G96.0

## 2014-03-14 HISTORY — DX: Cerebrospinal fluid leak, unspecified: G96.00

## 2014-03-14 MED ORDER — ONDANSETRON HCL 4 MG/2ML IJ SOLN
4.0000 mg | Freq: Once | INTRAMUSCULAR | Status: AC
  Administered 2014-03-14: 4 mg via INTRAVENOUS
  Filled 2014-03-14: qty 2

## 2014-03-14 MED ORDER — HYDROMORPHONE HCL 1 MG/ML IJ SOLN
1.0000 mg | Freq: Once | INTRAMUSCULAR | Status: AC
  Administered 2014-03-14: 1 mg via INTRAVENOUS
  Filled 2014-03-14: qty 1

## 2014-03-14 NOTE — ED Notes (Addendum)
Sudden onset loss of vision  For few min , then headache  Nausea, vomiting.  Had blood patch 2 weeks ago at [facility] for csf leak.

## 2014-03-14 NOTE — ED Provider Notes (Signed)
CSN: [id number]     Arrival date & time 03/14/14  2249 History   This chart was scribed for Maudry Diego, MD by Chester Holstein, ED Scribe. This patient was seen in room APA03/APA03 and the patient's care was started at 11:19 PM.    Chief Complaint  Patient presents with  . Headache    Patient is a 34 y.o. female presenting with headaches. The history is provided by the patient.  Headache Associated symptoms: nausea, neck pain and vomiting (once)   Associated symptoms: no abdominal pain, no back pain, no congestion, no cough, no diarrhea, no fatigue, no seizures and no sinus pressure    HPI Comments: Lorraine Weber is a 34 y.o. female with PMHx of CSF leak, migraines, mastoiditis, nephrolithiasis who presents to the Emergency Department complaining of headache with sudden onset. Pt notes symptoms started with associated visual disturbances all day, vision loss at onset and progressing to neck pain, vomiting once, and nausea. Pt had blood patch done at [facility] 2 weeks ago Pt is allergic to Levaquin, blood thinners, and Topamax. Her vision has returned to normal  Past Medical History  Diagnosis Date  . Migraines   . Von Willebrand disease   . Anemia   . Peptic ulcer disease   . Concussion     x8, last 2010, residual homonymous Hemianopsia  . Migraines   . Mastoiditis   . Nephrolithiasis     X5, Dr Jeffie Pollock  . CSF leak    Past Surgical History  Procedure Laterality Date  . Appendectomy    . Hand tendon surgery      left, complicated by MRSA   Family History  Problem Relation Age of Onset  . Arthritis Father   . Hyperlipidemia Father   . Diabetes Father   . Stroke Father     PTE post CVA  . Prostate cancer Paternal Grandfather   . Hyperlipidemia Paternal Grandfather   . Diabetes Paternal Grandfather   . Heart disease Mother   . Heart disease Maternal Grandmother   . Diabetes Maternal Grandmother   . Heart disease Maternal Grandfather   . Heart disease Maternal Aunt    . Stroke Maternal Aunt   . Heart disease Maternal Uncle   . Heart disease Brother    History  Substance Use Topics  . Smoking status: Current Some Day Smoker -- 0.30 packs/day for 10 years    Types: Cigarettes  . Smokeless tobacco: Never Used     Comment: 1 cig daily, trying to quit   . Alcohol Use: No   OB History    No data available     Review of Systems  Constitutional: Negative for appetite change and fatigue.  HENT: Negative for congestion, ear discharge and sinus pressure.   Eyes: Positive for visual disturbance. Negative for discharge.  Respiratory: Negative for cough.   Cardiovascular: Negative for chest pain.  Gastrointestinal: Positive for nausea and vomiting (once). Negative for abdominal pain and diarrhea.  Genitourinary: Negative for frequency and hematuria.  Musculoskeletal: Positive for neck pain. Negative for back pain.  Skin: Negative for rash.  Neurological: Positive for headaches. Negative for seizures.  Psychiatric/Behavioral: Negative for hallucinations.      Allergies  Aspirin; Levofloxacin; Other; and Topamax  Home Medications   Prior to Admission medications   Medication Sig Start Date End Date Taking? Authorizing Provider  amoxicillin-clavulanate (AUGMENTIN) 875-125 MG per tablet Take 1 tablet by mouth 2 (two) times daily. 02/06/14   Christy A  Hawks, FNP  butalbital-acetaminophen-caffeine (ESGIC) 50-325-40 MG per tablet Take 1 tablet by mouth as needed for migraine.     Historical Provider, MD  diltiazem (DILACOR XR) 120 MG 24 hr capsule Take 120 mg by mouth daily.    Historical Provider, MD  fluticasone (FLONASE) 50 MCG/ACT nasal spray Place 1 spray into both nostrils daily.    Historical Provider, MD  levonorgestrel (MIRENA) 20 MCG/24HR IUD 1 each by Intrauterine route once.    Historical Provider, MD  losartan-hydrochlorothiazide (HYZAAR) 100-25 MG per tablet Take 0.5 tablets by mouth daily. 1/2 qd 04/23/13   Hendricks Limes, MD  omeprazole  (PRILOSEC) 20 MG capsule Take 20 mg by mouth daily.    Historical Provider, MD  promethazine (PHENERGAN) 12.5 MG tablet Take 1 tablet (12.5 mg total) by mouth every 6 (six) hours as needed for nausea or vomiting. 01/26/14   Hendricks Limes, MD   BP 134/65 mmHg  Pulse 84  Temp(Src) 97.6 F (36.4 C) (Oral)  Resp 20  Ht 5\' 5"  (1.651 m)  Wt 130 lb (58.968 kg)  BMI 21.63 kg/m2  SpO2 100%  LMP 02/21/2014 Physical Exam  Constitutional: She is oriented to person, place, and time. She appears well-developed.  HENT:  Head: Normocephalic.  Eyes: Conjunctivae and EOM are normal. Pupils are equal, round, and reactive to light. No scleral icterus.  Neck: Neck supple. No thyromegaly present.  Cardiovascular: Normal rate and regular rhythm.  Exam reveals no gallop and no friction rub.   No murmur heard. Pulmonary/Chest: No stridor. She has no wheezes. She has no rales. She exhibits no tenderness.  Abdominal: She exhibits no distension. There is no tenderness. There is no rebound.  Musculoskeletal: Normal range of motion. She exhibits no edema.  Lymphadenopathy:    She has no cervical adenopathy.  Neurological: She is oriented to person, place, and time. No cranial nerve deficit. She exhibits normal muscle tone. Coordination normal.  Skin: No rash noted. No erythema.  Psychiatric: She has a normal mood and affect. Her behavior is normal.    ED Course  Procedures (including critical care time) DIAGNOSTIC STUDIES: Oxygen Saturation is 100% on room air, normal by my interpretation.    COORDINATION OF CARE: 11:23 PM Discussed treatment plan with patient at beside, the patient agrees with the plan and has no further questions at this time.   Labs Review Labs Reviewed - No data to display  Imaging Review No results found.   EKG Interpretation None      MDM   Final diagnoses:  None    Headache improved with tx.  Nl  Ct.  Pt to follow up with her md    The chart was scribed for me  under my direct supervision.  I personally performed the history, physical, and medical decision making and all procedures in the evaluation of this patient.Maudry Diego, MD 03/15/14 [phone]

## 2014-03-15 MED ORDER — HYDROCODONE-ACETAMINOPHEN 5-325 MG PO TABS: 1.0000 | ORAL_TABLET | Freq: Four times a day (QID) | ORAL | Status: DC | PRN

## 2014-03-15 MED ORDER — ONDANSETRON 4 MG PO TBDP
ORAL_TABLET | ORAL | Status: DC
Start: 2014-03-15 — End: 2014-05-09

## 2014-03-15 NOTE — Discharge Instructions (Signed)
Call your md tomorrow and let them know what happened and how you are doing

## 2014-03-21 DIAGNOSIS — R51 Headache: Secondary | ICD-10-CM | POA: Diagnosis not present

## 2014-03-21 DIAGNOSIS — H538 Other visual disturbances: Secondary | ICD-10-CM | POA: Diagnosis not present

## 2014-03-21 DIAGNOSIS — H547 Unspecified visual loss: Secondary | ICD-10-CM | POA: Diagnosis not present

## 2014-05-09 ENCOUNTER — Encounter: Payer: Self-pay | Admitting: Internal Medicine

## 2014-05-09 ENCOUNTER — Ambulatory Visit (INDEPENDENT_AMBULATORY_CARE_PROVIDER_SITE_OTHER): Payer: 59 | Admitting: Internal Medicine

## 2014-05-09 VITALS — BP 106/86 | HR 73 | Temp 97.8°F | Resp 16 | Ht 65.0 in | Wt 135.5 lb

## 2014-05-09 DIAGNOSIS — J0100 Acute maxillary sinusitis, unspecified: Secondary | ICD-10-CM

## 2014-05-09 DIAGNOSIS — R110 Nausea: Secondary | ICD-10-CM

## 2014-05-09 MED ORDER — AMOXICILLIN 500 MG PO CAPS: 500.0000 mg | ORAL_CAPSULE | Freq: Three times a day (TID) | ORAL | Status: DC

## 2014-05-09 MED ORDER — PROMETHAZINE HCL 12.5 MG PO TABS: 12.5000 mg | ORAL_TABLET | Freq: Four times a day (QID) | ORAL | Status: DC | PRN

## 2014-05-09 NOTE — Patient Instructions (Signed)
Please continue the nasal hygiene protocol. Plain Mucinex (NOT D) for thick secretions ;force NON dairy fluids .   Nasal cleansing in the shower as discussed with lather of mild shampoo.After 10 seconds wash off lather while  exhaling through nostrils. Make sure that all residual soap is removed to prevent irritation.  Flonase OR Nasacort AQ 1 spray in each nostril twice a day as needed. Use the "crossover" technique into opposite nostril spraying toward opposite ear @ 45 degree angle, not straight up into nostril.  Plain Allegra (NOT D )  160 daily , Loratidine 10 mg , OR Zyrtec 10 mg @ bedtime  as needed for itchy eyes & sneezing.   Please do not use Q-tips as this simply packs the wax down against he eardrum. Should wax build up occur, please put 2-3 drops of mineral oil in the ear at night and cover the canal with a  cotton ball.In the morning fill the canal with hydrogen peroxide & leave  for 10-15 minutes.Following this shower and use the thinnest washrag available to wick out the wax.

## 2014-05-09 NOTE — Progress Notes (Signed)
Pre visit review using our clinic review tool, if applicable. No additional management support is needed unless otherwise documented below in the visit note. 

## 2014-05-09 NOTE — Progress Notes (Signed)
   Subjective:    Patient ID: Lorraine Weber, female    DOB: 16-May-1980, 34 y.o.   MRN: [id number]  HPI Her symptoms began 05/05/14 a sore throat. This was associated with nausea vomiting and diarrhea. The vomiting & diarrhea resolved as of 3/26. She's had residual nausea which she attributes to postnasal drainage.  Active symptoms include head and chest congestion. She has nasal secretions which are green/yellow. Temperature has been as high as 100 & associated with chills and sweats. She has pain in the left maxillary teeth as well as in the frontal and maxillary sinus areas. She's also had slight epistaxis.   Review of Systems She denies loss of smell or taste. She has no other bleeding dyscrasias such as hemoptysis, hematuria, melena, rectal bleeding. She has no abnormal bruising or bleeding. She has no difficulty stopping bleeding. She denies any abnormal bruising.    Objective:   Physical Exam Pertinent positive findings include: She has hyponasal speech pattern.  There is increased wax in both canals.  Tympanic membranes cannot be visualized.  There is marked erythema of the nasal mucosa bilaterally.  General appearance:Adequately nourished; no acute distress or increased work of breathing is present.   Lymphatic: No  lymphadenopathy about the head, neck, or axilla . Eyes: No conjunctival inflammation or lid edema is present. There is no scleral icterus. Ears:  External ear exam shows no significant lesions or deformities.   Nose:  External nasal examination shows no deformity or inflammation. No septal dislocation or deviation.No obstruction to airflow.  Oral exam: Dental hygiene is good; lips and gums are healthy appearing.There is no oropharyngeal erythema or exudate . Neck:  No deformities, thyromegaly, masses, or tenderness noted.   Supple with full range of motion without pain.  Heart:  Normal rate and regular rhythm. S1 and S2 normal without gallop, murmur, click, rub or other  extra sounds.  Lungs:Chest clear to auscultation; no wheezes, rhonchi,rales ,or rubs present. Extremities:  No cyanosis, edema, or clubbing  noted  Skin: Warm & dry w/o tenting or jaundice. No significant lesions or rash.       Assessment & Plan:  #1 acute frontal sinusitis  #2 cough secondary postnasal drainage  #3 nausea secondary to postnasal drainage  Plan: See orders and recommendations

## 2014-05-10 ENCOUNTER — Other Ambulatory Visit: Payer: Self-pay | Admitting: Internal Medicine

## 2014-05-10 DIAGNOSIS — Z01818 Encounter for other preprocedural examination: Secondary | ICD-10-CM

## 2014-05-10 DIAGNOSIS — G9600 Cerebrospinal fluid leak, unspecified: Secondary | ICD-10-CM

## 2014-05-10 DIAGNOSIS — G44329 Chronic post-traumatic headache, not intractable: Secondary | ICD-10-CM

## 2014-05-13 DIAGNOSIS — Z6822 Body mass index (BMI) 22.0-22.9, adult: Secondary | ICD-10-CM | POA: Diagnosis not present

## 2014-05-13 DIAGNOSIS — J0100 Acute maxillary sinusitis, unspecified: Secondary | ICD-10-CM | POA: Diagnosis not present

## 2014-05-23 ENCOUNTER — Other Ambulatory Visit (INDEPENDENT_AMBULATORY_CARE_PROVIDER_SITE_OTHER): Payer: Medicare Other

## 2014-05-23 DIAGNOSIS — Z01818 Encounter for other preprocedural examination: Secondary | ICD-10-CM

## 2014-05-23 DIAGNOSIS — G9600 Cerebrospinal fluid leak, unspecified: Secondary | ICD-10-CM

## 2014-05-23 LAB — URINALYSIS
Bilirubin Urine: NEGATIVE
Ketones, ur: NEGATIVE
Leukocytes, UA: NEGATIVE
Nitrite: NEGATIVE
Specific Gravity, Urine: 1.015 (ref 1.000–1.030)
Total Protein, Urine: NEGATIVE
Urine Glucose: NEGATIVE
Urobilinogen, UA: 0.2 (ref 0.0–1.0)
pH: 6 (ref 5.0–8.0)

## 2014-05-23 LAB — BASIC METABOLIC PANEL
BUN: 15 mg/dL (ref 6–23)
CO2: 28 mEq/L (ref 19–32)
Calcium: 9.2 mg/dL (ref 8.4–10.5)
Chloride: 103 mEq/L (ref 96–112)
Creatinine, Ser: 0.72 mg/dL (ref 0.40–1.20)
GFR: 97.44 mL/min (ref >60.00)
Glucose, Bld: 85 mg/dL (ref 70–99)
Potassium: 3.4 mEq/L — ABNORMAL LOW (ref 3.5–5.1)
Sodium: 136 mEq/L (ref 135–145)

## 2014-05-23 LAB — CBC WITH DIFFERENTIAL/PLATELET
Basophils Absolute: 0 10*3/uL (ref 0.0–0.1)
Basophils Relative: 0.3 % (ref 0.0–3.0)
Eosinophils Absolute: 0.1 10*3/uL (ref 0.0–0.7)
Eosinophils Relative: 1.4 % (ref 0.0–5.0)
HCT: 41 % (ref 36.0–46.0)
Hemoglobin: 13.9 g/dL (ref 12.0–15.0)
Lymphocytes Relative: 30.9 % (ref 12.0–46.0)
Lymphs Abs: 2.9 10*3/uL (ref 0.7–4.0)
MCHC: 34 g/dL (ref 30.0–36.0)
MCV: 85.5 fl (ref 78.0–100.0)
Monocytes Absolute: 0.7 10*3/uL (ref 0.1–1.0)
Monocytes Relative: 7.9 % (ref 3.0–12.0)
Neutro Abs: 5.6 10*3/uL (ref 1.4–7.7)
Neutrophils Relative %: 59.5 % (ref 43.0–77.0)
Platelets: 310 10*3/uL (ref 150.0–400.0)
RBC: 4.79 Mil/uL (ref 3.87–5.11)
RDW: 19.1 % — ABNORMAL HIGH (ref 11.5–15.5)
WBC: 9.4 10*3/uL (ref 4.0–10.5)

## 2014-05-23 LAB — PROTIME-INR
INR: 1 ratio (ref 0.8–1.0)
Prothrombin Time: 11.3 s (ref 9.6–13.1)

## 2014-05-23 LAB — APTT: aPTT: 29.1 s (ref 23.4–32.7)

## 2014-05-24 ENCOUNTER — Ambulatory Visit (INDEPENDENT_AMBULATORY_CARE_PROVIDER_SITE_OTHER)
Admission: RE | Admit: 2014-05-24 | Discharge: 2014-05-24 | Disposition: A | Discharge status: Home / Self Care | Payer: 59 | Source: Ambulatory Visit | Attending: Internal Medicine | Admitting: Internal Medicine

## 2014-05-24 ENCOUNTER — Ambulatory Visit (INDEPENDENT_AMBULATORY_CARE_PROVIDER_SITE_OTHER): Payer: 59 | Admitting: Internal Medicine

## 2014-05-24 ENCOUNTER — Encounter: Payer: Self-pay | Admitting: Internal Medicine

## 2014-05-24 VITALS — BP 128/82 | HR 86 | Temp 97.9°F | Resp 15 | Ht 65.0 in | Wt 135.0 lb

## 2014-05-24 DIAGNOSIS — Z01818 Encounter for other preprocedural examination: Secondary | ICD-10-CM

## 2014-05-24 DIAGNOSIS — G9600 Cerebrospinal fluid leak, unspecified: Secondary | ICD-10-CM

## 2014-05-24 DIAGNOSIS — I10 Essential (primary) hypertension: Secondary | ICD-10-CM | POA: Diagnosis not present

## 2014-05-24 DIAGNOSIS — D6800 Von Willebrand disease, unspecified: Secondary | ICD-10-CM

## 2014-05-24 DIAGNOSIS — R110 Nausea: Secondary | ICD-10-CM

## 2014-05-24 MED ORDER — PROMETHAZINE HCL 12.5 MG PO TABS: 12.5000 mg | ORAL_TABLET | Freq: Four times a day (QID) | ORAL | Status: DC | PRN

## 2014-05-24 NOTE — Progress Notes (Signed)
Subjective:    Patient ID: Lorraine Weber, female    DOB: 09-25-1980, 34 y.o.   MRN: [id number]  HPI She is scheduled for thoracic 9-11 laminectomy for cerebral spinal fluid leak repair. This has failed to respond to multiple serum patching procedures at [facility]. The CSF leak is in the context of multiple recurrent head trauma with concussions on at least 8 occasions.  She was placed on antihypertensive medication by a Neurologist at [facility]; this has been continued by the Neurologist at [facility]. She denies intake of excess salt. She's not monitoring blood pressure at home. No regular exercise; but she is physically active taking care of multiple pet dogs.. She has no associated cardiopulmonary symptoms.     Review of Systems Some nausea w/o other GI symptoms.  Chest pain, palpitations, tachycardia, exertional dyspnea, paroxysmal nocturnal dyspnea, claudication or edema are absent.  Despite PMH of Von Willebrand's disease;epistaxis, hemoptysis, hematuria, melena, or rectal bleeding denied. No unexplained weight loss, significant dyspepsia,dysphagia, or abdominal pain.  There is no abnormal bruising , bleeding, or difficulty stopping bleeding with injury.     Objective:   Physical Exam Gen.: Thin but adequately nourished in appearance. Alert, appropriate and cooperative throughout exam.  Appears younger than stated age  Head: Normocephalic without obvious abnormalities  Eyes: No corneal or conjunctival inflammation noted. Pupils equal round reactive to light.Decreased lateral rotation OS. Ptosis OS. Ears: External  ear exam reveals no significant lesions or deformities.Wax buildup  L canal > R. Hearing is grossly normal bilaterally. Nose: External nasal exam reveals no deformity or inflammation. Nasal mucosa are pink and moist. No lesions or exudates noted.   Mouth: Oral mucosa and oropharynx reveal no lesions or exudates. Teeth in good repair. Neck: No  deformities, masses, or tenderness noted. Range of motion & Thyroid normal. Lungs: Normal respiratory effort; chest expands symmetrically. Lungs are clear to auscultation without rales, wheezes, or increased work of breathing. Heart: Normal rate and rhythm. Normal S1 and S2. No gallop, click, or rub. No murmur. Abdomen: Bowel sounds normal; abdomen soft and nontender. No masses, organomegaly or hernias noted. Genitalia: as per Gyn.She has IUD.                             Musculoskeletal/extremities: No deformity or scoliosis noted of  the thoracic or lumbar spine.  No clubbing, cyanosis, edema, or significant extremity  deformity noted.  Range of motion normal . Tone & strength normal. Hand joints normal  Fingernail  health good. Minor crepitus of knees  Able to lie down & sit up w/o help.  Negative SLR bilaterally Vascular: Carotid, radial artery, dorsalis pedis and  posterior tibial pulses are full and equal. No bruits present. Neurologic: Alert and oriented x3. Deep tendon reflexes symmetrical and normal.  Gait normal        Skin: Intact without suspicious lesions or rashes.Several tatooes present Lymph: No cervical, axillary lymphadenopathy present. Psych: Mood and affect are normal. Normally interactive  Assessment & Plan:  #1 csf leak with secondary headache disorder #2 PMH of Von Willebrand's Disease See labs, EKG & CXray results

## 2014-05-24 NOTE — Patient Instructions (Signed)
Pending chest xray results you are medically cleared for surgery.

## 2014-05-24 NOTE — Progress Notes (Signed)
Pre visit review using our clinic review tool, if applicable. No additional management support is needed unless otherwise documented below in the visit note. 

## 2014-05-25 ENCOUNTER — Telehealth: Payer: Self-pay

## 2014-05-25 NOTE — Telephone Encounter (Signed)
Called patient to notify her I faxed all surgical information. Need to know if she wants to pick up her packet of info to take with her. Waiting for her return call.

## 2014-05-31 DIAGNOSIS — G588 Other specified mononeuropathies: Secondary | ICD-10-CM | POA: Diagnosis not present

## 2014-05-31 DIAGNOSIS — G960 Cerebrospinal fluid leak: Secondary | ICD-10-CM | POA: Diagnosis not present

## 2014-05-31 DIAGNOSIS — G930 Cerebral cysts: Secondary | ICD-10-CM | POA: Diagnosis not present

## 2014-05-31 DIAGNOSIS — M5124 Other intervertebral disc displacement, thoracic region: Secondary | ICD-10-CM | POA: Diagnosis not present

## 2014-06-01 DIAGNOSIS — M47892 Other spondylosis, cervical region: Secondary | ICD-10-CM | POA: Diagnosis not present

## 2014-06-01 DIAGNOSIS — N200 Calculus of kidney: Secondary | ICD-10-CM | POA: Diagnosis not present

## 2014-06-01 DIAGNOSIS — M4185 Other forms of scoliosis, thoracolumbar region: Secondary | ICD-10-CM | POA: Diagnosis not present

## 2014-06-01 DIAGNOSIS — G960 Cerebrospinal fluid leak: Secondary | ICD-10-CM | POA: Diagnosis not present

## 2014-06-01 DIAGNOSIS — Z79899 Other long term (current) drug therapy: Secondary | ICD-10-CM | POA: Diagnosis not present

## 2014-06-01 DIAGNOSIS — I10 Essential (primary) hypertension: Secondary | ICD-10-CM | POA: Diagnosis not present

## 2014-06-14 ENCOUNTER — Emergency Department (HOSPITAL_COMMUNITY)
Admission: EM | Admit: 2014-06-14 | Discharge: 2014-06-14 | Disposition: A | Discharge status: Home / Self Care | Payer: 59 | Attending: Emergency Medicine | Admitting: Emergency Medicine

## 2014-06-14 ENCOUNTER — Encounter (HOSPITAL_COMMUNITY): Payer: Self-pay | Admitting: *Deleted

## 2014-06-14 DIAGNOSIS — Z8669 Personal history of other diseases of the nervous system and sense organs: Secondary | ICD-10-CM | POA: Insufficient documentation

## 2014-06-14 DIAGNOSIS — Z7951 Long term (current) use of inhaled steroids: Secondary | ICD-10-CM | POA: Diagnosis not present

## 2014-06-14 DIAGNOSIS — Z87828 Personal history of other (healed) physical injury and trauma: Secondary | ICD-10-CM | POA: Diagnosis not present

## 2014-06-14 DIAGNOSIS — Z79899 Other long term (current) drug therapy: Secondary | ICD-10-CM | POA: Diagnosis not present

## 2014-06-14 DIAGNOSIS — Z5189 Encounter for other specified aftercare: Secondary | ICD-10-CM

## 2014-06-14 DIAGNOSIS — Z8719 Personal history of other diseases of the digestive system: Secondary | ICD-10-CM | POA: Diagnosis not present

## 2014-06-14 DIAGNOSIS — R51 Headache: Secondary | ICD-10-CM | POA: Diagnosis not present

## 2014-06-14 DIAGNOSIS — Z4801 Encounter for change or removal of surgical wound dressing: Secondary | ICD-10-CM | POA: Insufficient documentation

## 2014-06-14 DIAGNOSIS — Z720 Tobacco use: Secondary | ICD-10-CM | POA: Insufficient documentation

## 2014-06-14 DIAGNOSIS — Z8679 Personal history of other diseases of the circulatory system: Secondary | ICD-10-CM | POA: Diagnosis not present

## 2014-06-14 DIAGNOSIS — Z87442 Personal history of urinary calculi: Secondary | ICD-10-CM | POA: Diagnosis not present

## 2014-06-14 DIAGNOSIS — Z862 Personal history of diseases of the blood and blood-forming organs and certain disorders involving the immune mechanism: Secondary | ICD-10-CM | POA: Insufficient documentation

## 2014-06-14 NOTE — Discharge Instructions (Signed)
May gently cleanse the wound with soap and water tomorrow. Please do not get the wound wet tonight. Please allow the Steri-Strips to come off on their own. Please see your primary physician or return to the emergency department if any problems, or signs of infection. Wound Check Your wound appears healthy today. Your wound will heal gradually over time. Eventually a scar will form that will fade with time. FACTORS THAT AFFECT SCAR FORMATION:  People differ in the severity in which they scar.  Scar severity varies according to location, size, and the traits you inherited from your parents (genetic predisposition).  Irritation to the wound from infection, rubbing, or chemical exposure will increase the amount of scar formation. HOME CARE INSTRUCTIONS   If you were given a dressing, you should change it at least once a day or as instructed by your caregiver. If the bandage sticks, soak it off with a solution of hydrogen peroxide.  If the bandage becomes wet, dirty, or develops a bad smell, change it as soon as possible.  Look for signs of infection.  Only take over-the-counter or prescription medicines for pain, discomfort, or fever as directed by your caregiver. SEEK IMMEDIATE MEDICAL CARE IF:   You have redness, swelling, or increasing pain in the wound.  You notice pus coming from the wound.  You have a fever.  You notice a bad smell coming from the wound or dressing. Document Released: 11/04/2003 Document Revised: 04/22/2011 Document Reviewed: 01/28/2005 [facility] Patient Information 2015 [place], [place]. This information is not intended to replace advice given to you by your health care provider. Make sure you discuss any questions you have with your health care provider.

## 2014-06-14 NOTE — ED Provider Notes (Signed)
CSN: [id number]     Arrival date & time 06/14/14  1905 History   First MD Initiated Contact with Patient 06/14/14 2053     Chief Complaint  Patient presents with  . Wound Check     (Consider location/radiation/quality/duration/timing/severity/associated sxs/prior Treatment) HPI Comments: Patient is a 34 year old female who presents to the emergency department for a wound check of the mid back area.  The patient states that about 10 days ago she had thoracic back surgery at the [facility] in [place]. She has recently returned, and now notices redness around the surgical site. She spoke with her physicians at [facility], and they told her to come to the emergency department to have this evaluated area they were particularly concerned for possible infection. The patient denies any recent high fever. She has noticed some mild oozing of fluid material on the dressing. No frank pus like material.  Patient is a 34 y.o. female presenting with wound check. The history is provided by the patient and the spouse.  Wound Check Associated symptoms include headaches. Pertinent negatives include no fatigue or fever.    Past Medical History  Diagnosis Date  . Migraines   . Von Willebrand disease   . Anemia   . Peptic ulcer disease   . Concussion     x8, last 2010, residual homonymous Hemianopsia  . Migraines   . Mastoiditis   . Nephrolithiasis     X5, Dr Jeffie Pollock  . CSF leak     multiple serum pathes   Past Surgical History  Procedure Laterality Date  . Appendectomy    . Hand tendon surgery      left, complicated by MRSA  . Spinal fluid leak patching      > 10X;[facility]   Family History  Problem Relation Age of Onset  . Arthritis Father   . Hyperlipidemia Father   . Diabetes Father   . Stroke Father     PTE post CVA  . Prostate cancer Paternal Grandfather   . Hyperlipidemia Paternal Grandfather   . Diabetes Paternal Grandfather   . Heart disease Mother     ?hypertrophic  idiopathic subaortic stenosis  . Heart disease Maternal Grandmother   . Diabetes Maternal Grandmother   . Heart disease Maternal Grandfather     HISS  . Heart disease Maternal Aunt     HISS  . Stroke Maternal Aunt 50  . Heart disease Maternal Uncle   . Heart disease Brother     HISS   History  Substance Use Topics  . Smoking status: Current Some Day Smoker -- 0.30 packs/day for 10 years    Types: Cigarettes  . Smokeless tobacco: Never Used     Comment: smoked age 10-present, up to 1 ppd.05/24/14 1/2 ppd  . Alcohol Use: No   OB History    No data available     Review of Systems  Constitutional: Negative for fever, activity change, appetite change and fatigue.  Skin: Positive for wound.  Neurological: Positive for headaches.  All other systems reviewed and are negative.     Allergies  Aspirin; Levofloxacin; Other; and Topamax  Home Medications   Prior to Admission medications   Medication Sig Start Date End Date Taking? Authorizing Provider  butalbital-acetaminophen-caffeine (ESGIC) 50-325-40 MG per tablet Take 1 tablet by mouth as needed for migraine.     Historical Provider, MD  diltiazem (CARDIZEM CD) 120 MG 24 hr capsule Take 1 capsule by mouth daily. 05/24/14   Historical Provider,  MD  diltiazem (DILACOR XR) 120 MG 24 hr capsule Take 120 mg by mouth daily.    Historical Provider, MD  fluticasone (FLONASE) 50 MCG/ACT nasal spray Place 1 spray into both nostrils daily.    Historical Provider, MD  HYDROcodone-acetaminophen (NORCO) 10-325 MG per tablet Take 1 tablet by mouth every 4 (four) hours as needed. 06/10/14   Historical Provider, MD  levonorgestrel (MIRENA) 20 MCG/24HR IUD 1 each by Intrauterine route once.    Historical Provider, MD  losartan-hydrochlorothiazide (HYZAAR) 100-25 MG per tablet Take 0.5 tablets by mouth daily. 1/2 qd 04/23/13   Hendricks Limes, MD  omeprazole (PRILOSEC) 20 MG capsule Take 20 mg by mouth daily.    Historical Provider, MD  promethazine  (PHENERGAN) 12.5 MG tablet Take 1 tablet (12.5 mg total) by mouth every 6 (six) hours as needed for nausea or vomiting. 05/24/14   Hendricks Limes, MD   BP 133/65 mmHg  Pulse 78  Temp(Src) 98.1 F (36.7 C) (Oral)  Resp 20  Ht 5\' 5"  (1.651 m)  Wt 135 lb (61.236 kg)  BMI 22.47 kg/m2  SpO2 99% Physical Exam  Constitutional: She is oriented to person, place, and time. She appears well-developed and well-nourished.  Non-toxic appearance.  HENT:  Head: Normocephalic.  Right Ear: Tympanic membrane and external ear normal.  Left Ear: Tympanic membrane and external ear normal.  Eyes: EOM and lids are normal. Pupils are equal, round, and reactive to light.  Neck: Normal range of motion. Neck supple. Carotid bruit is not present.  Cardiovascular: Normal rate, regular rhythm, normal heart sounds, intact distal pulses and normal pulses.   Pulmonary/Chest: Breath sounds normal. No respiratory distress.  Abdominal: Soft. Bowel sounds are normal. There is no tenderness. There is no guarding.  Musculoskeletal: Normal range of motion.  There is a healing surgical site at the mid thoracic area. Steri-Strips are in place. Some have obviously fallen off. There is evidence of mild oozing at the upper portion of the surgical site, and at the lower portion. The area is not hot. There no red streaks appreciated. There is no active drainage at this time.  Lymphadenopathy:       Head (right side): No submandibular adenopathy present.       Head (left side): No submandibular adenopathy present.    She has no cervical adenopathy.  Neurological: She is alert and oriented to person, place, and time. She has normal strength. No cranial nerve deficit or sensory deficit.  Skin: Skin is warm and dry.  Psychiatric: She has a normal mood and affect. Her speech is normal.  Nursing note and vitals reviewed.   ED Course  The wound was cleansed with safe cleanse. 2 of the loops Steri-Strips were removed. Steri-Strips  were then replaced at the areas where the Steri-Strips come off, as well as over the areas where there were loose Steri-Strip noted. The wound was reinspected, there is no red streaking, no hot areas, no signs of advancing infection. Sterile dressing was applied by me.   Procedures (including critical care time) Labs Review Labs Reviewed - No data to display  Imaging Review No results found.   EKG Interpretation None      MDM  Vital signs are nonacute. There is no evidence of advancing infection at the surgical site of the thoracic area. The patient is ambulatory with her walker without significant problem. The wound was cleansed and redressed. The patient is to follow with her primary physician, she is to  return to the emergency department if any changes, or signs of advancing infection.    Final diagnoses:  None    **I have reviewed nursing notes, vital signs, and all appropriate lab and imaging results for this patient.Lily Kocher, PA-C 06/14/14 2150  Elnora Morrison, MD 06/15/14 [phone]

## 2014-06-14 NOTE — ED Notes (Signed)
Reports having surgery about 10 days ago.  States that the bandage has come off, and noticed that the area around is a little red, but possible it is due to the adhesive.  States that the physician informed her to have new steri-strips placed and area redressed.

## 2014-06-28 ENCOUNTER — Encounter: Payer: Self-pay | Admitting: Internal Medicine

## 2014-06-28 ENCOUNTER — Ambulatory Visit (INDEPENDENT_AMBULATORY_CARE_PROVIDER_SITE_OTHER): Payer: 59 | Admitting: Internal Medicine

## 2014-06-28 VITALS — BP 126/84 | HR 80 | Temp 97.5°F | Resp 15 | Wt 140.8 lb

## 2014-06-28 DIAGNOSIS — G9600 Cerebrospinal fluid leak, unspecified: Secondary | ICD-10-CM

## 2014-06-28 DIAGNOSIS — H6123 Impacted cerumen, bilateral: Secondary | ICD-10-CM

## 2014-06-28 DIAGNOSIS — H5462 Unqualified visual loss, left eye, normal vision right eye: Secondary | ICD-10-CM | POA: Diagnosis not present

## 2014-06-28 DIAGNOSIS — Z9889 Other specified postprocedural states: Secondary | ICD-10-CM

## 2014-06-28 NOTE — Progress Notes (Signed)
Pre visit review using our clinic review tool, if applicable. No additional management support is needed unless otherwise documented below in the visit note. 

## 2014-06-28 NOTE — Assessment & Plan Note (Addendum)
Physical therapy  Phase 2 to include gentle range of motion (midrange) ; unresisted postural stretching and strengthening

## 2014-06-28 NOTE — Patient Instructions (Signed)
The Ophth, ENT, & PT  referrals will be scheduled and you'll be notified of the time.Please call the Referral Co-Ordinator @ [phone] if you have not been notified of appointment time within 5 days.

## 2014-06-28 NOTE — Progress Notes (Signed)
   Subjective:    Patient ID: Lorraine Weber, female    DOB: 25-Dec-1980, 34 y.o.   MRN: [id number]  HPI She had the T11-12 laminectomy and cerebrospinal fluid leak repair surgery in [place] on 4/21. She was discharged 4/24. She was to initiate rehabilitation approximate 4 weeks. She has been limited to ambulating with a rolling walker.  The wound initially had some clear drainage but that has resolved. She has some sweats without other constitutional symptoms.  Since her surgery she's had further decrease in vision of her left eye. 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  Since surgery she has had increased weakness on the left. The numbness, and tingling in her LU & LLEs is unchanged. Gait has been unsteady despite using a walker.  She also describes hearing loss on the left. She's been using an ear hygiene without response. She has stopped smoking.  Review of Systems Fever, chills, sweats, or unexplained weight loss not present. Significant improvement in headaches since the surgery. Mental status change or memory loss denied. Vertigo, near syncope denied. No loss of control of bladder or bowels. Radicular type pain absent. No seizure stigmata.    Objective:   Physical Exam Pertinent or positive findings include:  Chronic asymmetry of eyes. She has bilateral wax impactions.  The wound is well-healed without evidence of cellulitis or purulence.  She is walking with a rolling walker.  General appearance :Thin but adequately nourished; in no distress. Eyes: No conjunctival inflammation or scleral icterus is present. Oral exam:  Lips and gums are healthy appearing.There is no oropharyngeal erythema or exudate noted. Dental hygiene is good. Heart:  Normal rate and regular rhythm. S1 and S2 normal without gallop, murmur, click, rub or other extra sounds   Lungs:Chest clear to auscultation; no wheezes, rhonchi,rales ,or rubs present.No  increased work of breathing.  Abdomen: bowel sounds normal, soft and non-tender without masses, organomegaly or hernias noted.  No guarding or rebound.  Vascular : all pulses equal ; no bruits present. Skin:Warm & dry.  Intact without suspicious lesions or rashes ; no tenting or jaundice  Lymphatic: No lymphadenopathy is noted about the head, neck, axilla Neuro: Strength, tone normal.        Assessment & Plan:  #1 status post T11-12 laminectomy and CSF leak repair. 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 Referral made to ENT as I'm concerned about gavage aggravating her neurologic issues. The wax should be removed under direct visualization with avoidance of any trauma to TMs  #3 progressive visual loss,OS  See Orders

## 2014-07-01 ENCOUNTER — Telehealth: Payer: Self-pay | Admitting: Physical Therapy

## 2014-07-01 NOTE — Telephone Encounter (Signed)
Called [facility], gave them number to [facility], pt has no cancer diagnosis, needs to be referred to a different clinic

## 2014-07-05 ENCOUNTER — Telehealth: Payer: Self-pay | Admitting: Internal Medicine

## 2014-07-05 NOTE — Telephone Encounter (Signed)
We should get to the other 2 referrals today or tomorrow.

## 2014-07-05 NOTE — Telephone Encounter (Signed)
Dr. Linna Darner sent over 3 referrals for her last week and she hasn't heard anything yet. I told her it could take a couple of weeks but she is mostly concerned for the rehab one because she thinks her appointment is for Monday

## 2014-07-05 NOTE — Telephone Encounter (Signed)
Spoke w/Lorraine Weber [facility]. Patient does not have an appointment for Monday. They will call her today to schedule her an appointment.

## 2014-07-12 ENCOUNTER — Ambulatory Visit (HOSPITAL_COMMUNITY): Payer: 59 | Attending: Internal Medicine | Admitting: Physical Therapy

## 2014-07-12 DIAGNOSIS — Z9889 Other specified postprocedural states: Secondary | ICD-10-CM

## 2014-07-12 DIAGNOSIS — M25552 Pain in left hip: Secondary | ICD-10-CM | POA: Insufficient documentation

## 2014-07-12 DIAGNOSIS — R531 Weakness: Secondary | ICD-10-CM | POA: Diagnosis present

## 2014-07-12 DIAGNOSIS — M25551 Pain in right hip: Secondary | ICD-10-CM | POA: Diagnosis not present

## 2014-07-12 DIAGNOSIS — G9600 Cerebrospinal fluid leak, unspecified: Secondary | ICD-10-CM

## 2014-07-12 DIAGNOSIS — R269 Unspecified abnormalities of gait and mobility: Secondary | ICD-10-CM | POA: Diagnosis not present

## 2014-07-12 NOTE — Therapy (Signed)
[place], [place], [address] Phone: [phone]   Fax:  [phone]  Physical Therapy Treatment  Patient Details  Name: Lorraine Weber MRN: [id number] Date of Birth: 08/13/1980 Referring Provider:  Hendricks Limes, MD  Encounter Date: 07/12/2014      PT End of Session - 07/12/14 1231    Visit Number 1   Number of Visits 16   Date for PT Re-Evaluation 09/10/14  08/12/14 mini-reassess   Authorization Type UHC / Medicare   Authorization Time Period 10 visits   Authorization - Visit Number 1   Authorization - Number of Visits 10   Activity Tolerance Patient limited by fatigue   Behavior During Therapy [facility] for tasks assessed/performed      Past Medical History  Diagnosis Date  . Migraines   . Von Willebrand disease   . Anemia   . Peptic ulcer disease   . Concussion     x8, last 2010, residual homonymous Hemianopsia  . Migraines   . Mastoiditis   . Nephrolithiasis     X5, Dr Jeffie Pollock  . CSF leak     multiple serum pathes    Past Surgical History  Procedure Laterality Date  . Appendectomy    . Hand tendon surgery      left, complicated by MRSA  . Spinal fluid leak patching      > 10X;[facility]    There were no vitals filed for this visit.  Visit Diagnosis:  CSF leak  H/O laminectomy  Generalized weakness  Abnormality of gait      Subjective Assessment - 07/12/14 1159    Subjective Has a history of multiple concussions over the years, by the age of 75 about 35 total. Discovered she had CSF leak and unable to fix with patches in thoracic region. Underwent thoracic laminectomy to address bone spur and CSF leak per patient report on 06/02/14 in [place]. Getting stronger gradually but using walker for longer distances walking due to balance and leg strength deficits. Whole Lt side of body weakness, coordination, and numbness. Rt. side shooting pain with sitting in hip and entire leg. Goal is to be able  to take care of all the animals at home.             [facility] PT Assessment - 07/12/14 0001    Assessment   Medical Diagnosis CSF leak status post thoracic laminectomy   Onset Date/Surgical Date 06/02/14   Precautions   Precautions Other (comment)   Precaution Comments no bending, lifting, twisting, dural tension, increased abdominal pressure. Per protocol, AVOID THE FOLLOWING; joint traction/distraction, joint mobilizations around surgical site, dep myofacial release, valsalva, machines for therapeutic exercise, rotational core stabilization activities, increased surgical site pain or headache, end-range ROM crunches and sit-ups.   Restrictions   Weight Bearing Restrictions No   Balance Screen   Has the patient fallen in the past 6 months No   Has the patient had a decrease in activity level because of a fear of falling?  Yes   [place] residence   Prior Function   Level of Independence Independent   Vocation On disability   Leisure outdoors   Cognition   Overall Cognitive Status Within Functional Limits for tasks assessed   Observation/Other Assessments   Observations rounded shoulders and forward head posture   Focus on Therapeutic Outcomes (FOTO)  67% limitation   Sensation   Light Touch Impaired Detail  Additional Comments Absent sensation to light touch distal to Lt knee and diminished sensation reported through entire Lt side of body.    ROM / Strength   AROM / PROM / Strength AROM   AROM   Overall AROM  Deficits   Overall AROM Comments decreased shoulder flexion and cervical flexion, extension, sidebending and rotation bilaterally. decreased Lt andkle dorsiflexion <neutral   Strength   Overall Strength Deficits   Right Hip Flexion 4/5   Right Hip ABduction 4-/5   Right Hip ADduction 4-/5   Left Hip Flexion 3-/5   Left Hip ABduction 3-/5   Left Hip ADduction 3-/5   Right Knee Flexion 5/5   Right Knee Extension 4+/5   Left Knee  Flexion 3-/5   Left Knee Extension 3-/5   Right Ankle Dorsiflexion 5/5   Right Ankle Plantar Flexion 5/5   Left Ankle Dorsiflexion 3-/5   Left Ankle Plantar Flexion 2/5   Ambulation/Gait   Ambulation/Gait Yes   Ambulation/Gait Assistance 7: Independent   Ambulation Distance (Feet) 250 Feet   Assistive device None   Ambulation Surface Level   Gait Comments mild instability with gait with noted 3 incidents of using wall for support for mild loss of balance.    Balance   Balance comment Romberg +                             PT Education - 07/12/14 1225    Education provided Yes   Education Details seated shoulder rolls forward/backward 1X 10 reps, scapular retraction 1X 10 (all painfree range only).    Person(s) Educated Patient   Methods Explanation;Demonstration;Tactile cues;Verbal cues   Comprehension Returned demonstration;Need further instruction          PT Short Term Goals - 07/12/14 1247    PT SHORT TERM GOAL #1   Title Ambulate 1000 feet without loss of balance or instability for safety with community mobility.    Time 4   Period Weeks   Status New   PT SHORT TERM GOAL #2   Title Patient to be independent with HEP for lower extremity and core strengthening for ADLs.    Time 2   Period Weeks   Status New           PT Long Term Goals - 07/12/14 1250    PT LONG TERM GOAL #1   Title Patient to ambulate 10 minutes on vairable surfaces for safety wtih community ambulation.   Time 8   Period Weeks   Status New   PT LONG TERM GOAL #2   Title Paient to be independent with HEP as per phase 3 protocol for continued gains upon D/C from PT services.   Time 8   Period Weeks   Status New   PT LONG TERM GOAL #3   Title Patient to demonstrate proper lifting mechanics for home ADLs.    Time 8   Period Weeks   Status New               Plan - 07/12/14 1235    Clinical Impression Statement Patient is a 34 year old female who has a history  of CSF leak in the cervical and thoracic regions. She reports that she failed nonsurgical intervention for the thoracic region and had to go to [place] to have surgery there on 06/02/14. She has since returned to [place] and has been referred to PT services as per  scanned protocol. Additionally the patient has a history of multiple concussions with residual cognitive and visual changes. Evaluation revealed decreased sensation on Lt side throughout along with decreased strength and ROM Lt>Rt. Functionally the patinet reports being limited due to decreased strength and endurance as well as pain. Patint is approprate for ongoing PT services and will progress as per MD protocol.    Pt will benefit from skilled therapeutic intervention in order to improve on the following deficits Decreased activity tolerance;Decreased balance;Decreased strength;Decreased endurance;Difficulty walking   Rehab Potential Good   Clinical Impairments Affecting Rehab Potential Patient reports cognitive and visual  changes not related to surgical intervention.    PT Frequency 2x / week   PT Duration 8 weeks   PT Treatment/Interventions Gait training;Functional mobility training;Therapeutic activities;Therapeutic exercise;Balance training;Neuromuscular re-education;Patient/family education   PT Next Visit Plan Will progress as per protocol with phase 2 exercises, anticipating supine glute sets, short arc quads, isometric hip adduction, pelvic tilts. Gentle progression with caution toward above PRECAUTIONS.    PT Home Exercise Plan Provide HEP progression as patient tolerates above exercises at next session.    Consulted and Agree with Plan of Care Patient          G-Codes - 2014/08/10 1256    Functional Assessment Tool Used FOTO 67 % limited   Functional Limitation Mobility: Walking and moving around   Mobility: Walking and Moving Around Current Status [phone]) At least 60 percent but less than 80 percent impaired,  limited or restricted   Mobility: Walking and Moving Around Goal Status [phone]) At least 40 percent but less than 60 percent impaired, limited or restricted      Problem List Patient Active Problem List   Diagnosis Date Noted  . CSF leak 05/10/2014  . Complicated migraine 24/10/7351  . Hemiplegia, unspecified, affecting nondominant side 01/02/2013  . Sebaceous cyst of right axilla 07/02/2012  . Chronic sinusitis 10/01/2011  . Candida vaginitis 08/22/2010  . Preseptal cellulitis 08/14/2010  . MRSA bacteremia 08/03/2010  . GERD 04/02/2010  . ANEMIA-NOS 11/08/2009  . Von Willebrand's disease 11/08/2009  . PEPTIC ULCER DISEASE 11/08/2009  . Headache, post-traumatic 11/08/2009  . NEPHROLITHIASIS, HX OF 11/08/2009    Linard Millers 08-10-14, 2:07 PM  [facility] [address] [place], [place], [address] Phone: [phone]   Fax:  [phone]

## 2014-07-18 ENCOUNTER — Telehealth: Payer: Self-pay | Admitting: Internal Medicine

## 2014-07-18 ENCOUNTER — Encounter: Payer: Self-pay | Admitting: Internal Medicine

## 2014-07-18 ENCOUNTER — Other Ambulatory Visit: Payer: Self-pay | Admitting: Internal Medicine

## 2014-07-18 DIAGNOSIS — R110 Nausea: Secondary | ICD-10-CM

## 2014-07-18 NOTE — Telephone Encounter (Signed)
Please advise, thanks.

## 2014-07-18 NOTE — Telephone Encounter (Signed)
Patient is having nausea and headaches.  There are no openings today.  She would like to know if it could be her meds.  States that this has happened before and would like a call back in regards.

## 2014-07-18 NOTE — Telephone Encounter (Signed)
Hinton Dyer from [facility] needs information about why patient is coming in. Please call her at [phone].

## 2014-07-18 NOTE — Telephone Encounter (Signed)
I don't see medication cause unless it's Norco Stay on clear liquids for 48-72 hours or until bowels are normal.This would include  jello, sherbert (NOT ice cream), Lipton's chicken noodle soup(NOT cream based soups),Gatorade Lite, flat Ginger ale (without High Fructose Corn Syrup),dry toast or crackers, baked potato.No milk , dairy or grease Zofran 4 mg q 6-8 hrs prn #10

## 2014-07-19 ENCOUNTER — Other Ambulatory Visit (INDEPENDENT_AMBULATORY_CARE_PROVIDER_SITE_OTHER): Payer: 59

## 2014-07-19 ENCOUNTER — Other Ambulatory Visit: Payer: Self-pay

## 2014-07-19 DIAGNOSIS — R110 Nausea: Secondary | ICD-10-CM | POA: Diagnosis not present

## 2014-07-19 LAB — BASIC METABOLIC PANEL
BUN: 14 mg/dL (ref 6–23)
CO2: 17 mEq/L — ABNORMAL LOW (ref 19–32)
Calcium: 8.5 mg/dL (ref 8.4–10.5)
Chloride: 113 mEq/L — ABNORMAL HIGH (ref 96–112)
Creatinine, Ser: 1.04 mg/dL (ref 0.40–1.20)
GFR: 63.69 mL/min (ref >60.00)
Glucose, Bld: 92 mg/dL (ref 70–99)
Potassium: 3.8 mEq/L (ref 3.5–5.1)
Sodium: 137 mEq/L (ref 135–145)

## 2014-07-19 MED ORDER — ONDANSETRON HCL 4 MG PO TABS: 4.0000 mg | ORAL_TABLET | Freq: Three times a day (TID) | ORAL | Status: DC | PRN

## 2014-07-19 NOTE — Telephone Encounter (Signed)
   Please change the ophthalmology referral to [facility]; Dr. Kellie Moor office won't see her

## 2014-07-19 NOTE — Telephone Encounter (Signed)
Hinton Dyer from [facility] called back and stated that the reason patient is coming in, she would need to be referred to [facility]. They would not be able to help her

## 2014-07-19 NOTE — Telephone Encounter (Signed)
Advised patient of dr hoppers note, also that zofran rx has been sent in to pharm, and patient also stated that she was still experiencing shortness of breath---patient has made appt for June 9th at 9am--per dr hopper, patient needs office visit for shortness of breath

## 2014-07-21 ENCOUNTER — Ambulatory Visit (INDEPENDENT_AMBULATORY_CARE_PROVIDER_SITE_OTHER)
Admission: RE | Admit: 2014-07-21 | Discharge: 2014-07-21 | Disposition: A | Discharge status: Home / Self Care | Payer: 59 | Source: Ambulatory Visit | Attending: Internal Medicine | Admitting: Internal Medicine

## 2014-07-21 ENCOUNTER — Encounter: Payer: Self-pay | Admitting: Internal Medicine

## 2014-07-21 ENCOUNTER — Ambulatory Visit (INDEPENDENT_AMBULATORY_CARE_PROVIDER_SITE_OTHER): Payer: 59 | Admitting: Internal Medicine

## 2014-07-21 ENCOUNTER — Encounter (HOSPITAL_COMMUNITY): Payer: Medicare Other | Admitting: Physical Therapy

## 2014-07-21 ENCOUNTER — Other Ambulatory Visit (INDEPENDENT_AMBULATORY_CARE_PROVIDER_SITE_OTHER): Payer: 59

## 2014-07-21 ENCOUNTER — Ambulatory Visit (HOSPITAL_COMMUNITY): Payer: 59 | Attending: Internal Medicine

## 2014-07-21 VITALS — BP 134/64 | HR 107 | Temp 97.7°F | Resp 18 | Wt 139.0 lb

## 2014-07-21 DIAGNOSIS — R5383 Other fatigue: Secondary | ICD-10-CM

## 2014-07-21 DIAGNOSIS — R5381 Other malaise: Secondary | ICD-10-CM

## 2014-07-21 DIAGNOSIS — M25551 Pain in right hip: Secondary | ICD-10-CM | POA: Insufficient documentation

## 2014-07-21 DIAGNOSIS — R0600 Dyspnea, unspecified: Secondary | ICD-10-CM

## 2014-07-21 DIAGNOSIS — R531 Weakness: Secondary | ICD-10-CM | POA: Insufficient documentation

## 2014-07-21 DIAGNOSIS — IMO0001 Reserved for inherently not codable concepts without codable children: Secondary | ICD-10-CM

## 2014-07-21 DIAGNOSIS — M609 Myositis, unspecified: Secondary | ICD-10-CM

## 2014-07-21 DIAGNOSIS — M791 Myalgia: Secondary | ICD-10-CM

## 2014-07-21 DIAGNOSIS — G9600 Cerebrospinal fluid leak, unspecified: Secondary | ICD-10-CM

## 2014-07-21 DIAGNOSIS — M25552 Pain in left hip: Secondary | ICD-10-CM | POA: Diagnosis not present

## 2014-07-21 DIAGNOSIS — R269 Unspecified abnormalities of gait and mobility: Secondary | ICD-10-CM

## 2014-07-21 DIAGNOSIS — Z9889 Other specified postprocedural states: Secondary | ICD-10-CM

## 2014-07-21 LAB — CBC WITH DIFFERENTIAL/PLATELET
Basophils Absolute: 0 10*3/uL (ref 0.0–0.1)
Basophils Relative: 0.1 % (ref 0.0–3.0)
Eosinophils Absolute: 0.1 10*3/uL (ref 0.0–0.7)
Eosinophils Relative: 0.9 % (ref 0.0–5.0)
HCT: 40.9 % (ref 36.0–46.0)
Hemoglobin: 13.7 g/dL (ref 12.0–15.0)
Lymphocytes Relative: 24.7 % (ref 12.0–46.0)
Lymphs Abs: 1.9 10*3/uL (ref 0.7–4.0)
MCHC: 33.5 g/dL (ref 30.0–36.0)
MCV: 87.9 fl (ref 78.0–100.0)
Monocytes Absolute: 0.6 10*3/uL (ref 0.1–1.0)
Monocytes Relative: 7.6 % (ref 3.0–12.0)
Neutro Abs: 5.1 10*3/uL (ref 1.4–7.7)
Neutrophils Relative %: 66.7 % (ref 43.0–77.0)
Platelets: 269 10*3/uL (ref 150.0–400.0)
RBC: 4.66 Mil/uL (ref 3.87–5.11)
RDW: 18 % — ABNORMAL HIGH (ref 11.5–15.5)
WBC: 7.7 10*3/uL (ref 4.0–10.5)

## 2014-07-21 LAB — CK: Total CK: 24 U/L (ref 7–177)

## 2014-07-21 LAB — HEPATIC FUNCTION PANEL
ALT: 6 U/L (ref 0–35)
AST: 10 U/L (ref 0–37)
Albumin: 4.3 g/dL (ref 3.5–5.2)
Alkaline Phosphatase: 83 U/L (ref 39–117)
Bilirubin, Direct: 0.1 mg/dL (ref 0.0–0.3)
Total Bilirubin: 0.3 mg/dL (ref 0.2–1.2)
Total Protein: 7.5 g/dL (ref 6.0–8.3)

## 2014-07-21 LAB — TSH: TSH: 0.71 u[IU]/mL (ref 0.35–4.50)

## 2014-07-21 NOTE — Progress Notes (Signed)
   Subjective:    Patient ID: Lorraine Weber, female    DOB: [birth year], 34 y.o.   MRN: [id number]  HPI She is describing malaise and fatigue a sensation of "feeling cold and not well". She also has dyspnea at rest which is worse with activity. She thought this might be related to Diamox causing hypokalemia but this was normal. She describes aching all over mainly muscles, not joints. Tylenol provides minimal relief.  She describes palpitations with walking or exercise. She also has constipation. Her last bowel movement was 07/17/14. This is despite Colace, laxities, prone juice, increase fluids.  She has von Willebrand's but has had no bleeding dyscrasias.  She has chronic left-sided weakness without change.  She started physical therapy 07/12/14 as requested by her Neurosurgeon. On 05/05/14 she had a T9-11 laminectomy for CSF leak. She's had no post op bleeding or infection @ op site.  Review of Systems This shortness of breath is not associated with any chest pain. Epistaxis, hemoptysis, hematuria, melena, or rectal bleeding denied. No unexplained weight loss, significant dyspepsia,dysphagia, or abdominal pain.  There is no abnormal bruising , bleeding, or difficulty stopping bleeding with injury. Specifically she denies any fever, chills, sweats.      Objective:   Physical Exam  Pertinent or positive findings include:she is thin but appears adequately nourished. She has acneiform facial scarring. There is facial asymmetry which is chronic. An S4 is present; repeat pulse rate was 92. She has slight crepitus of the knees. Pedal pulses are decreased but equal.  General appearance :adequately nourished; in no distress.  Eyes: No conjunctival inflammation or scleral icterus is present.  Oral exam:  Lips and gums are healthy appearing.There is no oropharyngeal erythema or exudate noted. Dental hygiene is good.  Heart:   regular rhythm. S1 and S2 normal without  murmur, click, rub or other  extra sounds    Lungs:Chest clear to auscultation; no wheezes, rhonchi,rales ,or rubs present.No increased work of breathing.   Abdomen: bowel sounds normal, soft and non-tender without masses, organomegaly or hernias noted.  No guarding or rebound.   Vascular : no bruits present. Homans sign negative bilaterally  Skin:Warm & dry.  Intact without suspicious lesions or rashes ; no tenting or jaundice   Lymphatic: No lymphadenopathy is noted about the head, neck, axilla  Neuro: Strength, tone & DTRs normal.        Assessment & Plan:  #1 malaise # myalgias #3 dyspnea in context of being sedentary post op See orders

## 2014-07-21 NOTE — Therapy (Signed)
[place], [place], [address] Phone: [phone]   Fax:  [phone]  Physical Therapy Treatment  Patient Details  Name: Lorraine Weber MRN: [id number] Date of Birth: [birth year] Referring Provider:  Hendricks Limes, MD  Encounter Date: 07/21/2014      PT End of Session - 07/21/14 1631    Visit Number 2   Number of Visits 16   Date for PT Re-Evaluation 09/10/14   Authorization Type UHC / Medicare   Authorization Time Period 10 visits   Authorization - Visit Number 2   Authorization - Number of Visits 10   PT Start Time 1526   PT Stop Time 1619   PT Time Calculation (min) 53 min   Activity Tolerance Patient tolerated treatment well   Behavior During Therapy [facility] for tasks assessed/performed      Past Medical History  Diagnosis Date  . Migraines   . Von Willebrand disease   . Anemia   . Peptic ulcer disease   . Concussion     x8, last 2010, residual homonymous Hemianopsia  . Migraines   . Mastoiditis   . Nephrolithiasis     X5, Dr Jeffie Pollock  . CSF leak     multiple serum pathes    Past Surgical History  Procedure Laterality Date  . Appendectomy    . Hand tendon surgery      left, complicated by MRSA  . Spinal fluid leak patching      > 10X;[facility]    There were no vitals filed for this visit.  Visit Diagnosis:  CSF leak  H/O laminectomy  Generalized weakness  Abnormality of gait      Subjective Assessment - 07/21/14 1539    Currently in Pain? Yes   Pain Score 5    Pain Location Head   Pain Descriptors / Indicators Aching   Pain Type Chronic pain                         [facility] Adult PT Treatment/Exercise - 07/21/14 1622    Ambulation/Gait   Ambulation/Gait Yes   Ambulation/Gait Assistance 6: Modified independent (Device/Increase time)   Ambulation Distance (Feet) 675 Feet   Assistive device Straight cane   Gait Pattern Step-through pattern;Within Functional Limits    Gait velocity 2.64ft/sec, 0.38m/s   Gait Comments Teaching sequencing with SPC; needs some practice for 2 point gait with SPC in RUE.    Lumbar Exercises: Supine   Ab Set 10 reps;3 seconds  Transverse Abd activation c flaccid Rectus Abd   AB Set Limitations tactile feedback with hands and able to continue to breath/talk normally.    Heel Slides 10 reps  bilat alternating with TrA contraction   Dead Bug 10 reps  Alternating BUE only, c TrA contraction   Straight Leg Raise 10 reps;1 second  Alternating 8 inch rise only, with Min-ModA on LLE                PT Education - 07/21/14 1629    Education provided Yes   Education Details Discussed role to TrA training on future therapy; educated patient on avoiding step-over gait for stairs until training here takes place. Proper SPC adjustment.    Person(s) Educated Patient;Spouse   Methods Explanation   Comprehension Verbalized understanding;Verbal cues required;Returned demonstration;Tactile cues required          PT Short Term Goals - 07/12/14 1247    PT  SHORT TERM GOAL #1   Title Ambulate 1000 feet without loss of balance or instability for safety with community mobility.    Time 4   Period Weeks   Status New   PT SHORT TERM GOAL #2   Title Patient to be independent with HEP for lower extremity and core strengthening for ADLs.    Time 2   Period Weeks   Status New           PT Long Term Goals - 07/12/14 1250    PT LONG TERM GOAL #1   Title Patient to ambulate 10 minutes on vairable surfaces for safety wtih community ambulation.   Time 8   Period Weeks   Status New   PT LONG TERM GOAL #2   Title Paient to be independent with HEP as per phase 3 protocol for continued gains upon D/C from PT services.   Time 8   Period Weeks   Status New   PT LONG TERM GOAL #3   Title Patient to demonstrate proper lifting mechanics for home ADLs.    Time 8   Period Weeks   Status New               Plan - 07/21/14  1632    Clinical Impression Statement Pt tolerating treatment session well, motivated and able to complete entire PT sesssion as planned. Pt continues to remain in 'Phase 2" of rehab protocol from spine surgeon, limiting most BLT, as well as increases in intraabdominal pressure. Pt has made considerable improvements since last visit, frequently practicing short distance ambulation with supervision. Pt reports she is ready to progress to gait with SPC, is curious about when to progress to step-over-step pattern for stairs, and motivated to begin higher level balance/stability training. Pt continues to make progress toward goals as evidenced by improved activity tolerance and ambulation distance. Pt's greatest limitation continues to be balance deficits, LLE weakness, and L-sided paresthesia which continues to limit ability to perform daily tasks at baseline function. Patient presenting with impairment of strength, pain, range of motion, balance, and activity tolerance, limiting ability to perform ADL and mobility tasks at  baseline level of function. Patient will benefit from skilled intervention to address the above impairments and limitations, in order to restore to prior level of function, improve patient safety upon discharge, and to decrease caregiver burden.    Pt will benefit from skilled therapeutic intervention in order to improve on the following deficits Decreased activity tolerance;Decreased balance;Decreased strength;Decreased endurance;Difficulty walking   Rehab Potential Good   Clinical Impairments Affecting Rehab Potential Patient reports cognitive and visual  changes not related to surgical intervention.    PT Frequency 2x / week   PT Duration 8 weeks   PT Treatment/Interventions Gait training;Functional mobility training;Therapeutic activities;Therapeutic exercise;Balance training;Neuromuscular re-education;Patient/family education   PT Next Visit Plan Will progress as per protocol with  phase 2 exercises, anticipating supine glute sets, short arc quads, isometric hip adduction, pelvic tilts. Gentle progression with caution toward above PRECAUTIONS. Consider additon of step-over-step gait training.    PT Home Exercise Plan Provide HEP progression as patient tolerates above exercises at next session.    Consulted and Agree with Plan of Care Patient;Family member/caregiver   Family Member Consulted Husband         Problem List Patient Active Problem List   Diagnosis Date Noted  . CSF leak 05/10/2014  . Complicated migraine 31/51/7616  . Hemiplegia, unspecified, affecting nondominant side 01/02/2013  . Sebaceous cyst  of right axilla 07/02/2012  . Chronic sinusitis 10/01/2011  . Candida vaginitis 08/22/2010  . Preseptal cellulitis 08/14/2010  . MRSA bacteremia 08/03/2010  . GERD 04/02/2010  . ANEMIA-NOS 11/08/2009  . Von Willebrand's disease 11/08/2009  . PEPTIC ULCER DISEASE 11/08/2009  . Headache, post-traumatic 11/08/2009  . NEPHROLITHIASIS, HX OF 11/08/2009    Mark Hassey C 07/21/2014, 4:39 PM  4:39 PM  Etta Grandchild, PT, DPT [place] License # [id number]       [facility] [address] [place], [place], [address] Phone: [phone]   Fax:  [phone]

## 2014-07-21 NOTE — Patient Instructions (Signed)
  Your next office appointment will be determined based upon review of your pending labs  and  xrays  Those written interpretation of the lab results and instructions will be transmitted to you by My Chart   Critical results will be called.   Followup as needed for any active or acute issue. Please report any significant change in your symptoms. 

## 2014-07-21 NOTE — Patient Instructions (Signed)
Stabilization: Transverse Abdominus Contraction - Supine  Asked patient to practice 3-5 sets/day or 10 reps lasting 3-5 seconds. Pt encouraged to gradually increase hold time as it becomes easier.    Lie with knees bent, feet flat. Place fingers on abdominal muscles just inside pelvis. Contract abdominals, pulling naval toward spine. Feel muscle contract, but keep pelvis and back still. Repeat ____ times per set. Do ____ sets per session. Do ____ sessions per week.  Copyright  VHI. All rights reserved.

## 2014-07-21 NOTE — Progress Notes (Signed)
Pre visit review using our clinic review tool, if applicable. No additional management support is needed unless otherwise documented below in the visit note. 

## 2014-07-22 ENCOUNTER — Ambulatory Visit (HOSPITAL_COMMUNITY): Payer: 59 | Admitting: Physical Therapy

## 2014-07-22 ENCOUNTER — Other Ambulatory Visit: Payer: Self-pay | Admitting: Internal Medicine

## 2014-07-22 ENCOUNTER — Telehealth: Payer: Self-pay | Admitting: Emergency Medicine

## 2014-07-22 ENCOUNTER — Ambulatory Visit (INDEPENDENT_AMBULATORY_CARE_PROVIDER_SITE_OTHER)
Admission: RE | Admit: 2014-07-22 | Discharge: 2014-07-22 | Disposition: A | Discharge status: Home / Self Care | Payer: 59 | Source: Ambulatory Visit | Attending: Internal Medicine | Admitting: Internal Medicine

## 2014-07-22 DIAGNOSIS — R7989 Other specified abnormal findings of blood chemistry: Secondary | ICD-10-CM

## 2014-07-22 DIAGNOSIS — G9600 Cerebrospinal fluid leak, unspecified: Secondary | ICD-10-CM

## 2014-07-22 DIAGNOSIS — R791 Abnormal coagulation profile: Secondary | ICD-10-CM

## 2014-07-22 DIAGNOSIS — R269 Unspecified abnormalities of gait and mobility: Secondary | ICD-10-CM

## 2014-07-22 DIAGNOSIS — Z9889 Other specified postprocedural states: Secondary | ICD-10-CM

## 2014-07-22 DIAGNOSIS — R0600 Dyspnea, unspecified: Secondary | ICD-10-CM

## 2014-07-22 DIAGNOSIS — R531 Weakness: Secondary | ICD-10-CM | POA: Diagnosis not present

## 2014-07-22 LAB — D-DIMER, QUANTITATIVE: D-Dimer, Quant: 0.7 ug/mL-FEU — ABNORMAL HIGH (ref 0.00–0.48)

## 2014-07-22 MED ORDER — IOHEXOL 350 MG/ML SOLN
80.0000 mL | Freq: Once | INTRAVENOUS | Status: AC | PRN
  Administered 2014-07-22: 80 mL via INTRAVENOUS

## 2014-07-22 NOTE — Progress Notes (Signed)
Pt going to [facility] @ 2:30pm. She is aware.

## 2014-07-22 NOTE — Telephone Encounter (Signed)
CT called, results negative. Pt asked if there were any precautions that needed to be taken over the weekend since the CT results were negative. Please advise.

## 2014-07-22 NOTE — Therapy (Signed)
[place], [place], [address] Phone: [phone]   Fax:  [phone]  Physical Therapy Treatment  Patient Details  Name: Lorraine Weber MRN: [id number] Date of Birth: Jun 17, 1980 Referring Provider:  Hendricks Limes, MD  Encounter Date: 07/22/2014      PT End of Session - 07/21/14 1631    Visit Number 2   Number of Visits 16   Date for PT Re-Evaluation 09/10/14   Authorization Type UHC / Medicare   Authorization Time Period 10 visits   Authorization - Visit Number 2   Authorization - Number of Visits 10   PT Start Time 1526   PT Stop Time 1619   PT Time Calculation (min) 53 min   Activity Tolerance Patient tolerated treatment well   Behavior During Therapy [facility] for tasks assessed/performed      Past Medical History  Diagnosis Date  . Migraines   . Von Willebrand disease   . Anemia   . Peptic ulcer disease   . Concussion     x8, last 2010, residual homonymous Hemianopsia  . Migraines   . Mastoiditis   . Nephrolithiasis     X5, Dr Jeffie Pollock  . CSF leak     multiple serum pathes    Past Surgical History  Procedure Laterality Date  . Appendectomy    . Hand tendon surgery      left, complicated by MRSA  . Spinal fluid leak patching      > 10X;[facility]    There were no vitals filed for this visit.  Visit Diagnosis:  CSF leak  H/O laminectomy  Generalized weakness  Abnormality of gait      Subjective Assessment - 07/22/14 1306    Subjective generally doing pretty good. got some result from a test result earlier today and may have some blood clots. Not sure really.                          [place] Adult PT Treatment/Exercise - 07/21/14 1622    Ambulation/Gait   Ambulation/Gait Yes   Ambulation/Gait Assistance 6: Modified independent (Device/Increase time)   Ambulation Distance (Feet) 675 Feet   Assistive device Straight cane   Gait Pattern Step-through pattern;Within  Functional Limits   Gait velocity 2.43ft/sec, 0.72m/s   Gait Comments Teaching sequencing with SPC; needs some practice for 2 point gait with SPC in RUE.    Lumbar Exercises: Supine   Ab Set 10 reps;3 seconds  Transverse Abd activation c flaccid Rectus Abd   AB Set Limitations tactile feedback with hands and able to continue to breath/talk normally.    Heel Slides 10 reps  bilat alternating with TrA contraction   Dead Bug 10 reps  Alternating BUE only, c TrA contraction   Straight Leg Raise 10 reps;1 second  Alternating 8 inch rise only, with Min-ModA on LLE                PT Education - 07/21/14 1629    Education provided Yes   Education Details Discussed role to TrA training on future therapy; educated patient on avoiding step-over gait for stairs until training here takes place. Proper SPC adjustment.    Person(s) Educated Patient;Spouse   Methods Explanation   Comprehension Verbalized understanding;Verbal cues required;Returned demonstration;Tactile cues required          PT Short Term Goals - 07/12/14 1247    PT SHORT TERM GOAL #1  Title Ambulate 1000 feet without loss of balance or instability for safety with community mobility.    Time 4   Period Weeks   Status New   PT SHORT TERM GOAL #2   Title Patient to be independent with HEP for lower extremity and core strengthening for ADLs.    Time 2   Period Weeks   Status New           PT Long Term Goals - 07/12/14 1250    PT LONG TERM GOAL #1   Title Patient to ambulate 10 minutes on vairable surfaces for safety wtih community ambulation.   Time 8   Period Weeks   Status New   PT LONG TERM GOAL #2   Title Paient to be independent with HEP as per phase 3 protocol for continued gains upon D/C from PT services.   Time 8   Period Weeks   Status New   PT LONG TERM GOAL #3   Title Patient to demonstrate proper lifting mechanics for home ADLs.    Time 8   Period Weeks   Status New                Plan - 07/22/14 1326    Clinical Impression Statement While conducting subjective portion of treatment the patient mentioned that she got some test results with a description of possible blood clots. While discussing this she received a message from her 71 office  that she would need to go to [facility] immediatly for further testing. No treatment was conducted.    PT Next Visit Plan Ensure patient appropriate for PT sessions, see plan for details.        Problem List Patient Active Problem List   Diagnosis Date Noted  . CSF leak 05/10/2014  . Complicated migraine 35/59/7416  . Hemiplegia, unspecified, affecting nondominant side 01/02/2013  . Sebaceous cyst of right axilla 07/02/2012  . Chronic sinusitis 10/01/2011  . Candida vaginitis 08/22/2010  . Preseptal cellulitis 08/14/2010  . MRSA bacteremia 08/03/2010  . GERD 04/02/2010  . ANEMIA-NOS 11/08/2009  . Von Willebrand's disease 11/08/2009  . PEPTIC ULCER DISEASE 11/08/2009  . Headache, post-traumatic 11/08/2009  . NEPHROLITHIASIS, HX OF 11/08/2009    Cassell Clement, PT, CSCS  07/22/2014, 1:30 PM  [place] [address] [place], [place], [address] Phone: [phone]   Fax:  [phone]

## 2014-07-23 ENCOUNTER — Encounter: Payer: Self-pay | Admitting: Internal Medicine

## 2014-08-04 ENCOUNTER — Ambulatory Visit (HOSPITAL_COMMUNITY): Payer: 59 | Admitting: Physical Therapy

## 2014-08-04 DIAGNOSIS — R269 Unspecified abnormalities of gait and mobility: Secondary | ICD-10-CM

## 2014-08-04 DIAGNOSIS — Z9889 Other specified postprocedural states: Secondary | ICD-10-CM

## 2014-08-04 DIAGNOSIS — G9600 Cerebrospinal fluid leak, unspecified: Secondary | ICD-10-CM

## 2014-08-04 DIAGNOSIS — R531 Weakness: Secondary | ICD-10-CM | POA: Diagnosis not present

## 2014-08-04 NOTE — Therapy (Signed)
[place], [place], [address] Phone: [phone]   Fax:  [phone]  Physical Therapy Treatment  Patient Details  Name: KANYA POTTEIGER MRN: [id number] Date of Birth: 19-May-1980 Referring Provider:  Hendricks Limes, MD  Encounter Date: 08/04/2014      PT End of Session - 08/04/14 1555    Visit Number 3   Number of Visits 16   Date for PT Re-Evaluation 09/10/14   Authorization Type UHC / Medicare   Authorization Time Period 10 visits   Authorization - Visit Number 3   Authorization - Number of Visits 10   PT Start Time 1300   PT Stop Time 1358   PT Time Calculation (min) 58 min   Activity Tolerance Patient tolerated treatment well   Behavior During Therapy [facility] for tasks assessed/performed      Past Medical History  Diagnosis Date  . Migraines   . Von Willebrand disease   . Anemia   . Peptic ulcer disease   . Concussion     x8, last 2010, residual homonymous Hemianopsia  . Migraines   . Mastoiditis   . Nephrolithiasis     X5, Dr Jeffie Pollock  . CSF leak     multiple serum pathes    Past Surgical History  Procedure Laterality Date  . Appendectomy    . Hand tendon surgery      left, complicated by MRSA  . Spinal fluid leak patching      > 10X;[facility]    There were no vitals filed for this visit.  Visit Diagnosis:  CSF leak  H/O laminectomy  Generalized weakness  Abnormality of gait      Subjective Assessment - 08/04/14 1559    Subjective Pt states she is walking a mile at home.  STates her pressure went up on Saturday but was able to regulate with medication.  PT reports she has a slight headache today but otherwise doing well.  PT is using a SPC to ambulate with.                         [place] Adult PT Treatment/Exercise - 08/04/14 1543    Lumbar Exercises: Seated   Other Seated Lumbar Exercises UE flexion and abduction 10 reps   Other Seated Lumbar Exercises thoracic  excursion A/P only 10reps UE's crossed, cerivcal retraction   Lumbar Exercises: Supine   Ab Set 10 reps   Glut Set 10 reps;3 seconds   Heel Slides 10 reps   Heel Slides Limitations alternating with TrA contraction   Dead Bug 10 reps   Straight Leg Raise 10 reps;1 second   Straight Leg Raises Limitations alternating   Other Supine Lumbar Exercises hip add isometric 10X3" holds   Other Supine Lumbar Exercises SAQ, pelvic tilts 10 reps each                  PT Short Term Goals - 07/12/14 1247    PT SHORT TERM GOAL #1   Title Ambulate 1000 feet without loss of balance or instability for safety with community mobility.    Time 4   Period Weeks   Status New   PT SHORT TERM GOAL #2   Title Patient to be independent with HEP for lower extremity and core strengthening for ADLs.    Time 2   Period Weeks   Status New  PT Long Term Goals - 07/12/14 1250    PT LONG TERM GOAL #1   Title Patient to ambulate 10 minutes on vairable surfaces for safety wtih community ambulation.   Time 8   Period Weeks   Status New   PT LONG TERM GOAL #2   Title Paient to be independent with HEP as per phase 3 protocol for continued gains upon D/C from PT services.   Time 8   Period Weeks   Status New   PT LONG TERM GOAL #3   Title Patient to demonstrate proper lifting mechanics for home ADLs.    Time 8   Period Weeks   Status New               Plan - 08/04/14 1555    Clinical Impression Statement Progressed to phase 2 of "Thoracic Laminectomy for CSF leak repair" protocol.  Pt with questions regarding activity level at home and recreational abilities/limitations.  Instructed patient to contact surgeon in [place] to get limitations.  Pt was able to complete all actvities at clinic without difficulty or increasing symptoms.  Gentle thoracic ROM compelted.      PT Next Visit Plan Progress per specific protocol.  Inquire of limitations set by surgical MD.  Add sit to stand,  initiate postural stretching and strenthening.  Increase repetitions of exercises to 20 reps.         Problem List Patient Active Problem List   Diagnosis Date Noted  . CSF leak 05/10/2014  . Complicated migraine 73/71/0626  . Hemiplegia, unspecified, affecting nondominant side 01/02/2013  . Sebaceous cyst of right axilla 07/02/2012  . Chronic sinusitis 10/01/2011  . Candida vaginitis 08/22/2010  . Preseptal cellulitis 08/14/2010  . MRSA bacteremia 08/03/2010  . GERD 04/02/2010  . ANEMIA-NOS 11/08/2009  . Von Willebrand's disease 11/08/2009  . PEPTIC ULCER DISEASE 11/08/2009  . Headache, post-traumatic 11/08/2009  . NEPHROLITHIASIS, HX OF 11/08/2009    Teena Irani, PTA/CLT [phone]  08/04/2014, 4:06 PM  [place] [address] [place], [place], [address] Phone: [phone]   Fax:  [phone]

## 2014-08-05 ENCOUNTER — Ambulatory Visit (HOSPITAL_COMMUNITY): Payer: 59

## 2014-08-07 ENCOUNTER — Emergency Department (HOSPITAL_COMMUNITY): Payer: 59

## 2014-08-07 ENCOUNTER — Emergency Department (HOSPITAL_COMMUNITY)
Admission: EM | Admit: 2014-08-07 | Discharge: 2014-08-07 | Disposition: A | Discharge status: Home / Self Care | Payer: 59 | Attending: Emergency Medicine | Admitting: Emergency Medicine

## 2014-08-07 ENCOUNTER — Encounter (HOSPITAL_COMMUNITY): Payer: Self-pay | Admitting: Emergency Medicine

## 2014-08-07 DIAGNOSIS — Z3202 Encounter for pregnancy test, result negative: Secondary | ICD-10-CM | POA: Diagnosis not present

## 2014-08-07 DIAGNOSIS — K5900 Constipation, unspecified: Secondary | ICD-10-CM | POA: Diagnosis not present

## 2014-08-07 DIAGNOSIS — Z720 Tobacco use: Secondary | ICD-10-CM | POA: Diagnosis not present

## 2014-08-07 DIAGNOSIS — Z862 Personal history of diseases of the blood and blood-forming organs and certain disorders involving the immune mechanism: Secondary | ICD-10-CM | POA: Diagnosis not present

## 2014-08-07 DIAGNOSIS — Z8679 Personal history of other diseases of the circulatory system: Secondary | ICD-10-CM | POA: Insufficient documentation

## 2014-08-07 DIAGNOSIS — Z87442 Personal history of urinary calculi: Secondary | ICD-10-CM | POA: Diagnosis not present

## 2014-08-07 DIAGNOSIS — Z8711 Personal history of peptic ulcer disease: Secondary | ICD-10-CM | POA: Diagnosis not present

## 2014-08-07 DIAGNOSIS — N390 Urinary tract infection, site not specified: Secondary | ICD-10-CM

## 2014-08-07 DIAGNOSIS — R1084 Generalized abdominal pain: Secondary | ICD-10-CM | POA: Diagnosis present

## 2014-08-07 DIAGNOSIS — Z79899 Other long term (current) drug therapy: Secondary | ICD-10-CM | POA: Diagnosis not present

## 2014-08-07 DIAGNOSIS — R1011 Right upper quadrant pain: Secondary | ICD-10-CM | POA: Diagnosis not present

## 2014-08-07 DIAGNOSIS — Z8669 Personal history of other diseases of the nervous system and sense organs: Secondary | ICD-10-CM | POA: Diagnosis not present

## 2014-08-07 LAB — CBC WITH DIFFERENTIAL/PLATELET
Basophils Absolute: 0 10*3/uL (ref 0.0–0.1)
Basophils Relative: 0 % (ref 0–1)
Eosinophils Absolute: 0.1 10*3/uL (ref 0.0–0.7)
Eosinophils Relative: 1 % (ref 0–5)
HCT: 39.8 % (ref 36.0–46.0)
Hemoglobin: 13.6 g/dL (ref 12.0–15.0)
Lymphocytes Relative: 30 % (ref 12–46)
Lymphs Abs: 2.1 10*3/uL (ref 0.7–4.0)
MCH: 29.4 pg (ref 26.0–34.0)
MCHC: 34.2 g/dL (ref 30.0–36.0)
MCV: 86.1 fL (ref 78.0–100.0)
Monocytes Absolute: 0.4 10*3/uL (ref 0.1–1.0)
Monocytes Relative: 6 % (ref 3–12)
Neutro Abs: 4.4 10*3/uL (ref 1.7–7.7)
Neutrophils Relative %: 63 % (ref 43–77)
Platelets: 279 10*3/uL (ref 150–400)
RBC: 4.62 MIL/uL (ref 3.87–5.11)
RDW: 17.1 % — ABNORMAL HIGH (ref 11.5–15.5)
WBC: 6.9 10*3/uL (ref 4.0–10.5)

## 2014-08-07 LAB — URINE MICROSCOPIC-ADD ON

## 2014-08-07 LAB — URINALYSIS, ROUTINE W REFLEX MICROSCOPIC
Bilirubin Urine: NEGATIVE
Glucose, UA: NEGATIVE mg/dL
Ketones, ur: NEGATIVE mg/dL
Nitrite: NEGATIVE
Protein, ur: NEGATIVE mg/dL
Specific Gravity, Urine: 1.023 (ref 1.005–1.030)
Urobilinogen, UA: 0.2 mg/dL (ref 0.0–1.0)
pH: 5 (ref 5.0–8.0)

## 2014-08-07 LAB — COMPREHENSIVE METABOLIC PANEL WITH GFR
ALT: 9 U/L — ABNORMAL LOW (ref 14–54)
AST: 15 U/L (ref 15–41)
Albumin: 4.3 g/dL (ref 3.5–5.0)
Alkaline Phosphatase: 86 U/L (ref 38–126)
Anion gap: 8 (ref 5–15)
BUN: 10 mg/dL (ref 6–20)
CO2: 16 mmol/L — ABNORMAL LOW (ref 22–32)
Calcium: 8.6 mg/dL — ABNORMAL LOW (ref 8.9–10.3)
Chloride: 115 mmol/L — ABNORMAL HIGH (ref 101–111)
Creatinine, Ser: 1.03 mg/dL — ABNORMAL HIGH (ref 0.44–1.00)
GFR calc Af Amer: >60 mL/min
GFR calc non Af Amer: >60 mL/min
Glucose, Bld: 91 mg/dL (ref 65–99)
Potassium: 3.5 mmol/L (ref 3.5–5.1)
Sodium: 139 mmol/L (ref 135–145)
Total Bilirubin: 0.4 mg/dL (ref 0.3–1.2)
Total Protein: 7.5 g/dL (ref 6.5–8.1)

## 2014-08-07 LAB — LIPASE, BLOOD: Lipase: 24 U/L (ref 22–51)

## 2014-08-07 LAB — I-STAT TROPONIN, ED: Troponin i, poc: 0 ng/mL (ref 0.00–0.08)

## 2014-08-07 LAB — POC URINE PREG, ED: Preg Test, Ur: NEGATIVE

## 2014-08-07 MED ORDER — HYDROCODONE-ACETAMINOPHEN 5-325 MG PO TABS: 2.0000 | ORAL_TABLET | ORAL | Status: DC | PRN

## 2014-08-07 MED ORDER — ONDANSETRON HCL 4 MG/2ML IJ SOLN
4.0000 mg | Freq: Once | INTRAMUSCULAR | Status: AC
  Administered 2014-08-07: 4 mg via INTRAVENOUS
  Filled 2014-08-07: qty 2

## 2014-08-07 MED ORDER — POLYETHYLENE GLYCOL 3350 17 G PO PACK: 17.0000 g | PACK | Freq: Every day | ORAL | Status: DC

## 2014-08-07 MED ORDER — HYDROMORPHONE HCL 1 MG/ML IJ SOLN
1.0000 mg | Freq: Once | INTRAMUSCULAR | Status: AC
  Administered 2014-08-07: 1 mg via INTRAVENOUS
  Filled 2014-08-07: qty 1

## 2014-08-07 MED ORDER — SODIUM CHLORIDE 0.9 % IV BOLUS (SEPSIS)
1000.0000 mL | Freq: Once | INTRAVENOUS | Status: AC
  Administered 2014-08-07: 1000 mL via INTRAVENOUS

## 2014-08-07 MED ORDER — ONDANSETRON HCL 4 MG/2ML IJ SOLN: 4.0000 mg | Freq: Once | INTRAMUSCULAR | Status: DC

## 2014-08-07 MED ORDER — CEPHALEXIN 500 MG PO CAPS: 500.0000 mg | ORAL_CAPSULE | Freq: Four times a day (QID) | ORAL | Status: DC

## 2014-08-07 NOTE — ED Notes (Signed)
C/o RUQ pain x 3 days with nausea and vomiting.  Also reports constipation that she states has been ongoing.  Last BM 1 week ago that was - "white pebble size."

## 2014-08-07 NOTE — ED Provider Notes (Signed)
CSN: [id number]     Arrival date & time 08/07/14  1731 History   First MD Initiated Contact with Patient 08/07/14 1817     Chief Complaint  Patient presents with  . Abdominal Pain     (Consider location/radiation/quality/duration/timing/severity/associated sxs/prior Treatment) Patient is a 34 y.o. female presenting with abdominal pain. The history is provided by the patient. No language interpreter was used.  Abdominal Pain Pain location:  Generalized Pain quality: aching   Pain radiates to:  Epigastric region Pain severity:  Moderate Duration:  3 days  Pt complains of pain in right upper abdomen.  Pt had back surgery 2 months ago.  Pt reports pain with breathing.   Past Medical History  Diagnosis Date  . Migraines   . Von Willebrand disease   . Anemia   . Peptic ulcer disease   . Concussion     x8, last 2010, residual homonymous Hemianopsia  . Migraines   . Mastoiditis   . Nephrolithiasis     X5, Dr Jeffie Pollock  . CSF leak     multiple serum pathes   Past Surgical History  Procedure Laterality Date  . Appendectomy    . Hand tendon surgery      left, complicated by MRSA  . Spinal fluid leak patching      > 10X;[facility]   Family History  Problem Relation Age of Onset  . Arthritis Father   . Hyperlipidemia Father   . Diabetes Father   . Stroke Father     PTE post CVA  . Prostate cancer Paternal Grandfather   . Hyperlipidemia Paternal Grandfather   . Diabetes Paternal Grandfather   . Heart disease Mother     ?hypertrophic idiopathic subaortic stenosis  . Heart disease Maternal Grandmother   . Diabetes Maternal Grandmother   . Heart disease Maternal Grandfather     HISS  . Heart disease Maternal Aunt     HISS  . Stroke Maternal Aunt 50  . Heart disease Maternal Uncle   . Heart disease Brother     HISS   History  Substance Use Topics  . Smoking status: Current Some Day Smoker -- 0.30 packs/day for 10 years    Types: Cigarettes  . Smokeless tobacco: Never Used    Comment: smoked age 80-present, up to 1 ppd.05/24/14 1/2 ppd  . Alcohol Use: No   OB History    No data available     Review of Systems  Gastrointestinal: Positive for abdominal pain.  All other systems reviewed and are negative.     Allergies  Aspirin; Levofloxacin; Other; and Topamax  Home Medications   Prior to Admission medications   Medication Sig Start Date End Date Taking? Authorizing Provider  acetaZOLAMIDE (DIAMOX SEQUELS) 500 MG capsule Take 500 mg by mouth 3 (three) times daily.    Historical Provider, MD  butalbital-acetaminophen-caffeine (ESGIC) 50-325-40 MG per tablet Take 1 tablet by mouth as needed for migraine.     Historical Provider, MD  diazepam (VALIUM) 5 MG tablet Take 5 mg by mouth at bedtime as needed for anxiety.    Historical Provider, MD  diltiazem (CARDIZEM CD) 120 MG 24 hr capsule Take 1 capsule by mouth daily. 05/24/14   Historical Provider, MD  fluticasone (FLONASE) 50 MCG/ACT nasal spray Place 1 spray into both nostrils daily.    Historical Provider, MD  levonorgestrel (MIRENA) 20 MCG/24HR IUD 1 each by Intrauterine route once.    Historical Provider, MD  omeprazole (PRILOSEC) 20 MG capsule Take  20 mg by mouth daily.    Historical Provider, MD  ondansetron (ZOFRAN) 4 MG tablet Take 1 tablet (4 mg total) by mouth every 8 (eight) hours as needed for nausea or vomiting. May take every 6-8 hours as needed 07/19/14   Hendricks Limes, MD   BP 132/75 mmHg  Pulse 79  Temp(Src) 97.9 F (36.6 C) (Oral)  Resp 16  Ht 5\' 5"  (1.651 m)  Wt 140 lb (63.504 kg)  BMI 23.30 kg/m2  SpO2 100% Physical Exam  Constitutional: She is oriented to person, place, and time. She appears well-developed and well-nourished.  HENT:  Head: Normocephalic.  Eyes: EOM are normal.  Neck: Normal range of motion.  Cardiovascular: Normal rate.   Pulmonary/Chest: Effort normal.  Abdominal: Soft. She exhibits no distension.  Musculoskeletal: Normal range of motion.  Neurological:  She is alert and oriented to person, place, and time.  Skin: Skin is warm.  Psychiatric: She has a normal mood and affect.  Nursing note and vitals reviewed.   ED Course  Procedures (including critical care time) Labs Review Labs Reviewed  CBC WITH DIFFERENTIAL/PLATELET - Abnormal; Notable for the following:    RDW 17.1 (*)    All other components within normal limits  COMPREHENSIVE METABOLIC PANEL - Abnormal; Notable for the following:    Chloride 115 (*)    CO2 16 (*)    Creatinine, Ser 1.03 (*)    Calcium 8.6 (*)    ALT 9 (*)    All other components within normal limits  URINALYSIS, ROUTINE W REFLEX MICROSCOPIC (NOT AT [facility]) - Abnormal; Notable for the following:    APPearance CLOUDY (*)    Hgb urine dipstick MODERATE (*)    Leukocytes, UA SMALL (*)    All other components within normal limits  URINE MICROSCOPIC-ADD ON - Abnormal; Notable for the following:    Casts GRANULAR CAST (*)    All other components within normal limits  LIPASE, BLOOD  I-STAT TROPOININ, ED  POC URINE PREG, ED    Imaging Review US Abdomen Complete  08/07/2014   CLINICAL DATA:  Right upper quadrant abdominal pain.  EXAM: ULTRASOUND ABDOMEN COMPLETE  COMPARISON:  None.  FINDINGS: Gallbladder: No gallstones or wall thickening visualized. No sonographic Murphy sign noted.  Common bile duct: Diameter: 2.3 mm, normal.  Liver: No focal lesion identified. Within normal limits in parenchymal echogenicity. Normal directional flow in the main portal vein.  IVC: No abnormality visualized.  Pancreas: Visualized portion unremarkable.  Spleen: Size and appearance within normal limits.  Right Kidney: Length: 10.0 cm. Small nonobstructing shadowing stones, largest in the interpolar region measures 4 mm. Echogenicity within normal limits. No mass or hydronephrosis visualized.  Left Kidney: Length: 10.1 cm. Probable nonobstructing stone in the interpolar region measuring 9 mm. Echogenicity within normal limits. No mass or  hydronephrosis visualized.  Abdominal aorta: No aneurysm visualized.  Other findings: None.  IMPRESSION: 1. Nonobstructing bilateral nephrolithiasis.  No hydronephrosis. 2. Otherwise normal abdominal ultrasound. Normal appearance of the hepatobiliary system.   Electronically Signed   By: Jeb Levering M.D.   On: 08/07/2014 20:23     EKG Interpretation   Date/Time:  Sunday August 07 2014 18:04:13 EDT Ventricular Rate:  89 PR Interval:  141 QRS Duration: 88 QT Interval:  353 QTC Calculation: 429 R Axis:   42 Text Interpretation:  Sinus rhythm Consider right atrial enlargement No  significant change since last tracing Confirmed by Mingo Amber  MD, [place]  ([id number]) on 08/07/2014 6:20:38  PM      MDM urine shows 7-10 wbc, 11-20 rbc's.   I will treat with keflex.    Final diagnoses:  RUQ pain  UTI (lower urinary tract infection)  Constipation, unspecified constipation type        Fransico Meadow, PA-C 08/07/14 2201  Evelina Bucy, MD 08/07/14 2239

## 2014-08-07 NOTE — ED Notes (Signed)
Pt requesting pain medication, PA made aware.  

## 2014-08-07 NOTE — Discharge Instructions (Signed)
Abdominal Pain °Many things can cause abdominal pain. Usually, abdominal pain is not caused by a disease and will improve without treatment. It can often be observed and treated at home. Your health care provider will do a physical exam and possibly order blood tests and X-rays to help determine the seriousness of your pain. However, in many cases, more time must pass before a clear cause of the pain can be found. Before that point, your health care provider may not know if you need more testing or further treatment. °HOME CARE INSTRUCTIONS  °Monitor your abdominal pain for any changes. The following actions may help to alleviate any discomfort you are experiencing: °· Only take over-the-counter or prescription medicines as directed by your health care provider. °· Do not take laxatives unless directed to do so by your health care provider. °· Try a clear liquid diet (broth, tea, or water) as directed by your health care provider. Slowly move to a bland diet as tolerated. °SEEK MEDICAL CARE IF: °· You have unexplained abdominal pain. °· You have abdominal pain associated with nausea or diarrhea. °· You have pain when you urinate or have a bowel movement. °· You experience abdominal pain that wakes you in the night. °· You have abdominal pain that is worsened or improved by eating food. °· You have abdominal pain that is worsened with eating fatty foods. °· You have a fever. °SEEK IMMEDIATE MEDICAL CARE IF:  °· Your pain does not go away within 2 hours. °· You keep throwing up (vomiting). °· Your pain is felt only in portions of the abdomen, such as the right side or the left lower portion of the abdomen. °· You pass bloody or black tarry stools. °MAKE SURE YOU: °· Understand these instructions.   °· Will watch your condition.   °· Will get help right away if you are not doing well or get worse.   °Document Released: 11/07/2004 Document Revised: 02/02/2013 Document Reviewed: 10/07/2012 °ExitCare® Patient Information  ©2015 [employer]. This information is not intended to replace advice given to you by your health care provider. Make sure you discuss any questions you have with your health care provider. °Urinary Tract Infection °Urinary tract infections (UTIs) can develop anywhere along your urinary tract. Your urinary tract is your body's drainage system for removing wastes and extra water. Your urinary tract includes two kidneys, two ureters, a bladder, and a urethra. Your kidneys are a pair of bean-shaped organs. Each kidney is about the size of your fist. They are located below your ribs, one on each side of your spine. °CAUSES °Infections are caused by microbes, which are microscopic organisms, including fungi, viruses, and bacteria. These organisms are so small that they can only be seen through a microscope. Bacteria are the microbes that most commonly cause UTIs. °SYMPTOMS  °Symptoms of UTIs may vary by age and gender of the patient and by the location of the infection. Symptoms in young women typically include a frequent and intense urge to urinate and a painful, burning feeling in the bladder or urethra during urination. Older women and men are more likely to be tired, shaky, and weak and have muscle aches and abdominal pain. A fever may mean the infection is in your kidneys. Other symptoms of a kidney infection include pain in your back or sides below the ribs, nausea, and vomiting. °DIAGNOSIS °To diagnose a UTI, your caregiver will ask you about your symptoms. Your caregiver also will ask to provide a urine sample. The urine sample   will be tested for bacteria and white blood cells. White blood cells are made by your body to help fight infection. °TREATMENT  °Typically, UTIs can be treated with medication. Because most UTIs are caused by a bacterial infection, they usually can be treated with the use of antibiotics. The choice of antibiotic and length of treatment depend on your symptoms and the type of bacteria  causing your infection. °HOME CARE INSTRUCTIONS °· If you were prescribed antibiotics, take them exactly as your caregiver instructs you. Finish the medication even if you feel better after you have only taken some of the medication. °· Drink enough water and fluids to keep your urine clear or pale yellow. °· Avoid caffeine, tea, and carbonated beverages. They tend to irritate your bladder. °· Empty your bladder often. Avoid holding urine for long periods of time. °· Empty your bladder before and after sexual intercourse. °· After a bowel movement, women should cleanse from front to back. Use each tissue only once. °SEEK MEDICAL CARE IF:  °· You have back pain. °· You develop a fever. °· Your symptoms do not begin to resolve within 3 days. °SEEK IMMEDIATE MEDICAL CARE IF:  °· You have severe back pain or lower abdominal pain. °· You develop chills. °· You have nausea or vomiting. °· You have continued burning or discomfort with urination. °MAKE SURE YOU:  °· Understand these instructions. °· Will watch your condition. °· Will get help right away if you are not doing well or get worse. °Document Released: 11/07/2004 Document Revised: 07/30/2011 Document Reviewed: 03/08/2011 °ExitCare® Patient Information ©2015 [employer]. This information is not intended to replace advice given to you by your health care provider. Make sure you discuss any questions you have with your health care provider. ° °

## 2014-08-09 ENCOUNTER — Ambulatory Visit (HOSPITAL_COMMUNITY): Payer: 59 | Admitting: Physical Therapy

## 2014-08-09 DIAGNOSIS — G9600 Cerebrospinal fluid leak, unspecified: Secondary | ICD-10-CM

## 2014-08-09 DIAGNOSIS — R269 Unspecified abnormalities of gait and mobility: Secondary | ICD-10-CM

## 2014-08-09 DIAGNOSIS — Z9889 Other specified postprocedural states: Secondary | ICD-10-CM

## 2014-08-09 DIAGNOSIS — R531 Weakness: Secondary | ICD-10-CM | POA: Diagnosis not present

## 2014-08-09 NOTE — Therapy (Signed)
[facility] [place], [place], [address] Phone: [phone]   Fax:  [phone]  Physical Therapy Treatment  Patient Details  Name: Lorraine Weber MRN: [id number] Date of Birth: 04-May-1980 Referring Provider:  Hendricks Limes, MD  Encounter Date: 08/09/2014      PT End of Session - 08/09/14 1609    Visit Number 4   Number of Visits 16   Date for PT Re-Evaluation 09/10/14   Authorization Type UHC / Medicare   Authorization Time Period 10 visits   Authorization - Visit Number 4   Authorization - Number of Visits 10   PT Start Time 4268   PT Stop Time 1348   PT Time Calculation (min) 45 min   Activity Tolerance Patient tolerated treatment well   Behavior During Therapy [facility] for tasks assessed/performed      Past Medical History  Diagnosis Date  . Migraines   . Von Willebrand disease   . Anemia   . Peptic ulcer disease   . Concussion     x8, last 2010, residual homonymous Hemianopsia  . Migraines   . Mastoiditis   . Nephrolithiasis     X5, Dr Jeffie Pollock  . CSF leak     multiple serum pathes    Past Surgical History  Procedure Laterality Date  . Appendectomy    . Hand tendon surgery      left, complicated by MRSA  . Spinal fluid leak patching      > 10X;[facility]    There were no vitals filed for this visit.  Visit Diagnosis:  CSF leak  H/O laminectomy  Generalized weakness  Abnormality of gait      Subjective Assessment - 08/09/14 1549    Subjective PT states she was in the ED over the weekend due to a kidney infection.  States she is on antibiotics.  currently without pain and feels her pressures are normal.   Currently in Pain? No/denies                         [facility] Adult PT Treatment/Exercise - 08/09/14 1318    Lumbar Exercises: Aerobic   Elliptical nustep 8 minutes LE only hills #3 level 3   Lumbar Exercises: Standing   Scapular Retraction 10 reps   Theraband Level (Scapular  Retraction) Level 2 (Red)   Row 10 reps   Theraband Level (Row) Level 2 (Red)   Shoulder Extension 10 reps   Theraband Level (Shoulder Extension) Level 2 (Red)   Other Standing Lumbar Exercises corner chest stretch 3X20"   Lumbar Exercises: Seated   Other Seated Lumbar Exercises UE flexion and abduction 20 reps   Other Seated Lumbar Exercises thoracic excursion A/P only 10reps UE's crossed, cerivcal retraction   Lumbar Exercises: Supine   Ab Set 10 reps   Glut Set 20 reps   Heel Slides 20 reps   Heel Slides Limitations alternating with TrA contraction   Dead Bug 20 reps   Straight Leg Raise 20 reps   Straight Leg Raises Limitations alternating   Other Supine Lumbar Exercises hip add isometric 20X3" holds   Other Supine Lumbar Exercises SAQ, pelvic tilts 20 reps each                  PT Short Term Goals - 07/12/14 1247    PT SHORT TERM GOAL #1   Title Ambulate 1000 feet without loss of balance or instability for  safety with community mobility.    Time 4   Period Weeks   Status New   PT SHORT TERM GOAL #2   Title Patient to be independent with HEP for lower extremity and core strengthening for ADLs.    Time 2   Period Weeks   Status New           PT Long Term Goals - 07/12/14 1250    PT LONG TERM GOAL #1   Title Patient to ambulate 10 minutes on vairable surfaces for safety wtih community ambulation.   Time 8   Period Weeks   Status New   PT LONG TERM GOAL #2   Title Paient to be independent with HEP as per phase 3 protocol for continued gains upon D/C from PT services.   Time 8   Period Weeks   Status New   PT LONG TERM GOAL #3   Title Patient to demonstrate proper lifting mechanics for home ADLs.    Time 8   Period Weeks   Status New               Plan - 08/09/14 1611    Clinical Impression Statement PRogressed to standing scapular exericses, chest stretches and nustep for increased activity tolerance.    Pt was able to complete all  exercises without complaints or diffiuculties.  Increased all mat actvities to 20 reps except abdominal contractions due to soreness in abdomen from kidney infection.  PT is still trying to contact MD office regarding restrictions at home    PT Next Visit Plan Progress per specific protocol.  Inquire of limitations set by surgical MD.  Add sit to stand next visit and check protocol for further progression.        Problem List Patient Active Problem List   Diagnosis Date Noted  . CSF leak 05/10/2014  . Complicated migraine 33/35/4562  . Hemiplegia, unspecified, affecting nondominant side 01/02/2013  . Sebaceous cyst of right axilla 07/02/2012  . Chronic sinusitis 10/01/2011  . Candida vaginitis 08/22/2010  . Preseptal cellulitis 08/14/2010  . MRSA bacteremia 08/03/2010  . GERD 04/02/2010  . ANEMIA-NOS 11/08/2009  . Von Willebrand's disease 11/08/2009  . PEPTIC ULCER DISEASE 11/08/2009  . Headache, post-traumatic 11/08/2009  . NEPHROLITHIASIS, HX OF 11/08/2009    Teena Irani, PTA/CLT [phone]  08/09/2014, 4:15 PM  [facility] [address] [place], [place], [address] Phone: [phone]   Fax:  [phone]

## 2014-08-11 ENCOUNTER — Encounter (HOSPITAL_COMMUNITY): Payer: 59 | Admitting: Physical Therapy

## 2014-08-11 NOTE — Telephone Encounter (Signed)
Left message for patient to call office.  

## 2014-08-16 ENCOUNTER — Ambulatory Visit (HOSPITAL_COMMUNITY): Payer: 59 | Attending: Internal Medicine | Admitting: Physical Therapy

## 2014-08-16 DIAGNOSIS — Z9889 Other specified postprocedural states: Secondary | ICD-10-CM | POA: Diagnosis present

## 2014-08-16 DIAGNOSIS — R269 Unspecified abnormalities of gait and mobility: Secondary | ICD-10-CM | POA: Diagnosis present

## 2014-08-16 DIAGNOSIS — R531 Weakness: Secondary | ICD-10-CM | POA: Diagnosis present

## 2014-08-16 DIAGNOSIS — G9600 Cerebrospinal fluid leak, unspecified: Secondary | ICD-10-CM

## 2014-08-16 NOTE — Patient Instructions (Signed)
Strengthening: Lateral Bend - Isometric (in Neutral)   Using light pressure from fingertips, press into right temple. Resist bending head sideways. Hold __3-5__ seconds. Repeat 10-20____ times per set. Do __1__ sets per session. Do _1___ sessions per day.  http://[email or web address].us/302   Copyright  VHI. All rights reserved.  Strengthening: Extension - Isometric (in Neutral)   Using light pressure from fingertips at back of head, resist bending head backward. Hold _3___ seconds. Repeat _10-20___ times per set. Do __1__ sets per session. Do 2____ sessions per day.  http://[email or web address].us/308   Copyright  VHI. All rights reserved.  Scapular Retraction (Standing)   With arms at sides, pinch shoulder blades together. Repeat _10-20___ times per set. Do __1__ sets per session. Do ____2 sessions per day.  http://orth.exer.us/944   Copyright  VHI. All rights reserved.  Flexibility: Neck Retraction   Pull head straight back, keeping eyes and jaw level. Repeat _10-20___ times per set. Do ___1_ sets per session. Do2 ____ sessions per day.  http://[email or web address].us/344   Copyright  VHI. All rights reserved.  Strengthening: Isometric Flexion   Using wall for resistance, press right fist into ball using light pressure. Hold _3-5___ seconds. Repeat _10-20___ times per set. Do __1__ sets per session. Do _2___ sessions per day.  http://[email or web address].[email or web address]/800   Copyright  VHI. All rights reserved.  Strengthening: Isometric Extension   Using wall for resistance, press back of left arm into ball using light pressure. Hold _3-5___ seconds. Repeat __10-15__ times per set. Do ___1_ sets per session. Do __2__ sessions per day.  http://[email or web address].us/804   Copyright  VHI. All rights reserved.  Strengthening: Isometric Abduction   Using wall for resistance, press left arm into ball using light pressure. Hold __3-5__ seconds. Repeat __1--20__ times per set. Do __1__ sets per session. Do _1___ sessions  per day.  http://[email or web address].us/806   Copyright  VHI. All rights reserved.  Strengthening: Isometric External Rotation   Using wall to provide resistance, and keeping right arm at side, press back of hand into ball using light pressure. Hold __3-5__ seconds. Repeat _10-15___ times per set. Do __1__ sets per session. Do __2__ sessions per day.  http://[email or web address].us/814   Copyright  VHI. All rights reserved.  Strengthening: Isometric Internal Rotation   Using door frame for resistance, press palm of right hand into ball using light pressure. Keep elbow in at side. Hold 3-5____ seconds. Repeat __10-20__ times per set. Do _1___ sets per session. Do _1___ sessions per day.  http://[email or web address].us/816   Copyright  VHI. All rights reserved.  Strengthening: Shoulder Shrug (Phase 1)   Shrug shoulders up  and backward and relax Repeat _10-20___ times per set. Do ___1_ sets per session. Do ___1_ sessions per day.  http://[email or web address].us/336   Copyright  VHI. All rights reserved.  Opposite Arm / Leg Lift (Prone)   Abdomen and head supported, left knee locked, raise leg and opposite arm _2___ inches from floor. Repeat 15-20____ times per set. Do ___1_ sets per session. Do _1___ sessions per day.  http://[email or web address].us/1114   Copyright  VHI. All rights reserved.  Functional Quadriceps: Chair Squat   Keeping feet flat on floor, shoulder width apart, squat as low as is comfortable. Use support as necessary. Repeat __15-20__ times per set. Do __1__ sets per session. Do ___1_ sessions per day.  http://orth.exer.us/736   Copyright  VHI. All rights reserved.  Strengthening: Hip Adduction - Isometric   With ball or folded pillow between knees, squeeze knees together. Hold __3__ seconds. Repeat _10-20___ times per set. Do __1__ sets  per session. Do __1__ sessions per day.  http://orth.exer.us/612   Copyright  VHI. All rights reserved.  Hip Abduction / Adduction: with Knee Flexion  (Supine)  On your side.  With right knee bent, gentlyraise knee to side and return. Repeat _10-20__ times per set. Do _1___ sets per session. Do _2___ sessions per day.  http://orth.exer.us/682   Copyright  VHI. All rights reserved.  Bent Leg Lift (Hook-Lying)   Tighten stomach and slowly raise right leg _3___ inches from floor. Keep trunk rigid. Hold ___2_ seconds. Repeat _15-20___ times per set. Do ____ sets per session. Do _1___ sessions per day. 1 http://orth.exer.us/1090   Copyright  VHI. All rights reserved.  Forward Lunge   Standing with feet shoulder width apart and stomach tight, step forward with left leg. Repeat __15-20__ times per set. Do ____ sets per session. Do ____1 sessions per day. 1 http://orth.exer.us/1146   Copyright  VHI. All rights reserved.

## 2014-08-16 NOTE — Therapy (Addendum)
[place], [place], [address] Phone: [phone]   Fax:  [phone]  Physical Therapy Treatment  Patient Details  Name: Lorraine Weber MRN: [id number] Date of Birth: February 27, 1980 Referring Provider:  Hendricks Limes, MD  Encounter Date: 08/16/2014      PT End of Session - 08/16/14 1353    Visit Number 5   Number of Visits 16   Date for PT Re-Evaluation 09/10/14   Authorization Type UHC / Medicare   Authorization Time Period 10 visits   Authorization - Visit Number 5   Authorization - Number of Visits 10   PT Start Time 1300   PT Stop Time 7782   PT Time Calculation (min) 47 min      Past Medical History  Diagnosis Date  . Migraines   . Von Willebrand disease   . Anemia   . Peptic ulcer disease   . Concussion     x8, last 2010, residual homonymous Hemianopsia  . Migraines   . Mastoiditis   . Nephrolithiasis     X5, Dr Jeffie Pollock  . CSF leak     multiple serum pathes    Past Surgical History  Procedure Laterality Date  . Appendectomy    . Hand tendon surgery      left, complicated by MRSA  . Spinal fluid leak patching      > 10X;[facility]    There were no vitals filed for this visit.  Visit Diagnosis:  CSF leak  H/O laminectomy  Generalized weakness  Abnormality of gait      Subjective Assessment - 08/16/14 1306    Subjective Pt states that she is walking her dogs and doing her housework now.    Pain Score 2    Pain Location Back   Pain Orientation Lower;Mid;Right;Left               [facility] Adult PT Treatment/Exercise - 08/16/14 0001    Exercises   Exercises Neck;Shoulder   Neck Exercises: Seated   Cervical Isometrics Extension;Right lateral flexion;Left lateral flexion;10 reps   Neck Retraction 15 reps   Shoulder Shrugs 10 reps   Shoulder Rolls 10 reps   Other Seated Exercise shoulder isometric x 15 B all    Lumbar Exercises: Standing   Functional Squats 10 reps   Forward  Lunge 10 reps   Lumbar Exercises: Supine   Bent Knee Raise 10 reps   Other Supine Lumbar Exercises hip add isometric 20X3" holds   Lumbar Exercises: Sidelying   Clam 10 reps   Lumbar Exercises: Prone   Opposite Arm/Leg Raise Right arm/Left leg;Left arm/Right leg;10 reps                PT Education - 08/16/14 1352    Education provided Yes   Education Details for cervical and thoracic exercise progression    Person(s) Educated Patient   Methods Explanation;Demonstration;Handout   Comprehension Verbalized understanding;Returned demonstration          PT Short Term Goals - 07/12/14 1247    PT SHORT TERM GOAL #1   Title Ambulate 1000 feet without loss of balance or instability for safety with community mobility.    Time 4   Period Weeks   Status New   PT SHORT TERM GOAL #2   Title Patient to be independent with HEP for lower extremity and core strengthening for ADLs.    Time 2   Period Weeks   Status New  PT Long Term Goals - 07/12/14 1250    PT LONG TERM GOAL #1   Title Patient to ambulate 10 minutes on vairable surfaces for safety wtih community ambulation.   Time 8   Period Weeks   Status New   PT LONG TERM GOAL #2   Title Paient to be independent with HEP as per phase 3 protocol for continued gains upon D/C from PT services.   Time 8   Period Weeks   Status New   PT LONG TERM GOAL #3   Title Patient to demonstrate proper lifting mechanics for home ADLs.    Time 8   Period Weeks   Status New               Plan - 08/16/14 1354    Clinical Impression Statement Pt shown shld isometric,  supine bent knee raise, sidelying clam; prone oppostite arm/leg exercises.  Pt fatigued with bent knee raise and sidelying clam.  Written instructions given for all exercises. MD states not to deviate from protocol   PT Next Visit Plan   Pt to add standing 4-way hip ( flexion, extension, abduction and adduction with holds as well as supine surratuspunch.   then progress to stage III>         Problem List Patient Active Problem List   Diagnosis Date Noted  . CSF leak 05/10/2014  . Complicated migraine 91/22/5834  . Hemiplegia, unspecified, affecting nondominant side 01/02/2013  . Sebaceous cyst of right axilla 07/02/2012  . Chronic sinusitis 10/01/2011  . Candida vaginitis 08/22/2010  . Preseptal cellulitis 08/14/2010  . MRSA bacteremia 08/03/2010  . GERD 04/02/2010  . ANEMIA-NOS 11/08/2009  . Von Willebrand's disease 11/08/2009  . PEPTIC ULCER DISEASE 11/08/2009  . Headache, post-traumatic 11/08/2009  . NEPHROLITHIASIS, HX OF 11/08/2009     Rayetta Humphrey, PT CLT [phone] 08/16/2014, 1:58 PM  [facility] [address] [place], [place], [address] Phone: [phone]   Fax:  [phone]   PHYSICAL THERAPY DISCHARGE SUMMARY  Visits from Start of Care: 5  Current functional level related to goals / functional outcomes: Unknown pt did not return   Remaining deficits: As  above Education / Equipment: HEP  Plan: Patient agrees to discharge.  Patient goals were partially met. Patient is being discharged due to not returning since the last visit.  ?????  Rayetta Humphrey, [place] CLT [phone]

## 2014-08-17 ENCOUNTER — Ambulatory Visit (INDEPENDENT_AMBULATORY_CARE_PROVIDER_SITE_OTHER): Payer: 59 | Admitting: Internal Medicine

## 2014-08-17 ENCOUNTER — Encounter: Payer: Self-pay | Admitting: Internal Medicine

## 2014-08-17 ENCOUNTER — Ambulatory Visit: Payer: 59 | Admitting: Internal Medicine

## 2014-08-17 ENCOUNTER — Other Ambulatory Visit (INDEPENDENT_AMBULATORY_CARE_PROVIDER_SITE_OTHER): Payer: 59

## 2014-08-17 ENCOUNTER — Ambulatory Visit: Payer: Self-pay | Admitting: Internal Medicine

## 2014-08-17 VITALS — BP 168/96 | HR 117 | Temp 97.9°F | Resp 16 | Wt 136.0 lb

## 2014-08-17 DIAGNOSIS — N200 Calculus of kidney: Secondary | ICD-10-CM | POA: Diagnosis not present

## 2014-08-17 DIAGNOSIS — R1011 Right upper quadrant pain: Secondary | ICD-10-CM

## 2014-08-17 DIAGNOSIS — R8290 Unspecified abnormal findings in urine: Secondary | ICD-10-CM

## 2014-08-17 DIAGNOSIS — R000 Tachycardia, unspecified: Secondary | ICD-10-CM | POA: Diagnosis not present

## 2014-08-17 DIAGNOSIS — R030 Elevated blood-pressure reading, without diagnosis of hypertension: Secondary | ICD-10-CM

## 2014-08-17 LAB — BASIC METABOLIC PANEL
BUN: 11 mg/dL (ref 6–23)
CO2: 24 mEq/L (ref 19–32)
Calcium: 8.8 mg/dL (ref 8.4–10.5)
Chloride: 108 mEq/L (ref 96–112)
Creatinine, Ser: 0.96 mg/dL (ref 0.40–1.20)
GFR: 69.82 mL/min (ref >60.00)
Glucose, Bld: 91 mg/dL (ref 70–99)
Potassium: 3.5 mEq/L (ref 3.5–5.1)
Sodium: 141 mEq/L (ref 135–145)

## 2014-08-17 LAB — HEPATIC FUNCTION PANEL
ALT: 8 U/L (ref 0–35)
AST: 11 U/L (ref 0–37)
Albumin: 4.1 g/dL (ref 3.5–5.2)
Alkaline Phosphatase: 88 U/L (ref 39–117)
Bilirubin, Direct: 0.1 mg/dL (ref 0.0–0.3)
Total Bilirubin: 0.3 mg/dL (ref 0.2–1.2)
Total Protein: 7.1 g/dL (ref 6.0–8.3)

## 2014-08-17 LAB — TSH: TSH: 0.42 u[IU]/mL (ref 0.35–4.50)

## 2014-08-17 LAB — MAGNESIUM: Magnesium: 2 mg/dL (ref 1.5–2.5)

## 2014-08-17 LAB — T4, FREE: Free T4: 0.86 ng/dL (ref 0.60–1.60)

## 2014-08-17 LAB — T3, FREE: T3, Free: 3.8 pg/mL (ref 2.3–4.2)

## 2014-08-17 MED ORDER — METOPROLOL TARTRATE 25 MG PO TABS: 25.0000 mg | ORAL_TABLET | Freq: Two times a day (BID) | ORAL | Status: DC

## 2014-08-17 NOTE — Patient Instructions (Addendum)
Natural interventions to treat or prevent constipation would include drinking to thirst, up to 32 ounces of fluids daily; eating 7-9 servings of fresh fruits or vegetables a day; and increasing roughage in the diet such as whole grains. The OTC  fiber products (Example: Metamucil, etc) can be employed if these natural maneuvers do not correct the issue. Finally MiraLax every third day as needed is an option.  Minimal Blood Pressure Goal= AVERAGE < 140/90;  Ideal is an AVERAGE < 135/85. This AVERAGE should be calculated from @ least 5-7 BP readings taken @ different times of day on different days of week. You should not respond to isolated BP readings , but rather the AVERAGE for that week .Please bring your  blood pressure cuff to office visits to verify that it is reliable.It  can also be checked against the blood pressure device at the pharmacy. Finger or wrist cuffs are not dependable; an arm cuff is.  If all labs normal; Urology referral will be scheduled and you'll be notified of the time.

## 2014-08-17 NOTE — Progress Notes (Signed)
Pre visit review using our clinic review tool, if applicable. No additional management support is needed unless otherwise documented below in the visit note. 

## 2014-08-17 NOTE — Progress Notes (Signed)
   Subjective:    Patient ID: Lorraine Weber, female    DOB: [birth year], 34 y.o.   MRN: [id number]  HPI She was seen in the emergency room 08/07/14 for abdominal pain. Initially it was generalized but radiated to the epigastrium and right upper quadrant. Ultrasound revealed nephrolithiasis without hydronephrosis. The urine did reveal moderate red cells and small leukocytes. It was cloudy. She was placed on antibiotics. No C&S found in Epic system.  She now has right upper quadrant and right flank pain up to 7-8 level. She has no active genitourinary symptoms  She does have some palpitations.  Constipation is a significant issue. She had a bowel movement 4 days ago but prior to that she had constipation for 10 days. She does have nausea for which she's taken Zofran.Marland Kitchen  She was intolerant to Diamox which caused mood changes. This was weaned and she was switched to furosemide twice a day. Neither resulted in any change in urine output.  Her most recent TSH was 0.71 on 07/21/14.   Review of Systems Dysuria, pyuria, hematuria, frequency, nocturia or polyuria are denied. Chest pain, exertional dyspnea, paroxysmal nocturnal dyspnea, or claudication are absent. Unexplained weight loss, significant dyspepsia, dysphagia, melena, rectal bleeding, or persistently small caliber stools are denied.    Objective:   Physical Exam  Pertinent or positive findings include: There is significant tachycardia with slightly irregular rhythm. Her pulse in the hospital ER was 89. There is some tenderness to light percussion over the right flank. She has 1/2+ edema.  General appearance :Thin but adequately nourished; in no distress.  Eyes: No conjunctival inflammation or scleral icterus is present.  Oral exam:  Lips and gums are healthy appearing.There is no oropharyngeal erythema or exudate noted. Dental hygiene is good.  Heart:  No gallop, murmur, click, rub or other extra sounds    Lungs:Chest clear to  auscultation; no wheezes, rhonchi,rales ,or rubs present.No increased work of breathing.   Abdomen: bowel sounds normal, soft and non-tender without masses, organomegaly or hernias noted.  No guarding or rebound.  Vascular : all pulses equal ; no bruits present.  Skin:Warm & dry.  Intact without suspicious lesions or rashes ; no tenting or jaundice   Lymphatic: No lymphadenopathy is noted about the head, neck, axilla  Neuro: Strength, tone normal.        Assessment & Plan:  #1 right upper quadrant abdominal &  R flank pain.  #2 tachycardia  #3 history of nephrolithiasis  #4 abnormal urinalysis  #5 HTN  Plan: See orders and recommendations

## 2014-08-18 ENCOUNTER — Encounter (HOSPITAL_COMMUNITY): Payer: 59 | Admitting: Physical Therapy

## 2014-08-18 LAB — CBC WITH DIFFERENTIAL/PLATELET
Basophils Absolute: 0 10*3/uL (ref 0.0–0.1)
Basophils Relative: 0.2 % (ref 0.0–3.0)
Eosinophils Absolute: 0.1 10*3/uL (ref 0.0–0.7)
Eosinophils Relative: 0.8 % (ref 0.0–5.0)
HCT: 41.8 % (ref 36.0–46.0)
Hemoglobin: 13.8 g/dL (ref 12.0–15.0)
Lymphocytes Relative: 26.5 % (ref 12.0–46.0)
Lymphs Abs: 1.9 10*3/uL (ref 0.7–4.0)
MCHC: 33 g/dL (ref 30.0–36.0)
MCV: 88.5 fl (ref 78.0–100.0)
Monocytes Absolute: 0.3 10*3/uL (ref 0.1–1.0)
Monocytes Relative: 4.7 % (ref 3.0–12.0)
Neutro Abs: 4.9 10*3/uL (ref 1.4–7.7)
Neutrophils Relative %: 67.8 % (ref 43.0–77.0)
Platelets: 241 10*3/uL (ref 150.0–400.0)
RBC: 4.72 Mil/uL (ref 3.87–5.11)
RDW: 17.7 % — ABNORMAL HIGH (ref 11.5–15.5)
WBC: 7.2 10*3/uL (ref 4.0–10.5)

## 2014-08-18 LAB — URINE CULTURE
Colony Count: NO GROWTH
Organism ID, Bacteria: NO GROWTH

## 2014-08-20 ENCOUNTER — Emergency Department (HOSPITAL_COMMUNITY)
Admission: EM | Admit: 2014-08-20 | Discharge: 2014-08-20 | Disposition: A | Discharge status: Home / Self Care | Payer: 59 | Attending: Emergency Medicine | Admitting: Emergency Medicine

## 2014-08-20 ENCOUNTER — Emergency Department (HOSPITAL_COMMUNITY): Payer: 59

## 2014-08-20 ENCOUNTER — Encounter (HOSPITAL_COMMUNITY): Payer: Self-pay

## 2014-08-20 DIAGNOSIS — Z87442 Personal history of urinary calculi: Secondary | ICD-10-CM | POA: Insufficient documentation

## 2014-08-20 DIAGNOSIS — G43909 Migraine, unspecified, not intractable, without status migrainosus: Secondary | ICD-10-CM | POA: Diagnosis not present

## 2014-08-20 DIAGNOSIS — M542 Cervicalgia: Secondary | ICD-10-CM | POA: Insufficient documentation

## 2014-08-20 DIAGNOSIS — Z7951 Long term (current) use of inhaled steroids: Secondary | ICD-10-CM | POA: Diagnosis not present

## 2014-08-20 DIAGNOSIS — Z8711 Personal history of peptic ulcer disease: Secondary | ICD-10-CM | POA: Insufficient documentation

## 2014-08-20 DIAGNOSIS — G960 Cerebrospinal fluid leak: Secondary | ICD-10-CM | POA: Insufficient documentation

## 2014-08-20 DIAGNOSIS — R51 Headache: Secondary | ICD-10-CM | POA: Diagnosis present

## 2014-08-20 DIAGNOSIS — Z79899 Other long term (current) drug therapy: Secondary | ICD-10-CM | POA: Insufficient documentation

## 2014-08-20 DIAGNOSIS — Z720 Tobacco use: Secondary | ICD-10-CM | POA: Diagnosis not present

## 2014-08-20 DIAGNOSIS — Z862 Personal history of diseases of the blood and blood-forming organs and certain disorders involving the immune mechanism: Secondary | ICD-10-CM | POA: Insufficient documentation

## 2014-08-20 DIAGNOSIS — G9601 Cranial cerebrospinal fluid leak, spontaneous: Secondary | ICD-10-CM

## 2014-08-20 LAB — CBC WITH DIFFERENTIAL/PLATELET
Basophils Absolute: 0 10*3/uL (ref 0.0–0.1)
Basophils Relative: 0 % (ref 0–1)
Eosinophils Absolute: 0 10*3/uL (ref 0.0–0.7)
Eosinophils Relative: 1 % (ref 0–5)
HCT: 40.2 % (ref 36.0–46.0)
Hemoglobin: 13.9 g/dL (ref 12.0–15.0)
Lymphocytes Relative: 35 % (ref 12–46)
Lymphs Abs: 1.7 10*3/uL (ref 0.7–4.0)
MCH: 29.8 pg (ref 26.0–34.0)
MCHC: 34.6 g/dL (ref 30.0–36.0)
MCV: 86.3 fL (ref 78.0–100.0)
Monocytes Absolute: 0.7 10*3/uL (ref 0.1–1.0)
Monocytes Relative: 14 % — ABNORMAL HIGH (ref 3–12)
Neutro Abs: 2.5 10*3/uL (ref 1.7–7.7)
Neutrophils Relative %: 50 % (ref 43–77)
Platelets: 238 10*3/uL (ref 150–400)
RBC: 4.66 MIL/uL (ref 3.87–5.11)
RDW: 16.4 % — ABNORMAL HIGH (ref 11.5–15.5)
WBC: 5 10*3/uL (ref 4.0–10.5)

## 2014-08-20 LAB — BASIC METABOLIC PANEL
Anion gap: 8 (ref 5–15)
BUN: 6 mg/dL (ref 6–20)
CO2: 29 mmol/L (ref 22–32)
Calcium: 8.9 mg/dL (ref 8.9–10.3)
Chloride: 103 mmol/L (ref 101–111)
Creatinine, Ser: 0.78 mg/dL (ref 0.44–1.00)
GFR calc Af Amer: >60 mL/min (ref >60)
GFR calc non Af Amer: >60 mL/min (ref >60)
Glucose, Bld: 90 mg/dL (ref 65–99)
Potassium: 3.7 mmol/L (ref 3.5–5.1)
Sodium: 140 mmol/L (ref 135–145)

## 2014-08-20 LAB — PROTIME-INR
INR: 1.19 (ref 0.00–1.49)
Prothrombin Time: 15.2 seconds (ref 11.6–15.2)

## 2014-08-20 MED ORDER — SODIUM CHLORIDE 0.9 % IV BOLUS (SEPSIS)
1000.0000 mL | Freq: Once | INTRAVENOUS | Status: AC
  Administered 2014-08-20: 1000 mL via INTRAVENOUS

## 2014-08-20 MED ORDER — ACETAMINOPHEN 325 MG PO TABS
650.0000 mg | ORAL_TABLET | Freq: Once | ORAL | Status: AC
  Administered 2014-08-20: 650 mg via ORAL
  Filled 2014-08-20: qty 2

## 2014-08-20 MED ORDER — ONDANSETRON HCL 4 MG/2ML IJ SOLN
4.0000 mg | Freq: Once | INTRAMUSCULAR | Status: AC
  Administered 2014-08-20: 4 mg via INTRAVENOUS
  Filled 2014-08-20: qty 2

## 2014-08-20 NOTE — Consult Note (Signed)
Reason for Consult:csf rhinorrhea Referring Physician: Terrica, Duecker is an 34 y.o. female.  HPI: whom was diagnosed with intracranial hypotension. She has a long history of headaches, and has been treated at [facility] by Dr.Gray a neuroradiologist with various blood patches, and fibrin glue patches. After Dr. Pearline Cables felt that she had exhausted her capabilities and sent Ms. Pearline Cables to be treated by Dr. Rondel Oh at [place], [place]. He performed a T11,12 laminectomy, with repair of a ventral dural defect(with a dural patch graft and fibrin glue). Post op she did feel better, she did have weakness in her left lower extremity which has persisted though it is improved.She presents today complaining of feeling a pop in her head, then rhinorrhea which led her to come in to the ED today.   Past Medical History  Diagnosis Date  . Migraines   . Von Willebrand disease   . Anemia   . Peptic ulcer disease   . Concussion     x8, last 2010, residual homonymous Hemianopsia  . Migraines   . Mastoiditis   . Nephrolithiasis     X5, Dr Jeffie Pollock  . CSF leak     multiple serum pathes    Past Surgical History  Procedure Laterality Date  . Appendectomy    . Hand tendon surgery      left, complicated by MRSA  . Spinal fluid leak patching      > 10X;[facility]  . Back surgery      Family History  Problem Relation Age of Onset  . Arthritis Father   . Hyperlipidemia Father   . Diabetes Father   . Stroke Father     PTE post CVA  . Prostate cancer Paternal Grandfather   . Hyperlipidemia Paternal Grandfather   . Diabetes Paternal Grandfather   . Heart disease Mother     ?hypertrophic idiopathic subaortic stenosis  . Heart disease Maternal Grandmother   . Diabetes Maternal Grandmother   . Heart disease Maternal Grandfather     HISS  . Heart disease Maternal Aunt     HISS  . Stroke Maternal Aunt 50  . Heart disease Maternal Uncle   . Heart disease Brother     HISS    Social  History:  reports that she has been smoking Cigarettes.  She has a 3 pack-year smoking history. She has never used smokeless tobacco. She reports that she does not drink alcohol or use illicit drugs.  Allergies:  Allergies  Allergen Reactions  . Aspirin Other (See Comments)    REACTION: ANY BLOOD THINNERS/ DUE TO BLOOD DISORDER-VON WILDEBRAND'S   . Other Other (See Comments)    Pt Instructed by hematologist not to take blood thinners  ALL BLOOD THINNERS-CANNOT TAKE DUE TO [facility]   . Topamax [Topiramate] Other (See Comments)    HYPOTENSION AND SYNCOPE   . Levofloxacin Itching and Rash    Medications: I have reviewed the patient's current medications.  Results for orders placed or performed during the hospital encounter of 08/20/14 (from the past 48 hour(s))  CBC with Differential     Status: Abnormal   Collection Time: 08/20/14  3:56 PM  Result Value Ref Range   WBC 5.0 4.0 - 10.5 K/uL   RBC 4.66 3.87 - 5.11 MIL/uL   Hemoglobin 13.9 12.0 - 15.0 g/dL   HCT 40.2 36.0 - 46.0 %   MCV 86.3 78.0 - 100.0 fL   MCH 29.8 26.0 - 34.0 pg   MCHC  34.6 30.0 - 36.0 g/dL   RDW 16.4 (H) 11.5 - 15.5 %   Platelets 238 150 - 400 K/uL   Neutrophils Relative % 50 43 - 77 %   Neutro Abs 2.5 1.7 - 7.7 K/uL   Lymphocytes Relative 35 12 - 46 %   Lymphs Abs 1.7 0.7 - 4.0 K/uL   Monocytes Relative 14 (H) 3 - 12 %   Monocytes Absolute 0.7 0.1 - 1.0 K/uL   Eosinophils Relative 1 0 - 5 %   Eosinophils Absolute 0.0 0.0 - 0.7 K/uL   Basophils Relative 0 0 - 1 %   Basophils Absolute 0.0 0.0 - 0.1 K/uL  Basic metabolic panel     Status: None   Collection Time: 08/20/14  3:56 PM  Result Value Ref Range   Sodium 140 135 - 145 mmol/L   Potassium 3.7 3.5 - 5.1 mmol/L   Chloride 103 101 - 111 mmol/L   CO2 29 22 - 32 mmol/L   Glucose, Bld 90 65 - 99 mg/dL   BUN 6 6 - 20 mg/dL   Creatinine, Ser 0.78 0.44 - 1.00 mg/dL   Calcium 8.9 8.9 - 10.3 mg/dL   GFR calc non Af Amer >60 >60 mL/min   GFR calc Af  Amer >60 >60 mL/min    Comment: (NOTE) The eGFR has been calculated using the CKD EPI equation. This calculation has not been validated in all clinical situations. eGFR's persistently <60 mL/min signify possible Chronic Kidney Disease.    Anion gap 8 5 - 15  Protime-INR     Status: None   Collection Time: 08/20/14  4:12 PM  Result Value Ref Range   Prothrombin Time 15.2 11.6 - 15.2 seconds   INR 1.19 0.00 - 1.49    Ct Head Wo Contrast  08/20/2014   CLINICAL DATA:  Dripping from the right nostril for 12 hours. Confusion and bilateral eye pain. History of CSF leak. Initial encounter.  EXAM: CT HEAD WITHOUT CONTRAST  TECHNIQUE: Contiguous axial images were obtained from the base of the skull through the vertex without intravenous contrast.  COMPARISON:  CT of the head performed 03/14/2014  FINDINGS: There is no evidence of acute infarction, mass lesion, or intra- or extra-axial hemorrhage on CT.  The posterior fossa, including the cerebellum, brainstem and fourth ventricle, is within normal limits. The third and lateral ventricles, and basal ganglia are unremarkable in appearance. The cerebral hemispheres are symmetric in appearance, with normal gray-white differentiation. No mass effect or midline shift is seen.  There is no evidence of fracture; visualized osseous structures are unremarkable in appearance. The visualized portions of the orbits are within normal limits. The paranasal sinuses and mastoid air cells are well-aerated. The nasal passages are not imaged on this study. No significant soft tissue abnormalities are seen.  IMPRESSION: Unremarkable noncontrast CT of the head.   Electronically Signed   By: Garald Balding M.D.   On: 08/20/2014 19:34    Review of Systems  Constitutional: Positive for malaise/fatigue.  Eyes: Positive for pain.  Respiratory: Negative.   Cardiovascular: Negative.   Gastrointestinal: Negative.   Genitourinary: Negative.   Musculoskeletal: Negative.   Skin:  Negative.   Neurological: Positive for headaches.  Endo/Heme/Allergies: Negative.   Psychiatric/Behavioral: Negative.    Blood pressure 145/95, pulse 95, temperature 98.1 F (36.7 C), temperature source Oral, resp. rate 14, height $RemoveBe'5\' 5"'[id number]$  (1.651 m), weight 61.236 kg (135 lb), SpO2 100 %. Physical Exam  Constitutional: She is oriented to  person, place, and time.  Neurological: She is alert and oriented to person, place, and time. She has normal reflexes. She displays normal reflexes. No cranial nerve deficit. She exhibits normal muscle tone. Gait abnormal. Coordination normal.  Weakness in left lower extremity ~4/5 hip flexors, extensors, quadriceps, hamstrings, dorsiflexors, plantar flexors Mild weakness in left upper extremity~4+/5 Proprioception intact  Skin: Skin is warm and dry.    Assessment/Plan: The head ct today was normal. I believe she can be discharged. She has not been able to demonstrate rhinorrhea since being in the ED.  I have spoken with I believe a resident at [facility], and at this time am still awaiting a call from them.  I will follow up with Ms. Heinzman in the office.  She does not need antibiotics at this time.   Diron Haddon L 08/20/2014, 8:10 PM

## 2014-08-20 NOTE — ED Notes (Signed)
Pt had a back surgery in [place] 10 weeks ago referred through [place]. Was told she might get a rebound headache and may have some CSF leakage. Tried what was told to do and then told to come here if didn't help get rid of headache. Has CSF leaking from right nare and right ear when she lies down. States there is a halo effect on the pillow. Also leaking from the mouth d/t nasal drainage. Has a weird taste. Started with pain to her head and neck a little over 24 hours ago.

## 2014-08-20 NOTE — Discharge Instructions (Signed)
As discussed, your evaluation today has been largely reassuring.  But, it is important that you monitor your condition carefully, and do not hesitate to return to the ED if you develop new, or concerning changes in your condition. ? ?Otherwise, please follow-up with your physician for appropriate ongoing care. ? ?

## 2014-08-20 NOTE — ED Provider Notes (Signed)
CSN: [id number]     Arrival date & time 08/20/14  1420 History   First MD Initiated Contact with Patient 08/20/14 1533     Chief Complaint  Patient presents with  . Headache  . Neck Pain  . 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 She reports initially the chronic CSF leak cause of reduction and once fixed law lead to over secretion and increased interest cerebral pressure. He has had no headaches nausea vomiting fever over the past several days but over the past 24 hours has noticed clear fluid leaking from her nose and her ear.  Reports a sweet taste and metallic taste as well. Discussed with her neurosurgeon and told to come to the emergency department for evaluation. Currently reports mild headaches at this time and residual left-sided weakness from previous surgery  Past Medical History  Diagnosis Date  . Migraines   . Von Willebrand disease   . Anemia   . Peptic ulcer disease   . Concussion     x8, last 2010, residual homonymous Hemianopsia  . Migraines   . Mastoiditis   . Nephrolithiasis     X5, Dr Jeffie Pollock  . CSF leak     multiple serum pathes   Past Surgical History  Procedure Laterality Date  . Appendectomy    . Hand tendon surgery      left, complicated by MRSA  . Spinal fluid leak patching      > 10X;[facility]  . Back surgery     Family History  Problem Relation Age of Onset  . Arthritis Father   . Hyperlipidemia Father   . Diabetes Father   . Stroke Father     PTE post CVA  . Prostate cancer Paternal Grandfather   . Hyperlipidemia Paternal Grandfather   . Diabetes Paternal Grandfather   . Heart disease Mother     ?hypertrophic idiopathic subaortic stenosis  . Heart disease Maternal Grandmother   . Diabetes Maternal Grandmother   . Heart disease Maternal Grandfather     HISS  . Heart disease  Maternal Aunt     HISS  . Stroke Maternal Aunt 50  . Heart disease Maternal Uncle   . Heart disease Brother     HISS   History  Substance Use Topics  . Smoking status: Current Some Day Smoker -- 0.30 packs/day for 10 years    Types: Cigarettes  . Smokeless tobacco: Never Used     Comment: smoked age 24-present, up to 1 ppd.05/24/14 1/2 ppd  . Alcohol Use: No   OB History    No data available     Review of Systems  Constitutional: Negative for fever and chills.  HENT: Positive for congestion. Negative for sore throat.        Clear fluid draining from left ear and nose.  Eyes: Negative for pain.  Respiratory: Negative for cough and shortness of breath.   Cardiovascular: Negative for chest pain and palpitations.  Gastrointestinal: Negative for nausea, vomiting, abdominal pain and diarrhea.  Genitourinary: Negative for dysuria and flank pain.  Musculoskeletal: Negative for back pain and neck pain.  Skin: Negative for rash.  Allergic/Immunologic: Negative.   Neurological: Positive for weakness and headaches. Negative for dizziness, light-headedness and numbness.  Psychiatric/Behavioral: Negative for confusion.      Allergies  Aspirin; Other;  Topamax; and Levofloxacin  Home Medications   Prior to Admission medications   Medication Sig Start Date End Date Taking? Authorizing Provider  acetaminophen (TYLENOL) 650 MG CR tablet Take 650 mg by mouth at bedtime as needed for pain.    Yes Historical Provider, MD  butalbital-acetaminophen-caffeine (ESGIC) 50-325-40 MG per tablet Take 0.25-0.5 tablets by mouth daily as needed for migraine.    Yes Historical Provider, MD  cyclobenzaprine (FLEXERIL) 10 MG tablet Take 5 mg by mouth 2 (two) times a week. On Tuesday and Thursday after physical therapy 07/12/14  Yes Historical Provider, MD  diltiazem (CARDIZEM CD) 120 MG 24 hr capsule Take 120 mg by mouth at bedtime.  05/24/14  Yes Historical Provider, MD  fluticasone (FLONASE) 50 MCG/ACT  nasal spray Place 1 spray into both nostrils daily.   Yes Historical Provider, MD  furosemide (LASIX) 20 MG tablet Take 20 mg by mouth 2 (two) times daily. 08/09/14  Yes Historical Provider, MD  levonorgestrel (MIRENA) 20 MCG/24HR IUD 1 each by Intrauterine route once. Implanted fall 2014   Yes Historical Provider, MD  omeprazole (PRILOSEC) 20 MG capsule Take 20 mg by mouth daily.   Yes Historical Provider, MD  ondansetron (ZOFRAN-ODT) 8 MG disintegrating tablet Take 8 mg by mouth every 6 (six) hours as needed for nausea or vomiting.  07/26/14  Yes Historical Provider, MD  polyethylene glycol (MIRALAX) packet Take 17 g by mouth daily. Patient taking differently: Take 17 g by mouth daily as needed (constipation).  08/07/14  Yes Hollace Kinnier Sofia, PA-C  metoprolol tartrate (LOPRESSOR) 25 MG tablet Take 1 tablet (25 mg total) by mouth 2 (two) times daily. Patient not taking: Reported on 08/20/2014 08/17/14   Hendricks Limes, MD   BP 173/90 mmHg  Pulse 96  Temp(Src) 98 F (36.7 C) (Oral)  Resp 15  Ht 5\' 5"  (1.651 m)  Wt 135 lb (61.236 kg)  BMI 22.47 kg/m2  SpO2 100% Physical Exam  Constitutional: She is oriented to person, place, and time. She appears well-developed and well-nourished. No distress.  HENT:  Head: Normocephalic and atraumatic.  No fluid visualized on examination of nose or ears.    Eyes: Conjunctivae and EOM are normal. Pupils are equal, round, and reactive to light.  Neck: Normal range of motion. Neck supple.  Cardiovascular: Normal rate, regular rhythm and normal heart sounds.   Pulmonary/Chest: Effort normal and breath sounds normal. No respiratory distress.  Abdominal: Soft. Bowel sounds are normal. There is no tenderness.  Musculoskeletal: Normal range of motion.  Neurological: She is alert and oriented to person, place, and time. She has normal reflexes. She displays normal reflexes. No cranial nerve deficit or sensory deficit. She displays a negative Romberg sign. GCS eye  subscore is 4. GCS verbal subscore is 5. GCS motor subscore is 6.  Normal finger to nose bilaterally.  Rapid alternating movements intact bilaterally.  Normal heal to shin bilaterally.    Left sided pronator drift.  Left leg slightly weaker then right.    Skin: Skin is warm and dry. She is not diaphoretic.  Psychiatric: She has a normal mood and affect.    ED Course  Procedures (including critical care time) Labs Review Labs Reviewed  CBC WITH DIFFERENTIAL/PLATELET - Abnormal; Notable for the following:    RDW 16.4 (*)    Monocytes Relative 14 (*)    All other components within normal limits  BASIC METABOLIC PANEL  PROTIME-INR    Imaging Review Ct Head Wo Contrast  08/20/2014   CLINICAL DATA:  Dripping from the right nostril for 12 hours. Confusion and bilateral eye pain. History of CSF leak. Initial encounter.  EXAM: CT HEAD WITHOUT CONTRAST  TECHNIQUE: Contiguous axial images were obtained from the base of the skull through the vertex without intravenous contrast.  COMPARISON:  CT of the head performed 03/14/2014  FINDINGS: There is no evidence of acute infarction, mass lesion, or intra- or extra-axial hemorrhage on CT.  The posterior fossa, including the cerebellum, brainstem and fourth ventricle, is within normal limits. The third and lateral ventricles, and basal ganglia are unremarkable in appearance. The cerebral hemispheres are symmetric in appearance, with normal gray-white differentiation. No mass effect or midline shift is seen.  There is no evidence of fracture; visualized osseous structures are unremarkable in appearance. The visualized portions of the orbits are within normal limits. The paranasal sinuses and mastoid air cells are well-aerated. The nasal passages are not imaged on this study. No significant soft tissue abnormalities are seen.  IMPRESSION: Unremarkable noncontrast CT of the head.   Electronically Signed   By: Garald Balding M.D.   On: 08/20/2014 19:34     EKG  Interpretation None      MDM   Final diagnoses:  Cerebrospinal rhinorrhea   Patient is a 34 year old Caucasian female with a history of CSF leak presented today for clear fluid draining from her nose and ears. Thought to be CSF and advise [facility] emergency [facility] for further evaluation  On initial evaluation patient hemodynamically stable and in no acute distress. Did not visualize any CSF leakage from her nose or her ear area discussed with neuroradiology who advised to consult neurosurgery for possible imaging at this time.  Neurosurgery was consulted and evaluated patient at bedside. Ordered CT head which showed no acute intracranial abnormalities. Neurosurgery felt patient stable to follow-up as an outpatient for further evaluation. No signs of meningismus on exam with no leukocytosis. Do not feel an antibiotic is warranted at this time.  Pt to f/u with neurosurgery as an outpatient and educated on strict return precautions.   If performed, labs, EKGs, and imaging were reviewed/interpreted by myself and my attending and incorporated into medical decision making.  Discussed pertinent finding with patient or caregiver prior to discharge with no further questions.  Immediate return precautions given and pt or caregiver reports understanding.  Pt care supervised by my attending Dr. Vanita Panda.   Geronimo Boot, MD PGY-2  Emergency Medicine     Geronimo Boot, MD 08/21/14 0104  Carmin Muskrat, MD 08/22/14 [phone]

## 2014-08-23 ENCOUNTER — Encounter (HOSPITAL_COMMUNITY): Payer: 59 | Admitting: Physical Therapy

## 2014-08-25 ENCOUNTER — Encounter (HOSPITAL_COMMUNITY): Payer: 59 | Admitting: Physical Therapy

## 2014-08-30 ENCOUNTER — Ambulatory Visit (HOSPITAL_COMMUNITY): Payer: 59 | Admitting: Physical Therapy

## 2014-09-01 ENCOUNTER — Encounter (HOSPITAL_COMMUNITY): Payer: 59 | Admitting: Physical Therapy

## 2014-09-06 ENCOUNTER — Encounter (HOSPITAL_COMMUNITY): Payer: 59 | Admitting: Physical Therapy

## 2014-09-06 DIAGNOSIS — I10 Essential (primary) hypertension: Secondary | ICD-10-CM | POA: Diagnosis not present

## 2014-09-06 DIAGNOSIS — G43909 Migraine, unspecified, not intractable, without status migrainosus: Secondary | ICD-10-CM | POA: Diagnosis not present

## 2014-09-06 DIAGNOSIS — R002 Palpitations: Secondary | ICD-10-CM | POA: Diagnosis not present

## 2014-09-06 DIAGNOSIS — R300 Dysuria: Secondary | ICD-10-CM | POA: Diagnosis not present

## 2014-09-06 DIAGNOSIS — Z6822 Body mass index (BMI) 22.0-22.9, adult: Secondary | ICD-10-CM | POA: Diagnosis not present

## 2014-09-08 ENCOUNTER — Encounter (HOSPITAL_COMMUNITY): Payer: 59 | Admitting: Physical Therapy

## 2014-09-13 ENCOUNTER — Encounter (HOSPITAL_COMMUNITY): Payer: 59 | Admitting: Physical Therapy

## 2014-09-15 ENCOUNTER — Encounter (HOSPITAL_COMMUNITY): Payer: 59 | Admitting: Physical Therapy

## 2014-09-22 DIAGNOSIS — R51 Headache: Secondary | ICD-10-CM | POA: Diagnosis not present

## 2014-09-22 DIAGNOSIS — R112 Nausea with vomiting, unspecified: Secondary | ICD-10-CM | POA: Diagnosis not present

## 2014-09-28 ENCOUNTER — Encounter: Payer: Self-pay | Admitting: Family

## 2014-09-28 ENCOUNTER — Other Ambulatory Visit (HOSPITAL_COMMUNITY): Payer: Self-pay | Admitting: Internal Medicine

## 2014-09-28 ENCOUNTER — Telehealth (HOSPITAL_COMMUNITY): Payer: Self-pay | Admitting: *Deleted

## 2014-09-28 ENCOUNTER — Ambulatory Visit (INDEPENDENT_AMBULATORY_CARE_PROVIDER_SITE_OTHER): Payer: 59 | Admitting: Family

## 2014-09-28 ENCOUNTER — Other Ambulatory Visit: Payer: 59

## 2014-09-28 ENCOUNTER — Other Ambulatory Visit (INDEPENDENT_AMBULATORY_CARE_PROVIDER_SITE_OTHER): Payer: 59

## 2014-09-28 VITALS — BP 140/88 | HR 92 | Temp 97.9°F | Resp 18 | Ht 65.0 in | Wt 135.0 lb

## 2014-09-28 DIAGNOSIS — R300 Dysuria: Secondary | ICD-10-CM

## 2014-09-28 DIAGNOSIS — R002 Palpitations: Secondary | ICD-10-CM

## 2014-09-28 DIAGNOSIS — Z5181 Encounter for therapeutic drug level monitoring: Secondary | ICD-10-CM

## 2014-09-28 DIAGNOSIS — I10 Essential (primary) hypertension: Secondary | ICD-10-CM

## 2014-09-28 LAB — POCT URINALYSIS DIPSTICK
Bilirubin, UA: NEGATIVE
Blood, UA: NEGATIVE
Glucose, UA: NEGATIVE
Ketones, UA: NEGATIVE
Nitrite, UA: NEGATIVE
Spec Grav, UA: 1.025
Urobilinogen, UA: NEGATIVE
pH, UA: 6

## 2014-09-28 LAB — BASIC METABOLIC PANEL
BUN: 15 mg/dL (ref 6–23)
CO2: 28 mEq/L (ref 19–32)
Calcium: 9.4 mg/dL (ref 8.4–10.5)
Chloride: 99 mEq/L (ref 96–112)
Creatinine, Ser: 0.92 mg/dL (ref 0.40–1.20)
GFR: 73.29 mL/min (ref >60.00)
Glucose, Bld: 99 mg/dL (ref 70–99)
Potassium: 3.7 mEq/L (ref 3.5–5.1)
Sodium: 136 mEq/L (ref 135–145)

## 2014-09-28 MED ORDER — SULFAMETHOXAZOLE-TRIMETHOPRIM 800-160 MG PO TABS: 1.0000 | ORAL_TABLET | Freq: Two times a day (BID) | ORAL | Status: DC

## 2014-09-28 MED ORDER — FLUCONAZOLE 150 MG PO TABS: 150.0000 mg | ORAL_TABLET | Freq: Once | ORAL | Status: DC

## 2014-09-28 MED ORDER — PHENAZOPYRIDINE HCL 100 MG PO TABS: 100.0000 mg | ORAL_TABLET | Freq: Three times a day (TID) | ORAL | Status: DC | PRN

## 2014-09-28 NOTE — Progress Notes (Signed)
Pre visit review using our clinic review tool, if applicable. No additional management support is needed unless otherwise documented below in the visit note. 

## 2014-09-28 NOTE — Assessment & Plan Note (Addendum)
In office urinalysis positive for leukocytes and negative for nitrites and hematuria. Urine sent for culture. Symptoms consistent with cystitis. Start Bactrim. Start Pyridium as needed for dysuria. Start Diflucan as needed for her post antibiotic candidiasis. Follow-up if symptoms worsen or fail to improve.

## 2014-09-28 NOTE — Assessment & Plan Note (Signed)
Currently taking Lasix and would like to have her potassium levels checked. Obtain basic metabolic panel. Follow up pending lab work.

## 2014-09-28 NOTE — Patient Instructions (Signed)
Thank you for choosing [facility].  Summary/Instructions:  Your prescription(s) have been submitted to your pharmacy or been printed and provided for you. Please take as directed and contact our office if you believe you are having problem(s) with the medication(s) or have any questions.  Please stop by the lab on the basement level of the building for your blood work. Your results will be released to [facility] (or called to you) after review, usually within 72 hours after test completion. If any changes need to be made, you will be notified at that same time.  If your symptoms worsen or fail to improve, please contact our office for further instruction, or in case of emergency go directly to the emergency room at the closest medical facility.   Urinary Tract Infection Urinary tract infections (UTIs) can develop anywhere along your urinary tract. Your urinary tract is your body's drainage system for removing wastes and extra water. Your urinary tract includes two kidneys, two ureters, a bladder, and a urethra. Your kidneys are a pair of bean-shaped organs. Each kidney is about the size of your fist. They are located below your ribs, one on each side of your spine. CAUSES Infections are caused by microbes, which are microscopic organisms, including fungi, viruses, and bacteria. These organisms are so small that they can only be seen through a microscope. Bacteria are the microbes that most commonly cause UTIs. SYMPTOMS  Symptoms of UTIs may vary by age and gender of the patient and by the location of the infection. Symptoms in young women typically include a frequent and intense urge to urinate and a painful, burning feeling in the bladder or urethra during urination. Older women and men are more likely to be tired, shaky, and weak and have muscle aches and abdominal pain. A fever may mean the infection is in your kidneys. Other symptoms of a kidney infection include pain in your back or sides  below the ribs, nausea, and vomiting. DIAGNOSIS To diagnose a UTI, your caregiver will ask you about your symptoms. Your caregiver also will ask to provide a urine sample. The urine sample will be tested for bacteria and white blood cells. White blood cells are made by your body to help fight infection. TREATMENT  Typically, UTIs can be treated with medication. Because most UTIs are caused by a bacterial infection, they usually can be treated with the use of antibiotics. The choice of antibiotic and length of treatment depend on your symptoms and the type of bacteria causing your infection. HOME CARE INSTRUCTIONS  If you were prescribed antibiotics, take them exactly as your caregiver instructs you. Finish the medication even if you feel better after you have only taken some of the medication.  Drink enough water and fluids to keep your urine clear or pale yellow.  Avoid caffeine, tea, and carbonated beverages. They tend to irritate your bladder.  Empty your bladder often. Avoid holding urine for long periods of time.  Empty your bladder before and after sexual intercourse.  After a bowel movement, women should cleanse from front to back. Use each tissue only once. SEEK MEDICAL CARE IF:   You have back pain.  You develop a fever.  Your symptoms do not begin to resolve within 3 days. SEEK IMMEDIATE MEDICAL CARE IF:   You have severe back pain or lower abdominal pain.  You develop chills.  You have nausea or vomiting.  You have continued burning or discomfort with urination. MAKE SURE YOU:   Understand these  instructions.  Will watch your condition.  Will get help right away if you are not doing well or get worse. Document Released: 11/07/2004 Document Revised: 07/30/2011 Document Reviewed: 03/08/2011 [facility] Patient Information 2015 [place], [place]. This information is not intended to replace advice given to you by your health care provider. Make sure you discuss any  questions you have with your health care provider.

## 2014-09-28 NOTE — Progress Notes (Signed)
Subjective:    Patient ID: Lorraine Weber, female    DOB: [birth year], 34 y.o.   MRN: [id number]  Chief Complaint  Patient presents with  . Dysuria    has lower back pain, dysuria, urinary urgency, x4 days    HPI:  Lorraine Weber is a 34 y.o. female with a PMH of von Willebrand's disease, peptic ulcer, hemiplegia, CSF leak, migraines, and anemia who presents today for an acute office visit.   1.) Urinary symptoms - This is a new problem. Associated symptoms of lower back pain, dysuria and urinary urgency have been going on for about 4 days. Indicates she did have a fever yesterday. Modifying factors include Tylenol which has helped with her fever. Denies recent antibiotic use.  2.) Therapeutic drug monitoring - currently taking Lasix and would like to have her potassium levels checked.   Allergies  Allergen Reactions  . Aspirin Other (See Comments)    REACTION: ANY BLOOD THINNERS/ DUE TO BLOOD DISORDER-VON WILDEBRAND'S   . Other Other (See Comments)    Pt Instructed by hematologist not to take blood thinners  ALL BLOOD THINNERS-CANNOT TAKE DUE TO [facility]   . Topamax [Topiramate] Other (See Comments)    HYPOTENSION AND SYNCOPE   . Levofloxacin Itching and Rash    Current Outpatient Prescriptions on File Prior to Visit  Medication Sig Dispense Refill  . acetaminophen (TYLENOL) 650 MG CR tablet Take 650 mg by mouth at bedtime as needed for pain.     . butalbital-acetaminophen-caffeine (ESGIC) 50-325-40 MG per tablet Take 0.25-0.5 tablets by mouth daily as needed for migraine.     . cyclobenzaprine (FLEXERIL) 10 MG tablet Take 5 mg by mouth 2 (two) times a week. On Tuesday and Thursday after physical therapy  0  . diltiazem (CARDIZEM CD) 120 MG 24 hr capsule Take 120 mg by mouth at bedtime.     . fluticasone (FLONASE) 50 MCG/ACT nasal spray Place 1 spray into both nostrils daily.    . furosemide (LASIX) 20 MG tablet Take 200 mg by mouth 2 (two) times daily.   0  .  levonorgestrel (MIRENA) 20 MCG/24HR IUD 1 each by Intrauterine route once. Implanted fall 2014    . metoprolol tartrate (LOPRESSOR) 25 MG tablet Take 1 tablet (25 mg total) by mouth 2 (two) times daily. 60 tablet 2  . omeprazole (PRILOSEC) 20 MG capsule Take 20 mg by mouth daily.    . ondansetron (ZOFRAN-ODT) 8 MG disintegrating tablet Take 8 mg by mouth every 6 (six) hours as needed for nausea or vomiting.   0  . polyethylene glycol (MIRALAX) packet Take 17 g by mouth daily. (Patient taking differently: Take 17 g by mouth daily as needed (constipation). ) 14 each 0  . [DISCONTINUED] levonorgestrel-ethinyl estradiol (ALTAVERA) 0.15-30 MG-MCG per tablet Take 1 tablet by mouth daily.       No current facility-administered medications on file prior to visit.    Review of Systems  Constitutional: Positive for fever. Negative for chills.  Genitourinary: Positive for dysuria, urgency and flank pain. Negative for frequency.      Objective:    BP 140/88 mmHg  Pulse 92  Temp(Src) 97.9 F (36.6 C) (Oral)  Resp 18  Ht 5\' 5"  (1.651 m)  Wt 135 lb (61.236 kg)  BMI 22.47 kg/m2  SpO2 98% Nursing note and vital signs reviewed.  Physical Exam  Constitutional: She is oriented to person, place, and time. She appears well-developed and well-nourished. No distress.  Cardiovascular: Normal rate, regular rhythm, normal heart sounds and intact distal pulses.   Pulmonary/Chest: Effort normal and breath sounds normal.  Abdominal: There is CVA tenderness.  Neurological: She is alert and oriented to person, place, and time.  Skin: Skin is warm and dry.  Psychiatric: She has a normal mood and affect. Her behavior is normal. Judgment and thought content normal.       Assessment & Plan:   Problem List Items Addressed This Visit      Other   Dysuria - Primary    In office urinalysis positive for leukocytes and negative for nitrites and hematuria. Urine sent for culture. Symptoms consistent with cystitis.  Start Bactrim. Start Pyridium as needed for dysuria. Start Diflucan as needed for her post antibiotic candidiasis. Follow-up if symptoms worsen or fail to improve.      Relevant Orders   POCT urinalysis dipstick (Completed)   Urine culture   Encounter for therapeutic drug monitoring    Currently taking Lasix and would like to have her potassium levels checked. Obtain basic metabolic panel. Follow up pending lab work.      Relevant Orders   Basic Metabolic Panel (BMET)

## 2014-10-01 LAB — URINE CULTURE: Colony Count: 35000

## 2014-10-03 ENCOUNTER — Encounter: Payer: Self-pay | Admitting: Family

## 2014-10-05 ENCOUNTER — Other Ambulatory Visit: Payer: Self-pay

## 2014-10-05 ENCOUNTER — Ambulatory Visit (HOSPITAL_COMMUNITY): Payer: 59 | Attending: Cardiology

## 2014-10-05 DIAGNOSIS — D680 Von Willebrand's disease: Secondary | ICD-10-CM | POA: Diagnosis not present

## 2014-10-05 DIAGNOSIS — R002 Palpitations: Secondary | ICD-10-CM | POA: Diagnosis present

## 2014-10-05 DIAGNOSIS — Z87891 Personal history of nicotine dependence: Secondary | ICD-10-CM | POA: Insufficient documentation

## 2014-10-05 DIAGNOSIS — I351 Nonrheumatic aortic (valve) insufficiency: Secondary | ICD-10-CM | POA: Insufficient documentation

## 2014-10-05 DIAGNOSIS — I10 Essential (primary) hypertension: Secondary | ICD-10-CM | POA: Diagnosis not present

## 2014-10-05 DIAGNOSIS — K219 Gastro-esophageal reflux disease without esophagitis: Secondary | ICD-10-CM | POA: Diagnosis not present

## 2014-10-14 ENCOUNTER — Other Ambulatory Visit (INDEPENDENT_AMBULATORY_CARE_PROVIDER_SITE_OTHER): Payer: 59

## 2014-10-14 ENCOUNTER — Ambulatory Visit (INDEPENDENT_AMBULATORY_CARE_PROVIDER_SITE_OTHER): Payer: 59 | Admitting: Internal Medicine

## 2014-10-14 ENCOUNTER — Encounter: Payer: Self-pay | Admitting: Internal Medicine

## 2014-10-14 VITALS — BP 162/94 | HR 83 | Temp 98.0°F | Resp 16 | Wt 138.0 lb

## 2014-10-14 DIAGNOSIS — I10 Essential (primary) hypertension: Secondary | ICD-10-CM

## 2014-10-14 DIAGNOSIS — G932 Benign intracranial hypertension: Secondary | ICD-10-CM

## 2014-10-14 DIAGNOSIS — R300 Dysuria: Secondary | ICD-10-CM | POA: Diagnosis not present

## 2014-10-14 DIAGNOSIS — R34 Anuria and oliguria: Secondary | ICD-10-CM | POA: Diagnosis not present

## 2014-10-14 DIAGNOSIS — R319 Hematuria, unspecified: Secondary | ICD-10-CM

## 2014-10-14 LAB — POCT URINALYSIS DIPSTICK
Bilirubin, UA: NEGATIVE
Blood, UA: NEGATIVE
Glucose, UA: NEGATIVE
Ketones, UA: NEGATIVE
Leukocytes, UA: NEGATIVE
Nitrite, UA: NEGATIVE
Spec Grav, UA: 1.025
Urobilinogen, UA: 0.2
pH, UA: 6

## 2014-10-14 LAB — BASIC METABOLIC PANEL
BUN: 8 mg/dL (ref 6–23)
CO2: 32 mEq/L (ref 19–32)
Calcium: 9.4 mg/dL (ref 8.4–10.5)
Chloride: 103 mEq/L (ref 96–112)
Creatinine, Ser: 0.8 mg/dL (ref 0.40–1.20)
GFR: 86.09 mL/min (ref >60.00)
Glucose, Bld: 92 mg/dL (ref 70–99)
Potassium: 4.1 mEq/L (ref 3.5–5.1)
Sodium: 141 mEq/L (ref 135–145)

## 2014-10-14 MED ORDER — NITROFURANTOIN MONOHYD MACRO 100 MG PO CAPS: 100.0000 mg | ORAL_CAPSULE | Freq: Two times a day (BID) | ORAL | Status: DC

## 2014-10-14 MED ORDER — PHENAZOPYRIDINE HCL 200 MG PO TABS: 200.0000 mg | ORAL_TABLET | Freq: Three times a day (TID) | ORAL | Status: DC | PRN

## 2014-10-14 NOTE — Progress Notes (Signed)
   Subjective:    Patient ID: Lorraine Weber, female    DOB: December 31, 1980, 34 y.o.   MRN: [id number]  HPI   Her symptoms began 10/10/14 as bilateral flank pain, right greater than left. This is described as constant and throbbing. There was no radiation. There were no associated neuromuscular symptoms  She subsequently developed frequency, oliguria, and scant hematuria. She has associated malaise. She's also had some slight dizziness and presyncope for 2 days.  She is on 200 mg of Lasix a day as 40 mg 2 pills in the morning and evening and one pill midday. This is prescribed for treatment of increased intracranial pressure by her Neurologist at [facility]. Follow-up of her potassium was recommended.  There no BP monitor at home.  Review of Systems She denies pyuria. She has no fever, chills, or sweats.  Fever, chills, sweats, or unexplained weight loss not present. No significant headaches. Mental status change or memory loss denied. Blurred vision , diplopia or vision loss absent. There is no numbness, tingling, or weakness in extremities.   No loss of control of bladder or bowels. Radicular type pain absent.  Chest pain, palpitations, tachycardia, exertional dyspnea, paroxysmal nocturnal dyspnea, claudication or edema are absent.       Objective:   Physical Exam Pertinent or positive findings include: Chronic , stable facial asymmetry.Tongue is moist. There is no tenting. She has some discomfort greater on the right than the left with light percussion over the inferior flanks. She has some discomfort with some suprapubic palpation. Straight leg raising is negative.   General appearance :adequately nourished; in no distress.  Eyes: No conjunctival inflammation or scleral icterus is present.  Oral exam:  Lips and gums are healthy appearing.There is no oropharyngeal erythema or exudate noted. Dental hygiene is good.  Heart:  Normal rate and regular rhythm. S1 and S2 normal without  gallop, murmur, click, rub or other extra sounds    Lungs:Chest clear to auscultation; no wheezes, rhonchi,rales ,or rubs present.No increased work of breathing.   Abdomen: bowel sounds normal, soft and non-tender without masses, organomegaly or hernias noted.  No guarding or rebound.   Vascular : all pulses equal ; no bruits present.  Skin:Warm & dry.  Intact without suspicious lesions or rashes ; no jaundice   Lymphatic: No lymphadenopathy is noted about the head, neck, axilla.   Neuro: Strength, tone & DTRs normal.     Assessment & Plan:  #1 bilateral flank pain  #2dysuria, oliguria, hematuria. Urinalysis is normal but will be cultured  #3 increased intracranial pressure on extremely high doses of Lasix  #4 hypertension.  Plan: See orders and recommendations

## 2014-10-14 NOTE — Patient Instructions (Signed)
Drink as much nondairy fluids as possible. Avoid spicy foods or alcohol as  these may aggravate the bladder. Do not take decongestants. Avoid narcotics if possible.  Minimal Blood Pressure Goal= AVERAGE < 140/90;  Ideal is an AVERAGE < 135/85. This AVERAGE should be calculated from @ least 5-7 BP readings taken @ different times of day on different days of week. You should not respond to isolated BP readings , but rather the AVERAGE for that week .Please bring your  blood pressure cuff to office visits to verify that it is reliable.It  can also be checked against the blood pressure device at the pharmacy. Finger or wrist cuffs are not dependable; an arm cuff is.  If blood pressure averages over 140/90 over the next 7-10 days; increase the metoprolol 25 mg to 1&1/2 pills every 12 hours.

## 2014-10-15 LAB — URINE CULTURE
Colony Count: NO GROWTH
Organism ID, Bacteria: NO GROWTH

## 2014-11-03 DIAGNOSIS — R51 Headache: Secondary | ICD-10-CM | POA: Diagnosis not present

## 2014-11-03 DIAGNOSIS — G960 Cerebrospinal fluid leak: Secondary | ICD-10-CM | POA: Diagnosis not present

## 2014-11-16 ENCOUNTER — Encounter: Payer: Self-pay | Admitting: Internal Medicine

## 2014-11-16 ENCOUNTER — Ambulatory Visit (INDEPENDENT_AMBULATORY_CARE_PROVIDER_SITE_OTHER): Payer: 59 | Admitting: Internal Medicine

## 2014-11-16 VITALS — BP 136/84 | HR 62 | Temp 97.6°F | Resp 16 | Ht 65.0 in | Wt 141.0 lb

## 2014-11-16 DIAGNOSIS — J029 Acute pharyngitis, unspecified: Secondary | ICD-10-CM | POA: Diagnosis not present

## 2014-11-16 DIAGNOSIS — S20419A Abrasion of unspecified back wall of thorax, initial encounter: Secondary | ICD-10-CM

## 2014-11-16 MED ORDER — CEPHALEXIN 500 MG PO CAPS: 500.0000 mg | ORAL_CAPSULE | Freq: Two times a day (BID) | ORAL | Status: DC

## 2014-11-16 NOTE — Progress Notes (Signed)
   Subjective:    Patient ID: Lorraine Weber, female    DOB: [birth year], 34 y.o.   MRN: [id number]  HPI She describes sore throat for 4 days. She's had sores on her back for 2-3 weeks.  The former is associated with periorbital pain/pressure but no purulence. She has some otic pain without discharge. She has some discomfort in the upper teeth and right maxilla.  She's been using a Neti  pot , Flonase & nasal hygiene.  She wants to be evaluated forEDS, which is present in her mother, brother, niece. Apparently this is a connective tissue disorder. Her mother died with unspecified cardiac complications. Her brother has aortic valve disease and aneurysm. The niece is asymptomatic. I told her I would research this and make an appropriate referral.   Review of Systems Extrinsic symptoms of itchy, watery eyes, sneezing, or angioedema are denied.  There is no significant cough, sputum production, wheezing,or  paroxysmal nocturnal dyspnea.    Objective:   Physical Exam  She has scattered circular excoriations over the upper back and lower mid back. There is no associated cellulitis.  General appearance:Adequately nourished; no acute distress or increased work of breathing is present.    Lymphatic: No  lymphadenopathy about the head, neck, or axilla .  Eyes: No conjunctival inflammation or lid edema is present. There is no scleral icterus.OS ptosis.  Ears:  External ear exam shows no significant lesions or deformities.  Otoscopic examination reveals clear canals, tympanic membranes are intact bilaterally without bulging, retraction, inflammation or discharge.  Nose:  External nasal examination shows no deformity or inflammation. Nasal mucosa are pink and moist without lesions or exudates No septal dislocation or deviation.No obstruction to airflow.   Oral exam: Dental hygiene is good; lips and gums are healthy appearing.There is no oropharyngeal erythema or exudate .  Neck:  No deformities,  thyromegaly, masses, or tenderness noted.   Supple with full range of motion without pain.   Heart:  Normal rate and regular rhythm. S1 and S2 normal without gallop, murmur, click, rub or other extra sounds.   Lungs:Chest clear to auscultation; no wheezes, rhonchi,rales ,or rubs present.  Extremities:  No cyanosis, edema, or clubbing  noted    Skin: Warm & dry w/o tenting or jaundice. No significant lesions or rash.       Assessment & Plan:  #1 pharyngitis with negative Centor criteria  #2 excoriations most likely related to nocturnal itching  #3 possible genetic risk for EDS  Plan see orders recommendations

## 2014-11-16 NOTE — Progress Notes (Signed)
Pre visit review using our clinic review tool, if applicable. No additional management support is needed unless otherwise documented below in the visit note. 

## 2014-11-16 NOTE — Patient Instructions (Signed)
Continue the nasal hygiene as follows: Plain Mucinex (NOT D) for thick secretions ;force NON dairy fluids .   Nasal cleansing in the shower as discussed with lather of mild shampoo.After 10 seconds wash off lather while  exhaling through nostrils. Make sure that all residual soap is removed to prevent irritation.  Fluticasone 1 spray in each nostril twice a day as needed. Use the "crossover" technique into opposite nostril spraying toward opposite ear @ 45 degree angle, not straight up into nostril.  Use a Neti pot daily only  as needed for significant sinus congestion; going from open side to congested side . Zyrtec 10 mg @ bedtime  may help preventive nocturnal itching.     Zicam Melts or Zinc lozenges as per package label for sore throat .  Complementary options to boost immunity include  vitamin C 2000 mg daily; & Echinacea for 4-7 days.  Fill the  prescription for antibiotic if fever; discolored nasal or chest secretions; or frontal headache or facial pain  present

## 2014-11-17 ENCOUNTER — Encounter: Payer: Self-pay | Admitting: Internal Medicine

## 2014-12-04 IMAGING — CR DG FOOT COMPLETE 3+V*L*
3 series · 3 of 3 positions shown · non-contrast
Comparison: None.

CLINICAL DATA: Fall.

EXAM:
LEFT FOOT - COMPLETE 3+ VIEW

[view not recorded (1 of 3)]
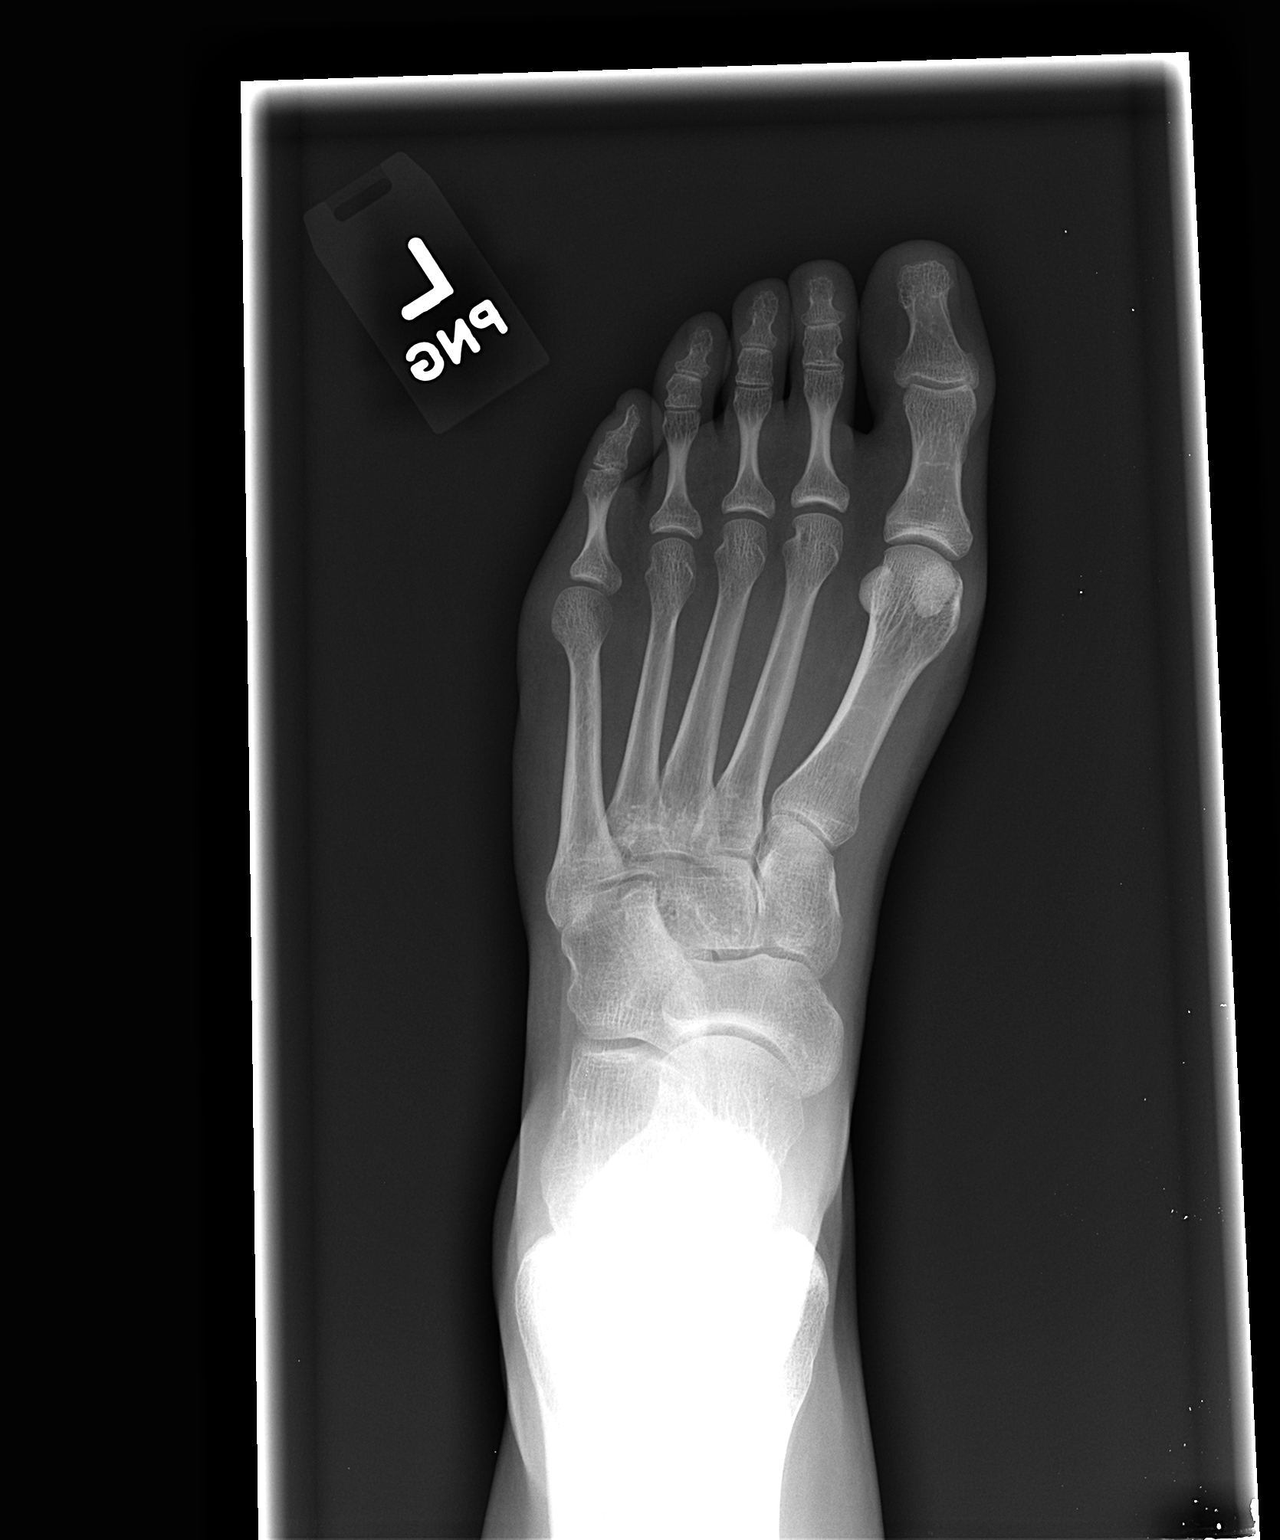

[view not recorded (2 of 3)]
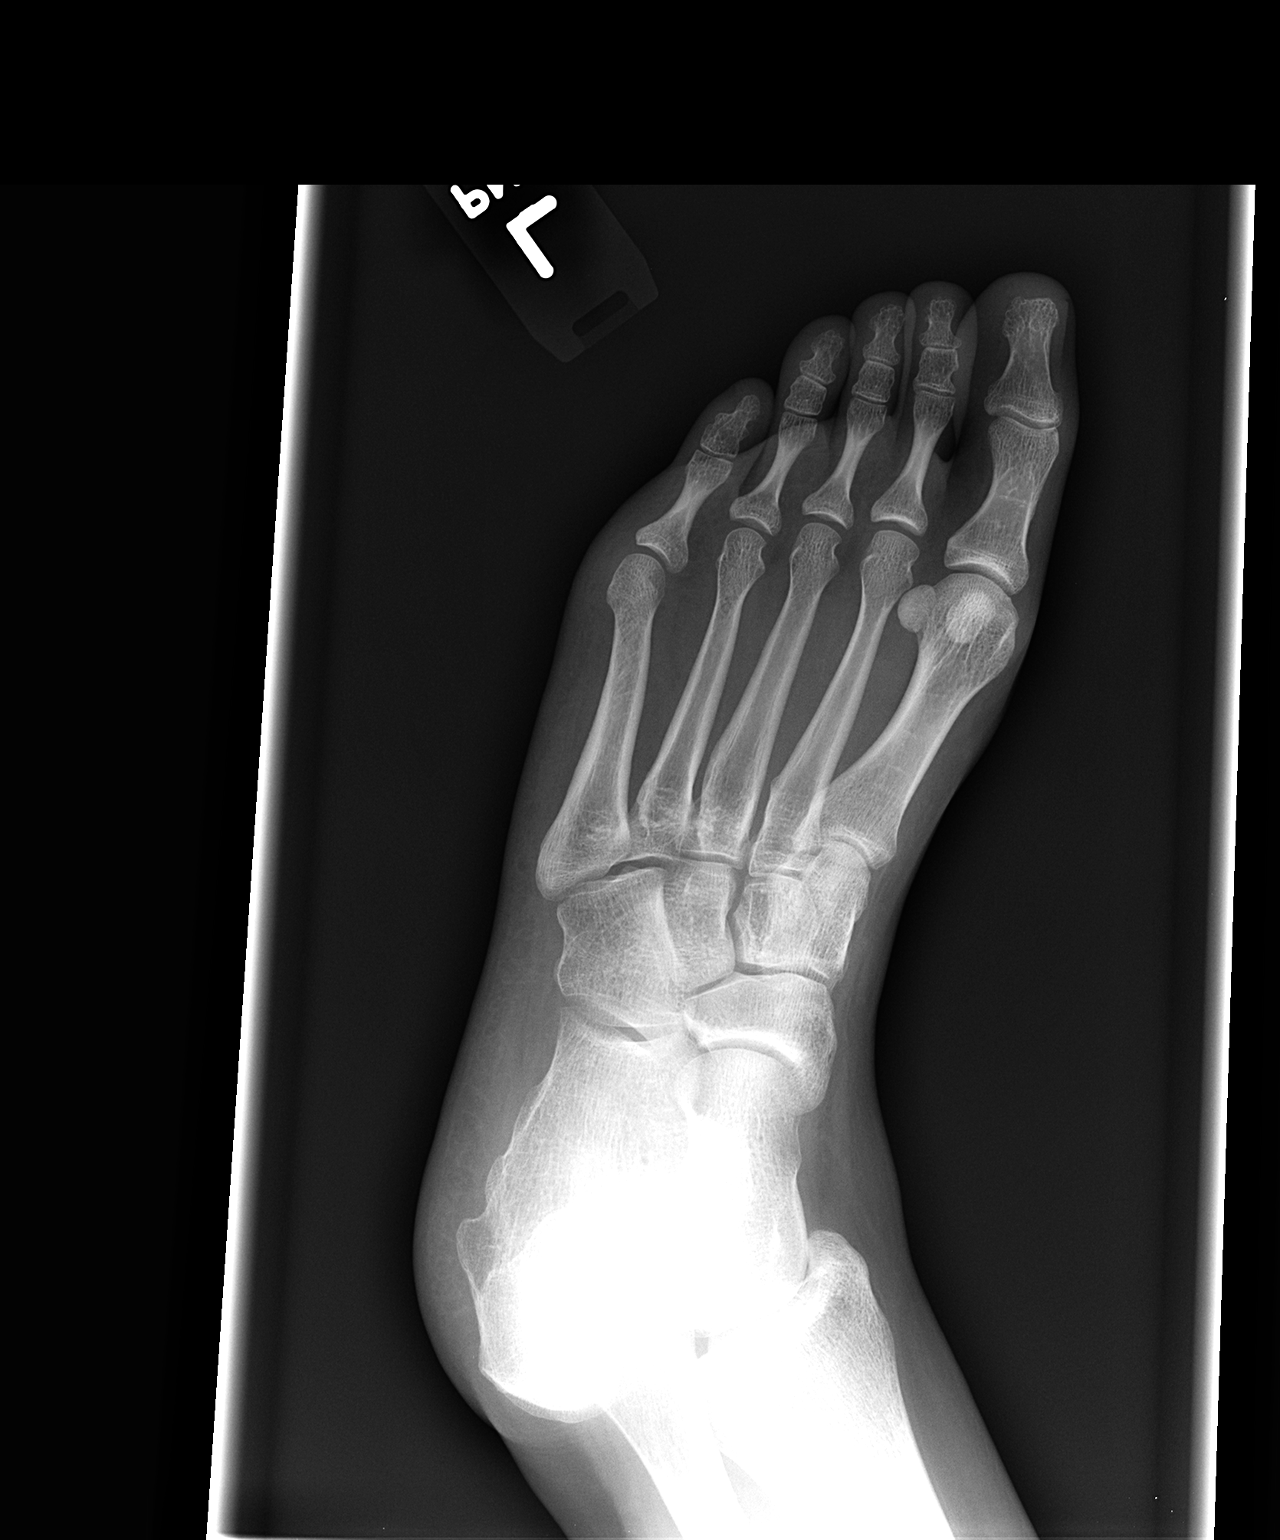

[view not recorded (3 of 3)]
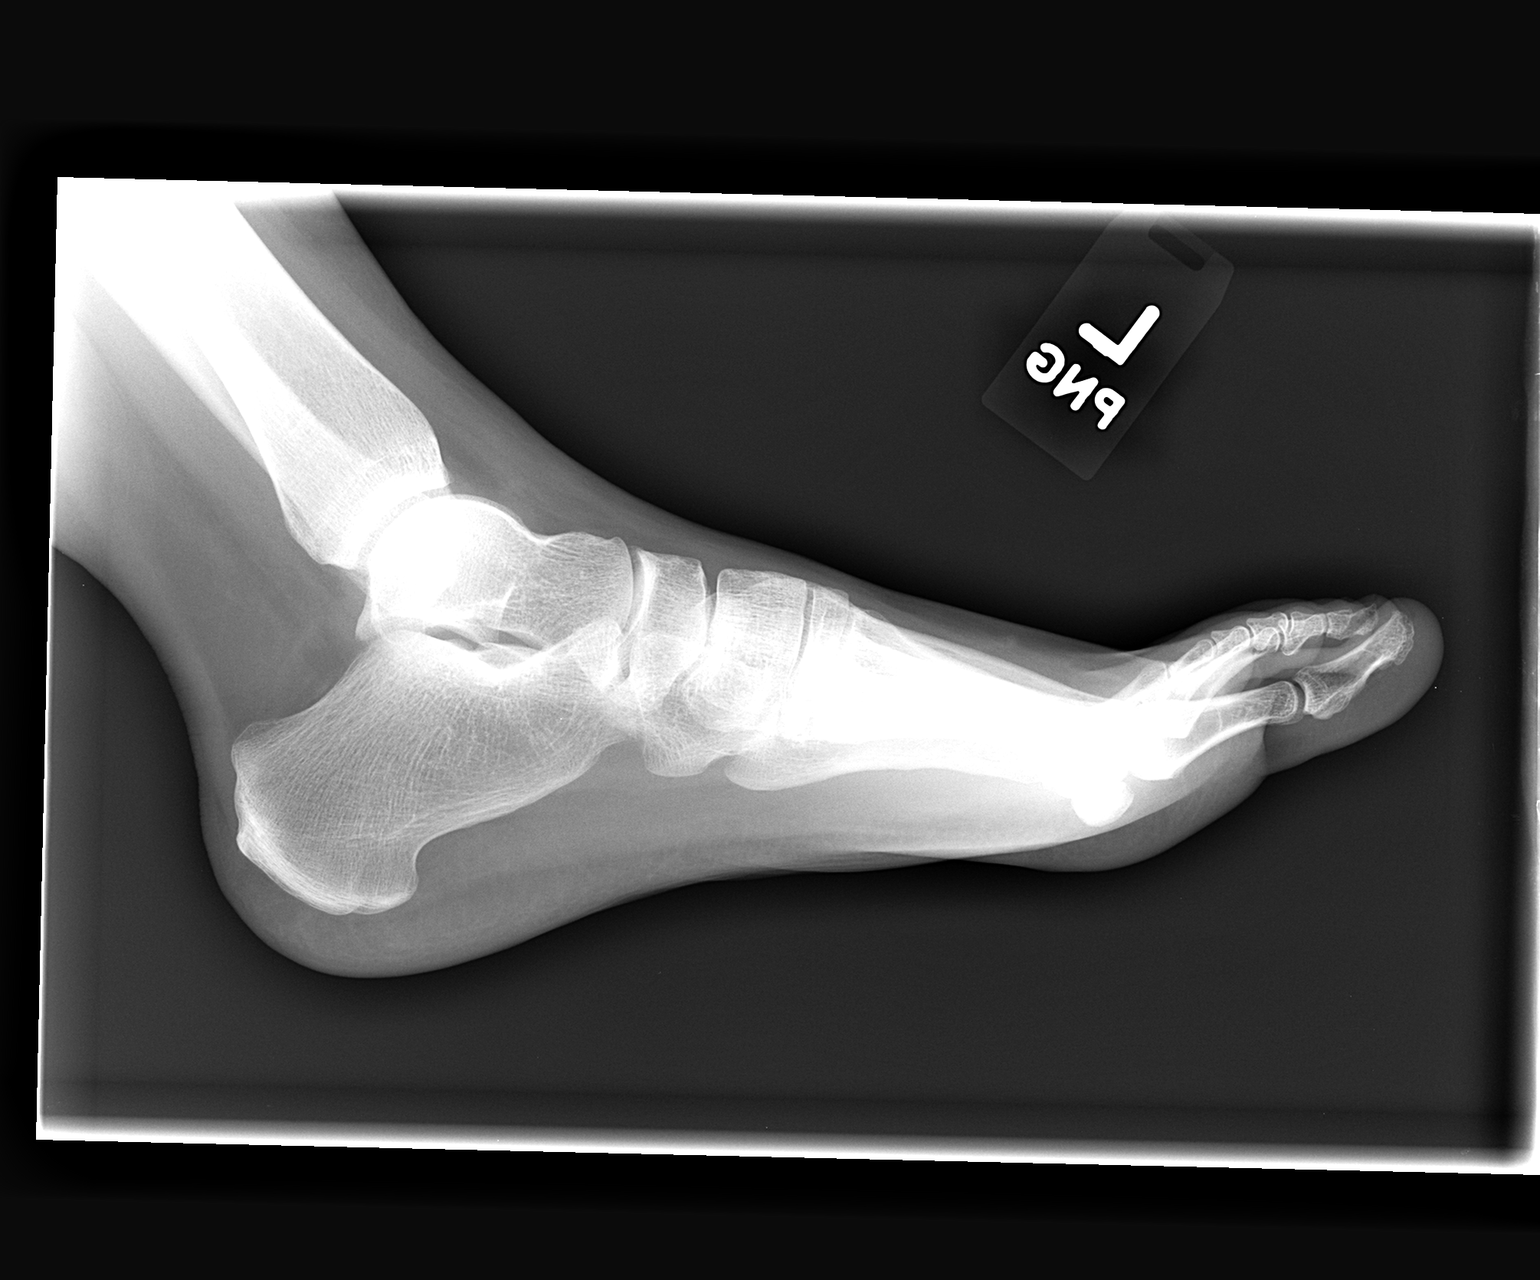

[3 of 3 positions shown; findings below may reference images not displayed]

FINDINGS: No acute bony or joint abnormalities.  No fracture or dislocation.
IMPRESSION: No acute abnormality.

## 2014-12-17 ENCOUNTER — Other Ambulatory Visit: Payer: Self-pay

## 2014-12-17 DIAGNOSIS — R000 Tachycardia, unspecified: Secondary | ICD-10-CM

## 2014-12-17 DIAGNOSIS — R030 Elevated blood-pressure reading, without diagnosis of hypertension: Secondary | ICD-10-CM

## 2014-12-17 MED ORDER — METOPROLOL TARTRATE 25 MG PO TABS: 25.0000 mg | ORAL_TABLET | Freq: Two times a day (BID) | ORAL | Status: DC

## 2014-12-26 ENCOUNTER — Other Ambulatory Visit (HOSPITAL_COMMUNITY): Payer: Self-pay | Admitting: Internal Medicine

## 2014-12-26 ENCOUNTER — Ambulatory Visit (HOSPITAL_COMMUNITY)
Admission: RE | Admit: 2014-12-26 | Discharge: 2014-12-26 | Disposition: A | Discharge status: Home / Self Care | Payer: 59 | Source: Ambulatory Visit | Attending: Internal Medicine | Admitting: Internal Medicine

## 2014-12-26 DIAGNOSIS — M542 Cervicalgia: Secondary | ICD-10-CM

## 2014-12-26 DIAGNOSIS — G971 Other reaction to spinal and lumbar puncture: Secondary | ICD-10-CM | POA: Diagnosis not present

## 2014-12-26 DIAGNOSIS — L700 Acne vulgaris: Secondary | ICD-10-CM | POA: Diagnosis not present

## 2014-12-26 DIAGNOSIS — R079 Chest pain, unspecified: Secondary | ICD-10-CM

## 2014-12-26 DIAGNOSIS — M50322 Other cervical disc degeneration at C5-C6 level: Secondary | ICD-10-CM | POA: Diagnosis not present

## 2014-12-26 DIAGNOSIS — S20211A Contusion of right front wall of thorax, initial encounter: Secondary | ICD-10-CM | POA: Diagnosis not present

## 2014-12-26 DIAGNOSIS — M25511 Pain in right shoulder: Secondary | ICD-10-CM | POA: Insufficient documentation

## 2014-12-26 DIAGNOSIS — Z6823 Body mass index (BMI) 23.0-23.9, adult: Secondary | ICD-10-CM | POA: Diagnosis not present

## 2014-12-26 DIAGNOSIS — Z1389 Encounter for screening for other disorder: Secondary | ICD-10-CM | POA: Diagnosis not present

## 2014-12-26 DIAGNOSIS — I10 Essential (primary) hypertension: Secondary | ICD-10-CM | POA: Diagnosis not present

## 2015-01-26 DIAGNOSIS — R51 Headache: Secondary | ICD-10-CM | POA: Diagnosis not present

## 2015-01-26 DIAGNOSIS — M50222 Other cervical disc displacement at C5-C6 level: Secondary | ICD-10-CM | POA: Diagnosis not present

## 2015-01-30 ENCOUNTER — Ambulatory Visit (INDEPENDENT_AMBULATORY_CARE_PROVIDER_SITE_OTHER): Payer: 59 | Admitting: Internal Medicine

## 2015-01-30 ENCOUNTER — Encounter: Payer: Self-pay | Admitting: Internal Medicine

## 2015-01-30 ENCOUNTER — Other Ambulatory Visit: Payer: 59

## 2015-01-30 VITALS — BP 136/82 | HR 58 | Temp 98.4°F | Resp 20 | Ht 65.0 in | Wt 149.2 lb

## 2015-01-30 DIAGNOSIS — R309 Painful micturition, unspecified: Secondary | ICD-10-CM | POA: Diagnosis not present

## 2015-01-30 DIAGNOSIS — G9600 Cerebrospinal fluid leak, unspecified: Secondary | ICD-10-CM

## 2015-01-30 DIAGNOSIS — J028 Acute pharyngitis due to other specified organisms: Secondary | ICD-10-CM

## 2015-01-30 DIAGNOSIS — R404 Transient alteration of awareness: Secondary | ICD-10-CM

## 2015-01-30 DIAGNOSIS — J029 Acute pharyngitis, unspecified: Secondary | ICD-10-CM

## 2015-01-30 DIAGNOSIS — B9789 Other viral agents as the cause of diseases classified elsewhere: Secondary | ICD-10-CM

## 2015-01-30 DIAGNOSIS — R4020 Unspecified coma: Secondary | ICD-10-CM

## 2015-01-30 LAB — POCT URINALYSIS DIPSTICK
Bilirubin, UA: NEGATIVE
Glucose, UA: NEGATIVE
Ketones, UA: NEGATIVE
Leukocytes, UA: NEGATIVE
Nitrite, UA: NEGATIVE
Protein, UA: NEGATIVE
Spec Grav, UA: 1.025
Urobilinogen, UA: 0.2
pH, UA: 5.5

## 2015-01-30 NOTE — Progress Notes (Signed)
Pre visit review using our clinic review tool, if applicable. No additional management support is needed unless otherwise documented below in the visit note. 

## 2015-01-30 NOTE — Patient Instructions (Signed)
The Neurology referral with Dr Ernst Bowler will be scheduled and you'll be notified of the time.Please call the Referral Co-Ordinator @ [phone] if you have not been notified of appointment time within 7-10 days.  Plain Mucinex (NOT D) for thick secretions ;force NON dairy fluids .   Nasal cleansing in the shower as discussed with lather of mild shampoo.After 10 seconds wash off lather while  exhaling through nostrils. Make sure that all residual soap is removed to prevent irritation.  Flonase OR Nasacort AQ 1 spray in each nostril twice a day as needed. Use the "crossover" technique into opposite nostril spraying toward opposite ear @ 45 degree angle, not straight up into nostril.  Plain Allegra (NOT D )  160 daily , Loratidine 10 mg , OR Zyrtec 10 mg @ bedtime  as needed for itchy eyes & sneezing.  Zicam Melts or Zinc lozenges as per package label for sore throat .  Complementary options to boost immunity include  vitamin C 2000 mg daily; & Echinacea for 4-7 days.

## 2015-01-30 NOTE — Progress Notes (Signed)
   Subjective:    Patient ID: Lorraine Weber, female    DOB: Jan 17, 1981, 34 y.o.   MRN: [id number]  HPI  One week ago she was preparing to move one of their pet dogs into the bed room when she apparently lost consciousness. There was no cardiac or neurologic prodrome prior to the event. Her husband found her lying on the floor on her back with her arms extended beside her body and  her knees bent. There was no stigmata of seizure documented.  She was seen at [facility] in the Neuroradiology department after the event for a scheduled  Myelogram.That apparently revealed T4 disc calcification & disc bulging with some fragments abutting the dura.  Additionally she describes frequency and burning for 2 days. She also has polyuria.  She's had a sore throat for 2 days without other upper or lower respiratory tract symptoms.  Review of Systems Denied were any change in heart rhythm or rate prior to the event. There was no associated chest pain or shortness of breath .  Also specifically denied prior to the episode were headache, limb weakness, tingling, or numbness. No seizure activity noted.  Frontal headache, facial pain , nasal purulence, dental pain,  otic pain or otic discharge denied. No fever , chills or sweats.  Pyuria, hematuria, nocturia or polyuria are denied.    Objective:   Physical Exam  Pertinent or positive findings include: There is ptosis of the left eye. There is erythema of the nasal mucosa. There is decreased sensation to touch over the left face. There is loss of lateral field of vision of the left eye.Gait and balance are normal to include tiptoe and heel walking          General appearance :adequately nourished; in no distress.  Eyes: No conjunctival inflammation or scleral icterus is present.  Oral exam:  Lips and gums are healthy appearing.There is no oropharyngeal erythema or exudate noted. Dental hygiene is good.  Heart:  Normal rate and regular rhythm. S1 and S2  normal without gallop, murmur, click, rub or other extra sounds    Lungs:Chest clear to auscultation; no wheezes, rhonchi,rales ,or rubs present.No increased work of breathing.   Abdomen: bowel sounds normal, soft and non-tender without masses, organomegaly or hernias noted.  No guarding or rebound.   Vascular : all pulses equal ; no bruits present.  Skin:Warm & dry.  Intact without suspicious lesions or rashes ; no tenting or jaundice   Lymphatic: No lymphadenopathy is noted about the head, neck, axilla.   Neuro: Strength, tone & DTRs normal.Oriented X 3.     Assessment & Plan:  #1 loss of consciousness; rule out atypical seizure  #2 CSF leak; as per [facility]  #3 sore throat with no suggestion clinically of strep based on Centor criteria    #4 frequency, dysuria, and polyuria. Urine C&S collected

## 2015-01-31 LAB — URINE CULTURE
Colony Count: NO GROWTH
Organism ID, Bacteria: NO GROWTH

## 2015-02-01 ENCOUNTER — Encounter: Payer: Self-pay | Admitting: Internal Medicine

## 2015-02-07 ENCOUNTER — Telehealth: Payer: Self-pay

## 2015-02-07 MED ORDER — AMOXICILLIN 500 MG PO CAPS: 500.0000 mg | ORAL_CAPSULE | Freq: Three times a day (TID) | ORAL | Status: DC

## 2015-02-07 NOTE — Telephone Encounter (Signed)
Message found. abx sent to local rx.

## 2015-02-07 NOTE — Telephone Encounter (Signed)
FW: Non-Urgent Medical Question     Hendricks Limes, MD    Sent: Wed February 01, 2015 3:32 PM    To: Riley Kill, CMA        Message     Amoxicillin 500 mg tid #21 please     ----- Message -----     From: Lyman Bishop, CMA     Sent: 02/01/2015  3:06 PM      To: Hendricks Limes, MD    Subject: Melton Alar: Non-Urgent Medical Question                       ----- Message -----     From: [name]     Sent: 02/01/2015  3:00 PM      To: [facility]    Subject: Non-Urgent Medical Question                 Dear Dr. Linus Orn,        Thank you for the test results. Yesterday and today I have had colored nasal discharge and a cough. Also, my right sinuses hurt-above the eye and below the eye. The top of my jaw and upper row of teeth also hurt. I have been following your instrutions on my sore throats and have been cleaning my nose also as instructed. In general I am also feeling generally achy. Sorry to bother you. Thank you for your time.     Lorraine Weber    FW: Non-Urgent Medical Question     Hendricks Limes, MD    Sent: Wed February 01, 2015 3:32 PM    To: Riley Kill, CMA        Message     Amoxicillin 500 mg tid #21 please     ----- Message -----     From: Lyman Bishop, CMA     Sent: 02/01/2015  3:06 PM      To: Hendricks Limes, MD    Subject: Melton Alar: Non-Urgent Medical Question                       ----- Message -----     From: [name]     Sent: 02/01/2015  3:00 PM      To: [facility]    Subject: Non-Urgent Medical Question                 Dear Dr. Linus Orn,        Thank you for the test results. Yesterday and today I have had colored nasal discharge and a cough. Also, my right sinuses hurt-above the eye and below the eye. The top of my jaw and upper row of teeth also hurt. I have been  following your instrutions on my sore throats and have been cleaning my nose also as instructed. In general I am also feeling generally achy. Sorry to bother you. Thank you for your time.     Lorraine Weber

## 2015-02-12 HISTORY — PX: SPINAL FUSION: SHX223

## 2015-02-14 ENCOUNTER — Emergency Department (HOSPITAL_COMMUNITY)
Admission: EM | Admit: 2015-02-14 | Discharge: 2015-02-14 | Disposition: A | Discharge status: Home / Self Care | Payer: 59 | Attending: Emergency Medicine | Admitting: Emergency Medicine

## 2015-02-14 ENCOUNTER — Encounter (HOSPITAL_COMMUNITY): Payer: Self-pay | Admitting: *Deleted

## 2015-02-14 DIAGNOSIS — Z7951 Long term (current) use of inhaled steroids: Secondary | ICD-10-CM | POA: Diagnosis not present

## 2015-02-14 DIAGNOSIS — R51 Headache: Secondary | ICD-10-CM | POA: Diagnosis not present

## 2015-02-14 DIAGNOSIS — Z87442 Personal history of urinary calculi: Secondary | ICD-10-CM | POA: Insufficient documentation

## 2015-02-14 DIAGNOSIS — R0602 Shortness of breath: Secondary | ICD-10-CM | POA: Diagnosis not present

## 2015-02-14 DIAGNOSIS — Z792 Long term (current) use of antibiotics: Secondary | ICD-10-CM | POA: Insufficient documentation

## 2015-02-14 DIAGNOSIS — Z79899 Other long term (current) drug therapy: Secondary | ICD-10-CM | POA: Insufficient documentation

## 2015-02-14 DIAGNOSIS — Z8669 Personal history of other diseases of the nervous system and sense organs: Secondary | ICD-10-CM | POA: Insufficient documentation

## 2015-02-14 DIAGNOSIS — Z8711 Personal history of peptic ulcer disease: Secondary | ICD-10-CM | POA: Diagnosis not present

## 2015-02-14 DIAGNOSIS — R110 Nausea: Secondary | ICD-10-CM | POA: Insufficient documentation

## 2015-02-14 DIAGNOSIS — R231 Pallor: Secondary | ICD-10-CM | POA: Insufficient documentation

## 2015-02-14 DIAGNOSIS — Z862 Personal history of diseases of the blood and blood-forming organs and certain disorders involving the immune mechanism: Secondary | ICD-10-CM | POA: Diagnosis not present

## 2015-02-14 DIAGNOSIS — Z87891 Personal history of nicotine dependence: Secondary | ICD-10-CM | POA: Diagnosis not present

## 2015-02-14 DIAGNOSIS — Z8679 Personal history of other diseases of the circulatory system: Secondary | ICD-10-CM | POA: Insufficient documentation

## 2015-02-14 DIAGNOSIS — R21 Rash and other nonspecific skin eruption: Secondary | ICD-10-CM | POA: Diagnosis not present

## 2015-02-14 DIAGNOSIS — M542 Cervicalgia: Secondary | ICD-10-CM | POA: Diagnosis not present

## 2015-02-14 DIAGNOSIS — G960 Cerebrospinal fluid leak: Secondary | ICD-10-CM | POA: Diagnosis not present

## 2015-02-14 NOTE — ED Provider Notes (Signed)
CSN: [id number]     Arrival date & time 02/14/15  2156 History  By signing my name below, I, [name], attest that this documentation has been prepared under the direction and in the presence of Pattricia Boss, MD. Electronically Signed: [name], ED Scribe. 02/14/2015. 10:15 PM.    Chief Complaint  Patient presents with  . Allergic Reaction   Patient is a 35 y.o. female presenting with allergic reaction. The history is provided by the patient. No language interpreter was used.  Allergic Reaction Presenting symptoms: difficulty breathing and rash   Presenting symptoms: no difficulty swallowing and no wheezing   Difficulty breathing:    Severity:  Mild Rash:    Location:  Chest   Quality: redness     Severity:  Moderate   Onset quality:  Gradual   Timing:  Sporadic   Progression:  Unchanged Severity:  Mild Prior allergic episodes:  No prior episodes Context comment:  Surgery and epidural this morning Relieved by:  Antihistamines Exacerbated by: touch. Ineffective treatments:  Rest   HPI Comments: Lorraine Weber is a 35 y.o. female with h/o Von Willebrand disease, anemia, peptic ulcer disease, anemia, concussion, migraines, mastoiditis, CSF leak who presents to the Emergency Department complaining of moderate, intermittent rash to the chest onset tonight with associated SOB, nausea. She notes that she took 25 mg Benadryl 10 minutes ago with no relief. Pt states she had multiple blood patches for CSF leaks at the T spine and an epidural today at [facility] this morning at 11AM. She states she was not sent home on any new medications. She denies emesis, diarrhea.   Past Medical History  Diagnosis Date  . Migraines   . Von Willebrand disease ([place])   . Anemia   . Peptic ulcer disease   . Concussion     x8, last 2010, residual homonymous Hemianopsia  . Migraines   . Mastoiditis   . Nephrolithiasis     X5, Dr Jeffie Pollock  . CSF leak     multiple serum pathes   Past Surgical History   Procedure Laterality Date  . Appendectomy    . Hand tendon surgery      left, complicated by MRSA  . Spinal fluid leak patching      > 10X;[facility]  . Back surgery     Family History  Problem Relation Age of Onset  . Arthritis Father   . Hyperlipidemia Father   . Diabetes Father   . Stroke Father     PTE post CVA  . Prostate cancer Paternal Grandfather   . Hyperlipidemia Paternal Grandfather   . Diabetes Paternal Grandfather   . Heart disease Mother     ?hypertrophic idiopathic subaortic stenosis  . Heart disease Maternal Grandmother   . Diabetes Maternal Grandmother   . Heart disease Maternal Grandfather     HISS  . Heart disease Maternal Aunt     HISS  . Stroke Maternal Aunt 50  . Heart disease Maternal Uncle   . Heart disease Brother     HISS  . Other      EDS in M, bro & niece   Social History  Substance Use Topics  . Smoking status: Former Smoker -- 0.30 packs/day for 10 years    Types: Cigarettes  . Smokeless tobacco: Never Used     Comment: smoked age 89-present, up to 1 ppd.05/24/14 1/2 ppd  . Alcohol Use: No   OB History    No data available  Review of Systems  HENT: Negative for trouble swallowing.   Respiratory: Positive for shortness of breath. Negative for wheezing.   Gastrointestinal: Positive for nausea. Negative for vomiting and diarrhea.  Skin: Positive for pallor and rash.  All other systems reviewed and are negative.  Allergies  Aspirin; Other; Topamax; and Levofloxacin  Home Medications   Prior to Admission medications   Medication Sig Start Date End Date Taking? Authorizing Provider  acetaminophen (TYLENOL) 650 MG CR tablet Take 650 mg by mouth at bedtime as needed for pain.     Historical Provider, MD  amoxicillin (AMOXIL) 500 MG capsule Take 1 capsule (500 mg total) by mouth 3 (three) times daily. 02/07/15   Hendricks Limes, MD  butalbital-acetaminophen-caffeine [facility]) [phone] MG per tablet Take 0.25-0.5 tablets by mouth daily as  needed for migraine.     Historical Provider, MD  diltiazem (CARDIZEM CD) 120 MG 24 hr capsule Take 120 mg by mouth at bedtime.  05/24/14   Historical Provider, MD  fluticasone (FLONASE) 50 MCG/ACT nasal spray Place 1 spray into both nostrils daily.    Historical Provider, MD  levonorgestrel (MIRENA) 20 MCG/24HR IUD 1 each by Intrauterine route once. Implanted fall 2014    Historical Provider, MD  metoprolol tartrate (LOPRESSOR) 25 MG tablet Take 1 tablet (25 mg total) by mouth 2 (two) times daily. 12/17/14   Hendricks Limes, MD  omeprazole (PRILOSEC) 20 MG capsule Take 20 mg by mouth daily.    Historical Provider, MD  polyethylene glycol (MIRALAX) packet Take 17 g by mouth daily. Patient taking differently: Take 17 g by mouth daily as needed (constipation).  08/07/14   Fransico Meadow, PA-C   BP 167/95 mmHg  Pulse 99  Temp(Src) 97.5 F (36.4 C) (Oral)  Resp 20  Ht 5\' 5"  (1.651 m)  Wt 140 lb (63.504 kg)  BMI 23.30 kg/m2  SpO2 100% Physical Exam  Constitutional: She is oriented to person, place, and time. She appears well-developed and well-nourished.  HENT:  Head: Normocephalic and atraumatic.  Right Ear: External ear normal.  Left Ear: External ear normal.  Nose: Nose normal.  Mouth/Throat: Oropharynx is clear and moist.  No uvular or posterior oropharyngeal swelling   Eyes: Conjunctivae and EOM are normal. Pupils are equal, round, and reactive to light.  Neck: Normal range of motion. Neck supple. No JVD present. No tracheal deviation present. No thyromegaly present.  Cardiovascular: Normal rate, regular rhythm, normal heart sounds and intact distal pulses.   Radial HR 80 bpm   Pulmonary/Chest: Effort normal and breath sounds normal. She has no wheezes.  Abdominal: Soft. Bowel sounds are normal. She exhibits no mass. There is no tenderness. There is no guarding.  Musculoskeletal: Normal range of motion.  Lymphadenopathy:    She has no cervical adenopathy.  Neurological: She is alert  and oriented to person, place, and time. She has normal reflexes. No cranial nerve deficit or sensory deficit. Gait normal. GCS eye subscore is 4. GCS verbal subscore is 5. GCS motor subscore is 6.  Reflex Scores:      Bicep reflexes are 2+ on the right side and 2+ on the left side.      Patellar reflexes are 2+ on the right side and 2+ on the left side. Strength is normal and equal throughout. Cranial nerves grossly intact. Patient fluent. No gross ataxia and patient able to ambulate without difficulty.  Skin: Skin is warm and dry. Rash noted.     Linear erythema  C.w. Contact dermatitis vs scratching with histamine release  Psychiatric: She has a normal mood and affect. Her behavior is normal. Judgment and thought content normal.  Nursing note and vitals reviewed.   ED Course  Procedures (including critical care time) DIAGNOSTIC STUDIES: Oxygen Saturation is 100% on RA, normal by my interpretation.    COORDINATION OF CARE: 10:07 PM Discussed treatment plan with pt at bedside and pt agreed to plan.    MDM   Final diagnoses:  Rash   35 y.o. femle with localized rash without respiratory symptoms, gi symptoms or systemic symptoms.  Patient advised to continue benadryl as needed.  I personally performed the services described in this documentation, which was scribed in my presence. The recorded information has been reviewed and considered.   Pattricia Boss, MD 02/16/15 [phone]

## 2015-02-14 NOTE — Discharge Instructions (Signed)

## 2015-02-14 NOTE — ED Notes (Signed)
Pt with sob and HA off and on for past 30 min. Rash also noted to chest.  Pt was at [facility] for procedure to "patch" leaky CSF fluid in neck and had epidural as well.

## 2015-02-14 NOTE — ED Notes (Addendum)
Feels burning at throat and lips tingly, took 25mg  Benadryl about 10 min. ago

## 2015-03-02 DIAGNOSIS — M6281 Muscle weakness (generalized): Secondary | ICD-10-CM | POA: Diagnosis not present

## 2015-03-02 DIAGNOSIS — R200 Anesthesia of skin: Secondary | ICD-10-CM | POA: Diagnosis not present

## 2015-03-02 DIAGNOSIS — R569 Unspecified convulsions: Secondary | ICD-10-CM | POA: Diagnosis not present

## 2015-03-22 DIAGNOSIS — R569 Unspecified convulsions: Secondary | ICD-10-CM | POA: Diagnosis not present

## 2015-03-22 DIAGNOSIS — R200 Anesthesia of skin: Secondary | ICD-10-CM | POA: Diagnosis not present

## 2015-03-22 DIAGNOSIS — M6281 Muscle weakness (generalized): Secondary | ICD-10-CM | POA: Diagnosis not present

## 2015-03-22 DIAGNOSIS — M50222 Other cervical disc displacement at C5-C6 level: Secondary | ICD-10-CM | POA: Diagnosis not present

## 2015-04-01 DIAGNOSIS — R569 Unspecified convulsions: Secondary | ICD-10-CM | POA: Diagnosis not present

## 2015-04-02 DIAGNOSIS — R569 Unspecified convulsions: Secondary | ICD-10-CM | POA: Diagnosis not present

## 2015-04-03 DIAGNOSIS — R569 Unspecified convulsions: Secondary | ICD-10-CM | POA: Diagnosis not present

## 2015-04-10 DIAGNOSIS — K219 Gastro-esophageal reflux disease without esophagitis: Secondary | ICD-10-CM | POA: Diagnosis not present

## 2015-04-10 DIAGNOSIS — I10 Essential (primary) hypertension: Secondary | ICD-10-CM | POA: Diagnosis not present

## 2015-04-10 DIAGNOSIS — R350 Frequency of micturition: Secondary | ICD-10-CM | POA: Diagnosis not present

## 2015-04-10 DIAGNOSIS — R3915 Urgency of urination: Secondary | ICD-10-CM | POA: Diagnosis not present

## 2015-04-10 DIAGNOSIS — J309 Allergic rhinitis, unspecified: Secondary | ICD-10-CM | POA: Diagnosis not present

## 2015-04-24 DIAGNOSIS — Z6825 Body mass index (BMI) 25.0-25.9, adult: Secondary | ICD-10-CM | POA: Diagnosis not present

## 2015-04-24 DIAGNOSIS — E663 Overweight: Secondary | ICD-10-CM | POA: Diagnosis not present

## 2015-04-24 DIAGNOSIS — J019 Acute sinusitis, unspecified: Secondary | ICD-10-CM | POA: Diagnosis not present

## 2015-04-24 DIAGNOSIS — G43909 Migraine, unspecified, not intractable, without status migrainosus: Secondary | ICD-10-CM | POA: Diagnosis not present

## 2015-04-24 DIAGNOSIS — L0390 Cellulitis, unspecified: Secondary | ICD-10-CM | POA: Diagnosis not present

## 2015-04-24 DIAGNOSIS — Z1389 Encounter for screening for other disorder: Secondary | ICD-10-CM | POA: Diagnosis not present

## 2015-05-09 DIAGNOSIS — H9319 Tinnitus, unspecified ear: Secondary | ICD-10-CM | POA: Diagnosis not present

## 2015-05-09 DIAGNOSIS — G960 Cerebrospinal fluid leak: Secondary | ICD-10-CM | POA: Diagnosis not present

## 2015-05-09 DIAGNOSIS — R51 Headache: Secondary | ICD-10-CM | POA: Diagnosis not present

## 2015-06-23 DIAGNOSIS — N200 Calculus of kidney: Secondary | ICD-10-CM | POA: Diagnosis not present

## 2015-06-23 DIAGNOSIS — G960 Cerebrospinal fluid leak: Secondary | ICD-10-CM | POA: Diagnosis not present

## 2015-06-26 ENCOUNTER — Other Ambulatory Visit: Payer: Self-pay | Admitting: Obstetrics and Gynecology

## 2015-06-26 DIAGNOSIS — Z124 Encounter for screening for malignant neoplasm of cervix: Secondary | ICD-10-CM | POA: Diagnosis not present

## 2015-06-26 DIAGNOSIS — Z01419 Encounter for gynecological examination (general) (routine) without abnormal findings: Secondary | ICD-10-CM | POA: Diagnosis not present

## 2015-06-27 LAB — CYTOLOGY - PAP

## 2015-07-05 ENCOUNTER — Ambulatory Visit
Admission: RE | Admit: 2015-07-05 | Discharge: 2015-07-05 | Disposition: A | Discharge status: Home / Self Care | Payer: 59 | Source: Ambulatory Visit | Attending: Family Medicine | Admitting: Family Medicine

## 2015-07-05 ENCOUNTER — Other Ambulatory Visit: Payer: Self-pay | Admitting: Family Medicine

## 2015-07-05 DIAGNOSIS — N200 Calculus of kidney: Secondary | ICD-10-CM

## 2015-07-18 DIAGNOSIS — Z30432 Encounter for removal of intrauterine contraceptive device: Secondary | ICD-10-CM | POA: Diagnosis not present

## 2015-08-03 ENCOUNTER — Other Ambulatory Visit: Payer: Self-pay | Admitting: Obstetrics and Gynecology

## 2015-08-07 DIAGNOSIS — G43009 Migraine without aura, not intractable, without status migrainosus: Secondary | ICD-10-CM | POA: Diagnosis not present

## 2015-08-07 DIAGNOSIS — K219 Gastro-esophageal reflux disease without esophagitis: Secondary | ICD-10-CM | POA: Diagnosis not present

## 2015-08-07 DIAGNOSIS — Z6826 Body mass index (BMI) 26.0-26.9, adult: Secondary | ICD-10-CM | POA: Diagnosis not present

## 2015-08-07 DIAGNOSIS — D680 Von Willebrand's disease: Secondary | ICD-10-CM | POA: Diagnosis not present

## 2015-08-07 DIAGNOSIS — I10 Essential (primary) hypertension: Secondary | ICD-10-CM | POA: Diagnosis not present

## 2015-08-07 DIAGNOSIS — Z1389 Encounter for screening for other disorder: Secondary | ICD-10-CM | POA: Diagnosis not present

## 2015-08-07 DIAGNOSIS — G8191 Hemiplegia, unspecified affecting right dominant side: Secondary | ICD-10-CM | POA: Diagnosis not present

## 2015-08-08 NOTE — Patient Instructions (Addendum)
Your procedure is scheduled on:  Wednesday, July, 12, 2017  Enter through the [employer] [facility] at: 10:45 AM  Pick up the phone at the desk and dial [phone].  Call this number if you have problems the morning of surgery: [phone].  Remember: Do NOT eat food:  After Midnight Tuesday, August 22, 2015  Do NOT drink clear liquids after:  8:00 AM day of surgery  Take these medicines the morning of surgery with a SIP OF WATER: Metoprolol, Omeprazole  Do NOT wear jewelry (body piercing), metal hair clips/bobby pins, make-up, or nail polish. Do NOT wear lotions, powders, or perfumes.  You may wear deodorant. Do NOT shave for 48 hours prior to surgery. Do NOT bring valuables to the hospital.   Have a responsible adult drive you home and stay with you for 24 hours after your procedure.  Home with Husband "Ray" cell [phone].

## 2015-08-09 ENCOUNTER — Other Ambulatory Visit: Payer: Self-pay | Admitting: Obstetrics and Gynecology

## 2015-08-09 ENCOUNTER — Encounter (HOSPITAL_COMMUNITY)
Admission: RE | Admit: 2015-08-09 | Discharge: 2015-08-09 | Disposition: A | Discharge status: Home / Self Care | Payer: 59 | Source: Ambulatory Visit | Attending: Obstetrics and Gynecology | Admitting: Obstetrics and Gynecology

## 2015-08-09 ENCOUNTER — Encounter (HOSPITAL_COMMUNITY): Payer: Self-pay

## 2015-08-09 DIAGNOSIS — Z01812 Encounter for preprocedural laboratory examination: Secondary | ICD-10-CM | POA: Insufficient documentation

## 2015-08-09 HISTORY — DX: Essential (primary) hypertension: I10

## 2015-08-09 HISTORY — DX: Gastro-esophageal reflux disease without esophagitis: K21.9

## 2015-08-09 HISTORY — DX: Chronic sinusitis, unspecified: J32.9

## 2015-08-09 HISTORY — DX: Personal history of other medical treatment: Z92.89

## 2015-08-09 LAB — CBC
HCT: 38.3 % (ref 36.0–46.0)
Hemoglobin: 12.8 g/dL (ref 12.0–15.0)
MCH: 29.5 pg (ref 26.0–34.0)
MCHC: 33.4 g/dL (ref 30.0–36.0)
MCV: 88.2 fL (ref 78.0–100.0)
Platelets: 260 10*3/uL (ref 150–400)
RBC: 4.34 MIL/uL (ref 3.87–5.11)
RDW: 15.1 % (ref 11.5–15.5)
WBC: 6.4 10*3/uL (ref 4.0–10.5)

## 2015-08-09 LAB — BASIC METABOLIC PANEL
Anion gap: 6 (ref 5–15)
BUN: 9 mg/dL (ref 6–20)
CO2: 26 mmol/L (ref 22–32)
Calcium: 8.6 mg/dL — ABNORMAL LOW (ref 8.9–10.3)
Chloride: 102 mmol/L (ref 101–111)
Creatinine, Ser: 0.63 mg/dL (ref 0.44–1.00)
GFR calc Af Amer: >60 mL/min (ref >60)
GFR calc non Af Amer: >60 mL/min (ref >60)
Glucose, Bld: 91 mg/dL (ref 65–99)
Potassium: 4.2 mmol/L (ref 3.5–5.1)
Sodium: 134 mmol/L — ABNORMAL LOW (ref 135–145)

## 2015-08-23 ENCOUNTER — Ambulatory Visit (HOSPITAL_COMMUNITY): Payer: 59 | Admitting: Anesthesiology

## 2015-08-23 ENCOUNTER — Encounter (HOSPITAL_COMMUNITY)
Admission: RE | Disposition: A | Discharge status: Home / Self Care | Payer: Self-pay | Source: Ambulatory Visit | Attending: Obstetrics and Gynecology

## 2015-08-23 ENCOUNTER — Ambulatory Visit (HOSPITAL_COMMUNITY)
Admission: RE | Admit: 2015-08-23 | Discharge: 2015-08-23 | Disposition: A | Discharge status: Home / Self Care | Payer: 59 | Source: Ambulatory Visit | Attending: Obstetrics and Gynecology | Admitting: Obstetrics and Gynecology

## 2015-08-23 ENCOUNTER — Encounter (HOSPITAL_COMMUNITY): Payer: Self-pay | Admitting: *Deleted

## 2015-08-23 DIAGNOSIS — N926 Irregular menstruation, unspecified: Secondary | ICD-10-CM | POA: Diagnosis not present

## 2015-08-23 DIAGNOSIS — D680 Von Willebrand's disease: Secondary | ICD-10-CM | POA: Diagnosis not present

## 2015-08-23 DIAGNOSIS — D649 Anemia, unspecified: Secondary | ICD-10-CM | POA: Diagnosis not present

## 2015-08-23 DIAGNOSIS — N920 Excessive and frequent menstruation with regular cycle: Secondary | ICD-10-CM | POA: Diagnosis present

## 2015-08-23 DIAGNOSIS — Z87891 Personal history of nicotine dependence: Secondary | ICD-10-CM | POA: Diagnosis not present

## 2015-08-23 DIAGNOSIS — K219 Gastro-esophageal reflux disease without esophagitis: Secondary | ICD-10-CM | POA: Insufficient documentation

## 2015-08-23 DIAGNOSIS — I10 Essential (primary) hypertension: Secondary | ICD-10-CM | POA: Diagnosis not present

## 2015-08-23 HISTORY — PX: DILITATION & CURRETTAGE/HYSTROSCOPY WITH NOVASURE ABLATION: SHX5568

## 2015-08-23 LAB — PREGNANCY, URINE: Preg Test, Ur: NEGATIVE

## 2015-08-23 SURGERY — DILATATION & CURETTAGE/HYSTEROSCOPY WITH NOVASURE ABLATION
Anesthesia: General | Site: Vagina

## 2015-08-23 MED ORDER — ONDANSETRON HCL 4 MG/2ML IJ SOLN
INTRAMUSCULAR | Status: DC | PRN
  Administered 2015-08-23: 4 mg via INTRAVENOUS

## 2015-08-23 MED ORDER — LACTATED RINGERS IV SOLN
INTRAVENOUS | Status: DC
  Administered 2015-08-23 (×2): via INTRAVENOUS

## 2015-08-23 MED ORDER — FENTANYL CITRATE (PF) 100 MCG/2ML IJ SOLN
INTRAMUSCULAR | Status: AC
  Filled 2015-08-23: qty 2

## 2015-08-23 MED ORDER — PROMETHAZINE HCL 25 MG/ML IJ SOLN: 6.2500 mg | INTRAMUSCULAR | Status: DC | PRN

## 2015-08-23 MED ORDER — MIDAZOLAM HCL 2 MG/2ML IJ SOLN
INTRAMUSCULAR | Status: AC
  Filled 2015-08-23: qty 2

## 2015-08-23 MED ORDER — FENTANYL CITRATE (PF) 100 MCG/2ML IJ SOLN
25.0000 ug | INTRAMUSCULAR | Status: DC | PRN
  Administered 2015-08-23 (×3): 50 ug via INTRAVENOUS

## 2015-08-23 MED ORDER — LIDOCAINE HCL (CARDIAC) 20 MG/ML IV SOLN
INTRAVENOUS | Status: AC
  Filled 2015-08-23: qty 5

## 2015-08-23 MED ORDER — SCOPOLAMINE 1 MG/3DAYS TD PT72
1.0000 | MEDICATED_PATCH | Freq: Once | TRANSDERMAL | Status: DC
  Administered 2015-08-23: 1.5 mg via TRANSDERMAL

## 2015-08-23 MED ORDER — PROPOFOL 10 MG/ML IV BOLUS
INTRAVENOUS | Status: DC | PRN
  Administered 2015-08-23: 160 mg via INTRAVENOUS

## 2015-08-23 MED ORDER — EPHEDRINE SULFATE 50 MG/ML IJ SOLN
INTRAMUSCULAR | Status: DC | PRN
  Administered 2015-08-23 (×5): 5 mg via INTRAVENOUS

## 2015-08-23 MED ORDER — LACTATED RINGERS IV SOLN: INTRAVENOUS | Status: DC

## 2015-08-23 MED ORDER — LACTATED RINGERS IR SOLN: Status: DC | PRN

## 2015-08-23 MED ORDER — ONDANSETRON HCL 4 MG/2ML IJ SOLN
INTRAMUSCULAR | Status: AC
  Filled 2015-08-23: qty 2

## 2015-08-23 MED ORDER — LIDOCAINE HCL (CARDIAC) 20 MG/ML IV SOLN
INTRAVENOUS | Status: DC | PRN
  Administered 2015-08-23: 70 mg via INTRAVENOUS

## 2015-08-23 MED ORDER — HYDROCODONE-ACETAMINOPHEN 5-325 MG PO TABS: ORAL_TABLET | ORAL | Status: DC

## 2015-08-23 MED ORDER — LIDOCAINE HCL 1 % IJ SOLN
INTRAMUSCULAR | Status: DC | PRN
  Administered 2015-08-23: 20 mL

## 2015-08-23 MED ORDER — EPHEDRINE 5 MG/ML INJ
INTRAVENOUS | Status: AC
  Filled 2015-08-23: qty 10

## 2015-08-23 MED ORDER — DEXAMETHASONE SODIUM PHOSPHATE 4 MG/ML IJ SOLN
INTRAMUSCULAR | Status: AC
  Filled 2015-08-23: qty 1

## 2015-08-23 MED ORDER — FENTANYL CITRATE (PF) 100 MCG/2ML IJ SOLN
INTRAMUSCULAR | Status: DC | PRN
  Administered 2015-08-23: 25 ug via INTRAVENOUS
  Administered 2015-08-23: 50 ug via INTRAVENOUS
  Administered 2015-08-23: 25 ug via INTRAVENOUS

## 2015-08-23 MED ORDER — SCOPOLAMINE 1 MG/3DAYS TD PT72
MEDICATED_PATCH | TRANSDERMAL | Status: AC
  Administered 2015-08-23: 1.5 mg via TRANSDERMAL
  Filled 2015-08-23: qty 1

## 2015-08-23 MED ORDER — MIDAZOLAM HCL 2 MG/2ML IJ SOLN
INTRAMUSCULAR | Status: DC | PRN
  Administered 2015-08-23: 2 mg via INTRAVENOUS

## 2015-08-23 MED ORDER — OXYCODONE HCL 5 MG PO TABS
ORAL_TABLET | ORAL | Status: AC
  Filled 2015-08-23: qty 1

## 2015-08-23 MED ORDER — DEXAMETHASONE SODIUM PHOSPHATE 10 MG/ML IJ SOLN
INTRAMUSCULAR | Status: DC | PRN
  Administered 2015-08-23: 4 mg via INTRAVENOUS

## 2015-08-23 MED ORDER — HYDROCODONE-ACETAMINOPHEN 5-325 MG PO TABS: 1.0000 | ORAL_TABLET | Freq: Once | ORAL | Status: DC

## 2015-08-23 MED ORDER — OXYCODONE HCL 5 MG PO TABS
5.0000 mg | ORAL_TABLET | Freq: Once | ORAL | Status: AC
  Administered 2015-08-23: 5 mg via ORAL

## 2015-08-23 MED ORDER — ACETAMINOPHEN 10 MG/ML IV SOLN
1000.0000 mg | Freq: Once | INTRAVENOUS | Status: AC
  Administered 2015-08-23: 1000 mg via INTRAVENOUS
  Filled 2015-08-23: qty 100

## 2015-08-23 MED ORDER — SODIUM CHLORIDE 0.9 % IR SOLN
Status: DC | PRN
  Administered 2015-08-23: 3000 mL

## 2015-08-23 MED ORDER — LIDOCAINE HCL 1 % IJ SOLN
INTRAMUSCULAR | Status: AC
  Filled 2015-08-23: qty 20

## 2015-08-23 MED ORDER — PROPOFOL 10 MG/ML IV BOLUS
INTRAVENOUS | Status: AC
  Filled 2015-08-23: qty 20

## 2015-08-23 MED ORDER — HYDROCODONE-ACETAMINOPHEN 5-325 MG PO TABS
ORAL_TABLET | ORAL | Status: AC
  Filled 2015-08-23: qty 1

## 2015-08-23 SURGICAL SUPPLY — 18 items
ABLATOR ENDOMETRIAL BIPOLAR (ABLATOR) IMPLANT
CANISTER SUCT 3000ML (MISCELLANEOUS) ×3 IMPLANT
CATH ROBINSON RED A/P 16FR (CATHETERS) ×3 IMPLANT
CLOTH BEACON ORANGE TIMEOUT ST (SAFETY) ×3 IMPLANT
CONTAINER PREFILL 10% NBF 60ML (FORM) ×6 IMPLANT
DILATOR CANAL MILEX (MISCELLANEOUS) IMPLANT
ELECT REM PT RETURN 9FT ADLT (ELECTROSURGICAL)
ELECTRODE REM PT RTRN 9FT ADLT (ELECTROSURGICAL) IMPLANT
GLOVE BIO SURGEON STRL SZ7 (GLOVE) ×3 IMPLANT
GLOVE BIOGEL PI IND STRL 7.0 (GLOVE) ×1 IMPLANT
GLOVE BIOGEL PI INDICATOR 7.0 (GLOVE) ×2
GOWN STRL REUS W/TWL LRG LVL3 (GOWN DISPOSABLE) ×9 IMPLANT
PACK VAGINAL MINOR WOMEN LF (CUSTOM PROCEDURE TRAY) ×3 IMPLANT
PAD OB MATERNITY 4.3X12.25 (PERSONAL CARE ITEMS) ×3 IMPLANT
TOWEL OR 17X24 6PK STRL BLUE (TOWEL DISPOSABLE) ×6 IMPLANT
TUBING AQUILEX INFLOW (TUBING) ×3 IMPLANT
TUBING AQUILEX OUTFLOW (TUBING) ×3 IMPLANT
WATER STERILE IRR 1000ML POUR (IV SOLUTION) ×3 IMPLANT

## 2015-08-23 NOTE — Anesthesia Procedure Notes (Signed)
Procedure Name: LMA Insertion Date/Time: 08/23/2015 12:09 PM Performed by: Raenette Rover Pre-anesthesia Checklist: Patient identified, Emergency Drugs available, Suction available and Patient being monitored Patient Re-evaluated:Patient Re-evaluated prior to inductionOxygen Delivery Method: Circle system utilized Preoxygenation: Pre-oxygenation with 100% oxygen Intubation Type: IV induction LMA: LMA inserted LMA Size: 4.0 Number of attempts: 1 Placement Confirmation: positive ETCO2,  CO2 detector and breath sounds checked- equal and bilateral Tube secured with: Tape Dental Injury: Teeth and Oropharynx as per pre-operative assessment

## 2015-08-23 NOTE — H&P (Signed)
Lorraine Weber is an 35 y.o. female.   35 yo G80 female presents for surgical management of menorrhagia. She has a Weber/o menorrhagia that was previously well controlled with a Levonogestrel IUD, however her neurosurgeon has recommended removal of the IUD d/t its association with intracranial hypertension. She has a hx of a traumatic brain injury that required neurosurgery. Her post op course has been complicated by persistent intracranial hypertension. She is having another surgery in the fall to address the ICH. Management options were reviewed with the patient. She does not desire fertility. Her husband is planning vasectomy. Pt understands that an endometrial ablation should not be used for birth control. Risks, benefits, alternatives of the procedure were discussed with the patient at length and she wishes to proceed with Novasure endometrial ablation   No LMP recorded. Patient is not currently having periods (Reason: IUD).    Past Medical History  Diagnosis Date  . Migraines   . Von Willebrand disease ([place])     mild form per patient  . Anemia   . Peptic ulcer disease   . Concussion     x8, last 2010, residual homonymous Hemianopsia  . Migraines   . Mastoiditis   . CSF leak     multiple serum pathes  . Hypertension   . Frequent sinus infections     tx with flonase nasal spray  . Nephrolithiasis     X5, Dr Jeffie Pollock, passed stones, no surgery  . GERD (gastroesophageal reflux disease)   . History of blood transfusion     over 5 yrs ago    Past Surgical History  Procedure Laterality Date  . Appendectomy    . Hand tendon surgery Left     left, complicated by MRSA  . Spinal fluid leak patching      > 10X;[facility] - blood patching  . Back surgery      T5-6  . Wisdom tooth extraction      Family History  Problem Relation Age of Onset  . Arthritis Father   . Hyperlipidemia Father   . Diabetes Father   . Stroke Father     PTE post CVA  . Prostate cancer Paternal Grandfather   .  Hyperlipidemia Paternal Grandfather   . Diabetes Paternal Grandfather   . Heart disease Mother     ?hypertrophic idiopathic subaortic stenosis  . Heart disease Maternal Grandmother   . Diabetes Maternal Grandmother   . Heart disease Maternal Grandfather     HISS  . Heart disease Maternal Aunt     HISS  . Stroke Maternal Aunt 50  . Heart disease Maternal Uncle   . Heart disease Brother     HISS  . Other      EDS in M, bro & niece    Social History:  reports that she has quit smoking. Her smoking use included Cigarettes. She has a 3 pack-year smoking history. She has never used smokeless tobacco. She reports that she does not drink alcohol or use illicit drugs.  Allergies:  Allergies  Allergen Reactions  . Aspirin Other (See Comments)    REACTION: ANY BLOOD THINNERS/ DUE TO BLOOD DISORDER-VON WILDEBRAND'S   . Other Other (See Comments)    Pt Instructed by hematologist not to take blood thinners  ALL BLOOD THINNERS-CANNOT TAKE DUE TO [facility]   . Topamax [Topiramate] Other (See Comments)    HYPOTENSION AND SYNCOPE   . Levofloxacin Itching and Rash  . Levofloxacin In D5w Rash and Swelling  Prescriptions prior to admission  Medication Sig Dispense Refill Last Dose  . acetaminophen (TYLENOL) 650 MG CR tablet Take 650 mg by mouth at bedtime as needed for pain.    08/22/2015 at Unknown time  . butalbital-acetaminophen-caffeine (ESGIC) 50-325-40 MG per tablet Take 0.25-0.5 tablets by mouth daily as needed for migraine.    08/22/2015 at Unknown time  . diltiazem (CARDIZEM CD) 120 MG 24 hr capsule Take 120 mg by mouth at bedtime.    08/22/2015 at Unknown time  . fluticasone (FLONASE) 50 MCG/ACT nasal spray Place 1 spray into both nostrils daily.   08/21/2015  . metoprolol succinate (TOPROL-XL) 100 MG 24 hr tablet Take 100 mg by mouth daily. Take with or immediately following a meal.   08/23/2015 at Unknown time  . omeprazole (PRILOSEC) 20 MG capsule Take 20 mg by mouth daily.    08/23/2015 at Unknown time  . ondansetron (ZOFRAN) 4 MG tablet Take 4 mg by mouth every 8 (eight) hours as needed for nausea or vomiting.   Past Week at Unknown time  . polyethylene glycol (MIRALAX) packet Take 17 g by mouth daily. (Patient taking differently: Take 17 g by mouth daily as needed (constipation). ) 14 each 0 Past Month at Unknown time  . metoprolol tartrate (LOPRESSOR) 25 MG tablet Take 1 tablet (25 mg total) by mouth 2 (two) times daily. (Patient not taking: Reported on 08/08/2015) 180 tablet 3 Taking    ROS  Blood pressure 155/94, pulse 74, temperature 98.3 F (36.8 C), temperature source Oral, resp. rate 16, SpO2 100 %. Physical Exam   AOX3, NAD Normal work of breathing Abd soft  Results for orders placed or performed during the hospital encounter of 08/23/15 (from the past 24 hour(s))  Pregnancy, urine     Status: None   Collection Time: 08/23/15 10:45 AM  Result Value Ref Range   Preg Test, Ur NEGATIVE NEGATIVE    No results found.  Assessment/Plan: 1) admit 2) Consent for hysteroscopy, dilation and curettage and novasure endometrial ablation  Lorraine Weber. 08/23/2015, 11:19 AM

## 2015-08-23 NOTE — Anesthesia Postprocedure Evaluation (Signed)
Anesthesia Post Note  Patient: Lorraine Weber  Procedure(s) Performed: Procedure(s) (LRB): DILATATION & CURETTAGE/HYSTEROSCOPY WITH NOVASURE ABLATION (N/A)  Patient location during evaluation: PACU Anesthesia Type: General Level of consciousness: awake and alert and oriented Pain management: pain level controlled Vital Signs Assessment: post-procedure vital signs reviewed and stable Respiratory status: spontaneous breathing, nonlabored ventilation and respiratory function stable Cardiovascular status: blood pressure returned to baseline and stable Postop Assessment: no signs of nausea or vomiting Anesthetic complications: no     Last Vitals:  Filed Vitals:   08/23/15 1345 08/23/15 1400  BP: 140/74 144/81  Pulse: 78 78  Temp:  36.6 C  Resp: 19 20    Last Pain:  Filed Vitals:   08/23/15 1411  PainSc: 2    Pain Goal: Patients Stated Pain Goal: 3 (08/23/15 1400)               Ciaira Natividad A.

## 2015-08-23 NOTE — Transfer of Care (Signed)
Immediate Anesthesia Transfer of Care Note  Patient: Lorraine Weber  Procedure(s) Performed: Procedure(s): DILATATION & CURETTAGE/HYSTEROSCOPY WITH NOVASURE ABLATION (N/A)  Patient Location: PACU  Anesthesia Type:General  Level of Consciousness: awake, alert , oriented and patient cooperative  Airway & Oxygen Therapy: Patient Spontanous Breathing and Patient connected to nasal cannula oxygen  Post-op Assessment: Report given to RN and Post -op Vital signs reviewed and stable  Post vital signs: Reviewed and stable  Last Vitals:  Filed Vitals:   08/23/15 1055  BP: 155/94  Pulse: 74  Temp: 36.8 C  Resp: 16    Last Pain: There were no vitals filed for this visit.    Patients Stated Pain Goal: 3 ([id number])  Complications: No apparent anesthesia complications

## 2015-08-23 NOTE — Anesthesia Preprocedure Evaluation (Addendum)
Anesthesia Evaluation  Patient identified by MRN, date of birth, ID band Patient awake    Reviewed: Allergy & Precautions, NPO status , Patient's Chart, lab work & pertinent test results, reviewed documented beta blocker date and time   Airway Mallampati: II  TM Distance: >3 FB Neck ROM: Full    Dental  (+) Teeth Intact, Dental Advisory Given   Pulmonary former smoker,    Pulmonary exam normal breath sounds clear to auscultation       Cardiovascular hypertension, Pt. on medications and Pt. on home beta blockers (-) angina(-) Past MI Normal cardiovascular exam Rhythm:Regular Rate:Normal     Neuro/Psych neg Headaches, Seizures - (CSF leak T11; last seizure 3 weeks ago), Well Controlled,  negative psych ROS   GI/Hepatic Neg liver ROS, GERD  Medicated and Controlled,  Endo/Other  negative endocrine ROS  Renal/GU negative Renal ROS     Musculoskeletal negative musculoskeletal ROS (+)   Abdominal   Peds  Hematology  (+) Blood dyscrasia, anemia , vWD--mild per patient   Anesthesia Other Findings Day of surgery medications reviewed with the patient.  Reproductive/Obstetrics Menorrhagia                             Anesthesia Physical Anesthesia Plan  ASA: II  Anesthesia Plan: General   Post-op Pain Management:    Induction: Intravenous  Airway Management Planned: LMA  Additional Equipment:   Intra-op Plan:   Post-operative Plan: Extubation in OR  Informed Consent: I have reviewed the patients History and Physical, chart, labs and discussed the procedure including the risks, benefits and alternatives for the proposed anesthesia with the patient or authorized representative who has indicated his/her understanding and acceptance.   Dental advisory given  Plan Discussed with: CRNA  Anesthesia Plan Comments: (Risks/benefits of general anesthesia discussed with patient including risk of  damage to teeth, lips, gum, and tongue, nausea/vomiting, allergic reactions to medications, and the possibility of heart attack, stroke and death.  All patient questions answered.  Patient wishes to proceed.)       Anesthesia Quick Evaluation

## 2015-08-23 NOTE — Discharge Instructions (Signed)
DISCHARGE INSTRUCTIONS: HYSTEROSCOPY / ENDOMETRIAL ABLATION The following instructions have been prepared to help you care for yourself upon your return home.  May Remove Scop patch on or before 08/26/15.  Wash hands with soap and water after contact with the patch.  May take stool softner while taking narcotic pain medication to prevent constipation.  Drink plenty of water.  Personal hygiene:  Use sanitary pads for vaginal drainage, not tampons.  Shower the day after your procedure.  NO tub baths, pools or Jacuzzis for 2-3 weeks.  Wipe front to back after using the bathroom.  Activity and limitations:  Do NOT drive or operate any equipment for 24 hours. The effects of anesthesia are still present and drowsiness may result.  Do NOT rest in bed all day.  Walking is encouraged.  Walk up and down stairs slowly.  You may resume your normal activity in one to two days or as indicated by your physician. Sexual activity: NO intercourse for at least 2 weeks after the procedure, or as indicated by your Doctor.  Diet: Eat a light meal as desired this evening. You may resume your usual diet tomorrow.  Return to Work: You may resume your work activities in one to two days or as indicated by Marine scientist.  What to expect after your surgery: Expect to have vaginal bleeding/discharge for 2-3 days and spotting for up to 10 days. It is not unusual to have soreness for up to 1-2 weeks. You may have a slight burning sensation when you urinate for the first day. Mild cramps may continue for a couple of days. You may have a regular period in 2-6 weeks.  Call your doctor for any of the following:  Excessive vaginal bleeding or clotting, saturating and changing one pad every hour.  Inability to urinate 6 hours after discharge from hospital.  Pain not relieved by pain medication.  Fever of 100.4 F or greater.  Unusual vaginal discharge or odor.  Return to office _________________Call for  an appointment ___________________ Patients signature: ______________________ Nurses signature ________________________  [place] [facility] [phone]

## 2015-08-23 NOTE — Op Note (Signed)
  Pre-Operative Diagnosis: 1) Menorrhagia Postoperative Diagnosis: 1) menorrhagia Procedure: Hysteroscopy, dilation and curettage, Novasure endometrial ablation Surgeon: Dr. Vanessa Kick Assistant: None Operative Findings: RV uterus 7 week size. Normal appearing cavity. Tubal ostia visualized bilaterally. Specimen: Endometrial curettings. Cavity sounded to 8.5 cm. Cervical length 3 cm, Cavity Length 5.5 cm. Width 2.6 cm. Power 79 Watts. Ablation time 1:36 minutes. Fluid deficit 150 cc EBL: Total I/O In: 700 [I.V.:700] Out: 30 [Urine:20; Blood:10]   Lorraine Weber is a 35 year old gravida 0 who presents for definitive surgical management for menorrhagia. Please see the patient's history and physical for complete details of the history. Management options were discussed with the patient. R/B/A reviewed. Following appropriate informed consent was taken to the operating room. The patient was appropriately identified during a time out procedure. General anesthesia was administered and the patient was placed in the dorsal lithotomy position. The patient was prepped and draped in the normal sterile fashion. A speculum was placed into the vagina, a single-tooth tenaculum was placed on the anterior lip of the cervix, and 20 cc of 1% lidocaine was administered in a paracervical fashion. The cervix was serially dilated with Hank dilators. The uterus sounded to 8.5 cm. The hysteroscope was introduced for the above findings. The hysteroscope was removed and a sharp curettage was performed. The Novasure devise was then deployed. A cavity assessment was successfully performed and an ablation was performed in 1:36. The Novasure device was removed and a final hysteroscopy was performed. Adequate ablation was visualized. This completed the procedure. The single toothed tenaculum was removed and the tenaculum site was hemostatic. The Speculum was removed. All sponge, lap and needle counts were correct. The patient was taken to  the PACU in stable condition following the procedure.

## 2015-08-24 ENCOUNTER — Encounter (HOSPITAL_COMMUNITY): Payer: Self-pay | Admitting: Obstetrics and Gynecology

## 2015-08-25 NOTE — Anesthesia Postprocedure Evaluation (Signed)
Anesthesia Post Note  Patient: Lorraine Weber  Procedure(s) Performed: Procedure(s) (LRB): DILATATION & CURETTAGE/HYSTEROSCOPY WITH NOVASURE ABLATION (N/A)  Patient location during evaluation: PACU Anesthesia Type: General Level of consciousness: awake and alert Pain management: pain level controlled Vital Signs Assessment: post-procedure vital signs reviewed and stable Respiratory status: spontaneous breathing, nonlabored ventilation, respiratory function stable and patient connected to nasal cannula oxygen Cardiovascular status: blood pressure returned to baseline and stable Postop Assessment: no signs of nausea or vomiting Anesthetic complications: no    Last Vitals:  Filed Vitals:   08/23/15 1400 08/23/15 1445  BP: 144/81 132/82  Pulse: 78 62  Temp: 36.6 C 36.6 C  Resp: 20 18    Last Pain:  Filed Vitals:   08/23/15 1630  PainSc: 3                  Catalina Gravel

## 2015-09-13 DIAGNOSIS — D680 Von Willebrand's disease: Secondary | ICD-10-CM | POA: Diagnosis not present

## 2015-09-13 DIAGNOSIS — M4806 Spinal stenosis, lumbar region: Secondary | ICD-10-CM | POA: Diagnosis not present

## 2015-09-13 DIAGNOSIS — M4844XA Fatigue fracture of vertebra, thoracic region, initial encounter for fracture: Secondary | ICD-10-CM | POA: Diagnosis not present

## 2015-09-13 DIAGNOSIS — G960 Cerebrospinal fluid leak: Secondary | ICD-10-CM | POA: Diagnosis not present

## 2015-09-13 DIAGNOSIS — Z01818 Encounter for other preprocedural examination: Secondary | ICD-10-CM | POA: Diagnosis not present

## 2015-09-18 DIAGNOSIS — I10 Essential (primary) hypertension: Secondary | ICD-10-CM | POA: Diagnosis present

## 2015-09-18 DIAGNOSIS — Z888 Allergy status to other drugs, medicaments and biological substances status: Secondary | ICD-10-CM | POA: Diagnosis not present

## 2015-09-18 DIAGNOSIS — K219 Gastro-esophageal reflux disease without esophagitis: Secondary | ICD-10-CM | POA: Diagnosis present

## 2015-09-18 DIAGNOSIS — G960 Cerebrospinal fluid leak: Secondary | ICD-10-CM | POA: Diagnosis not present

## 2015-09-18 DIAGNOSIS — M5184 Other intervertebral disc disorders, thoracic region: Secondary | ICD-10-CM | POA: Diagnosis present

## 2015-09-18 DIAGNOSIS — Z8711 Personal history of peptic ulcer disease: Secondary | ICD-10-CM | POA: Diagnosis not present

## 2015-09-18 DIAGNOSIS — M4324 Fusion of spine, thoracic region: Secondary | ICD-10-CM | POA: Insufficient documentation

## 2015-09-18 DIAGNOSIS — Z8614 Personal history of Methicillin resistant Staphylococcus aureus infection: Secondary | ICD-10-CM | POA: Diagnosis not present

## 2015-09-18 DIAGNOSIS — D680 Von Willebrand's disease: Secondary | ICD-10-CM | POA: Diagnosis present

## 2015-09-18 DIAGNOSIS — Z87442 Personal history of urinary calculi: Secondary | ICD-10-CM | POA: Diagnosis not present

## 2015-10-25 DIAGNOSIS — G960 Cerebrospinal fluid leak: Secondary | ICD-10-CM | POA: Diagnosis not present

## 2015-11-22 DIAGNOSIS — M5124 Other intervertebral disc displacement, thoracic region: Secondary | ICD-10-CM | POA: Diagnosis not present

## 2015-11-22 DIAGNOSIS — M2578 Osteophyte, vertebrae: Secondary | ICD-10-CM | POA: Diagnosis not present

## 2015-11-22 DIAGNOSIS — R51 Headache: Secondary | ICD-10-CM | POA: Diagnosis not present

## 2015-12-13 DIAGNOSIS — G8190 Hemiplegia, unspecified affecting unspecified side: Secondary | ICD-10-CM | POA: Diagnosis not present

## 2015-12-13 DIAGNOSIS — R110 Nausea: Secondary | ICD-10-CM | POA: Diagnosis not present

## 2015-12-13 DIAGNOSIS — G43909 Migraine, unspecified, not intractable, without status migrainosus: Secondary | ICD-10-CM | POA: Diagnosis not present

## 2015-12-13 DIAGNOSIS — Z6827 Body mass index (BMI) 27.0-27.9, adult: Secondary | ICD-10-CM | POA: Diagnosis not present

## 2015-12-13 DIAGNOSIS — Z1389 Encounter for screening for other disorder: Secondary | ICD-10-CM | POA: Diagnosis not present

## 2015-12-25 ENCOUNTER — Telehealth: Payer: Self-pay | Admitting: Cardiovascular Disease

## 2015-12-25 NOTE — Telephone Encounter (Signed)
Records received from [facility] for apt on 02/16/16 @ 11:15 with Dr Jacquiline Doe. Records given to [employer] (medical records) CN

## 2016-01-16 ENCOUNTER — Ambulatory Visit: Payer: 59 | Admitting: Cardiovascular Disease

## 2016-01-17 DIAGNOSIS — R799 Abnormal finding of blood chemistry, unspecified: Secondary | ICD-10-CM | POA: Diagnosis not present

## 2016-01-17 DIAGNOSIS — G609 Hereditary and idiopathic neuropathy, unspecified: Secondary | ICD-10-CM | POA: Diagnosis not present

## 2016-01-24 DIAGNOSIS — G960 Cerebrospinal fluid leak: Secondary | ICD-10-CM | POA: Diagnosis not present

## 2016-02-08 DIAGNOSIS — G960 Cerebrospinal fluid leak: Secondary | ICD-10-CM | POA: Diagnosis not present

## 2016-02-12 HISTORY — PX: CERVICAL FUSION: SHX112

## 2016-02-12 HISTORY — PX: SPINAL FUSION: SHX223

## 2016-02-16 ENCOUNTER — Encounter: Payer: Self-pay | Admitting: Cardiovascular Disease

## 2016-02-16 ENCOUNTER — Ambulatory Visit (INDEPENDENT_AMBULATORY_CARE_PROVIDER_SITE_OTHER): Payer: 59 | Admitting: Cardiovascular Disease

## 2016-02-16 VITALS — BP 137/93 | HR 80 | Ht 65.0 in | Wt 115.0 lb

## 2016-02-16 DIAGNOSIS — I2721 Secondary pulmonary arterial hypertension: Secondary | ICD-10-CM

## 2016-02-16 DIAGNOSIS — I10 Essential (primary) hypertension: Secondary | ICD-10-CM | POA: Diagnosis not present

## 2016-02-16 DIAGNOSIS — Q796 Ehlers-Danlos syndrome, unspecified: Secondary | ICD-10-CM

## 2016-02-16 DIAGNOSIS — Z8249 Family history of ischemic heart disease and other diseases of the circulatory system: Secondary | ICD-10-CM

## 2016-02-16 LAB — BASIC METABOLIC PANEL
BUN: 12 mg/dL (ref 7–25)
CO2: 28 mmol/L (ref 20–31)
Calcium: 9.3 mg/dL (ref 8.6–10.2)
Chloride: 100 mmol/L (ref 98–110)
Creat: 0.67 mg/dL (ref 0.50–1.10)
Glucose, Bld: 85 mg/dL (ref 65–99)
Potassium: 4.6 mmol/L (ref 3.5–5.3)
Sodium: 136 mmol/L (ref 135–146)

## 2016-02-16 NOTE — Patient Instructions (Signed)
Medication Instructions: Dr Sallyanne Kuster recommends that you continue on your current medications as directed. Please refer to the Current Medication list given to you today.  Labwork: Your physician recommends that you return for lab work prior to your CT.  Testing/Procedures: 1. CT Angiogram of the Chest/Aorta - Non-Cardiac CT Angiography (CTA), is a special type of CT scan that uses a computer to produce multi-dimensional views of major blood vessels throughout the body. In CT angiography, a contrast material is injected through an IV to help visualize the blood vessels. This will be performed at our [facility] location - [address].  Follow-up: Dr Sallyanne Kuster recommends that you schedule a follow-up appointment in 1 year. You will receive a reminder letter in the mail two months in advance. If you don't receive a letter, please call our office to schedule the follow-up appointment.  If you need a refill on your cardiac medications before your next appointment, please call your pharmacy.

## 2016-02-16 NOTE — Progress Notes (Signed)
Cardiology Office Note    Date:  02/16/2016   ID:  Lorraine Weber, DOB [birth year], MRN [id number]  PCP:  Glo Herring., MD  Cardiologist:  (New) Sanda Klein, MD   Chief Complaint  Patient presents with  . New Evaluation    mother had EDS and brother has it    History of Present Illness:  Lorraine Weber is a 36 y.o. female with a family history of Ehlers-Danlos syndrome with vascular manifestations and personal history of joint dislocations and skin hyperextensibility here for evaluation of possible vascular complications of EDS. Her brother had an aortic dissection around age 21 and had to undergo emergency surgery. Reportedly he has undergone genetic testing and his 2 daughters have also been confirmed to have the disorder.  She denies problems with chest pain or shortness of breath. She is relatively sedentary due to neurological problems. 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 In fact, the difficulty in stopping the leak is one of the features that led to the suspicion that she also has Ehlers-Danlos syndrome.  She had early onset hypertension in her late teens and this has been well controlled with diltiazem. She has been on a variety of other antihypertensive medications before including a beta blocker. She has no recollection why the beta blocker was stopped in the past but does not think that she had serious side effects.  She complains of occasional brief palpitations. She has not experienced syncope although she has had falls and head injuries. She denies chest pain either at rest or with activity and does not have leg edema or claudication. She bears a diagnosis of idiopathic peripheral neuropathy.  In addition to her brother with aortic dissection there is a history of hyperlipidemia, hypertension and myocardial infarction in her father who has also  had DVT and stroke. She also has a maternal aunt who has hyperlipidemia and coronary artery disease and stroke  Past Medical History:  Diagnosis Date  . Anemia   . Concussion    x8, last 2010, residual homonymous Hemianopsia  . CSF leak    multiple serum pathes  . Frequent sinus infections    tx with flonase nasal spray  . GERD (gastroesophageal reflux disease)   . History of blood transfusion    over 5 yrs ago  . Hypertension   . Mastoiditis   . Migraines   . Migraines   . Nephrolithiasis    X5, Dr Jeffie Pollock, passed stones, no surgery  . Peptic ulcer disease   . Von Willebrand disease ([place])    mild form per patient    Past Surgical History:  Procedure Laterality Date  . APPENDECTOMY    . BACK SURGERY     T5-6  . DILITATION & CURRETTAGE/HYSTROSCOPY WITH NOVASURE ABLATION N/A 08/23/2015   Procedure: DILATATION & CURETTAGE/HYSTEROSCOPY WITH NOVASURE ABLATION;  Surgeon: Vanessa Kick, MD;  Location: [facility];  Service: Gynecology;  Laterality: N/A;  . HAND TENDON SURGERY Left    left, complicated by MRSA  . spinal fluid leak patching     > 10X;[facility] - blood patching  . WISDOM TOOTH EXTRACTION      Current Medications: Outpatient Medications Prior to Visit  Medication Sig Dispense Refill  . acetaminophen (TYLENOL) 650 MG CR tablet Take 650 mg by mouth at bedtime as needed for pain.     . butalbital-acetaminophen-caffeine (FIORICET WITH CODEINE)  50-325-40-30 MG capsule Take 1 capsule by mouth every 6 (six) hours as needed for headache.    . diltiazem (DILACOR XR) 240 MG 24 hr capsule Take 240 mg by mouth daily.    . fluticasone (FLONASE) 50 MCG/ACT nasal spray Place 1 spray into both nostrils daily.    . metoCLOPramide (REGLAN) 10 MG tablet Take 10 mg by mouth 4 (four) times daily.    Marland Kitchen omeprazole (PRILOSEC) 20 MG capsule Take 20 mg by mouth daily.    Marland Kitchen losartan-hydrochlorothiazide (HYZAAR) 100-25 MG tablet Take 1 tablet by mouth daily.     No facility-administered medications  prior to visit.      Allergies:   Aspirin; Other; Topamax [topiramate]; Levofloxacin; Levofloxacin in d5w; and Sulfamethoxazole-trimethoprim   Social History   Social History  . Marital status: Married    Spouse name: N/A  . Number of children: N/A  . Years of education: N/A   Occupational History  . [employer]   Social History Main Topics  . Smoking status: Former Smoker    Packs/day: 0.30    Years: 10.00    Types: Cigarettes  . Smokeless tobacco: Never Used     Comment: smoked age 28-present, up to 1 ppd.05/24/14 1/2 ppd  . Alcohol use No  . Drug use: No  . Sexual activity: Yes    Partners: Male    Birth control/ protection: None     Comment: IUD removed 06/2015   Other Topics Concern  . None   Social History Narrative  . None     Family History:  The patient's family history includes Arthritis in her father; Diabetes in her father, maternal grandmother, and paternal grandfather; Heart disease in her brother, maternal aunt, maternal grandfather, maternal grandmother, maternal uncle, and mother; Hyperlipidemia in her father and paternal grandfather; Prostate cancer in her paternal grandfather; Stroke in her father; Stroke (age of onset: 73) in her maternal aunt.   ROS:   Please see the history of present illness.    ROS All other systems reviewed and are negative.   PHYSICAL EXAM:   VS:  BP (!) 137/93 (BP Location: Left Arm, Patient Position: Sitting, Cuff Size: Normal)   Pulse 80   Ht 5\' 5"  (1.651 m)   Wt 115 lb (52.2 kg)   BMI 19.14 kg/m    GEN: Well nourished, well developed, in no acute distress  HEENT: normal  Neck: no JVD, carotid bruits, or masses Cardiac: RRR; no murmurs, rubs, or gallops,no edema. There is no systolic click or murmur even after the Valsalva maneuver. No diastolic murmurs are heard.  Respiratory:  clear to auscultation bilaterally, normal work of breathing GI: soft, nontender, nondistended, + BS MS: no  deformity or atrophy  Skin: warm and dry, no rash Neuro:  Alert and Oriented x 3, Strength and sensation are intact Psych: euthymic mood, full affect  Wt Readings from Last 3 Encounters:  02/16/16 115 lb (52.2 kg)  08/09/15 160 lb 6 oz (72.7 kg)  02/14/15 140 lb (63.5 kg)      Studies/Labs Reviewed:   EKG:  EKG is ordered today.  The ekg ordered today demonstrates Normal sinus rhythm. Borderline QTC 454 ms  Recent Labs: 08/09/2015: BUN 9; Creatinine, Ser 0.63; Hemoglobin 12.8; Platelets 260; Potassium 4.2; Sodium 134   Lipid Panel No results found for: CHOL, TRIG, HDL, CHOLHDL, VLDL, LDLCALC, LDLDIRECT  Additional studies/ records that were reviewed today include:  Notes from [place] and  neurology offices.  Echocardiogram from August 2016 showing normal dimensions of the aortic root, mild aortic insufficiency, mild mitral insufficiency without mitral valve prolapse. Note LVEF 60-65% and normal diastolic left ventricular function. Reported elevation in systolic PA pressure 36 mmHg (RV-RA gradient 28).  ASSESSMENT:    1. Ehlers-Danlos syndrome   2. Essential hypertension   3. Family history of early CAD   28. Reported pulmonary arterial hypertension by echo      PLAN:  In order of problems listed above:  1. Ehlers-Danlos sd.: The patient has findings suggestive of Ehlers-Danlos syndrome involving the musculoskeletal syndrome and has a brother with history of aortic dissection. She should undergo CT angiography to screen for evidence of aortic aneurysm. By physical exam she does not have aortic insufficiency or mitral valve prolapse. Echo in 2016 did not show either abnormality. I've asked her to try to obtain the results of her brother's genetic testing.  2. HTN: Ideally, she should be on a beta blocker. She remembers being on a medicine that ended in "olol" when she was seeing Dr. Linna Darner and does not recall having to stop it because of side effects but rather  when she changed providers. If any aortic abnormalities are found on imaging, we'll probably switch from diltiazem to a beta blocker. If no abnormalities are found we'll probably just leave her on the medication that is working well for her right now.  3. Elevated PAP: I doubt the accuracy of the estimation of her PA pressure on the echo from 2016. I think the right atrial pressure was overestimated. Her true systolic PA pressure was probably around 30 mmHg, normal range.  4. FHx CAD: We'll try to get updated lipid profile results    Medication Adjustments/Labs and Tests Ordered: Current medicines are reviewed at length with the patient today.  Concerns regarding medicines are outlined above.  Medication changes, Labs and Tests ordered today are listed in the Patient Instructions below. Patient Instructions  Medication Instructions: Dr Sallyanne Kuster recommends that you continue on your current medications as directed. Please refer to the Current Medication list given to you today.  Labwork: Your physician recommends that you return for lab work prior to your CT.  Testing/Procedures: 1. CT Angiogram of the Chest/Aorta - Non-Cardiac CT Angiography (CTA), is a special type of CT scan that uses a computer to produce multi-dimensional views of major blood vessels throughout the body. In CT angiography, a contrast material is injected through an IV to help visualize the blood vessels. This will be performed at our [facility] location - [address], [address].  Follow-up: Dr Sallyanne Kuster recommends that you schedule a follow-up appointment in 1 year. You will receive a reminder letter in the mail two months in advance. If you don't receive a letter, please call our office to schedule the follow-up appointment.  If you need a refill on your cardiac medications before your next appointment, please call your pharmacy.    Signed, Sanda Klein, MD  02/16/2016 6:21 PM    [facility] [address], [place], [place]  [address] Phone: [phone]; Fax: [phone]

## 2016-02-21 ENCOUNTER — Ambulatory Visit (INDEPENDENT_AMBULATORY_CARE_PROVIDER_SITE_OTHER)
Admission: RE | Admit: 2016-02-21 | Discharge: 2016-02-21 | Disposition: A | Discharge status: Home / Self Care | Payer: 59 | Source: Ambulatory Visit | Attending: Cardiovascular Disease | Admitting: Cardiovascular Disease

## 2016-02-21 DIAGNOSIS — Q796 Ehlers-Danlos syndrome, unspecified: Secondary | ICD-10-CM

## 2016-02-21 MED ORDER — IOPAMIDOL (ISOVUE-370) INJECTION 76%
100.0000 mL | Freq: Once | INTRAVENOUS | Status: AC | PRN
  Administered 2016-02-21: 100 mL via INTRAVENOUS

## 2016-03-11 DIAGNOSIS — J329 Chronic sinusitis, unspecified: Secondary | ICD-10-CM | POA: Diagnosis not present

## 2016-03-11 DIAGNOSIS — Z6826 Body mass index (BMI) 26.0-26.9, adult: Secondary | ICD-10-CM | POA: Diagnosis not present

## 2016-03-11 DIAGNOSIS — R070 Pain in throat: Secondary | ICD-10-CM | POA: Diagnosis not present

## 2016-03-11 DIAGNOSIS — J069 Acute upper respiratory infection, unspecified: Secondary | ICD-10-CM | POA: Diagnosis not present

## 2016-03-11 DIAGNOSIS — E669 Obesity, unspecified: Secondary | ICD-10-CM | POA: Diagnosis not present

## 2016-03-11 DIAGNOSIS — J343 Hypertrophy of nasal turbinates: Secondary | ICD-10-CM | POA: Diagnosis not present

## 2016-03-11 DIAGNOSIS — Z1389 Encounter for screening for other disorder: Secondary | ICD-10-CM | POA: Diagnosis not present

## 2016-03-11 DIAGNOSIS — E663 Overweight: Secondary | ICD-10-CM | POA: Diagnosis not present

## 2016-04-04 DIAGNOSIS — Z6826 Body mass index (BMI) 26.0-26.9, adult: Secondary | ICD-10-CM | POA: Diagnosis not present

## 2016-04-04 DIAGNOSIS — Z1389 Encounter for screening for other disorder: Secondary | ICD-10-CM | POA: Diagnosis not present

## 2016-04-04 DIAGNOSIS — N3000 Acute cystitis without hematuria: Secondary | ICD-10-CM | POA: Diagnosis not present

## 2016-04-04 DIAGNOSIS — G43909 Migraine, unspecified, not intractable, without status migrainosus: Secondary | ICD-10-CM | POA: Diagnosis not present

## 2016-04-04 DIAGNOSIS — E538 Deficiency of other specified B group vitamins: Secondary | ICD-10-CM | POA: Diagnosis not present

## 2016-04-04 DIAGNOSIS — I10 Essential (primary) hypertension: Secondary | ICD-10-CM | POA: Diagnosis not present

## 2016-04-09 DIAGNOSIS — N342 Other urethritis: Secondary | ICD-10-CM | POA: Diagnosis not present

## 2016-04-09 DIAGNOSIS — Z1389 Encounter for screening for other disorder: Secondary | ICD-10-CM | POA: Diagnosis not present

## 2016-04-09 DIAGNOSIS — Z6827 Body mass index (BMI) 27.0-27.9, adult: Secondary | ICD-10-CM | POA: Diagnosis not present

## 2016-04-24 DIAGNOSIS — Z01818 Encounter for other preprocedural examination: Secondary | ICD-10-CM | POA: Diagnosis not present

## 2016-04-24 DIAGNOSIS — D680 Von Willebrand's disease: Secondary | ICD-10-CM | POA: Diagnosis not present

## 2016-04-24 DIAGNOSIS — Q796 Ehlers-Danlos syndrome, unspecified: Secondary | ICD-10-CM | POA: Insufficient documentation

## 2016-04-24 DIAGNOSIS — I10 Essential (primary) hypertension: Secondary | ICD-10-CM | POA: Diagnosis not present

## 2016-04-24 DIAGNOSIS — K219 Gastro-esophageal reflux disease without esophagitis: Secondary | ICD-10-CM | POA: Diagnosis not present

## 2016-04-24 DIAGNOSIS — K279 Peptic ulcer, site unspecified, unspecified as acute or chronic, without hemorrhage or perforation: Secondary | ICD-10-CM | POA: Diagnosis not present

## 2016-04-24 DIAGNOSIS — G960 Cerebrospinal fluid leak: Secondary | ICD-10-CM | POA: Diagnosis not present

## 2016-04-26 ENCOUNTER — Encounter: Payer: Self-pay | Admitting: Cardiovascular Disease

## 2016-04-26 ENCOUNTER — Other Ambulatory Visit: Payer: Self-pay

## 2016-04-26 ENCOUNTER — Telehealth: Payer: Self-pay | Admitting: Cardiovascular Disease

## 2016-04-26 ENCOUNTER — Telehealth: Payer: Self-pay

## 2016-04-26 ENCOUNTER — Other Ambulatory Visit: Payer: Self-pay | Admitting: Cardiovascular Disease

## 2016-04-26 DIAGNOSIS — Z8249 Family history of ischemic heart disease and other diseases of the circulatory system: Secondary | ICD-10-CM

## 2016-04-26 NOTE — Telephone Encounter (Signed)
Forward /deferred  To Dr [employer]

## 2016-04-26 NOTE — Telephone Encounter (Signed)
Opened in error

## 2016-04-26 NOTE — Telephone Encounter (Signed)
New message    Lorraine Weber from [facility] is calling saying she faxed over clearance for pt.   Request for surgical clearance:  1. What type of surgery is being performed? Posterior spinal fusion t12-L1  When is this surgery scheduled? Thursday March 22  2. Are there any medications that need to be held prior to surgery and how long? no  3. Name of physician performing surgery? Myrtis Ser   4. What is your office phone and fax number? [phone]

## 2016-04-26 NOTE — Telephone Encounter (Signed)
LEFT VOICEMAIL MESSAGE TO INFORM LETTER FOR  CLEARANCE ROUTED TO THE GIVEN FAX NUMBER

## 2016-04-26 NOTE — Telephone Encounter (Signed)
Epicd 

## 2016-05-02 DIAGNOSIS — N189 Chronic kidney disease, unspecified: Secondary | ICD-10-CM | POA: Diagnosis not present

## 2016-05-02 DIAGNOSIS — G960 Cerebrospinal fluid leak: Secondary | ICD-10-CM | POA: Diagnosis not present

## 2016-05-02 DIAGNOSIS — Z87442 Personal history of urinary calculi: Secondary | ICD-10-CM | POA: Diagnosis not present

## 2016-05-02 DIAGNOSIS — K279 Peptic ulcer, site unspecified, unspecified as acute or chronic, without hemorrhage or perforation: Secondary | ICD-10-CM | POA: Diagnosis not present

## 2016-05-02 DIAGNOSIS — R110 Nausea: Secondary | ICD-10-CM | POA: Diagnosis present

## 2016-05-02 DIAGNOSIS — Z8614 Personal history of Methicillin resistant Staphylococcus aureus infection: Secondary | ICD-10-CM | POA: Diagnosis not present

## 2016-05-02 DIAGNOSIS — R51 Headache: Secondary | ICD-10-CM | POA: Diagnosis not present

## 2016-05-02 DIAGNOSIS — I129 Hypertensive chronic kidney disease with stage 1 through stage 4 chronic kidney disease, or unspecified chronic kidney disease: Secondary | ICD-10-CM | POA: Diagnosis not present

## 2016-05-02 DIAGNOSIS — K219 Gastro-esophageal reflux disease without esophagitis: Secondary | ICD-10-CM | POA: Diagnosis not present

## 2016-05-02 DIAGNOSIS — Z8711 Personal history of peptic ulcer disease: Secondary | ICD-10-CM | POA: Diagnosis not present

## 2016-05-02 DIAGNOSIS — I10 Essential (primary) hypertension: Secondary | ICD-10-CM | POA: Diagnosis present

## 2016-05-02 DIAGNOSIS — Z981 Arthrodesis status: Secondary | ICD-10-CM | POA: Diagnosis not present

## 2016-05-02 DIAGNOSIS — Q796 Ehlers-Danlos syndrome: Secondary | ICD-10-CM | POA: Diagnosis not present

## 2016-05-02 DIAGNOSIS — D680 Von Willebrand's disease: Secondary | ICD-10-CM | POA: Diagnosis present

## 2016-06-05 DIAGNOSIS — H532 Diplopia: Secondary | ICD-10-CM | POA: Diagnosis not present

## 2016-06-05 DIAGNOSIS — G960 Cerebrospinal fluid leak: Secondary | ICD-10-CM | POA: Diagnosis not present

## 2016-06-05 DIAGNOSIS — R42 Dizziness and giddiness: Secondary | ICD-10-CM | POA: Diagnosis not present

## 2016-06-05 DIAGNOSIS — M5124 Other intervertebral disc displacement, thoracic region: Secondary | ICD-10-CM | POA: Diagnosis not present

## 2016-06-05 DIAGNOSIS — R51 Headache: Secondary | ICD-10-CM | POA: Diagnosis not present

## 2016-06-06 DIAGNOSIS — G960 Cerebrospinal fluid leak: Secondary | ICD-10-CM | POA: Diagnosis not present

## 2016-06-19 DIAGNOSIS — M50223 Other cervical disc displacement at C6-C7 level: Secondary | ICD-10-CM | POA: Diagnosis not present

## 2016-06-19 DIAGNOSIS — G960 Cerebrospinal fluid leak: Secondary | ICD-10-CM | POA: Diagnosis not present

## 2016-06-19 DIAGNOSIS — M4802 Spinal stenosis, cervical region: Secondary | ICD-10-CM | POA: Diagnosis not present

## 2016-06-19 DIAGNOSIS — M47892 Other spondylosis, cervical region: Secondary | ICD-10-CM | POA: Diagnosis not present

## 2016-06-28 ENCOUNTER — Other Ambulatory Visit: Payer: Self-pay | Admitting: Cardiovascular Disease

## 2016-06-29 LAB — LIPID PANEL W/O CHOL/HDL RATIO
Cholesterol, Total: 128 mg/dL (ref 100–199)
HDL: 48 mg/dL (ref >39)
LDL Calculated: 60 mg/dL (ref 0–99)
Triglycerides: 98 mg/dL (ref 0–149)
VLDL Cholesterol Cal: 20 mg/dL (ref 5–40)

## 2016-07-01 ENCOUNTER — Other Ambulatory Visit: Payer: Self-pay | Admitting: Obstetrics and Gynecology

## 2016-07-01 DIAGNOSIS — Z01419 Encounter for gynecological examination (general) (routine) without abnormal findings: Secondary | ICD-10-CM | POA: Diagnosis not present

## 2016-07-01 DIAGNOSIS — Z124 Encounter for screening for malignant neoplasm of cervix: Secondary | ICD-10-CM | POA: Diagnosis not present

## 2016-07-03 LAB — CYTOLOGY - PAP

## 2016-07-06 DIAGNOSIS — N764 Abscess of vulva: Secondary | ICD-10-CM | POA: Diagnosis not present

## 2016-09-04 DIAGNOSIS — M542 Cervicalgia: Secondary | ICD-10-CM | POA: Diagnosis not present

## 2016-09-04 DIAGNOSIS — M5412 Radiculopathy, cervical region: Secondary | ICD-10-CM | POA: Diagnosis not present

## 2016-09-04 DIAGNOSIS — Q796 Ehlers-Danlos syndrome: Secondary | ICD-10-CM | POA: Diagnosis not present

## 2016-09-04 DIAGNOSIS — M4712 Other spondylosis with myelopathy, cervical region: Secondary | ICD-10-CM | POA: Diagnosis not present

## 2016-09-04 DIAGNOSIS — I10 Essential (primary) hypertension: Secondary | ICD-10-CM | POA: Diagnosis not present

## 2016-09-04 DIAGNOSIS — G959 Disease of spinal cord, unspecified: Secondary | ICD-10-CM | POA: Diagnosis not present

## 2016-09-04 DIAGNOSIS — K219 Gastro-esophageal reflux disease without esophagitis: Secondary | ICD-10-CM | POA: Diagnosis not present

## 2016-09-04 DIAGNOSIS — Z01818 Encounter for other preprocedural examination: Secondary | ICD-10-CM | POA: Diagnosis not present

## 2016-09-05 DIAGNOSIS — K219 Gastro-esophageal reflux disease without esophagitis: Secondary | ICD-10-CM | POA: Diagnosis not present

## 2016-09-05 DIAGNOSIS — Z881 Allergy status to other antibiotic agents status: Secondary | ICD-10-CM | POA: Diagnosis not present

## 2016-09-05 DIAGNOSIS — M4712 Other spondylosis with myelopathy, cervical region: Secondary | ICD-10-CM | POA: Diagnosis not present

## 2016-09-05 DIAGNOSIS — Z882 Allergy status to sulfonamides status: Secondary | ICD-10-CM | POA: Diagnosis not present

## 2016-09-05 DIAGNOSIS — M4322 Fusion of spine, cervical region: Secondary | ICD-10-CM | POA: Insufficient documentation

## 2016-09-05 DIAGNOSIS — Z79899 Other long term (current) drug therapy: Secondary | ICD-10-CM | POA: Diagnosis not present

## 2016-09-05 DIAGNOSIS — I10 Essential (primary) hypertension: Secondary | ICD-10-CM | POA: Diagnosis not present

## 2016-09-05 DIAGNOSIS — Z888 Allergy status to other drugs, medicaments and biological substances status: Secondary | ICD-10-CM | POA: Diagnosis not present

## 2016-09-05 DIAGNOSIS — Z862 Personal history of diseases of the blood and blood-forming organs and certain disorders involving the immune mechanism: Secondary | ICD-10-CM | POA: Diagnosis not present

## 2016-09-06 DIAGNOSIS — K219 Gastro-esophageal reflux disease without esophagitis: Secondary | ICD-10-CM | POA: Diagnosis not present

## 2016-09-06 DIAGNOSIS — M4712 Other spondylosis with myelopathy, cervical region: Secondary | ICD-10-CM | POA: Diagnosis not present

## 2016-09-06 DIAGNOSIS — Z881 Allergy status to other antibiotic agents status: Secondary | ICD-10-CM | POA: Diagnosis not present

## 2016-09-06 DIAGNOSIS — Z888 Allergy status to other drugs, medicaments and biological substances status: Secondary | ICD-10-CM | POA: Diagnosis not present

## 2016-09-06 DIAGNOSIS — I10 Essential (primary) hypertension: Secondary | ICD-10-CM | POA: Diagnosis not present

## 2016-09-06 DIAGNOSIS — Z882 Allergy status to sulfonamides status: Secondary | ICD-10-CM | POA: Diagnosis not present

## 2016-09-13 DIAGNOSIS — J209 Acute bronchitis, unspecified: Secondary | ICD-10-CM | POA: Diagnosis not present

## 2016-09-13 DIAGNOSIS — Z6827 Body mass index (BMI) 27.0-27.9, adult: Secondary | ICD-10-CM | POA: Diagnosis not present

## 2016-09-13 DIAGNOSIS — Z1389 Encounter for screening for other disorder: Secondary | ICD-10-CM | POA: Diagnosis not present

## 2016-09-13 DIAGNOSIS — J069 Acute upper respiratory infection, unspecified: Secondary | ICD-10-CM | POA: Diagnosis not present

## 2016-09-13 DIAGNOSIS — E663 Overweight: Secondary | ICD-10-CM | POA: Diagnosis not present

## 2016-10-04 DIAGNOSIS — M50221 Other cervical disc displacement at C4-C5 level: Secondary | ICD-10-CM | POA: Diagnosis not present

## 2016-10-04 DIAGNOSIS — M5124 Other intervertebral disc displacement, thoracic region: Secondary | ICD-10-CM | POA: Diagnosis not present

## 2016-10-04 DIAGNOSIS — M4324 Fusion of spine, thoracic region: Secondary | ICD-10-CM | POA: Diagnosis not present

## 2016-10-04 DIAGNOSIS — M4322 Fusion of spine, cervical region: Secondary | ICD-10-CM | POA: Diagnosis not present

## 2016-10-10 DIAGNOSIS — R51 Headache: Secondary | ICD-10-CM | POA: Diagnosis not present

## 2016-10-10 DIAGNOSIS — M50221 Other cervical disc displacement at C4-C5 level: Secondary | ICD-10-CM | POA: Diagnosis not present

## 2016-10-10 DIAGNOSIS — H532 Diplopia: Secondary | ICD-10-CM | POA: Diagnosis not present

## 2016-10-10 DIAGNOSIS — M5124 Other intervertebral disc displacement, thoracic region: Secondary | ICD-10-CM | POA: Diagnosis not present

## 2016-10-10 DIAGNOSIS — Z981 Arthrodesis status: Secondary | ICD-10-CM | POA: Diagnosis not present

## 2016-10-10 DIAGNOSIS — G960 Cerebrospinal fluid leak: Secondary | ICD-10-CM | POA: Diagnosis not present

## 2016-10-16 DIAGNOSIS — M48062 Spinal stenosis, lumbar region with neurogenic claudication: Secondary | ICD-10-CM | POA: Diagnosis not present

## 2016-10-16 DIAGNOSIS — G960 Cerebrospinal fluid leak: Secondary | ICD-10-CM | POA: Diagnosis not present

## 2016-10-16 DIAGNOSIS — M5412 Radiculopathy, cervical region: Secondary | ICD-10-CM | POA: Diagnosis not present

## 2016-11-01 DIAGNOSIS — R5383 Other fatigue: Secondary | ICD-10-CM | POA: Diagnosis not present

## 2016-11-01 DIAGNOSIS — R635 Abnormal weight gain: Secondary | ICD-10-CM | POA: Diagnosis not present

## 2016-11-01 DIAGNOSIS — R7989 Other specified abnormal findings of blood chemistry: Secondary | ICD-10-CM | POA: Diagnosis not present

## 2016-11-01 DIAGNOSIS — R609 Edema, unspecified: Secondary | ICD-10-CM | POA: Diagnosis not present

## 2016-11-11 DIAGNOSIS — R51 Headache: Secondary | ICD-10-CM | POA: Diagnosis not present

## 2016-11-11 DIAGNOSIS — R112 Nausea with vomiting, unspecified: Secondary | ICD-10-CM | POA: Diagnosis not present

## 2016-11-11 DIAGNOSIS — G960 Cerebrospinal fluid leak: Secondary | ICD-10-CM | POA: Diagnosis not present

## 2016-11-11 DIAGNOSIS — H9319 Tinnitus, unspecified ear: Secondary | ICD-10-CM | POA: Diagnosis not present

## 2016-11-11 DIAGNOSIS — R42 Dizziness and giddiness: Secondary | ICD-10-CM | POA: Diagnosis not present

## 2016-11-24 DIAGNOSIS — W010XXA Fall on same level from slipping, tripping and stumbling without subsequent striking against object, initial encounter: Secondary | ICD-10-CM | POA: Diagnosis not present

## 2016-11-24 DIAGNOSIS — I10 Essential (primary) hypertension: Secondary | ICD-10-CM | POA: Diagnosis not present

## 2016-11-24 DIAGNOSIS — M542 Cervicalgia: Secondary | ICD-10-CM | POA: Diagnosis not present

## 2016-11-24 DIAGNOSIS — Z981 Arthrodesis status: Secondary | ICD-10-CM | POA: Diagnosis not present

## 2016-11-24 DIAGNOSIS — S060X9A Concussion with loss of consciousness of unspecified duration, initial encounter: Secondary | ICD-10-CM | POA: Diagnosis not present

## 2016-11-24 DIAGNOSIS — W19XXXA Unspecified fall, initial encounter: Secondary | ICD-10-CM | POA: Diagnosis not present

## 2016-11-24 DIAGNOSIS — Z87891 Personal history of nicotine dependence: Secondary | ICD-10-CM | POA: Diagnosis not present

## 2016-11-28 DIAGNOSIS — R531 Weakness: Secondary | ICD-10-CM | POA: Diagnosis not present

## 2016-11-28 DIAGNOSIS — M4322 Fusion of spine, cervical region: Secondary | ICD-10-CM | POA: Diagnosis not present

## 2016-11-28 DIAGNOSIS — S060X9A Concussion with loss of consciousness of unspecified duration, initial encounter: Secondary | ICD-10-CM | POA: Diagnosis not present

## 2016-11-28 DIAGNOSIS — M4324 Fusion of spine, thoracic region: Secondary | ICD-10-CM | POA: Diagnosis not present

## 2016-12-10 DIAGNOSIS — Q796 Ehlers-Danlos syndrome: Secondary | ICD-10-CM | POA: Diagnosis not present

## 2016-12-10 DIAGNOSIS — D229 Melanocytic nevi, unspecified: Secondary | ICD-10-CM | POA: Diagnosis not present

## 2016-12-10 DIAGNOSIS — L700 Acne vulgaris: Secondary | ICD-10-CM | POA: Diagnosis not present

## 2017-01-07 DIAGNOSIS — H9319 Tinnitus, unspecified ear: Secondary | ICD-10-CM | POA: Diagnosis not present

## 2017-01-07 DIAGNOSIS — R112 Nausea with vomiting, unspecified: Secondary | ICD-10-CM | POA: Diagnosis not present

## 2017-01-07 DIAGNOSIS — G960 Cerebrospinal fluid leak: Secondary | ICD-10-CM | POA: Diagnosis not present

## 2017-01-07 DIAGNOSIS — H532 Diplopia: Secondary | ICD-10-CM | POA: Diagnosis not present

## 2017-01-15 DIAGNOSIS — I10 Essential (primary) hypertension: Secondary | ICD-10-CM | POA: Diagnosis not present

## 2017-01-15 DIAGNOSIS — G44329 Chronic post-traumatic headache, not intractable: Secondary | ICD-10-CM | POA: Diagnosis not present

## 2017-01-15 DIAGNOSIS — E748 Other specified disorders of carbohydrate metabolism: Secondary | ICD-10-CM | POA: Diagnosis not present

## 2017-01-15 DIAGNOSIS — J329 Chronic sinusitis, unspecified: Secondary | ICD-10-CM | POA: Diagnosis not present

## 2017-01-15 DIAGNOSIS — Z1389 Encounter for screening for other disorder: Secondary | ICD-10-CM | POA: Diagnosis not present

## 2017-01-15 DIAGNOSIS — G8190 Hemiplegia, unspecified affecting unspecified side: Secondary | ICD-10-CM | POA: Diagnosis not present

## 2017-01-15 DIAGNOSIS — R300 Dysuria: Secondary | ICD-10-CM | POA: Diagnosis not present

## 2017-01-15 DIAGNOSIS — G43909 Migraine, unspecified, not intractable, without status migrainosus: Secondary | ICD-10-CM | POA: Diagnosis not present

## 2017-01-15 DIAGNOSIS — Z6827 Body mass index (BMI) 27.0-27.9, adult: Secondary | ICD-10-CM | POA: Diagnosis not present

## 2017-01-15 DIAGNOSIS — E663 Overweight: Secondary | ICD-10-CM | POA: Diagnosis not present

## 2017-01-16 DIAGNOSIS — I160 Hypertensive urgency: Secondary | ICD-10-CM | POA: Diagnosis not present

## 2017-01-16 DIAGNOSIS — Z1389 Encounter for screening for other disorder: Secondary | ICD-10-CM | POA: Diagnosis not present

## 2017-01-27 DIAGNOSIS — I160 Hypertensive urgency: Secondary | ICD-10-CM | POA: Diagnosis not present

## 2017-01-27 DIAGNOSIS — Z1389 Encounter for screening for other disorder: Secondary | ICD-10-CM | POA: Diagnosis not present

## 2017-02-03 DIAGNOSIS — G960 Cerebrospinal fluid leak: Secondary | ICD-10-CM | POA: Diagnosis not present

## 2017-02-12 DIAGNOSIS — I10 Essential (primary) hypertension: Secondary | ICD-10-CM | POA: Diagnosis not present

## 2017-02-12 DIAGNOSIS — N200 Calculus of kidney: Secondary | ICD-10-CM | POA: Diagnosis not present

## 2017-02-12 DIAGNOSIS — I700 Atherosclerosis of aorta: Secondary | ICD-10-CM | POA: Diagnosis not present

## 2017-02-25 DIAGNOSIS — J069 Acute upper respiratory infection, unspecified: Secondary | ICD-10-CM | POA: Diagnosis not present

## 2017-02-25 DIAGNOSIS — J0140 Acute pansinusitis, unspecified: Secondary | ICD-10-CM | POA: Diagnosis not present

## 2017-03-04 DIAGNOSIS — J019 Acute sinusitis, unspecified: Secondary | ICD-10-CM | POA: Diagnosis not present

## 2017-03-11 DIAGNOSIS — D229 Melanocytic nevi, unspecified: Secondary | ICD-10-CM | POA: Diagnosis not present

## 2017-03-11 DIAGNOSIS — Q796 Ehlers-Danlos syndrome: Secondary | ICD-10-CM | POA: Diagnosis not present

## 2017-03-11 DIAGNOSIS — L700 Acne vulgaris: Secondary | ICD-10-CM | POA: Diagnosis not present

## 2017-03-17 ENCOUNTER — Ambulatory Visit (INDEPENDENT_AMBULATORY_CARE_PROVIDER_SITE_OTHER): Payer: Medicare Other | Admitting: Physician Assistant

## 2017-03-17 ENCOUNTER — Encounter: Payer: Self-pay | Admitting: Physician Assistant

## 2017-03-17 VITALS — BP 142/68 | HR 88 | Ht 65.0 in | Wt 170.0 lb

## 2017-03-17 DIAGNOSIS — R002 Palpitations: Secondary | ICD-10-CM

## 2017-03-17 DIAGNOSIS — G9600 Cerebrospinal fluid leak, unspecified: Secondary | ICD-10-CM

## 2017-03-17 DIAGNOSIS — Q796 Ehlers-Danlos syndrome, unspecified: Secondary | ICD-10-CM

## 2017-03-17 DIAGNOSIS — I10 Essential (primary) hypertension: Secondary | ICD-10-CM

## 2017-03-17 MED ORDER — DILTIAZEM HCL ER COATED BEADS 120 MG PO CP24: 120.0000 mg | ORAL_CAPSULE | Freq: Every day | ORAL | 0 refills | Status: DC

## 2017-03-17 MED ORDER — METOPROLOL SUCCINATE ER 25 MG PO TB24: 25.0000 mg | ORAL_TABLET | Freq: Every day | ORAL | 0 refills | Status: DC

## 2017-03-17 NOTE — Progress Notes (Signed)
Cardiology Office Note    Date:  03/19/2017   ID:  Lorraine Weber, DOB 1981-01-16, MRN [id number]  PCP:  Redmond School, MD  Cardiologist:  Dr. Sallyanne Kuster   Chief Complaint  Patient presents with  . Palpitations    With increase B/P. Seen for Dr. Sallyanne Kuster    History of Present Illness:  Lorraine Weber is a 37 y.o. female with PMH of Ehlers-Danlos syndrome with vascular manifestation and history of joint dislocation in the skin hyper extensibility.  Her brother had aortic dissection around age 79 and underwent emergency surgery.  He reportedly undergone genetic testing and a his 2 daughters also has been confirmed to have the disorder.  She was last seen by Dr. Sallyanne Kuster on 02/16/2016 for evaluation of possible vascular complication of Ehlers-Danlos syndrome.  At that time she denies any chest pain.  She is relatively sedentary due to a neurological problem.  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  It was the difficulty in stopping the leak as one the features that led to the suspicion that she had earlier Danlos syndrome.  CTA of chest and aorta obtained on 02/21/2016 showed no evidence of aortic aneurysm or dissection.  Due to occipital headache and double vision related to intracranial hypertension, she had CT-guided epidural patches in December 2018.  Patient presents today for cardiology office visit.  She denies any significant chest discomfort or or shortness of breath.  She does have occasional mid back discomfort radiating to the right flank.  She says this back pain is very mild and may go away for several weeks without recurring.  Symptom is atypical for dissection at this time.  I will hold off on CTA of the chest and abdomen.  Otherwise, she has good symmetrical pulses in bilateral lower extremity.  Her blood pressure has been elevated, I will switch her diltiazem to metoprolol  succinate.  I decreased her diltiazem to 120 mg daily today while adding metoprolol succinate 25 mg daily.  I will bring her back in 1 month, at which time I would discontinue the diltiazem and increase metoprolol succinate accordingly.  She will monitor her blood pressure at home and the let us know if she has significant hypotension.    Past Medical History:  Diagnosis Date  . Anemia   . Concussion    x8, last 2010, residual homonymous Hemianopsia  . CSF leak    multiple serum pathes  . Frequent sinus infections    tx with flonase nasal spray  . GERD (gastroesophageal reflux disease)   . History of blood transfusion    over 5 yrs ago  . Hypertension   . Mastoiditis   . Migraines   . Migraines   . Nephrolithiasis    X5, Dr Jeffie Pollock, passed stones, no surgery  . Peptic ulcer disease   . Von Willebrand disease ([place])    mild form per patient    Past Surgical History:  Procedure Laterality Date  . APPENDECTOMY    . BACK SURGERY     T5-6  . DILITATION & CURRETTAGE/HYSTROSCOPY WITH NOVASURE ABLATION N/A 08/23/2015   Procedure: DILATATION & CURETTAGE/HYSTEROSCOPY WITH NOVASURE ABLATION;  Surgeon: Vanessa Kick, MD;  Location: [facility];  Service: Gynecology;  Laterality: N/A;  . HAND TENDON SURGERY Left    left, complicated by MRSA  . spinal fluid leak patching     > 10X;[facility] - blood  patching  . WISDOM TOOTH EXTRACTION      Current Medications: Outpatient Medications Prior to Visit  Medication Sig Dispense Refill  . acetaminophen (TYLENOL) 650 MG CR tablet Take 650 mg by mouth at bedtime as needed for pain.     . butalbital-acetaminophen-caffeine (FIORICET WITH CODEINE) 50-325-40-30 MG capsule Take 1 capsule by mouth every 6 (six) hours as needed for headache.    . fluticasone (FLONASE) 50 MCG/ACT nasal spray Place 1 spray into both nostrils daily.    Marland Kitchen omeprazole (PRILOSEC) 20 MG capsule Take 20 mg by mouth daily.    . ondansetron (ZOFRAN) 4 MG tablet TAKE 1 TABLET EVERY 8 HOURS AS  NEEDED FOR NAUSEA  0  . diltiazem (DILACOR XR) 240 MG 24 hr capsule Take 240 mg by mouth daily.    . metoCLOPramide (REGLAN) 10 MG tablet Take 10 mg by mouth 4 (four) times daily.    Marland Kitchen diltiazem (CARDIZEM CD) 120 MG 24 hr capsule Take 120 mg by mouth daily.  3   No facility-administered medications prior to visit.      Allergies:   Aspirin; Other; Topamax [topiramate]; Levofloxacin; Levofloxacin in d5w; and Sulfamethoxazole-trimethoprim   Social History   Socioeconomic History  . Marital status: Married    Spouse name: None  . Number of children: None  . Years of education: None  . Highest education level: None  Social Needs  . Financial resource strain: None  . Food insecurity - worry: None  . Food insecurity - inability: None  . Transportation needs - medical: None  . Transportation needs - non-medical: None  Occupational History  . Occupation: [employer]    Employer: [employer]  Tobacco Use  . Smoking status: Former Smoker    Packs/day: 0.30    Years: 10.00    Pack years: 3.00    Types: Cigarettes  . Smokeless tobacco: Never Used  . Tobacco comment: smoked age 67-present, up to 1 ppd.05/24/14 1/2 ppd  Substance and Sexual Activity  . Alcohol use: No    Alcohol/week: 0.0 oz  . Drug use: No  . Sexual activity: Yes    Partners: Male    Birth control/protection: None    Comment: IUD removed 06/2015  Other Topics Concern  . None  Social History Narrative  . None     Family History:  The patient's family history includes Arthritis in her father; Diabetes in her father, maternal grandmother, and paternal grandfather; Heart disease in her brother, maternal aunt, maternal grandfather, maternal grandmother, maternal uncle, and mother; Hyperlipidemia in her father and paternal grandfather; Other in her unknown relative; Prostate cancer in her paternal grandfather; Stroke in her father; Stroke (age of onset: 62) in her maternal aunt.   ROS:   Please see the  history of present illness.    ROS All other systems reviewed and are negative.   PHYSICAL EXAM:   VS:  BP (!) 142/68   Pulse 88   Ht 5\' 5"  (1.651 m)   Wt 170 lb (77.1 kg)   BMI 28.29 kg/m    GEN: Well nourished, well developed, in no acute distress  HEENT: normal  Neck: no JVD, carotid bruits, or masses Cardiac: RRR; no murmurs, rubs, or gallops,no edema  Respiratory:  clear to auscultation bilaterally, normal work of breathing GI: soft, nontender, nondistended, + BS MS: no deformity or atrophy  Skin: warm and dry, no rash Neuro:  Alert and Oriented x 3, Strength and sensation are intact Psych:  euthymic mood, full affect  Wt Readings from Last 3 Encounters:  03/17/17 170 lb (77.1 kg)  02/16/16 115 lb (52.2 kg)  08/09/15 160 lb 6 oz (72.7 kg)      Studies/Labs Reviewed:   EKG:  EKG is ordered today.  The ekg ordered today demonstrates normal sinus rhythm, no significant ST-T wave changes  Recent Labs: No results found for requested labs within last 8760 hours.   Lipid Panel    Component Value Date/Time   CHOL 128 06/28/2016 1531   TRIG 98 06/28/2016 1531   HDL 48 06/28/2016 1531   LDLCALC 60 06/28/2016 1531    Additional studies/ records that were reviewed today include:  Echo 10/05/2014 LV EF: 60% -   65%  Study Conclusions  - Left ventricle: The cavity size was normal. Systolic function was   normal. The estimated ejection fraction was in the range of 60%   to 65%. Wall motion was normal; there were no regional wall   motion abnormalities. There was no evidence of elevated   ventricular filling pressure by Doppler parameters. - Aortic valve: Trileaflet; mildly thickened leaflets. There was   mild regurgitation. - Mitral valve: There was mild regurgitation. - Pulmonary arteries: Systolic pressure was mildly increased. PA   peak pressure: 36 mm Hg (S).    ASSESSMENT:    1. Essential hypertension   2. Palpitations   3. Ehlers-Danlos syndrome   4.  CSF leak      PLAN:  In order of problems listed above:  1. Hypertension: Blood pressure mildly elevated, I will transition her from diltiazem to metoprolol.  I decreased her diltiazem to 120 mg daily.  I added metoprolol succinate 25 mg daily.  2. Palpitation: Well-controlled, no significant palpitation recently  3. Ehlers-Danlos syndrome: Her brother also tested positive.  Previous CTA of the chest did not show any dissection.  Although she has intermittent back pain, however it may go away for several weeks without recurrent.  She has good symmetrical lower extremity pulse, I will hold off on repeating a CTA at this time.  4. CSF leak: Followed by [facility], underwent multiple injections blood patch.  Low CSF pressure.    Medication Adjustments/Labs and Tests Ordered: Current medicines are reviewed at length with the patient today.  Concerns regarding medicines are outlined above.  Medication changes, Labs and Tests ordered today are listed in the Patient Instructions below. Patient Instructions  Medication Instructions:  DECREASE Dilitizem to 120mg  take 1 tablet once a day for 30 days  START Metoprolol Succinate 25mg  take 1 tablet once a day  Labwork: None   Testing/Procedures: None   Follow-Up: Your physician recommends that you schedule a follow-up appointment in: 3-4 weeks with Almyra Deforest, PA-C  Any Other Special Instructions Will Be Listed Below (If Applicable). If you need a refill on your cardiac medications before your next appointment, please call your pharmacy.      Hilbert Corrigan, [place]  03/19/2017 1:55 PM    [facility] [place], [place], [place]  [address] Phone: [phone]; Fax: [phone]

## 2017-03-17 NOTE — Patient Instructions (Signed)
Medication Instructions:  DECREASE Dilitizem to 120mg  take 1 tablet once a day for 30 days  START Metoprolol Succinate 25mg  take 1 tablet once a day  Labwork: None   Testing/Procedures: None   Follow-Up: Your physician recommends that you schedule a follow-up appointment in: 3-4 weeks with Almyra Deforest, PA-C  Any Other Special Instructions Will Be Listed Below (If Applicable). If you need a refill on your cardiac medications before your next appointment, please call your pharmacy.

## 2017-03-19 ENCOUNTER — Encounter: Payer: Self-pay | Admitting: Physician Assistant

## 2017-04-14 ENCOUNTER — Ambulatory Visit: Payer: Medicare Other | Admitting: Physician Assistant

## 2017-04-14 ENCOUNTER — Encounter: Payer: Self-pay | Admitting: Physician Assistant

## 2017-04-14 VITALS — BP 122/90 | HR 83 | Ht 65.0 in | Wt 171.0 lb

## 2017-04-14 DIAGNOSIS — Q796 Ehlers-Danlos syndrome, unspecified: Secondary | ICD-10-CM

## 2017-04-14 DIAGNOSIS — R002 Palpitations: Secondary | ICD-10-CM | POA: Diagnosis not present

## 2017-04-14 DIAGNOSIS — I10 Essential (primary) hypertension: Secondary | ICD-10-CM

## 2017-04-14 DIAGNOSIS — G9600 Cerebrospinal fluid leak, unspecified: Secondary | ICD-10-CM

## 2017-04-14 MED ORDER — METOPROLOL SUCCINATE ER 50 MG PO TB24: 50.0000 mg | ORAL_TABLET | Freq: Every day | ORAL | 3 refills | Status: DC

## 2017-04-14 NOTE — Patient Instructions (Addendum)
Medication Instructions: INCREASE the Metoprolol Succinate to 50 mg daily in the evening STOP the Diltiazem  If you need a refill on your cardiac medications before your next appointment, please call your pharmacy.    Follow-Up: Your physician wants you to follow-up in: 6 months with Dr. Sallyanne Kuster You will receive a reminder letter in the mail two months in advance. If you don't receive a letter, please call our office at [phone] to schedule this follow-up appointment.   Thank you for choosing [facility]care at [facility]!!

## 2017-04-14 NOTE — Progress Notes (Signed)
Cardiology Office Note    Date:  04/16/2017   ID:  Lorraine Weber, DOB 07-24-1980, MRN [id number]  PCP:  Redmond School, MD  Cardiologist:  Dr. Sallyanne Kuster   Chief Complaint  Patient presents with  . Edema    3-4 WEEKS. Seen for Dr. Sallyanne Kuster    History of Present Illness:  Lorraine Weber is a 37 y.o. female with PMH of Ehlers-Danlos syndrome with vascular manifestation and history of joint dislocation in the skin hyper extensibility.  Her brother had aortic dissection around age 28 and underwent emergency surgery. The brother reportedly underwent genetic testing and his 2 daughters also has been confirmed to have the disorder.  She was last seen by Dr. Sallyanne Kuster on 02/16/2016 for evaluation of possible vascular complication of Ehlers-Danlos syndrome.  At that time she denies any chest pain.  She is relatively sedentary due to a neurological problem.  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  It was the difficulty in stopping the leak was one of the features that led to the suspicion that she had earlier Danlos syndrome.  CTA of chest and aorta obtained on 02/21/2016 showed no evidence of aortic aneurysm or dissection.  Due to occipital headache and double vision related to intracranial hypertension, she had CT-guided epidural patches in December 2018.  During the previous office visit, there was some concern of uncontrolled blood pressure.  I decreased her diltiazem and added metoprolol succinate with plan of completely transitioning her to the metoprolol.  She presents today for follow-up.  She only has trace amount of edema in bilateral lower extremity.  She denies any significant shortness of breath.  She has a focal pain in the upper back.  This has not changed in characteristic recently.  She denies any severe ripping pain.  She says she had history of spinal disease requiring fusion in the past  and I felt this is likely related to her chronic issue.  Suspicion for dissection relatively low, however she understands if her pain get worse or if her back pain moves to a new location, she will need to let us know and potentially obtain chest/abd CTA to rule out dissection.  Otherwise I would discontinue her diltiazem today and to increase metoprolol succinate to 50 mg daily.  I have advised her to monitor her blood pressure to make sure she is status with in blood pressure goal of 154-008 systolic.      Past Medical History:  Diagnosis Date  . Anemia   . Concussion    x8, last 2010, residual homonymous Hemianopsia  . CSF leak    multiple serum pathes  . Frequent sinus infections    tx with flonase nasal spray  . GERD (gastroesophageal reflux disease)   . History of blood transfusion    over 5 yrs ago  . Hypertension   . Mastoiditis   . Migraines   . Migraines   . Nephrolithiasis    X5, Dr Jeffie Pollock, passed stones, no surgery  . Peptic ulcer disease   . Von Willebrand disease ([place])    mild form per patient    Past Surgical History:  Procedure Laterality Date  . APPENDECTOMY    . BACK SURGERY     T5-6  . DILITATION & CURRETTAGE/HYSTROSCOPY WITH NOVASURE ABLATION N/A 08/23/2015   Procedure: DILATATION & CURETTAGE/HYSTEROSCOPY WITH NOVASURE ABLATION;  Surgeon: Vanessa Kick, MD;  Location: [facility]  ORS;  Service: Gynecology;  Laterality: N/A;  . HAND TENDON SURGERY Left    left, complicated by MRSA  . spinal fluid leak patching     > 10X;[facility] - blood patching  . WISDOM TOOTH EXTRACTION      Current Medications: Outpatient Medications Prior to Visit  Medication Sig Dispense Refill  . acetaminophen (TYLENOL) 650 MG CR tablet Take 650 mg by mouth at bedtime as needed for pain.     . butalbital-acetaminophen-caffeine (FIORICET WITH CODEINE) 50-325-40-30 MG capsule Take 1 capsule by mouth every 6 (six) hours as needed for headache.    . fluticasone (FLONASE) 50 MCG/ACT nasal spray Place  1 spray into both nostrils daily.    . metoCLOPramide (REGLAN) 10 MG tablet Take 10 mg by mouth 4 (four) times daily.    Marland Kitchen omeprazole (PRILOSEC) 20 MG capsule Take 20 mg by mouth daily.    . ondansetron (ZOFRAN) 4 MG tablet TAKE 1 TABLET EVERY 8 HOURS AS NEEDED FOR NAUSEA  0  . diltiazem (CARDIZEM CD) 120 MG 24 hr capsule Take 1 capsule (120 mg total) by mouth daily. 30 capsule 0  . metoprolol succinate (TOPROL XL) 25 MG 24 hr tablet Take 1 tablet (25 mg total) by mouth daily. 30 tablet 0   No facility-administered medications prior to visit.      Allergies:   Aspirin; Other; Topamax [topiramate]; Levofloxacin; Levofloxacin in d5w; and Sulfamethoxazole-trimethoprim   Social History   Socioeconomic History  . Marital status: Married    Spouse name: None  . Number of children: None  . Years of education: None  . Highest education level: None  Social Needs  . Financial resource strain: None  . Food insecurity - worry: None  . Food insecurity - inability: None  . Transportation needs - medical: None  . Transportation needs - non-medical: None  Occupational History  . Occupation: [employer]    Employer: [employer]  Tobacco Use  . Smoking status: Former Smoker    Packs/day: 0.30    Years: 10.00    Pack years: 3.00    Types: Cigarettes  . Smokeless tobacco: Never Used  . Tobacco comment: smoked age 68-present, up to 1 ppd.05/24/14 1/2 ppd  Substance and Sexual Activity  . Alcohol use: No    Alcohol/week: 0.0 oz  . Drug use: No  . Sexual activity: Yes    Partners: Male    Birth control/protection: None    Comment: IUD removed 06/2015  Other Topics Concern  . None  Social History Narrative  . None     Family History:  The patient's family history includes Arthritis in her father; Diabetes in her father, maternal grandmother, and paternal grandfather; Heart disease in her brother, maternal aunt, maternal grandfather, maternal grandmother, maternal uncle, and  mother; Hyperlipidemia in her father and paternal grandfather; Other in her unknown relative; Prostate cancer in her paternal grandfather; Stroke in her father; Stroke (age of onset: 43) in her maternal aunt.   ROS:   Please see the history of present illness.    ROS All other systems reviewed and are negative.   PHYSICAL EXAM:   VS:  BP 122/90   Pulse 83   Ht 5\' 5"  (1.651 m)   Wt 171 lb (77.6 kg)   BMI 28.46 kg/m    GEN: Well nourished, well developed, in no acute distress  HEENT: normal  Neck: no JVD, carotid bruits, or masses Cardiac: RRR; no murmurs, rubs, or gallops,no edema  Respiratory:  clear to auscultation bilaterally, normal work of breathing GI: soft, nontender, nondistended, + BS MS: no deformity or atrophy  Skin: warm and dry, no rash Neuro:  Alert and Oriented x 3, Strength and sensation are intact Psych: euthymic mood, full affect  Wt Readings from Last 3 Encounters:  04/14/17 171 lb (77.6 kg)  03/17/17 170 lb (77.1 kg)  02/16/16 115 lb (52.2 kg)      Studies/Labs Reviewed:   EKG:  EKG is not ordered today.  Recent Labs: No results found for requested labs within last 8760 hours.   Lipid Panel    Component Value Date/Time   CHOL 128 06/28/2016 1531   TRIG 98 06/28/2016 1531   HDL 48 06/28/2016 1531   LDLCALC 60 06/28/2016 1531    Additional studies/ records that were reviewed today include:   Echo 10/05/2014 LV EF: 60% -   65% Study Conclusions  - Left ventricle: The cavity size was normal. Systolic function was   normal. The estimated ejection fraction was in the range of 60%   to 65%. Wall motion was normal; there were no regional wall   motion abnormalities. There was no evidence of elevated   ventricular filling pressure by Doppler parameters. - Aortic valve: Trileaflet; mildly thickened leaflets. There was   mild regurgitation. - Mitral valve: There was mild regurgitation. - Pulmonary arteries: Systolic pressure was mildly increased.  PA   peak pressure: 36 mm Hg (S).    CTA of chest 02/21/2016 IMPRESSION: 1. No evidence for aortic aneurysm or dissection 2. Negative CTA of the chest     ASSESSMENT:    1. Essential hypertension   2. CSF leak   3. Ehlers-Danlos syndrome   4. Palpitation      PLAN:  In order of problems listed above:  1. Hypertension: Blood pressure very well controlled, will discontinue her diltiazem and switch to Toprol-XL to 50 mg daily.  She will continue to monitor her blood pressure to make sure her systolic blood pressure is within goal of 110-120s  2. Palpitation: Denies any recent palpitation, heart rate very well controlled on Toprol-XL  3. Ehlers-Danlos syndrome: Her brother was tested positive.  Last CTA in 2018 was negative for dissection.  She does not have a very focal back discomfort, this was felt to be related to previous spinal fusion.  She understands she will need to let us know if she has worsening back pain or pain that travels.  4. CSF leak: Followed by [facility], underwent multiple injections blood patch.  Low CSF pressure.    Medication Adjustments/Labs and Tests Ordered: Current medicines are reviewed at length with the patient today.  Concerns regarding medicines are outlined above.  Medication changes, Labs and Tests ordered today are listed in the Patient Instructions below. Patient Instructions  Medication Instructions: INCREASE the Metoprolol Succinate to 50 mg daily in the evening STOP the Diltiazem  If you need a refill on your cardiac medications before your next appointment, please call your pharmacy.    Follow-Up: Your physician wants you to follow-up in: 6 months with Dr. Sallyanne Kuster You will receive a reminder letter in the mail two months in advance. If you don't receive a letter, please call our office at [phone] to schedule this follow-up appointment.   Thank you for choosing [facility], Almyra Def[place], [place]    04/16/2017 12:58 PM    [address], [place], [place]  [address] Phone: [phone]; Fax: [phone]

## 2017-04-16 ENCOUNTER — Encounter: Payer: Self-pay | Admitting: Physician Assistant

## 2017-04-18 NOTE — Progress Notes (Signed)
Thanks MCr 

## 2017-04-25 DIAGNOSIS — J0140 Acute pansinusitis, unspecified: Secondary | ICD-10-CM | POA: Diagnosis not present

## 2017-05-05 DIAGNOSIS — R5383 Other fatigue: Secondary | ICD-10-CM | POA: Diagnosis not present

## 2017-05-05 DIAGNOSIS — R7989 Other specified abnormal findings of blood chemistry: Secondary | ICD-10-CM | POA: Diagnosis not present

## 2017-05-12 DIAGNOSIS — R42 Dizziness and giddiness: Secondary | ICD-10-CM | POA: Diagnosis not present

## 2017-05-12 DIAGNOSIS — H9319 Tinnitus, unspecified ear: Secondary | ICD-10-CM | POA: Diagnosis not present

## 2017-05-12 DIAGNOSIS — G960 Cerebrospinal fluid leak: Secondary | ICD-10-CM | POA: Diagnosis not present

## 2017-05-12 DIAGNOSIS — H532 Diplopia: Secondary | ICD-10-CM | POA: Diagnosis not present

## 2017-05-12 DIAGNOSIS — R51 Headache: Secondary | ICD-10-CM | POA: Diagnosis not present

## 2017-05-12 DIAGNOSIS — R112 Nausea with vomiting, unspecified: Secondary | ICD-10-CM | POA: Diagnosis not present

## 2017-05-13 DIAGNOSIS — Z8669 Personal history of other diseases of the nervous system and sense organs: Secondary | ICD-10-CM | POA: Diagnosis not present

## 2017-05-13 DIAGNOSIS — G960 Cerebrospinal fluid leak: Secondary | ICD-10-CM | POA: Diagnosis not present

## 2017-05-13 DIAGNOSIS — H532 Diplopia: Secondary | ICD-10-CM | POA: Diagnosis not present

## 2017-05-13 DIAGNOSIS — R51 Headache: Secondary | ICD-10-CM | POA: Diagnosis not present

## 2017-05-13 DIAGNOSIS — Z79899 Other long term (current) drug therapy: Secondary | ICD-10-CM | POA: Diagnosis not present

## 2017-05-13 DIAGNOSIS — H9319 Tinnitus, unspecified ear: Secondary | ICD-10-CM | POA: Diagnosis not present

## 2017-05-26 ENCOUNTER — Ambulatory Visit: Payer: 59 | Admitting: Cardiovascular Disease

## 2017-06-22 DIAGNOSIS — M545 Low back pain: Secondary | ICD-10-CM | POA: Diagnosis not present

## 2017-06-22 DIAGNOSIS — M25562 Pain in left knee: Secondary | ICD-10-CM | POA: Diagnosis not present

## 2017-06-25 DIAGNOSIS — M5412 Radiculopathy, cervical region: Secondary | ICD-10-CM | POA: Diagnosis not present

## 2017-06-25 DIAGNOSIS — M48062 Spinal stenosis, lumbar region with neurogenic claudication: Secondary | ICD-10-CM | POA: Diagnosis not present

## 2017-07-15 DIAGNOSIS — M4802 Spinal stenosis, cervical region: Secondary | ICD-10-CM | POA: Diagnosis not present

## 2017-07-15 DIAGNOSIS — M5412 Radiculopathy, cervical region: Secondary | ICD-10-CM | POA: Diagnosis not present

## 2017-07-15 DIAGNOSIS — M50122 Cervical disc disorder at C5-C6 level with radiculopathy: Secondary | ICD-10-CM | POA: Diagnosis not present

## 2017-07-21 DIAGNOSIS — Z1389 Encounter for screening for other disorder: Secondary | ICD-10-CM | POA: Diagnosis not present

## 2017-07-21 DIAGNOSIS — M5090 Cervical disc disorder, unspecified, unspecified cervical region: Secondary | ICD-10-CM | POA: Diagnosis not present

## 2017-07-21 DIAGNOSIS — I10 Essential (primary) hypertension: Secondary | ICD-10-CM | POA: Diagnosis not present

## 2017-07-21 DIAGNOSIS — Z0001 Encounter for general adult medical examination with abnormal findings: Secondary | ICD-10-CM | POA: Diagnosis not present

## 2017-07-24 DIAGNOSIS — R110 Nausea: Secondary | ICD-10-CM | POA: Diagnosis not present

## 2017-07-24 DIAGNOSIS — T63001A Toxic effect of unspecified snake venom, accidental (unintentional), initial encounter: Secondary | ICD-10-CM | POA: Diagnosis not present

## 2017-07-24 DIAGNOSIS — S91331A Puncture wound without foreign body, right foot, initial encounter: Secondary | ICD-10-CM | POA: Diagnosis not present

## 2017-08-05 DIAGNOSIS — Z01419 Encounter for gynecological examination (general) (routine) without abnormal findings: Secondary | ICD-10-CM | POA: Diagnosis not present

## 2017-08-05 DIAGNOSIS — Z124 Encounter for screening for malignant neoplasm of cervix: Secondary | ICD-10-CM | POA: Diagnosis not present

## 2017-08-05 DIAGNOSIS — R300 Dysuria: Secondary | ICD-10-CM | POA: Diagnosis not present

## 2017-09-02 DIAGNOSIS — R110 Nausea: Secondary | ICD-10-CM | POA: Diagnosis not present

## 2017-09-02 DIAGNOSIS — R51 Headache: Secondary | ICD-10-CM | POA: Diagnosis not present

## 2017-09-02 DIAGNOSIS — H9312 Tinnitus, left ear: Secondary | ICD-10-CM | POA: Diagnosis not present

## 2017-09-02 DIAGNOSIS — G960 Cerebrospinal fluid leak: Secondary | ICD-10-CM | POA: Diagnosis not present

## 2017-09-02 DIAGNOSIS — H532 Diplopia: Secondary | ICD-10-CM | POA: Diagnosis not present

## 2017-09-02 DIAGNOSIS — R42 Dizziness and giddiness: Secondary | ICD-10-CM | POA: Diagnosis not present

## 2017-09-11 DIAGNOSIS — G960 Cerebrospinal fluid leak: Secondary | ICD-10-CM | POA: Diagnosis not present

## 2017-09-12 DIAGNOSIS — N390 Urinary tract infection, site not specified: Secondary | ICD-10-CM | POA: Diagnosis not present

## 2017-09-12 DIAGNOSIS — R300 Dysuria: Secondary | ICD-10-CM | POA: Diagnosis not present

## 2017-09-16 ENCOUNTER — Encounter: Payer: Self-pay | Admitting: Emergency Medicine

## 2017-09-16 ENCOUNTER — Other Ambulatory Visit: Payer: Self-pay

## 2017-09-16 ENCOUNTER — Emergency Department: Payer: Medicare Other

## 2017-09-16 ENCOUNTER — Emergency Department
Admission: EM | Admit: 2017-09-16 | Discharge: 2017-09-16 | Disposition: A | Discharge status: Home / Self Care | Payer: Medicare Other | Attending: Student in an Organized Health Care Education/Training Program | Admitting: Student in an Organized Health Care Education/Training Program

## 2017-09-16 DIAGNOSIS — Z87891 Personal history of nicotine dependence: Secondary | ICD-10-CM | POA: Diagnosis not present

## 2017-09-16 DIAGNOSIS — Z79899 Other long term (current) drug therapy: Secondary | ICD-10-CM | POA: Insufficient documentation

## 2017-09-16 DIAGNOSIS — G8929 Other chronic pain: Secondary | ICD-10-CM

## 2017-09-16 DIAGNOSIS — R519 Headache, unspecified: Secondary | ICD-10-CM

## 2017-09-16 DIAGNOSIS — R51 Headache: Secondary | ICD-10-CM | POA: Insufficient documentation

## 2017-09-16 DIAGNOSIS — G43719 Chronic migraine without aura, intractable, without status migrainosus: Secondary | ICD-10-CM | POA: Diagnosis not present

## 2017-09-16 DIAGNOSIS — I10 Essential (primary) hypertension: Secondary | ICD-10-CM | POA: Diagnosis not present

## 2017-09-16 LAB — BASIC METABOLIC PANEL
Anion gap: 8 (ref 5–15)
BUN: 18 mg/dL (ref 6–20)
CO2: 26 mmol/L (ref 22–32)
Calcium: 8.9 mg/dL (ref 8.9–10.3)
Chloride: 105 mmol/L (ref 98–111)
Creatinine, Ser: 0.95 mg/dL (ref 0.44–1.00)
GFR calc Af Amer: >60 mL/min (ref >60)
GFR calc non Af Amer: >60 mL/min (ref >60)
Glucose, Bld: 90 mg/dL (ref 70–99)
Potassium: 3.4 mmol/L — ABNORMAL LOW (ref 3.5–5.1)
Sodium: 139 mmol/L (ref 135–145)

## 2017-09-16 LAB — CBC WITH DIFFERENTIAL/PLATELET
Basophils Absolute: 0 10*3/uL (ref 0–0.1)
Basophils Relative: 0 %
Eosinophils Absolute: 0 10*3/uL (ref 0–0.7)
Eosinophils Relative: 1 %
HCT: 43.5 % (ref 35.0–47.0)
Hemoglobin: 15.1 g/dL (ref 12.0–16.0)
Lymphocytes Relative: 29 %
Lymphs Abs: 2.1 10*3/uL (ref 1.0–3.6)
MCH: 31.9 pg (ref 26.0–34.0)
MCHC: 34.7 g/dL (ref 32.0–36.0)
MCV: 92 fL (ref 80.0–100.0)
Monocytes Absolute: 0.6 10*3/uL (ref 0.2–0.9)
Monocytes Relative: 8 %
Neutro Abs: 4.4 10*3/uL (ref 1.4–6.5)
Neutrophils Relative %: 62 %
Platelets: 258 10*3/uL (ref 150–440)
RBC: 4.72 MIL/uL (ref 3.80–5.20)
RDW: 14.3 % (ref 11.5–14.5)
WBC: 7.2 10*3/uL (ref 3.6–11.0)

## 2017-09-16 LAB — FIBRIN DERIVATIVES D-DIMER (ARMC ONLY): Fibrin derivatives D-dimer (ARMC): 243.22 ng/mL (FEU) (ref 0.00–499.00)

## 2017-09-16 MED ORDER — DIPHENHYDRAMINE HCL 50 MG/ML IJ SOLN
12.5000 mg | Freq: Once | INTRAMUSCULAR | Status: AC
  Administered 2017-09-16: 12.5 mg via INTRAVENOUS
  Filled 2017-09-16: qty 1

## 2017-09-16 MED ORDER — GADOBENATE DIMEGLUMINE 529 MG/ML IV SOLN
15.0000 mL | Freq: Once | INTRAVENOUS | Status: AC | PRN
  Administered 2017-09-16: 15 mL via INTRAVENOUS

## 2017-09-16 MED ORDER — PROCHLORPERAZINE EDISYLATE 10 MG/2ML IJ SOLN
10.0000 mg | Freq: Once | INTRAMUSCULAR | Status: AC
  Administered 2017-09-16: 10 mg via INTRAVENOUS
  Filled 2017-09-16: qty 2

## 2017-09-16 NOTE — ED Triage Notes (Signed)
Pt presents to ED via POV with c/o headache. Pt states on 7/23 had blood patch done at [facility] to treat CSF leaks that she has a hx of, on 8/1 had an LP to test CSF pressure. Pt states on 7/26 began having a HA that "is different" from her baseline HA from low CSF. Pt also c/o light sensitivity, N/V. Pt states initially called neuroradiologist and that was why LP was performed, states spoke with her today and her neuroradiologist at [facility] wanted MRI and lab work. Pt is alert and oriented, facial symmetry intact, L grip weakness noted (pt states is baseline).

## 2017-09-16 NOTE — ED Provider Notes (Signed)
[facility] Emergency Department Provider Note    First MD Initiated Contact with Patient 09/16/17 Bosie Helper     (approximate)  I have reviewed the triage vital signs and the nursing notes.   HISTORY  Chief Complaint Headache    HPI Lorraine Weber is a 37 y.o. female with extensive history of recurrent chronic migraines as well as history of CSF leak requiring multiple blood patches presents to the ER direction of their neuro radiologist for persistent headache status post CSF leak.  States that this is consistent with previous headaches but lasting longer.  Did recently have blood patch but did not get any better.  Denies any fevers.  No new numbness or tingling.  States she has chronic weakness from the left side.  Denies any new blurry vision.  Does have photophobia.   Past Medical History:  Diagnosis Date  . Anemia   . Concussion    x8, last 2010, residual homonymous Hemianopsia  . CSF leak    multiple serum pathes  . Frequent sinus infections    tx with flonase nasal spray  . GERD (gastroesophageal reflux disease)   . History of blood transfusion    over 5 yrs ago  . Hypertension   . Mastoiditis   . Migraines   . Migraines   . Nephrolithiasis    X5, Dr Jeffie Pollock, passed stones, no surgery  . Peptic ulcer disease   . Von Willebrand disease ([place])    mild form per patient   Family History  Problem Relation Age of Onset  . Arthritis Father   . Hyperlipidemia Father   . Diabetes Father   . Stroke Father        PTE post CVA  . Prostate cancer Paternal Grandfather   . Hyperlipidemia Paternal Grandfather   . Diabetes Paternal Grandfather   . Heart disease Mother        ?hypertrophic idiopathic subaortic stenosis  . Heart disease Maternal Grandmother   . Diabetes Maternal Grandmother   . Heart disease Maternal Grandfather        HISS  . Heart disease Maternal Aunt        HISS  . Stroke Maternal Aunt 50  . Heart disease Maternal Uncle   .  Heart disease Brother        HISS  . Other Unknown        EDS in M, bro & niece   Past Surgical History:  Procedure Laterality Date  . APPENDECTOMY    . BACK SURGERY     T5-6  . DILITATION & CURRETTAGE/HYSTROSCOPY WITH NOVASURE ABLATION N/A 08/23/2015   Procedure: DILATATION & CURETTAGE/HYSTEROSCOPY WITH NOVASURE ABLATION;  Surgeon: Vanessa Kick, MD;  Location: [facility];  Service: Gynecology;  Laterality: N/A;  . HAND TENDON SURGERY Left    left, complicated by MRSA  . spinal fluid leak patching     > 10X;[facility] - blood patching  . WISDOM TOOTH EXTRACTION     Patient Active Problem List   Diagnosis Date Noted  . Encounter for therapeutic drug monitoring 09/28/2014  . CSF leak 05/10/2014  . Back ache 08/12/2013  . Cephalalgia 08/12/2013  . Cervical pain 08/12/2013  . Complicated migraine 95/10/3265  . Hemiplegia, unspecified, affecting nondominant side 01/02/2013  . Sebaceous cyst of right axilla 07/02/2012  . Atypical face pain 04/30/2012  . Dysfunction of eustachian tube 04/30/2012  . Arthralgia of temporomandibular joint 04/30/2012  . Chronic sinusitis 10/01/2011  . Candida vaginitis 08/22/2010  .  Preseptal cellulitis 08/14/2010  . MRSA bacteremia 08/03/2010  . GERD 04/02/2010  . ANEMIA-NOS 11/08/2009  . Von Willebrand's disease ([place]) 11/08/2009  . PEPTIC ULCER DISEASE 11/08/2009  . Headache, post-traumatic 11/08/2009  . NEPHROLITHIASIS, HX OF 11/08/2009      Prior to Admission medications   Medication Sig Start Date End Date Taking? Authorizing Provider  acetaminophen (TYLENOL) 650 MG CR tablet Take 650 mg by mouth at bedtime as needed for pain.     [provider]  butalbital-acetaminophen-caffeine (FIORICET WITH CODEINE) 50-325-40-30 MG capsule Take 1 capsule by mouth every 6 (six) hours as needed for headache.    [provider]  fluticasone (FLONASE) 50 MCG/ACT nasal spray Place 1 spray into both nostrils daily.    [provider]    metoCLOPramide (REGLAN) 10 MG tablet Take 10 mg by mouth 4 (four) times daily.    [provider]  metoprolol succinate (TOPROL-XL) 50 MG 24 hr tablet Take 1 tablet (50 mg total) by mouth daily. 04/14/17   Almyra Deforest, PA  omeprazole (PRILOSEC) 20 MG capsule Take 20 mg by mouth daily.    [provider]  ondansetron (ZOFRAN) 4 MG tablet TAKE 1 TABLET EVERY 8 HOURS AS NEEDED FOR NAUSEA 01/08/17   [provider]  levonorgestrel-ethinyl estradiol (ALTAVERA) 0.15-30 MG-MCG per tablet Take 1 tablet by mouth daily.    04/29/11  [provider]    Allergies Aspirin; Other; Topamax [topiramate]; Levofloxacin; Levofloxacin in d5w; and Sulfamethoxazole-trimethoprim    Social History Social History   Tobacco Use  . Smoking status: Former Smoker    Packs/day: 0.30    Years: 10.00    Pack years: 3.00    Types: Cigarettes  . Smokeless tobacco: Never Used  . Tobacco comment: smoked age 33-present, up to 1 ppd.05/24/14 1/2 ppd  Substance Use Topics  . Alcohol use: No    Alcohol/week: 0.0 oz  . Drug use: No    Review of Systems Patient denies headaches, rhinorrhea, blurry vision, numbness, shortness of breath, chest pain, edema, cough, abdominal pain, nausea, vomiting, diarrhea, dysuria, fevers, rashes or hallucinations unless otherwise stated above in HPI. ____________________________________________   PHYSICAL EXAM:  VITAL SIGNS: Vitals:   09/16/17 2114 09/16/17 2305  BP: 132/75 129/68  Pulse: 61 62  Resp: 18 18  Temp:    SpO2: 98% 99%    Constitutional: Alert and oriented.  Eyes: Conjunctivae are normal.  Head: Atraumatic. Nose: No congestion/rhinnorhea. Mouth/Throat: Mucous membranes are moist.   Neck: No stridor. Painless ROM.  Cardiovascular: Normal rate, regular rhythm. Grossly normal heart sounds.  Good peripheral circulation. Respiratory: Normal respiratory effort.  No retractions. Lungs CTAB. Gastrointestinal: Soft and nontender. No  distention. No abdominal bruits. No CVA tenderness. Genitourinary:  Musculoskeletal: No lower extremity tenderness nor edema.  No joint effusions. Neurologic:  Normal speech and language. No gross focal neurologic deficits are appreciated. No facial droop Skin:  Skin is warm, dry and intact. No rash noted. Psychiatric: Mood and affect are normal. Speech and behavior are normal.  ____________________________________________   LABS (all labs ordered are listed, but only abnormal results are displayed)  Results for orders placed or performed during the hospital encounter of 09/16/17 (from the past 24 hour(s))  CBC with Differential     Status: None   Collection Time: 09/16/17  5:09 PM  Result Value Ref Range   WBC 7.2 3.6 - 11.0 K/uL   RBC 4.72 3.80 - 5.20 MIL/uL   Hemoglobin 15.1 12.0 - 16.0  g/dL   HCT 43.5 35.0 - 47.0 %   MCV 92.0 80.0 - 100.0 fL   MCH 31.9 26.0 - 34.0 pg   MCHC 34.7 32.0 - 36.0 g/dL   RDW 14.3 11.5 - 14.5 %   Platelets 258 150 - 440 K/uL   Neutrophils Relative % 62 %   Neutro Abs 4.4 1.4 - 6.5 K/uL   Lymphocytes Relative 29 %   Lymphs Abs 2.1 1.0 - 3.6 K/uL   Monocytes Relative 8 %   Monocytes Absolute 0.6 0.2 - 0.9 K/uL   Eosinophils Relative 1 %   Eosinophils Absolute 0.0 0 - 0.7 K/uL   Basophils Relative 0 %   Basophils Absolute 0.0 0 - 0.1 K/uL  Basic metabolic panel     Status: Abnormal   Collection Time: 09/16/17  5:09 PM  Result Value Ref Range   Sodium 139 135 - 145 mmol/L   Potassium 3.4 (L) 3.5 - 5.1 mmol/L   Chloride 105 98 - 111 mmol/L   CO2 26 22 - 32 mmol/L   Glucose, Bld 90 70 - 99 mg/dL   BUN 18 6 - 20 mg/dL   Creatinine, Ser 0.95 0.44 - 1.00 mg/dL   Calcium 8.9 8.9 - 10.3 mg/dL   GFR calc non Af Amer >60 >60 mL/min   GFR calc Af Amer >60 >60 mL/min   Anion gap 8 5 - 15  Fibrin derivatives D-Dimer ([facility] only)     Status: None   Collection Time: 09/16/17  9:14 PM  Result Value Ref Range   Fibrin derivatives D-dimer ([facility]) 243.22 0.00  - 499.00 ng/mL (FEU)   ____________________________________________ ____________________________________________  RADIOLOGY  I personally reviewed all radiographic images ordered to evaluate for the above acute complaints and reviewed radiology reports and findings.  These findings were personally discussed with the patient.  Please see medical record for radiology report.  ____________________________________________   PROCEDURES  Procedure(s) performed:  Procedures    Critical Care performed: no ____________________________________________   INITIAL IMPRESSION / ASSESSMENT AND PLAN / ED COURSE  Pertinent labs & imaging results that were available during my care of the patient were reviewed by me and considered in my medical decision making (see chart for details).   DDX: Migraine, tension, chronic, status migrainosus, MS, mass, CVT  Shontia D Farewell is a 37 y.o. who presents to the ED with symptoms as described above.  Patient nontoxic-appearing.  Discussed options for further diagnostics including blood work, MRI, CT.  Based on her presentation complexity of her history with reassuring blood work will order MRI to exclude CVT the remainder of the above differential.  Will provide IV medication for headache.  Clinical Course as of Sep 16 2349  Tue Sep 16, 2017  2142 Discussed MRI results with Dr. Olin Pia neuroradiology.   [PR]    Clinical Course User Index [PR] Merlyn Lot, MD   His pain improved.  D-dimer is negative.  This is not clinically consistent with CVT.  Stable and appropriate for outpatient follow-up.  As part of my medical decision making, I reviewed the following data within the [place] notes reviewed and incorporated, Labs reviewed, notes from prior ED visits and [place] Controlled Substance Database   ____________________________________________   FINAL CLINICAL IMPRESSION(S) / ED DIAGNOSES  Final diagnoses:  Chronic  intractable headache, unspecified headache type      NEW MEDICATIONS STARTED DURING THIS VISIT:  Discharge Medication List as of 09/16/2017 10:26 PM  Note:  This document was prepared using Dragon voice recognition software and may include unintentional dictation errors.    Merlyn Lot, MD 09/16/17 2352

## 2017-09-16 NOTE — ED Notes (Signed)
Pt went to MRI.

## 2017-09-16 NOTE — ED Notes (Signed)
Pain 8/10 to head prior to med administration, pt awaiting MRI.

## 2017-09-16 NOTE — Discharge Instructions (Addendum)

## 2017-09-16 NOTE — ED Notes (Signed)
Pt returned from MRI °

## 2017-09-24 DIAGNOSIS — H5213 Myopia, bilateral: Secondary | ICD-10-CM | POA: Diagnosis not present

## 2017-09-24 DIAGNOSIS — G932 Benign intracranial hypertension: Secondary | ICD-10-CM | POA: Diagnosis not present

## 2017-09-24 DIAGNOSIS — H5713 Ocular pain, bilateral: Secondary | ICD-10-CM | POA: Diagnosis not present

## 2017-09-30 DIAGNOSIS — R319 Hematuria, unspecified: Secondary | ICD-10-CM | POA: Diagnosis not present

## 2017-09-30 DIAGNOSIS — K219 Gastro-esophageal reflux disease without esophagitis: Secondary | ICD-10-CM | POA: Diagnosis not present

## 2017-09-30 DIAGNOSIS — G43909 Migraine, unspecified, not intractable, without status migrainosus: Secondary | ICD-10-CM | POA: Diagnosis not present

## 2017-09-30 DIAGNOSIS — G971 Other reaction to spinal and lumbar puncture: Secondary | ICD-10-CM | POA: Diagnosis not present

## 2017-09-30 DIAGNOSIS — T6591XA Toxic effect of unspecified substance, accidental (unintentional), initial encounter: Secondary | ICD-10-CM | POA: Diagnosis not present

## 2017-09-30 DIAGNOSIS — N3001 Acute cystitis with hematuria: Secondary | ICD-10-CM | POA: Diagnosis not present

## 2017-09-30 DIAGNOSIS — I10 Essential (primary) hypertension: Secondary | ICD-10-CM | POA: Diagnosis not present

## 2017-10-27 DIAGNOSIS — R319 Hematuria, unspecified: Secondary | ICD-10-CM | POA: Diagnosis not present

## 2017-10-27 DIAGNOSIS — R52 Pain, unspecified: Secondary | ICD-10-CM | POA: Diagnosis not present

## 2017-10-28 ENCOUNTER — Encounter: Payer: Self-pay | Admitting: Internal Medicine

## 2017-11-04 DIAGNOSIS — G960 Cerebrospinal fluid leak: Secondary | ICD-10-CM | POA: Diagnosis not present

## 2017-11-04 DIAGNOSIS — R51 Headache: Secondary | ICD-10-CM | POA: Diagnosis not present

## 2017-11-27 DIAGNOSIS — J0100 Acute maxillary sinusitis, unspecified: Secondary | ICD-10-CM | POA: Diagnosis not present

## 2017-12-03 ENCOUNTER — Ambulatory Visit: Payer: Medicare Other | Admitting: Internal Medicine

## 2017-12-03 ENCOUNTER — Encounter: Payer: Self-pay | Admitting: Internal Medicine

## 2017-12-03 VITALS — BP 138/90 | HR 84 | Ht 64.0 in | Wt 167.0 lb

## 2017-12-03 DIAGNOSIS — K5900 Constipation, unspecified: Secondary | ICD-10-CM | POA: Diagnosis not present

## 2017-12-03 DIAGNOSIS — K625 Hemorrhage of anus and rectum: Secondary | ICD-10-CM | POA: Diagnosis not present

## 2017-12-03 DIAGNOSIS — R109 Unspecified abdominal pain: Secondary | ICD-10-CM | POA: Diagnosis not present

## 2017-12-03 MED ORDER — SUPREP BOWEL PREP KIT 17.5-3.13-1.6 GM/177ML PO SOLN: 1.0000 | ORAL | 0 refills | Status: DC

## 2017-12-03 NOTE — Patient Instructions (Signed)
You have been scheduled for a colonoscopy. Please follow written instructions given to you at your visit today.  Please pick up your prep supplies at the pharmacy within the next 1-3 days. If you use inhalers (even only as needed), please bring them with you on the day of your procedure. Your physician has requested that you go to www.startemmi.com and enter the access code given to you at your visit today. This web site gives a general overview about your procedure. However, you should still follow specific instructions given to you by our office regarding your preparation for the procedure.  If you are age 98 or older, your body mass index should be between 23-30. Your Body mass index is 28.67 kg/m. If this is out of the aforementioned range listed, please consider follow up with your Primary Care Provider.  If you are age 40 or younger, your body mass index should be between 19-25. Your Body mass index is 28.67 kg/m. If this is out of the aformentioned range listed, please consider follow up with your Primary Care Provider.

## 2017-12-03 NOTE — Progress Notes (Signed)
HISTORY OF PRESENT ILLNESS:  Lorraine Weber is a 37 y.o. female with a history of traumatic head injury, spinal fusion surgery, Ehlers Danlos syndrome followed at [facility], history of CSF leak, and questionable blood clotting disorder on disability who was sent today by her gynecologist Dr. Vanessa Weber regarding abdominal pain, constipation, and rectal bleeding.  Patient is accompanied by her husband.  She tells me that she has had issues with constipation for years but it has gotten progressively worse over the past 6 months.  Despite MiraLAX 1 dose daily and stool softeners 1 dose daily she has bowel movements once or twice per week.  When she is constipated she has abdominal discomfort, particularly on the right side.  In more recent months she has noticed worsening rectal bleeding.  No associated rectal pain.  No family history of colon cancer.  Prior smoker.  No weight loss.  No diarrhea.  Some nausea with vomiting.  History of reflux for which she takes omeprazole 20 mg daily.  No new medications.  Review of outside laboratories finds negative urinalysis from June 2016.  Review of outside x-rays includes abdominal ultrasound June 2016 to evaluate right upper quadrant pain.  She was found to have nonobstructing kidney stones.  Otherwise negative.  Gallbladder intact.  Additional laboratories from August 2019 include unremarkable basic metabolic panel.  Normal CBC with hemoglobin 15.1.  REVIEW OF SYSTEMS:  All non-GI ROS negative unless otherwise stated in the HPI except for sinus and allergy trouble, back pain, hematuria, fatigue, headaches, hearing problems, cramps, nosebleeds, sore throat, lower extremity swelling, excessive urination, urinary frequency  Past Medical History:  Diagnosis Date  . Anemia   . Concussion    x8, last 2010, residual homonymous Hemianopsia  . CSF leak    multiple serum pathes  . Frequent sinus infections    tx with flonase nasal spray  . GERD (gastroesophageal reflux  disease)   . History of blood transfusion    over 5 yrs ago  . Hypertension   . Mastoiditis   . Migraines   . Nephrolithiasis    X5, Dr [facility], passed stones, no surgery  . Peptic ulcer disease   . Von Willebrand disease ([facility])    mild form per patient    Past Surgical History:  Procedure Laterality Date  . APPENDECTOMY    . CERVICAL FUSION  2018   C5-C7  . DILITATION & CURRETTAGE/HYSTROSCOPY WITH NOVASURE ABLATION N/A 08/23/2015   Procedure: DILATATION & CURETTAGE/HYSTEROSCOPY WITH NOVASURE ABLATION;  Surgeon: Lorraine Kick, MD;  Location: [facility];  Service: Gynecology;  Laterality: N/A;  . HAND TENDON SURGERY Left    left, complicated by MRSA  . LAMINECTOMY  21016   T10-T12  . spinal fluid leak patching     > 10X;[facility] - blood patching  . SPINAL FUSION  2017   T10-T11  . SPINAL FUSION  2018   T10-L1  . WISDOM TOOTH EXTRACTION      Social History [facility]  reports that she has quit smoking. Her smoking use included cigarettes. She has a 3.00 pack-year smoking history. She has never used smokeless tobacco. She reports that she does not drink alcohol or use drugs.  family history includes Arthritis in her father; Diabetes in her father, maternal grandmother, and paternal grandfather; Heart disease in her brother, maternal aunt, maternal grandfather, maternal grandmother, maternal uncle, and mother; Hyperlipidemia in her father and paternal grandfather; Other in her unknown relative; Prostate cancer in her paternal grandfather; Stroke  in her father; Stroke (age of onset: 71) in her maternal aunt.  Allergies  Allergen Reactions  . Aspirin Other (See Comments)    REACTION: ANY BLOOD THINNERS/ DUE TO BLOOD DISORDER-VON WILDEBRAND'S   . Other Other (See Comments)    Pt Instructed by hematologist not to take blood thinners  ALL BLOOD THINNERS-CANNOT TAKE DUE TO [facility]   . Topamax [Topiramate] Other (See Comments)    HYPOTENSION AND SYNCOPE   . Levofloxacin Itching and  Rash  . Levofloxacin In D5w Swelling and Rash  . Sulfamethoxazole-Trimethoprim Rash    "Body Aches"       PHYSICAL EXAMINATION: Vital signs: BP 138/90 (BP Location: Left Arm, Patient Position: Sitting, Cuff Size: Normal)   Pulse 84   Ht 5\' 4"  (1.626 m) Comment: height measured without shoes  Wt 167 lb (75.8 kg)   BMI 28.67 kg/m   Constitutional: generally well-appearing, no acute distress Psychiatric: alert and oriented x3, cooperative Eyes: Slight ptosis of the left eye, extraocular movements intact, anicteric, conjunctiva pink Mouth: oral pharynx moist, no lesions Neck: supple no lymphadenopathy Cardiovascular: heart regular rate and rhythm, no murmur Lungs: clear to auscultation bilaterally Abdomen: soft, nontender, nondistended, no obvious ascites, no peritoneal signs, normal bowel sounds, no organomegaly Rectal: Deferred until colonoscopy Extremities: no clubbing, cyanosis, or pitting lower extremity edema bilaterally Skin: no lesions on visible extremities Neuro: No focal deficits.  Cranial nerves intact  ASSESSMENT:  1.  Chronic constipation.  Worsening 2.  New onset problems with rectal bleeding.  Worsening.  Chief concern would be colorectal neoplasia 3.  Abdominal pain.  Seemingly related to constipation.  Prior ultrasound to evaluate right-sided pain was negative except for nonobstructing kidney stones 4.  History of kidney stones 5.  Multiple other medical problems.  Stable   PLAN:  1.  Schedule colonoscopy to evaluate progressive constipation, bleeding, and pain.The nature of the procedure, as well as the risks, benefits, and alternatives were carefully and thoroughly reviewed with the patient. Ample time for discussion and questions allowed. The patient understood, was satisfied, and agreed to proceed. 2.  Increase MiraLAX daily to achieve desired result 3.  Additional recommendations post colonoscopy.  May be a good candidate for Linzess 4.  Ongoing general  medical care with PCP and multiple other specialists  A copy of this consultation note has been sent to Dr. Harrington Challenger

## 2017-12-04 ENCOUNTER — Encounter: Payer: Self-pay | Admitting: Internal Medicine

## 2017-12-12 ENCOUNTER — Ambulatory Visit (AMBULATORY_SURGERY_CENTER): Payer: Medicare Other | Admitting: Internal Medicine

## 2017-12-12 ENCOUNTER — Encounter: Payer: Self-pay | Admitting: Internal Medicine

## 2017-12-12 VITALS — BP 150/78 | HR 77 | Temp 98.0°F | Resp 16 | Ht 64.0 in | Wt 167.0 lb

## 2017-12-12 DIAGNOSIS — K625 Hemorrhage of anus and rectum: Secondary | ICD-10-CM | POA: Diagnosis not present

## 2017-12-12 DIAGNOSIS — R109 Unspecified abdominal pain: Secondary | ICD-10-CM

## 2017-12-12 DIAGNOSIS — K5900 Constipation, unspecified: Secondary | ICD-10-CM

## 2017-12-12 DIAGNOSIS — D123 Benign neoplasm of transverse colon: Secondary | ICD-10-CM | POA: Diagnosis not present

## 2017-12-12 MED ORDER — SODIUM CHLORIDE 0.9 % IV SOLN: 500.0000 mL | Freq: Once | INTRAVENOUS | Status: DC

## 2017-12-12 NOTE — Op Note (Signed)
[facility] Patient Name: [facility] Procedure Date: 12/12/2017 3:16 PM MRN: [id number] Endoscopist: Docia Chuck. Henrene Pastor , MD Age: 37 Referring MD:  Date of Birth: April 11, 1980 Gender: Female Account #: [id number] Procedure:                Colonoscopy cold snare polypectomy x 1 Indications:              Abdominal pain, Rectal bleeding, Constipation Medicines:                Monitored Anesthesia Care Procedure:                Pre-Anesthesia Assessment:                           - Prior to the procedure, a History and Physical                            was performed, and patient medications and                            allergies were reviewed. The patient's tolerance of                            previous anesthesia was also reviewed. The risks                            and benefits of the procedure and the sedation                            options and risks were discussed with the patient.                            All questions were answered, and informed consent                            was obtained. Prior Anticoagulants: The patient has                            taken no previous anticoagulant or antiplatelet                            agents. ASA Grade Assessment: III - A patient with                            severe systemic disease. After reviewing the risks                            and benefits, the patient was deemed in                            satisfactory condition to undergo the procedure.                           After obtaining informed consent, the colonoscope  was passed under direct vision. Throughout the                            procedure, the patient's blood pressure, pulse, and                            oxygen saturations were monitored continuously. The                            Colonoscope was introduced through the anus and                            advanced to the the cecum, identified by     appendiceal orifice and ileocecal valve. The                            terminal ileum, ileocecal valve, appendiceal                            orifice, and rectum were photographed. The quality                            of the bowel preparation was excellent. The                            colonoscopy was performed without difficulty. The                            patient tolerated the procedure well. The bowel                            preparation used was SUPREP. Scope In: 3:26:38 PM Scope Out: 3:41:12 PM Scope Withdrawal Time: 0 hours 9 minutes 34 seconds  Total Procedure Duration: 0 hours 14 minutes 34 seconds  Findings:                 The terminal ileum appeared normal.                           A 5 mm polyp was found in the transverse colon. The                            polyp was removed with a cold snare. Resection and                            retrieval were complete.                           The exam was otherwise without abnormality on                            direct and retroflexion views. Complications:            No immediate complications. Estimated blood loss:  None. Estimated Blood Loss:     Estimated blood loss: none. Impression:               - The examined portion of the ileum was normal.                           - One 5 mm polyp in the transverse colon, removed                            with a cold snare. Resected and retrieved.                           - The examination was otherwise normal on direct                            and retroflexion views. Recommendation:           - Repeat colonoscopy in 5 years for surveillance.                           - Patient has a contact number available for                            emergencies. The signs and symptoms of potential                            delayed complications were discussed with the                            patient. Return to normal activities tomorrow.                             Written discharge instructions were provided to the                            patient.                           - Resume previous diet.                           - Continue present medications.                           - Await pathology results.                           - Continue MiraLAX for constipation. Increase as                            needed                           - Routine office follow-up with Dr. Henrene Pastor in 4 to 6  weeks Docia Chuck. Henrene Pastor, MD 12/12/2017 3:48:13 PM This report has been signed electronically.

## 2017-12-12 NOTE — Progress Notes (Signed)
PT taken to PACU. Monitors in place. VSS. Report given to RN. 

## 2017-12-12 NOTE — Progress Notes (Signed)
Called to room to assist during endoscopic procedure.  Patient ID and intended procedure confirmed with present staff. Received instructions for my participation in the procedure from the performing physician.  

## 2017-12-12 NOTE — Patient Instructions (Signed)
Continue present medications. Increase MiraLAX for constipation as needed. Routine office follow-up with Dr. Henrene Pastor in 4 to 6 weeks. Please read handout on polyps.    YOU HAD AN ENDOSCOPIC PROCEDURE TODAY AT [facility]:   Refer to the procedure report that was given to you for any specific questions about what was found during the examination.  If the procedure report does not answer your questions, please call your gastroenterologist to clarify.  If you requested that your care partner not be given the details of your procedure findings, then the procedure report has been included in a sealed envelope for you to review at your convenience later.  YOU SHOULD EXPECT: Some feelings of bloating in the abdomen. Passage of more gas than usual.  Walking can help get rid of the air that was put into your GI tract during the procedure and reduce the bloating. If you had a lower endoscopy (such as a colonoscopy or flexible sigmoidoscopy) you may notice spotting of blood in your stool or on the toilet paper. If you underwent a bowel prep for your procedure, you may not have a normal bowel movement for a few days.  Please Note:  You might notice some irritation and congestion in your nose or some drainage.  This is from the oxygen used during your procedure.  There is no need for concern and it should clear up in a day or so.  SYMPTOMS TO REPORT IMMEDIATELY:   Following lower endoscopy (colonoscopy or flexible sigmoidoscopy):  Excessive amounts of blood in the stool  Significant tenderness or worsening of abdominal pains  Swelling of the abdomen that is new, acute  Fever of 100F or higher    For urgent or emergent issues, a gastroenterologist can be reached at any hour by calling [phone].   DIET:  We do recommend a small meal at first, but then you may proceed to your regular diet.  Drink plenty of fluids but you should avoid alcoholic beverages for 24 hours.  ACTIVITY:   You should plan to take it easy for the rest of today and you should NOT DRIVE or use heavy machinery until tomorrow (because of the sedation medicines used during the test).    FOLLOW UP: Our staff will call the number listed on your records the next business day following your procedure to check on you and address any questions or concerns that you may have regarding the information given to you following your procedure. If we do not reach you, we will leave a message.  However, if you are feeling well and you are not experiencing any problems, there is no need to return our call.  We will assume that you have returned to your regular daily activities without incident.  If any biopsies were taken you will be contacted by phone or by letter within the next 1-3 weeks.  Please call us at [phone] if you have not heard about the biopsies in 3 weeks.    SIGNATURES/CONFIDENTIALITY: You and/or your care partner have signed paperwork which will be entered into your electronic medical record.  These signatures attest to the fact that that the information above on your After Visit Summary has been reviewed and is understood.  Full responsibility of the confidentiality of this discharge information lies with you and/or your care-partner.

## 2017-12-15 ENCOUNTER — Telehealth: Payer: Self-pay

## 2017-12-15 NOTE — Telephone Encounter (Signed)
  Follow up Call-  Call back number 12/12/2017  Post procedure Call Back phone  # [phone]  Permission to leave phone message Yes  Some recent data might be hidden     Patient questions:  Do you have a fever, pain , or abdominal swelling? No. Pain Score  0 *  Have you tolerated food without any problems? Yes.    Have you been able to return to your normal activities? Yes.    Do you have any questions about your discharge instructions: Diet   No. Medications  No. Follow up visit  No.  Do you have questions or concerns about your Care? No.  Actions: * If pain score is 4 or above: No action needed, pain <4.  No problems noted per pt. maw

## 2017-12-24 ENCOUNTER — Encounter: Payer: Self-pay | Admitting: Internal Medicine

## 2017-12-29 DIAGNOSIS — G960 Cerebrospinal fluid leak: Secondary | ICD-10-CM | POA: Diagnosis not present

## 2018-01-07 DIAGNOSIS — G960 Cerebrospinal fluid leak: Secondary | ICD-10-CM | POA: Diagnosis not present

## 2018-01-19 DIAGNOSIS — G932 Benign intracranial hypertension: Secondary | ICD-10-CM | POA: Diagnosis not present

## 2018-01-21 ENCOUNTER — Ambulatory Visit: Payer: Medicare Other | Admitting: Internal Medicine

## 2018-01-21 ENCOUNTER — Encounter: Payer: Self-pay | Admitting: Internal Medicine

## 2018-01-21 VITALS — BP 132/88 | HR 85 | Ht 64.0 in | Wt 168.0 lb

## 2018-01-21 DIAGNOSIS — R110 Nausea: Secondary | ICD-10-CM

## 2018-01-21 DIAGNOSIS — K219 Gastro-esophageal reflux disease without esophagitis: Secondary | ICD-10-CM

## 2018-01-21 DIAGNOSIS — R4702 Dysphasia: Secondary | ICD-10-CM

## 2018-01-21 DIAGNOSIS — K5900 Constipation, unspecified: Secondary | ICD-10-CM

## 2018-01-21 MED ORDER — OMEPRAZOLE 40 MG PO CPDR: 40.0000 mg | DELAYED_RELEASE_CAPSULE | Freq: Two times a day (BID) | ORAL | 3 refills | Status: DC

## 2018-01-21 NOTE — Progress Notes (Signed)
HISTORY OF PRESENT ILLNESS:  [facility] is a 37 y.o. female with a history of traumatic head injury, spinal fusion surgery, Ehlers Danlos syndrome followed at [facility], history of CSF leak, and questionable blood clotting disorder.  She was evaluated December 03, 2017 as a new patient regarding chronic worsening constipation associated with worsening rectal bleeding and abdominal pain.  She subsequently underwent complete colonoscopy December 12, 2017.  The examination revealed a 5 mm transverse colon polyp which was removed and found to be a tubular adenoma.  The examination was otherwise normal including the ileum.  Follow-up colonoscopy in 5 years recommended.  For her constipation she was told to use MiraLAX and titrate as needed.  She presents today for follow-up as requested.  She is pleased to report that MiraLAX has helped her bowels.  Currently 2 bowel movements per week without abdominal complaints.  She does have a new complaint of significant reflux as manifested by pyrosis and regurgitation.  This despite omeprazole 20 mg daily.  She also reports globus type symptoms as well as intermittent solid food dysphasia.  She tells me that she has had upper endoscopy in the past and has a history of ulcers remotely [facility]).  There has been nausea but no vomiting.  REVIEW OF SYSTEMS:  All non-GI ROS negative unless otherwise stated in the HPI except for back pain, headaches, fatigue, shortness of breath, urinary frequency  Past Medical History:  Diagnosis Date  . Anemia   . Clotting disorder ([place])   . Concussion    x8, last 2010, residual homonymous Hemianopsia  . CSF leak    multiple serum pathes  . Frequent sinus infections    tx with flonase nasal spray  . GERD (gastroesophageal reflux disease)   . History of blood transfusion    over 5 yrs ago  . Hypertension   . Mastoiditis   . Migraines   . Nephrolithiasis    X5, Dr Jeffie Pollock, passed stones, no surgery  . Peptic ulcer disease   . Von  Willebrand disease ([place])    mild form per patient    Past Surgical History:  Procedure Laterality Date  . APPENDECTOMY    . CERVICAL FUSION  2018   C5-C7  . DILITATION & CURRETTAGE/HYSTROSCOPY WITH NOVASURE ABLATION N/A 08/23/2015   Procedure: DILATATION & CURETTAGE/HYSTEROSCOPY WITH NOVASURE ABLATION;  Surgeon: Vanessa Kick, MD;  Location: [facility];  Service: Gynecology;  Laterality: N/A;  . HAND TENDON SURGERY Left    left, complicated by MRSA  . LAMINECTOMY  21016   T10-T12  . spinal fluid leak patching     > 10X;[facility] - blood patching  . SPINAL FUSION  2017   T10-T11  . SPINAL FUSION  2018   T10-L1  . WISDOM TOOTH EXTRACTION      Social History MARIBELL DEMEO  reports that she has quit smoking. Her smoking use included cigarettes. She has a 3.00 pack-year smoking history. She has never used smokeless tobacco. She reports that she does not drink alcohol or use drugs.  family history includes Arthritis in her father; Diabetes in her father, maternal grandmother, and paternal grandfather; Heart disease in her brother, maternal aunt, maternal grandfather, maternal grandmother, maternal uncle, and mother; Hyperlipidemia in her father and paternal grandfather; Kidney disease in her father; Other in her unknown relative; Prostate cancer in her paternal grandfather; Stroke in her father; Stroke (age of onset: 68) in her maternal aunt.  Allergies  Allergen Reactions  . Aspirin Other (See  Comments)    REACTION: ANY BLOOD THINNERS/ DUE TO BLOOD DISORDER-VON WILDEBRAND'S   . Other Other (See Comments)    Pt Instructed by hematologist not to take blood thinners  ALL BLOOD THINNERS-CANNOT TAKE DUE TO [facility]   . Topamax [Topiramate] Other (See Comments)    HYPOTENSION AND SYNCOPE   . Levofloxacin Itching and Rash  . Levofloxacin In D5w Swelling and Rash  . Sulfamethoxazole-Trimethoprim Rash    "Body Aches"       PHYSICAL EXAMINATION: Vital signs: Ht 5\' 4"  (1.626 m)   Wt 168  lb (76.2 kg)   BMI 28.84 kg/m   Constitutional: generally well-appearing, no acute distress Psychiatric: alert and oriented x3, cooperative Eyes: extraocular movements intact, anicteric, conjunctiva pink Mouth: oral pharynx moist, no lesions Neck: supple no lymphadenopathy Cardiovascular: heart regular rate and rhythm, no murmur Lungs: clear to auscultation bilaterally Abdomen: soft, nontender, nondistended, no obvious ascites, no peritoneal signs, normal bowel sounds, no organomegaly Rectal: Omitted Extremities: no lower extremity edema bilaterally Skin: no lesions on visible extremities Neuro: No focal deficits.  Cranial nerves intact  ASSESSMENT:  1.  GERD.  Significant symptoms despite low-dose PPI. 2.  Dysphasia.  Rule out peptic stricture 3.  Chronic constipation.  Improved on MiraLAX 4.  Colonoscopy December 12, 2017 with tubular adenoma 5.  Nausea.  Likely related to poorly controlled GERD  PLAN:  1.  Reflux precautions 2.  Prescribe pantoprazole 40 mg twice daily (for now) 3.  Schedule upper endoscopy with possible esophageal dilation.The nature of the procedure, as well as the risks, benefits, and alternatives were carefully and thoroughly reviewed with the patient. Ample time for discussion and questions allowed. The patient understood, was satisfied, and agreed to proceed. 4.  Follow-up thereafter to be determined 5.  Surveillance colonoscopy around November 2024

## 2018-01-21 NOTE — Patient Instructions (Addendum)
We have sent the following medications to your pharmacy for you to pick up at your convenience:  Omeprazole - increased to twice a day  You have been scheduled for an endoscopy. Please follow written instructions given to you at your visit today. If you use inhalers (even only as needed), please bring them with you on the day of your procedure. Your physician has requested that you go to www.[email or web address].com and enter the access code given to you at your visit today. This web site gives a general overview about your procedure. However, you should still follow specific instructions given to you by our office regarding your preparation for the procedure.

## 2018-01-27 ENCOUNTER — Ambulatory Visit (AMBULATORY_SURGERY_CENTER): Payer: Medicare Other | Admitting: Internal Medicine

## 2018-01-27 ENCOUNTER — Encounter: Payer: Self-pay | Admitting: Internal Medicine

## 2018-01-27 VITALS — BP 112/68 | HR 59 | Temp 98.7°F | Resp 21 | Ht 64.0 in | Wt 168.0 lb

## 2018-01-27 DIAGNOSIS — R1319 Other dysphagia: Secondary | ICD-10-CM

## 2018-01-27 DIAGNOSIS — K210 Gastro-esophageal reflux disease with esophagitis: Secondary | ICD-10-CM | POA: Diagnosis not present

## 2018-01-27 DIAGNOSIS — R4702 Dysphasia: Secondary | ICD-10-CM | POA: Diagnosis not present

## 2018-01-27 DIAGNOSIS — R110 Nausea: Secondary | ICD-10-CM

## 2018-01-27 DIAGNOSIS — K222 Esophageal obstruction: Secondary | ICD-10-CM

## 2018-01-27 DIAGNOSIS — K219 Gastro-esophageal reflux disease without esophagitis: Secondary | ICD-10-CM

## 2018-01-27 DIAGNOSIS — R109 Unspecified abdominal pain: Secondary | ICD-10-CM

## 2018-01-27 MED ORDER — SODIUM CHLORIDE 0.9 % IV SOLN: 500.0000 mL | Freq: Once | INTRAVENOUS | Status: DC

## 2018-01-27 NOTE — Patient Instructions (Signed)
Thank you for allowing [place] to care for you today!  Await biopsy results by mail, approximately 2 weeks.  Continue to take Pantoprazole daily.  Resume previous diet tomorrow.  Post dilation diet today.  Handout provided.  See Dr Henrene Pastor in about  6 weeks for follow-up.      YOU HAD AN ENDOSCOPIC PROCEDURE TODAY AT [facility]:   Refer to the procedure report that was given to you for any specific questions about what was found during the examination.  If the procedure report does not answer your questions, please call your gastroenterologist to clarify.  If you requested that your care partner not be given the details of your procedure findings, then the procedure report has been included in a sealed envelope for you to review at your convenience later.  YOU SHOULD EXPECT: Some feelings of bloating in the abdomen. Passage of more gas than usual.  Walking can help get rid of the air that was put into your GI tract during the procedure and reduce the bloating. If you had a lower endoscopy (such as a colonoscopy or flexible sigmoidoscopy) you may notice spotting of blood in your stool or on the toilet paper. If you underwent a bowel prep for your procedure, you may not have a normal bowel movement for a few days.  Please Note:  You might notice some irritation and congestion in your nose or some drainage.  This is from the oxygen used during your procedure.  There is no need for concern and it should clear up in a day or so.  SYMPTOMS TO REPORT IMMEDIATELY:     Following upper endoscopy (EGD)  Vomiting of blood or coffee ground material  New chest pain or pain under the shoulder blades  Painful or persistently difficult swallowing  New shortness of breath  Fever of 100F or higher  Black, tarry-looking stools  For urgent or emergent issues, a gastroenterologist can be reached at any hour by calling [phone].   DIET:  We do recommend a small meal at first, but then  you may proceed to your regular diet.  Drink plenty of fluids but you should avoid alcoholic beverages for 24 hours.  ACTIVITY:  You should plan to take it easy for the rest of today and you should NOT DRIVE or use heavy machinery until tomorrow (because of the sedation medicines used during the test).    FOLLOW UP: Our staff will call the number listed on your records the next business day following your procedure to check on you and address any questions or concerns that you may have regarding the information given to you following your procedure. If we do not reach you, we will leave a message.  However, if you are feeling well and you are not experiencing any problems, there is no need to return our call.  We will assume that you have returned to your regular daily activities without incident.  If any biopsies were taken you will be contacted by phone or by letter within the next 1-3 weeks.  Please call us at [phone] if you have not heard about the biopsies in 3 weeks.    SIGNATURES/CONFIDENTIALITY: You and/or your care partner have signed paperwork which will be entered into your electronic medical record.  These signatures attest to the fact that that the information above on your After Visit Summary has been reviewed and is understood.  Full responsibility of the confidentiality of this discharge information lies with  you and/or your care-partner.

## 2018-01-27 NOTE — Progress Notes (Signed)
Spontaneous respirations throughout. VSS. Resting comfortably. To PACU on room air. Report to  RN. 

## 2018-01-27 NOTE — Progress Notes (Signed)
Called to room to assist during endoscopic procedure.  Patient ID and intended procedure confirmed with present staff. Received instructions for my participation in the procedure from the performing physician.  

## 2018-01-27 NOTE — Op Note (Signed)
[facility] Patient Name: Lorraine Weber Procedure Date: 01/27/2018 10:38 AM MRN: [id number] Endoscopist: Docia Chuck. Henrene Pastor , MD Age: 37 Referring MD:  Date of Birth: Nov 10, 1980 Gender: Female Account #: [id number] Procedure:                Upper GI endoscopy with biopsy. Maloney dilation of                            the esophagus. 78 French Indications:              Dysphagia, Esophageal reflux Medicines:                Monitored Anesthesia Care Procedure:                Pre-Anesthesia Assessment:                           - Prior to the procedure, a History and Physical                            was performed, and patient medications and                            allergies were reviewed. The patient's tolerance of                            previous anesthesia was also reviewed. The risks                            and benefits of the procedure and the sedation                            options and risks were discussed with the patient.                            All questions were answered, and informed consent                            was obtained. Prior Anticoagulants: The patient has                            taken no previous anticoagulant or antiplatelet                            agents. ASA Grade Assessment: II - A patient with                            mild systemic disease. After reviewing the risks                            and benefits, the patient was deemed in                            satisfactory condition to undergo the procedure.  After obtaining informed consent, the endoscope was                            passed under direct vision. Throughout the                            procedure, the patient's blood pressure, pulse, and                            oxygen saturations were monitored continuously. The                            Endoscope was introduced through the mouth, and                            advanced to the  second part of duodenum. The upper                            GI endoscopy was accomplished without difficulty.                            The patient tolerated the procedure well. Scope In: Scope Out: Findings:                 One benign-appearing, intrinsic moderate stenosis                            was found 39 cm from the incisors. There is also                            mild esophagitis as manifested by edema. The scope                            was withdrawn after completing the endoscopic                            survey. Dilation was performed with a Maloney                            dilator with no resistance at 52 Fr.                           The exam of the esophagus was otherwise normal.                           The stomach was normal. Because of a reported                            history of ulcer disease, biopsies were taken with                            a cold forceps for Helicobacter pylori testing  using CLOtest.                           The examined duodenum was normal.                           The cardia and gastric fundus were normal on                            retroflexion. Complications:            No immediate complications. Estimated Blood Loss:     Estimated blood loss: none. Impression:               - Benign-appearing esophageal stenosis. Dilated.                           - Normal stomach. Biopsied.                           - Normal examined duodenum. Recommendation:           1. Post dilation diet                           2. Continue pantoprazole 40 mg twice daily                           3. Office follow-up with Dr. Henrene Pastor in about 6 weeks. Docia Chuck. Henrene Pastor, MD 01/27/2018 10:52:51 AM This report has been signed electronically.

## 2018-01-28 ENCOUNTER — Telehealth: Payer: Self-pay

## 2018-01-28 LAB — HELICOBACTER PYLORI SCREEN-BIOPSY: UREASE: NEGATIVE

## 2018-01-28 NOTE — Telephone Encounter (Signed)
  Follow up Call-  Call back number 01/27/2018 12/12/2017  Post procedure Call Back phone  # [phone]  Permission to leave phone message Yes Yes  Some recent data might be hidden     Patient questions:  Do you have a fever, pain , or abdominal swelling? No. Pain Score  0 *  Have you tolerated food without any problems? Yes.    Have you been able to return to your normal activities? Yes.    Do you have any questions about your discharge instructions: Diet   No. Medications  No. Follow up visit  No.  Do you have questions or concerns about your Care? No.  Actions: * If pain score is 4 or above: No action needed, pain <4. No problems noted per pt.

## 2018-02-13 DIAGNOSIS — G894 Chronic pain syndrome: Secondary | ICD-10-CM | POA: Diagnosis not present

## 2018-02-13 DIAGNOSIS — I10 Essential (primary) hypertension: Secondary | ICD-10-CM | POA: Diagnosis not present

## 2018-02-13 DIAGNOSIS — Z1389 Encounter for screening for other disorder: Secondary | ICD-10-CM | POA: Diagnosis not present

## 2018-02-13 DIAGNOSIS — Z0001 Encounter for general adult medical examination with abnormal findings: Secondary | ICD-10-CM | POA: Diagnosis not present

## 2018-02-13 DIAGNOSIS — E063 Autoimmune thyroiditis: Secondary | ICD-10-CM | POA: Diagnosis not present

## 2018-02-13 DIAGNOSIS — N029 Recurrent and persistent hematuria with unspecified morphologic changes: Secondary | ICD-10-CM | POA: Diagnosis not present

## 2018-02-13 DIAGNOSIS — J029 Acute pharyngitis, unspecified: Secondary | ICD-10-CM | POA: Diagnosis not present

## 2018-02-13 DIAGNOSIS — G43009 Migraine without aura, not intractable, without status migrainosus: Secondary | ICD-10-CM | POA: Diagnosis not present

## 2018-03-01 ENCOUNTER — Other Ambulatory Visit: Payer: Self-pay | Admitting: Physician Assistant

## 2018-03-03 ENCOUNTER — Telehealth: Payer: Self-pay | Admitting: Physician Assistant

## 2018-03-03 MED ORDER — METOPROLOL TARTRATE 50 MG PO TABS: 50.0000 mg | ORAL_TABLET | Freq: Every day | ORAL | 0 refills | Status: DC

## 2018-03-03 NOTE — Telephone Encounter (Signed)
New message    *STAT* If patient is at the pharmacy, call can be transferred to refill team.   1. Which medications need to be refilled? (please list name of each medication and dose if known) metoprolol tartrate (LOPRESSOR) 50 MG tablet  2. Which pharmacy/location (including street and city if local pharmacy) is medication to be sent to?[id number] - [place], [place] - [address] AT [address]  3. Do they need a 30 day or 90 day supply?[place]

## 2018-03-05 DIAGNOSIS — N029 Recurrent and persistent hematuria with unspecified morphologic changes: Secondary | ICD-10-CM | POA: Diagnosis not present

## 2018-03-06 DIAGNOSIS — R319 Hematuria, unspecified: Secondary | ICD-10-CM | POA: Diagnosis not present

## 2018-03-06 DIAGNOSIS — R109 Unspecified abdominal pain: Secondary | ICD-10-CM | POA: Diagnosis not present

## 2018-03-09 ENCOUNTER — Other Ambulatory Visit (INDEPENDENT_AMBULATORY_CARE_PROVIDER_SITE_OTHER): Payer: Medicare Other

## 2018-03-09 ENCOUNTER — Ambulatory Visit: Payer: Medicare Other | Admitting: Internal Medicine

## 2018-03-09 ENCOUNTER — Encounter: Payer: Self-pay | Admitting: Internal Medicine

## 2018-03-09 VITALS — BP 140/90 | HR 76 | Ht 64.0 in | Wt 167.0 lb

## 2018-03-09 DIAGNOSIS — R945 Abnormal results of liver function studies: Secondary | ICD-10-CM

## 2018-03-09 DIAGNOSIS — K5900 Constipation, unspecified: Secondary | ICD-10-CM

## 2018-03-09 DIAGNOSIS — K219 Gastro-esophageal reflux disease without esophagitis: Secondary | ICD-10-CM

## 2018-03-09 DIAGNOSIS — R4702 Dysphasia: Secondary | ICD-10-CM | POA: Diagnosis not present

## 2018-03-09 DIAGNOSIS — R7989 Other specified abnormal findings of blood chemistry: Secondary | ICD-10-CM

## 2018-03-09 LAB — HEPATIC FUNCTION PANEL
ALT: 7 U/L (ref 0–35)
AST: 12 U/L (ref 0–37)
Albumin: 4.2 g/dL (ref 3.5–5.2)
Alkaline Phosphatase: 101 U/L (ref 39–117)
Bilirubin, Direct: 0.1 mg/dL (ref 0.0–0.3)
Total Bilirubin: 0.3 mg/dL (ref 0.2–1.2)
Total Protein: 7.1 g/dL (ref 6.0–8.3)

## 2018-03-09 MED ORDER — FAMOTIDINE 20 MG PO TABS: 20.0000 mg | ORAL_TABLET | Freq: Every day | ORAL | 11 refills | Status: DC

## 2018-03-09 NOTE — Progress Notes (Signed)
HISTORY OF PRESENT ILLNESS:  Lorraine Weber is a 38 y.o. female with a history of traumatic head injury, spinal fusion surgery, Ehlers Danlos syndrome followed at [facility], history of CSF leak, and questionable blood clotting disorder.  She has been evaluated in this office for constipation, abdominal pain, rectal bleeding, GERD, and dysphasia.  Colonoscopy December 12, 2017 revealed diminutive adenoma but was otherwise normal including intubation of the ileum.  Follow-up in 5 years recommended.  She was placed on MiraLAX.  This has helped her constipation.  Next, upper endoscopy was performed January 27, 2018.  She was found to have esophageal stricture as well as esophageal edema.  Continued on PPI twice daily.  The esophagus was dilated to 61 [place].  She follows up at this time.  Both reflux and dysphasia are better though she does have some occasional symptoms.  She is taking her first dose of PPI in the morning and her second dose at bedtime.  Her only new issue or complaint is abnormal liver tests from her PCPs office last week.  We do not have those laboratories but they have been requested.  No liver test in epic to compare.  She denies a personal history of liver disease or new medications.  She is currently being evaluated for kidney stones  REVIEW OF SYSTEMS:  All non-GI ROS negative as otherwise stated in the HPI except for back pain, blood in urine, headaches, sore throat, urinary frequency  Past Medical History:  Diagnosis Date  . Anemia   . Clotting disorder ([place])    Von Willebrand   . Concussion    x8, last 2010, residual homonymous Hemianopsia  . CSF leak    multiple serum pathes  . Frequent sinus infections    tx with flonase nasal spray  . GERD (gastroesophageal reflux disease)   . History of blood transfusion    over 5 yrs ago  . Hypertension   . Mastoiditis   . Migraines   . Nephrolithiasis    X5, Dr Lorraine Weber, passed stones, no surgery  . Peptic ulcer disease   . Von  Willebrand disease ([place])    mild form per patient    Past Surgical History:  Procedure Laterality Date  . APPENDECTOMY    . CERVICAL FUSION  2018   C5-C7  . DILITATION & CURRETTAGE/HYSTROSCOPY WITH NOVASURE ABLATION N/A 08/23/2015   Procedure: DILATATION & CURETTAGE/HYSTEROSCOPY WITH NOVASURE ABLATION;  Surgeon: Lorraine Kick, MD;  Location: [facility];  Service: Gynecology;  Laterality: N/A;  . HAND TENDON SURGERY Left    left, complicated by MRSA  . LAMINECTOMY  21016   T10-T12  . spinal fluid leak patching     > 10X;[facility] - blood patching  . SPINAL FUSION  2017   T10-T11  . SPINAL FUSION  2018   T10-L1  . WISDOM TOOTH EXTRACTION      Social History Lorraine Weber  reports that she has quit smoking. Her smoking use included cigarettes. She has a 3.00 pack-year smoking history. She has never used smokeless tobacco. She reports that she does not drink alcohol or use drugs.  family history includes Arthritis in her father; Diabetes in her father, maternal grandmother, and paternal grandfather; Heart disease in her brother, maternal aunt, maternal grandfather, maternal grandmother, maternal uncle, and mother; Hyperlipidemia in her father and paternal grandfather; Kidney disease in her father; Other in an other family member; Prostate cancer in her paternal grandfather; Stroke in her father; Stroke (age of onset: 33)  in her maternal aunt.  Allergies  Allergen Reactions  . Aspirin Other (See Comments)    REACTION: ANY BLOOD THINNERS/ DUE TO BLOOD DISORDER-VON WILDEBRAND'S   . Other Other (See Comments)    Pt Instructed by hematologist not to take blood thinners  ALL BLOOD THINNERS-CANNOT TAKE DUE TO [facility]   . Topamax [Topiramate] Other (See Comments)    HYPOTENSION AND SYNCOPE   . Levofloxacin Itching and Rash  . Levofloxacin In D5w Swelling and Rash  . Sulfamethoxazole-Trimethoprim Rash    "Body Aches"       PHYSICAL EXAMINATION: Vital signs: BP 140/90 (BP Location:  Left Arm, Patient Position: Sitting, Cuff Size: Normal)   Pulse 76   Ht 5\' 4"  (1.626 m)   Wt 167 lb (75.8 kg)   BMI 28.67 kg/m   Constitutional: generally well-appearing, no acute distress Psychiatric: alert and oriented x3, cooperative Abdomen: Not reexamined  ASSESSMENT:  1.  GERD.  Some breakthrough symptoms with current regimen 2.  Dysphasia.  Improved after esophageal dilation 3.  Chronic constipation.  Managed with MiraLAX 4.  Reports of elevated liver tests   PLAN:  1.  Advised to take PPI in the morning 30 minutes before breakfast and 30 minutes prior to dinner. 2.  Add Pepcid 20 mg at night 3.  Reflux precautions 4.  Continue MiraLAX 5.  Check liver test today 6.  Obtain outside liver tests for review and comparison 7.  She may need additional work-up should liver test abnormalities persist. 8.  If liver tests okay then routine GI follow-up 1 year

## 2018-03-09 NOTE — Patient Instructions (Signed)
Your provider has requested that you go to the basement level for lab work before leaving today. Press "B" on the elevator. The lab is located at the first door on the left as you exit the elevator.  Make sure you are taking your Omeprazole in the morning before breakfast and then again before dinner.  We have sent the following medications to your pharmacy for you to pick up at your convenience:  Pepcid - at bedtime.

## 2018-03-11 ENCOUNTER — Ambulatory Visit: Payer: Medicare Other | Admitting: Internal Medicine

## 2018-03-11 DIAGNOSIS — N132 Hydronephrosis with renal and ureteral calculous obstruction: Secondary | ICD-10-CM | POA: Diagnosis not present

## 2018-03-11 DIAGNOSIS — R319 Hematuria, unspecified: Secondary | ICD-10-CM | POA: Diagnosis not present

## 2018-03-18 ENCOUNTER — Other Ambulatory Visit: Payer: Self-pay | Admitting: Urology

## 2018-03-18 DIAGNOSIS — N202 Calculus of kidney with calculus of ureter: Secondary | ICD-10-CM | POA: Diagnosis not present

## 2018-03-19 ENCOUNTER — Encounter (HOSPITAL_COMMUNITY)
Admission: AD | Disposition: A | Discharge status: Home / Self Care | Payer: Self-pay | Source: Other Acute Inpatient Hospital | Attending: Urology

## 2018-03-19 ENCOUNTER — Ambulatory Visit (HOSPITAL_COMMUNITY)
Admission: AD | Admit: 2018-03-19 | Discharge: 2018-03-19 | Disposition: A | Discharge status: Home / Self Care | Payer: Medicare Other | Source: Other Acute Inpatient Hospital | Attending: Urology | Admitting: Urology

## 2018-03-19 ENCOUNTER — Other Ambulatory Visit: Payer: Self-pay

## 2018-03-19 ENCOUNTER — Ambulatory Visit (HOSPITAL_COMMUNITY): Payer: Medicare Other

## 2018-03-19 ENCOUNTER — Encounter (HOSPITAL_COMMUNITY): Payer: Self-pay | Admitting: *Deleted

## 2018-03-19 DIAGNOSIS — Z881 Allergy status to other antibiotic agents status: Secondary | ICD-10-CM | POA: Insufficient documentation

## 2018-03-19 DIAGNOSIS — I10 Essential (primary) hypertension: Secondary | ICD-10-CM | POA: Insufficient documentation

## 2018-03-19 DIAGNOSIS — N201 Calculus of ureter: Secondary | ICD-10-CM | POA: Diagnosis not present

## 2018-03-19 DIAGNOSIS — Z01818 Encounter for other preprocedural examination: Secondary | ICD-10-CM | POA: Diagnosis not present

## 2018-03-19 DIAGNOSIS — Z886 Allergy status to analgesic agent status: Secondary | ICD-10-CM | POA: Diagnosis not present

## 2018-03-19 DIAGNOSIS — Z79899 Other long term (current) drug therapy: Secondary | ICD-10-CM | POA: Insufficient documentation

## 2018-03-19 DIAGNOSIS — Z888 Allergy status to other drugs, medicaments and biological substances status: Secondary | ICD-10-CM | POA: Diagnosis not present

## 2018-03-19 DIAGNOSIS — N202 Calculus of kidney with calculus of ureter: Secondary | ICD-10-CM | POA: Diagnosis not present

## 2018-03-19 DIAGNOSIS — N200 Calculus of kidney: Secondary | ICD-10-CM | POA: Diagnosis not present

## 2018-03-19 DIAGNOSIS — K219 Gastro-esophageal reflux disease without esophagitis: Secondary | ICD-10-CM | POA: Insufficient documentation

## 2018-03-19 HISTORY — PX: EXTRACORPOREAL SHOCK WAVE LITHOTRIPSY: SHX1557

## 2018-03-19 LAB — PREGNANCY, URINE: Preg Test, Ur: NEGATIVE

## 2018-03-19 SURGERY — LITHOTRIPSY, ESWL
Anesthesia: LOCAL | Laterality: Left

## 2018-03-19 MED ORDER — SODIUM CHLORIDE 0.9 % IV SOLN
INTRAVENOUS | Status: DC
  Administered 2018-03-19: 11:00:00 via INTRAVENOUS

## 2018-03-19 MED ORDER — DIAZEPAM 5 MG PO TABS
10.0000 mg | ORAL_TABLET | ORAL | Status: AC
  Administered 2018-03-19: 10 mg via ORAL
  Filled 2018-03-19: qty 2

## 2018-03-19 MED ORDER — CEPHALEXIN 500 MG PO CAPS
500.0000 mg | ORAL_CAPSULE | Freq: Once | ORAL | Status: AC
  Administered 2018-03-19: 500 mg via ORAL
  Filled 2018-03-19 (×2): qty 1

## 2018-03-19 MED ORDER — CIPROFLOXACIN HCL 500 MG PO TABS: 500.0000 mg | ORAL_TABLET | ORAL | Status: DC

## 2018-03-19 MED ORDER — DIPHENHYDRAMINE HCL 25 MG PO CAPS
25.0000 mg | ORAL_CAPSULE | ORAL | Status: AC
  Administered 2018-03-19: 25 mg via ORAL
  Filled 2018-03-19: qty 1

## 2018-03-19 NOTE — H&P (Signed)
I have ureteral stone.  HPI: Lorraine Weber is a 38 year-old female patient who was referred by Dr. Redmond School, MD who is here for ureteral stone.  The problem is on the left side. She first stated noticing pain on 02/14/2018. This is not her first kidney stone. She is currently having flank pain. She denies having back pain, groin pain, nausea, vomiting, fever, and chills. Pain is occuring on the left side.   She has never had surgical treatment for calculi in the past.   She underwent a CT scan of the abdomen and pelvis March 11, 2018. This revealed a 7 mm left distal stone. It was visible on the scout. She had other scattered bilateral renal stones the largest of which was about 5 mm.   She has LLQ and urgency now. She hasn't seen a stone. She usually passes her stones. She is staying hydrated. KUB today confirms a 6 mm left distal stone.   She has possible vWF from a childhood dx, but undergone spinal fusion, uterine ablation and appendectomy with no testing or post-op bleeding. She had a CSF leak from her thoracic fusion and tolerated lumbar puncture with bleeding.     ALLERGIES: Aspirin TABS Bactrim Levaquin TABS Topamax    MEDICATIONS: Metoprolol Tartrate 50 mg tablet  Omeprazole 40 mg capsule,delayed release  Fioricet  Flonase Allergy Relief 50 mcg/actuation spray, suspension  Miralax  Ondansetron Hcl 4 mg tablet  Stool Softener  Tizanidine Hcl     GU PSH: None     PSH Notes: Appendectomy, Wrist Surgery,   Thoracic spinal fusion-2017   Laminectomy-2016   NON-GU PSH: Appendectomy - 2010 Neck Spinal Fusion Wrist Arthroscopy/surgery    GU PMH: Acute Cystitis/UTI, Acute Cystitis - 2014 Gross hematuria, Gross Hematuria - 2014 Oth GU systems Signs/Symptoms, Bladder Pain - 2014 Other microscopic hematuria, Microscopic hematuria - 2014 Renal calculus, Kidney stone on left side - 2014, Kidney stone on right side, - 2014 RLQ pain, Abdominal Pain In The Right Lower  Belly (RLQ) - 2014 RUQ pain, Abdominal Pain In The Right Upper Belly (RUQ) - 2014 Urinary Frequency, Increased urinary frequency - 2014      PMH Notes:  2008-02-29 12:45:40 - Note: Normal Routine History And Physical Adult  2008-02-29 13:23:30 - Note: Urinary Tract Infection  2007-08-24 12:03:25 - Note: Von Willebrand's Disease Type 1   NON-GU PMH: Personal history of other diseases of the digestive system, History of esophageal reflux - 2014 Personal history of other diseases of the nervous system and sense organs, History of migraine headaches - 2014 Acute gastric ulcer with hemorrhage Arthritis GERD    FAMILY HISTORY: Blood In Urine - Father, Mother Death of family member - Father, Mother Hypertension - Father, Mother nephrolithiasis - Father renal failure - Mother stroke - Father   SOCIAL HISTORY: Marital Status: Married Preferred Language: English; Ethnicity: Not Hispanic Or Latino; Race: White Current Smoking Status: Patient does not smoke anymore. Has not smoked since 03/14/2014. Smoked for 10 years. Smoked 1/2 pack per day.   Tobacco Use Assessment Completed: Used Tobacco in last 30 days? Has never drank.  Does not drink caffeine. Patient's occupation is/was disabled.     Notes: Alcohol Use, Occupation:, Caffeine Use, Tobacco Use, Marital History - Single   REVIEW OF SYSTEMS:    GU Review Female:   painful intercourse, weak stream. Patient reports frequent urination, hard to postpone urination, burning /pain with urination, trouble starting your stream, and have to strain to urinate. Patient denies  get up at night to urinate, leakage of urine, stream starts and stops, and being pregnant.  Gastrointestinal (Upper):   Patient reports nausea, vomiting, and indigestion/ heartburn.   Gastrointestinal (Lower):   Patient reports constipation. Patient denies diarrhea.  Constitutional:   Patient reports night sweats and fatigue. Patient denies fever and weight loss.  Skin:    Patient denies skin rash/ lesion and itching.  Eyes:   Patient reports double vision. Patient denies blurred vision.  Ears/ Nose/ Throat:   Patient denies sore throat and sinus problems.  Hematologic/Lymphatic:   Patient reports easy bruising. Patient denies swollen glands.  Cardiovascular:   Patient denies leg swelling and chest pains.  Respiratory:   Patient denies cough and shortness of breath.  Endocrine:   Patient denies excessive thirst.  Musculoskeletal:   Patient reports back pain. Patient denies joint pain.  Neurological:   Patient reports dizziness and headaches.   Psychologic:   Patient denies depression and anxiety.   VITAL SIGNS:      03/18/2018 02:45 PM  Weight 168 lb / 76.2 kg  Height 64 in / 162.56 cm  BP 148/86 mmHg  Pulse 73 /min  Temperature 97.9 F / 36.6 C  BMI 28.8 kg/m   MULTI-SYSTEM PHYSICAL EXAMINATION:    Constitutional: Well-nourished. No physical deformities. Normally developed. Good grooming.  Neck: Neck symmetrical, not swollen. Normal tracheal position.  Respiratory: No labored breathing, no use of accessory muscles.   Cardiovascular: Normal temperature, normal extremity pulses, no swelling, no varicosities.  Skin: No paleness, no jaundice, no cyanosis. No lesion, no ulcer, no rash.  Neurologic / Psychiatric: Oriented to time, oriented to place, oriented to person. No depression, no anxiety, no agitation.  Gastrointestinal: No mass, no tenderness, no rigidity, non obese abdomen.  Eyes: Normal conjunctivae. Normal eyelids.  Ears, Nose, Mouth, and Throat: Left ear no scars, no lesions, no masses. Right ear no scars, no lesions, no masses. Nose no scars, no lesions, no masses. Normal hearing. Normal lips.  Musculoskeletal: Normal gait and station of head and neck.     PAST DATA REVIEWED:  Source Of History:  Patient   PROCEDURES:         KUB - [id number]  A single view of the abdomen is obtained.  Calculi:  6 mm left distal stone       The bones  appeared normal. The bowel gas pattern appeared normal. The soft tissues were unremarkable.         Urinalysis w/Scope Dipstick Dipstick Cont'd Micro  Color: Red Bilirubin: Neg mg/dL WBC/hpf: 0 - 5/hpf  Appearance: Cloudy Ketones: Neg mg/dL RBC/hpf: 20 - 40/hpf  Specific Gravity: 1.025 Blood: 3+ ery/uL Bacteria: NS (Not Seen)  pH: 5.5 Protein: Trace mg/dL Cystals: NS (Not Seen)  Glucose: Neg mg/dL Urobilinogen: 0.2 mg/dL Casts: NS (Not Seen)    Nitrites: Neg Trichomonas: Not Present    Leukocyte Esterase: Trace leu/uL Mucous: Present      Epithelial Cells: 0 - 5/hpf      Yeast: NS (Not Seen)      Sperm: Not Present    Notes: Microscopic done on unconcentrated urine    ASSESSMENT:      ICD-10 Details  1 GU:   Ureteral calculus - N20.1   2   Renal calculus - N20.0 Stable   PLAN:            Medications Stop Meds: Fioricet 50 mg-325 mg-40 mg tablet Oral  Start: 07/01/2006  Discontinue: 03/18/2018  - Reason:  The medication cycle was completed.            Orders X-Rays: KUB          Schedule Return Visit/Planned Activity: Next Available Appointment - Schedule Surgery          Document Letter(s):  Created for Patient: Clinical Summary         Notes:   I discussed with the patient the nature risks and benefits of continued stone passage, off label use of alpha blockers, shockwave lithotripsy or ureteroscopy. We went over the anatomy and KUB image. We discussed expected complications and rates of stage procedure. All questions answered. She elects to proceed with left shockwave lithotripsy. I also discussed one of my colleagues would likely be doing the procedure.   In the long run she needs a 79-KFEX urine, metabolic evaluation. She also might consider a staged ureteroscopy to clear out the kidneys in the future.    After a thorough review of the management options for the patient's condition the patient  elected to proceed with surgical therapy as noted above. We have  discussed the potential benefits and risks of the procedure, side effects of the proposed treatment, the likelihood of the patient achieving the goals of the procedure, and any potential problems that might occur during the procedure or recuperation. Informed consent has been obtained.

## 2018-03-19 NOTE — Interval H&P Note (Signed)
History and Physical Interval Note:  03/19/2018 10:15 AM  Lorraine Weber  has presented today for surgery, with the diagnosis of LEFT DISTAL STONE  The various methods of treatment have been discussed with the patient and family. After consideration of risks, benefits and other options for treatment, the patient has consented to  Procedure(s): EXTRACORPOREAL SHOCK WAVE LITHOTRIPSY (ESWL) (Left) as a surgical intervention .  The patient's history has been reviewed, patient examined, no change in status, stable for surgery.  I have reviewed the patient's chart and labs.  Questions were answered to the patient's satisfaction.     Tamana Hatfield A Glyn Zendejas

## 2018-03-20 ENCOUNTER — Encounter (HOSPITAL_COMMUNITY): Payer: Self-pay | Admitting: Urology

## 2018-03-27 ENCOUNTER — Other Ambulatory Visit: Payer: Self-pay | Admitting: Cardiovascular Disease

## 2018-04-02 DIAGNOSIS — N200 Calculus of kidney: Secondary | ICD-10-CM | POA: Diagnosis not present

## 2018-04-05 ENCOUNTER — Other Ambulatory Visit: Payer: Self-pay | Admitting: Cardiovascular Disease

## 2018-04-07 DIAGNOSIS — N201 Calculus of ureter: Secondary | ICD-10-CM | POA: Diagnosis not present

## 2018-04-15 DIAGNOSIS — G960 Cerebrospinal fluid leak: Secondary | ICD-10-CM | POA: Diagnosis not present

## 2018-04-24 DIAGNOSIS — Z1231 Encounter for screening mammogram for malignant neoplasm of breast: Secondary | ICD-10-CM | POA: Diagnosis not present

## 2018-06-01 DIAGNOSIS — J329 Chronic sinusitis, unspecified: Secondary | ICD-10-CM | POA: Diagnosis not present

## 2018-06-01 DIAGNOSIS — G43909 Migraine, unspecified, not intractable, without status migrainosus: Secondary | ICD-10-CM | POA: Diagnosis not present

## 2018-06-01 DIAGNOSIS — G894 Chronic pain syndrome: Secondary | ICD-10-CM | POA: Diagnosis not present

## 2018-06-25 ENCOUNTER — Ambulatory Visit: Payer: Medicare Other | Admitting: Cardiovascular Disease

## 2018-07-28 DIAGNOSIS — G960 Cerebrospinal fluid leak: Secondary | ICD-10-CM | POA: Diagnosis not present

## 2018-08-12 DIAGNOSIS — M5412 Radiculopathy, cervical region: Secondary | ICD-10-CM | POA: Diagnosis not present

## 2018-08-17 DIAGNOSIS — Z1231 Encounter for screening mammogram for malignant neoplasm of breast: Secondary | ICD-10-CM | POA: Diagnosis not present

## 2018-08-19 ENCOUNTER — Other Ambulatory Visit: Payer: Self-pay | Admitting: Obstetrics and Gynecology

## 2018-08-19 DIAGNOSIS — R928 Other abnormal and inconclusive findings on diagnostic imaging of breast: Secondary | ICD-10-CM

## 2018-08-21 ENCOUNTER — Ambulatory Visit
Admission: RE | Admit: 2018-08-21 | Discharge: 2018-08-21 | Disposition: A | Discharge status: Home / Self Care | Payer: Medicare Other | Source: Ambulatory Visit | Attending: Obstetrics and Gynecology | Admitting: Obstetrics and Gynecology

## 2018-08-21 ENCOUNTER — Ambulatory Visit: Payer: Medicare Other

## 2018-08-21 DIAGNOSIS — R928 Other abnormal and inconclusive findings on diagnostic imaging of breast: Secondary | ICD-10-CM | POA: Diagnosis not present

## 2018-08-24 ENCOUNTER — Telehealth: Payer: Self-pay | Admitting: *Deleted

## 2018-08-24 NOTE — Telephone Encounter (Signed)

## 2018-08-25 ENCOUNTER — Ambulatory Visit: Payer: Medicare Other | Admitting: Cardiovascular Disease

## 2018-08-25 ENCOUNTER — Other Ambulatory Visit: Payer: Self-pay

## 2018-08-25 VITALS — BP 146/92 | HR 73 | Temp 98.6°F | Ht 65.0 in | Wt 168.0 lb

## 2018-08-25 DIAGNOSIS — Z8249 Family history of ischemic heart disease and other diseases of the circulatory system: Secondary | ICD-10-CM

## 2018-08-25 DIAGNOSIS — Q796 Ehlers-Danlos syndrome, unspecified: Secondary | ICD-10-CM | POA: Diagnosis not present

## 2018-08-25 DIAGNOSIS — I10 Essential (primary) hypertension: Secondary | ICD-10-CM

## 2018-08-25 MED ORDER — LOSARTAN POTASSIUM 25 MG PO TABS: 25.0000 mg | ORAL_TABLET | Freq: Every day | ORAL | 3 refills | Status: DC

## 2018-08-25 NOTE — Patient Instructions (Addendum)
Medication Instructions:  START Losartan 25 mg once daily Please be sure to take the Metoprolol Succinate 50 mg once daily  -do not take Metoprolol Tartrate  If you need a refill on your cardiac medications before your next appointment, please call your pharmacy.   Lab work: None ordered If you have labs (blood work) drawn today and your tests are completely normal, you will receive your results only by: Marland Kitchen MyChart Message (if you have MyChart) OR . A paper copy in the mail If you have any lab test that is abnormal or we need to change your treatment, we will call you to review the results.  Testing/Procedures: None ordered  Follow-Up: At [facility], you and your health needs are our priority.  As part of our continuing mission to provide you with exceptional heart care, we have created designated Provider Care Teams.  These Care Teams include your primary Cardiologist (physician) and Advanced Practice Providers (APPs -  Physician Assistants and Nurse Practitioners) who all work together to provide you with the care you need, when you need it. You will need a follow up appointment in 12 months.  Please call our office 2 months in advance to schedule this appointment.  You may see Sanda Klein, MD or one of the following Advanced Practice Providers on your designated Care Team: [place], [place] . Fabian Sharp, PA-C  Please keep a daily log of your blood pressures and heart rate and call back or send a MyChart message in 2 weeks with the readings.

## 2018-08-25 NOTE — Progress Notes (Signed)
Cardiology Office Note    Date:  08/25/2018   ID:  CYNTHYA YAM, DOB Jan 05, 1981, MRN [id number]  PCP:  Redmond School, MD  Cardiologist:  Reola Calkins) Sanda Klein, MD   No chief complaint on file.   History of Present Illness:  Lorraine Weber is a 38 y.o. female with a family history of Ehlers-Danlos syndrome with vascular manifestations and personal history of joint dislocations and skin hyperextensibility here for evaluation of possible vascular complications of EDS. Her brother had an aortic dissection around age 35 and had to undergo emergency surgery. Reportedly he has undergone genetic testing and his 2 daughters have also been confirmed to have the disorder.  She has undergone an echocardiogram which does not show evidence of mitral valve prolapse and a CT angiogram of the aorta does not show evidence of dissection or aneurysm.  She continues to have issues with dizziness when she is sitting upright, dating back to a CSF leak that she never feels she fully recovered from.  She is quite sedentary for the same reason.  The patient specifically denies any chest pain at rest or exertion, dyspnea at rest or with exertion, orthopnea, paroxysmal nocturnal dyspnea, syncope, palpitations, focal neurological deficits, intermittent claudication, lower extremity edema, unexplained weight gain, cough, hemoptysis or wheezing.  She had early onset hypertension in her late teens and her blood pressure continues to write relatively high with diastolic blood pressure in the low 90s.  She bears a diagnosis of idiopathic peripheral neuropathy.  In addition to her brother with aortic dissection there is a history of hyperlipidemia, hypertension and myocardial infarction in her father who has also had DVT and stroke. She also has a maternal aunt who has hyperlipidemia and coronary artery disease and stroke  Past Medical History:  Diagnosis Date  . Anemia   . Clotting disorder ([place])    Von Willebrand    . Concussion    x8, last 2010, residual homonymous Hemianopsia  . CSF leak    multiple serum pathes  . Frequent sinus infections    tx with flonase nasal spray  . GERD (gastroesophageal reflux disease)   . History of blood transfusion    over 5 yrs ago  . Hypertension   . Mastoiditis   . Migraines   . Nephrolithiasis    X5, Dr Jeffie Pollock, passed stones, no surgery  . Peptic ulcer disease   . Von Willebrand disease ([place])    mild form per patient    Past Surgical History:  Procedure Laterality Date  . APPENDECTOMY    . CERVICAL FUSION  2018   C5-C7  . DILITATION & CURRETTAGE/HYSTROSCOPY WITH NOVASURE ABLATION N/A 08/23/2015   Procedure: DILATATION & CURETTAGE/HYSTEROSCOPY WITH NOVASURE ABLATION;  Surgeon: Vanessa Kick, MD;  Location: [facility];  Service: Gynecology;  Laterality: N/A;  . EXTRACORPOREAL SHOCK WAVE LITHOTRIPSY Left 03/19/2018   Procedure: EXTRACORPOREAL SHOCK WAVE LITHOTRIPSY (ESWL);  Surgeon: Bjorn Loser, MD;  Location: [facility];  Service: Urology;  Laterality: Left;  . HAND TENDON SURGERY Left    left, complicated by MRSA  . LAMINECTOMY  21016   T10-T12  . spinal fluid leak patching     > 10X;[facility] - blood patching  . SPINAL FUSION  2017   T10-T11  . SPINAL FUSION  2018   T10-L1  . WISDOM TOOTH EXTRACTION      Current Medications: Outpatient Medications Prior to Visit  Medication Sig Dispense Refill  . acetaminophen (TYLENOL) 650 MG CR tablet Take 650 mg  by mouth at bedtime as needed for pain.     . butalbital-acetaminophen-caffeine (FIORICET WITH CODEINE) 50-325-40-30 MG capsule Take 1 capsule by mouth every 6 (six) hours as needed for headache.    [name] Calcium (STOOL SOFTENER PO) Take 1 tablet by mouth daily.    . famotidine (PEPCID) 20 MG tablet Take 1 tablet (20 mg total) by mouth at bedtime. 30 tablet 11  . fluticasone (FLONASE) 50 MCG/ACT nasal spray Place 1 spray into both nostrils daily.    Marland Kitchen gabapentin (NEURONTIN) 300 MG capsule Take 1 capsule  by mouth 3 (three) times daily.    . metoprolol succinate (TOPROL-XL) 50 MG 24 hr tablet TAKE 1 TABLET BY MOUTH EVERY DAY 90 tablet 1  . metoprolol tartrate (LOPRESSOR) 50 MG tablet TAKE 1 TABLET BY MOUTH EVERY DAY 30 tablet 1  . omeprazole (PRILOSEC) 40 MG capsule Take 1 capsule (40 mg total) by mouth 2 (two) times daily. 180 capsule 3  . ondansetron (ZOFRAN) 4 MG tablet TAKE 1 TABLET EVERY 8 HOURS AS NEEDED FOR NAUSEA  0  . polyethylene glycol powder (GLYCOLAX/MIRALAX) powder Take 17 g by mouth daily.    Marland Kitchen tiZANidine (ZANAFLEX) 2 MG tablet Take 1-2 tablets by mouth every 8 (eight) hours as needed.  1  . traMADol (ULTRAM) 50 MG tablet TAKE 1 TO 2 TABLETS 4 TIMES A DAY AS NEEDED  2  . furosemide (LASIX) 20 MG tablet Take 20 mg by mouth daily.  11  . KLOR-CON M10 10 MEQ tablet Take 10 mEq by mouth 2 (two) times daily.  11   No facility-administered medications prior to visit.      Allergies:   Aspirin, Other, Topamax [topiramate], Levofloxacin, Levofloxacin in d5w, and Sulfamethoxazole-trimethoprim   Social History   Socioeconomic History  . Marital status: Married    Spouse name: Not on file  . Number of children: 0  . Years of education: Not on file  . Highest education level: Not on file  Occupational History  . Occupation: [employer]    Employer: [employer]  . Occupation: disabled  Social Needs  . Financial resource strain: Not on file  . Food insecurity    Worry: Not on file    Inability: Not on file  . Transportation needs    Medical: Not on file    Non-medical: Not on file  Tobacco Use  . Smoking status: Former Smoker    Packs/day: 0.30    Years: 10.00    Pack years: 3.00    Types: Cigarettes  . Smokeless tobacco: Never Used  . Tobacco comment: smoked age 51-present, up to 1 ppd.05/24/14 1/2 ppd  Substance and Sexual Activity  . Alcohol use: No    Alcohol/week: 0.0 standard drinks  . Drug use: No  . Sexual activity: Yes    Partners: Male    Birth  control/protection: None    Comment: IUD removed 06/2015  Lifestyle  . Physical activity    Days per week: Not on file    Minutes per session: Not on file  . Stress: Not on file  Relationships  . Social Herbalist on phone: Not on file    Gets together: Not on file    Attends religious service: Not on file    Active member of club or organization: Not on file    Attends meetings of clubs or organizations: Not on file    Relationship status: Not on file  Other  Topics Concern  . Not on file  Social History Narrative  . Not on file     Family History:  The patient's family history includes Arthritis in her father; Diabetes in her father, maternal grandmother, and paternal grandfather; Heart disease in her brother, maternal aunt, maternal grandfather, maternal grandmother, maternal uncle, and mother; Hyperlipidemia in her father and paternal grandfather; Kidney disease in her father; Other in an other family member; Prostate cancer in her paternal grandfather; Stroke in her father; Stroke (age of onset: 52) in her maternal aunt.   ROS:   Please see the history of present illness.    ROS all other systems are reviewed and are negative  PHYSICAL EXAM:   VS:  BP (!) 146/92   Pulse 73   Temp 98.6 F (37 C) (Temporal)   Ht 5\' 5"  (1.651 m)   Wt 168 lb (76.2 kg)   SpO2 98%   BMI 27.96 kg/m     General: Alert, oriented x3, no distress Head: no evidence of trauma, PERRL, EOMI, no exophtalmos or lid lag, no myxedema, no xanthelasma; normal ears, nose and oropharynx Neck: normal jugular venous pulsations and no hepatojugular reflux; brisk carotid pulses without delay and no carotid bruits Chest: clear to auscultation, no signs of consolidation by percussion or palpation, normal fremitus, symmetrical and full respiratory excursions Cardiovascular: normal position and quality of the apical impulse, regular rhythm, normal first and second heart sounds, no murmurs, rubs or gallops  Abdomen: no tenderness or distention, no masses by palpation, no abnormal pulsatility or arterial bruits, normal bowel sounds, no hepatosplenomegaly Extremities: no clubbing, cyanosis or edema; 2+ radial, ulnar and brachial pulses bilaterally; 2+ right femoral, posterior tibial and dorsalis pedis pulses; 2+ left femoral, posterior tibial and dorsalis pedis pulses; no subclavian or femoral bruits Neurological: grossly nonfocal Psych: Normal mood and affect   Wt Readings from Last 3 Encounters:  08/25/18 168 lb (76.2 kg)  03/19/18 170 lb (77.1 kg)  03/09/18 167 lb (75.8 kg)      Studies/Labs Reviewed:   EKG: Tracing ordered today shows normal sinus rhythm and is a normal ECG  Recent Labs: 09/16/2017: BUN 18; Creatinine, Ser 0.95; Hemoglobin 15.1; Platelets 258; Potassium 3.4; Sodium 139 03/09/2018: ALT 7   Lipid Panel    Component Value Date/Time   CHOL 128 06/28/2016 1531   TRIG 98 06/28/2016 1531   HDL 48 06/28/2016 1531   LDLCALC 60 06/28/2016 1531  02/13/2018 total cholesterol 173, HDL 53, LDL 100, triglycerides 98  Additional studies/ records that were reviewed today include:  Notes from [facility] and neurology offices.  Echocardiogram from August 2016 showing normal dimensions of the aortic root, mild aortic insufficiency, mild mitral insufficiency without mitral valve prolapse. Note LVEF 60-65% and normal diastolic left ventricular function. Reported elevation in systolic PA pressure 36 mmHg (RV-RA gradient 28).  ASSESSMENT:    No diagnosis found.   PLAN:  In order of problems listed above:  1. Ehlers-Danlos sd.:  No evidence of cardiac or aortic involvement by echo or CT angiography. I've asked her again to try to obtain the results of her brother's genetic testing.  2. HTN: Tolerating metoprolol as long as she takes it at night, otherwise it causes excessive fatigue.  We will add losartan for its theoretical advantage in preventing aneurysm  formation.   4. FHx CAD: Her lipid profile is favorable.    Medication Adjustments/Labs and Tests Ordered: Current medicines are reviewed at length with the patient today.  Concerns regarding medicines are outlined above.  Medication changes, Labs and Tests ordered today are listed in the Patient Instructions below. Patient Instructions  Medication Instructions:  START Losartan 25 mg once daily Please be sure to take the Metoprolol Succinate 50 mg once daily  -do not take Metoprolol Tartrate  If you need a refill on your cardiac medications before your next appointment, please call your pharmacy.   Lab work: None ordered If you have labs (blood work) drawn today and your tests are completely normal, you will receive your results only by: Marland Kitchen MyChart Message (if you have MyChart) OR . A paper copy in the mail If you have any lab test that is abnormal or we need to change your treatment, we will call you to review the results.  Testing/Procedures: None ordered  Follow-Up: At [facility], you and your health needs are our priority.  As part of our continuing mission to provide you with exceptional heart care, we have created designated Provider Care Teams.  These Care Teams include your primary Cardiologist (physician) and Advanced Practice Providers (APPs -  Physician Assistants and Nurse Practitioners) who all work together to provide you with the care you need, when you need it. You will need a follow up appointment in 12 months.  Please call our office 2 months in advance to schedule this appointment.  You may see Sanda Klein, MD or one of the following Advanced Practice Providers on your designated Care Team: [place], [place] . Fabian Sharp, PA-C  Please keep a daily log of your blood pressures and heart rate and call back or send a MyChart message in 2 weeks with the readings.        Signed, Sanda Klein, MD  08/25/2018 4:28 PM    [facility]  [place], [place], [place]  [address] Phone: [phone]; Fax: [phone]

## 2018-08-26 ENCOUNTER — Encounter: Payer: Self-pay | Admitting: Cardiovascular Disease

## 2018-08-26 NOTE — Addendum Note (Signed)
Addended by: Diana Eves on: 08/26/2018 05:15 PM   Modules accepted: Orders

## 2018-08-27 ENCOUNTER — Other Ambulatory Visit: Payer: Self-pay

## 2018-09-02 DIAGNOSIS — M47812 Spondylosis without myelopathy or radiculopathy, cervical region: Secondary | ICD-10-CM | POA: Diagnosis not present

## 2018-09-02 DIAGNOSIS — M4802 Spinal stenosis, cervical region: Secondary | ICD-10-CM | POA: Diagnosis not present

## 2018-09-02 DIAGNOSIS — G960 Cerebrospinal fluid leak: Secondary | ICD-10-CM | POA: Diagnosis not present

## 2018-09-05 ENCOUNTER — Other Ambulatory Visit: Payer: Self-pay | Admitting: Cardiovascular Disease

## 2018-09-24 DIAGNOSIS — M5412 Radiculopathy, cervical region: Secondary | ICD-10-CM | POA: Diagnosis not present

## 2018-09-25 DIAGNOSIS — G43909 Migraine, unspecified, not intractable, without status migrainosus: Secondary | ICD-10-CM | POA: Diagnosis not present

## 2018-09-25 DIAGNOSIS — J309 Allergic rhinitis, unspecified: Secondary | ICD-10-CM | POA: Diagnosis not present

## 2018-09-25 DIAGNOSIS — J329 Chronic sinusitis, unspecified: Secondary | ICD-10-CM | POA: Diagnosis not present

## 2018-11-02 DIAGNOSIS — R61 Generalized hyperhidrosis: Secondary | ICD-10-CM | POA: Diagnosis not present

## 2018-11-11 DIAGNOSIS — E782 Mixed hyperlipidemia: Secondary | ICD-10-CM | POA: Diagnosis not present

## 2018-11-11 DIAGNOSIS — I10 Essential (primary) hypertension: Secondary | ICD-10-CM | POA: Diagnosis not present

## 2018-11-11 DIAGNOSIS — E1129 Type 2 diabetes mellitus with other diabetic kidney complication: Secondary | ICD-10-CM | POA: Diagnosis not present

## 2018-11-11 DIAGNOSIS — G894 Chronic pain syndrome: Secondary | ICD-10-CM | POA: Diagnosis not present

## 2018-11-23 DIAGNOSIS — R519 Headache, unspecified: Secondary | ICD-10-CM | POA: Diagnosis not present

## 2018-11-23 DIAGNOSIS — G9600 Cerebrospinal fluid leak, unspecified: Secondary | ICD-10-CM | POA: Diagnosis not present

## 2018-11-23 DIAGNOSIS — R112 Nausea with vomiting, unspecified: Secondary | ICD-10-CM | POA: Diagnosis not present

## 2018-11-23 DIAGNOSIS — H532 Diplopia: Secondary | ICD-10-CM | POA: Diagnosis not present

## 2018-11-30 ENCOUNTER — Other Ambulatory Visit: Payer: Self-pay

## 2018-11-30 MED ORDER — OMEPRAZOLE 40 MG PO CPDR: 40.0000 mg | DELAYED_RELEASE_CAPSULE | Freq: Two times a day (BID) | ORAL | 1 refills | Status: DC

## 2018-12-07 ENCOUNTER — Telehealth: Payer: Self-pay | Admitting: Cardiovascular Disease

## 2018-12-07 ENCOUNTER — Telehealth: Payer: Self-pay | Admitting: Internal Medicine

## 2018-12-07 ENCOUNTER — Other Ambulatory Visit: Payer: Self-pay | Admitting: Cardiovascular Disease

## 2018-12-07 MED ORDER — METOPROLOL SUCCINATE ER 50 MG PO TB24: 50.0000 mg | ORAL_TABLET | Freq: Every day | ORAL | 2 refills | Status: DC

## 2018-12-07 MED ORDER — LOSARTAN POTASSIUM 25 MG PO TABS: 25.0000 mg | ORAL_TABLET | Freq: Every day | ORAL | 2 refills | Status: DC

## 2018-12-07 NOTE — Telephone Encounter (Signed)
°*  STAT* If patient is at the pharmacy, call can be transferred to refill team.   1. Which medications need to be refilled? (please list name of each medication and dose if known) * new prescription for Metoprolol and Losartan- changing pharmacy  2. Which pharmacy/location (including street and city if local pharmacy) is medication to be sent to? [facility]- [phone]  3. Do they need a 30 day or 90 day supply? 90 days and refills      Please call Caren Griffins at [facility]([phone]) when you have called this in please.

## 2018-12-07 NOTE — Telephone Encounter (Signed)
Please update pt's pharmacy to [facility] and send prescriptions for omeprazole and famotidine.

## 2018-12-07 NOTE — Telephone Encounter (Signed)
Already refilled today

## 2018-12-07 NOTE — Telephone Encounter (Signed)
New message    *STAT* If patient is at the pharmacy, call can be transferred to refill team.   1. Which medications need to be refilled? (please list name of each medication and dose if known)   metoprolol succinate (TOPROL-XL) 50 MG 24 hr tablet losartan (COZAAR) 25 MG tablet      2. Which pharmacy/location (including street and city if local pharmacy) is medication to be sent to?[facility] - [place], [place] - [place] [address]  3. Do they need a 30 day or 90 day supply?[place]

## 2018-12-07 NOTE — Telephone Encounter (Signed)
Rx(s) sent to pharmacy electronically.  

## 2018-12-07 NOTE — Telephone Encounter (Signed)
Caren Griffins from [facility] called requesting a call back after medications are sent to new pharmacy. Her phone # is [phone].

## 2018-12-09 MED ORDER — FAMOTIDINE 20 MG PO TABS: 20.0000 mg | ORAL_TABLET | Freq: Every day | ORAL | 11 refills | Status: DC

## 2018-12-09 MED ORDER — OMEPRAZOLE 40 MG PO CPDR: 40.0000 mg | DELAYED_RELEASE_CAPSULE | Freq: Two times a day (BID) | ORAL | 1 refills | Status: DC

## 2018-12-09 NOTE — Telephone Encounter (Signed)
Sent Pepcid and Omeprazole to new pharmacy.  Called Caren Griffins and [place] to let her know I had done so.

## 2018-12-12 DIAGNOSIS — I10 Essential (primary) hypertension: Secondary | ICD-10-CM | POA: Diagnosis not present

## 2018-12-14 DIAGNOSIS — G8929 Other chronic pain: Secondary | ICD-10-CM | POA: Diagnosis not present

## 2018-12-14 DIAGNOSIS — M542 Cervicalgia: Secondary | ICD-10-CM | POA: Diagnosis not present

## 2018-12-14 DIAGNOSIS — R531 Weakness: Secondary | ICD-10-CM | POA: Diagnosis not present

## 2018-12-14 DIAGNOSIS — R202 Paresthesia of skin: Secondary | ICD-10-CM | POA: Diagnosis not present

## 2018-12-31 DIAGNOSIS — G9600 Cerebrospinal fluid leak, unspecified: Secondary | ICD-10-CM | POA: Diagnosis not present

## 2019-01-06 DIAGNOSIS — E063 Autoimmune thyroiditis: Secondary | ICD-10-CM | POA: Diagnosis not present

## 2019-01-06 DIAGNOSIS — M539 Dorsopathy, unspecified: Secondary | ICD-10-CM | POA: Diagnosis not present

## 2019-01-06 DIAGNOSIS — G894 Chronic pain syndrome: Secondary | ICD-10-CM | POA: Diagnosis not present

## 2019-01-06 DIAGNOSIS — N3090 Cystitis, unspecified without hematuria: Secondary | ICD-10-CM | POA: Diagnosis not present

## 2019-01-06 DIAGNOSIS — D680 Von Willebrand's disease: Secondary | ICD-10-CM | POA: Diagnosis not present

## 2019-01-11 DIAGNOSIS — Q7962 Hypermobile Ehlers-Danlos syndrome: Secondary | ICD-10-CM | POA: Diagnosis not present

## 2019-01-11 DIAGNOSIS — I10 Essential (primary) hypertension: Secondary | ICD-10-CM | POA: Diagnosis not present

## 2019-01-20 ENCOUNTER — Telehealth: Payer: Self-pay

## 2019-01-20 MED ORDER — FAMOTIDINE 20 MG PO TABS: 20.0000 mg | ORAL_TABLET | Freq: Every day | ORAL | 11 refills | Status: DC

## 2019-01-20 NOTE — Telephone Encounter (Signed)
Resent Pepcid to [facility] per request of Caren Griffins at [facility]

## 2019-01-26 DIAGNOSIS — K219 Gastro-esophageal reflux disease without esophagitis: Secondary | ICD-10-CM | POA: Diagnosis not present

## 2019-01-26 DIAGNOSIS — I10 Essential (primary) hypertension: Secondary | ICD-10-CM | POA: Diagnosis not present

## 2019-01-26 DIAGNOSIS — Q796 Ehlers-Danlos syndrome, unspecified: Secondary | ICD-10-CM | POA: Diagnosis not present

## 2019-01-26 DIAGNOSIS — M542 Cervicalgia: Secondary | ICD-10-CM | POA: Diagnosis not present

## 2019-01-26 DIAGNOSIS — M4324 Fusion of spine, thoracic region: Secondary | ICD-10-CM | POA: Diagnosis not present

## 2019-01-26 DIAGNOSIS — M4322 Fusion of spine, cervical region: Secondary | ICD-10-CM | POA: Diagnosis not present

## 2019-01-26 DIAGNOSIS — R519 Headache, unspecified: Secondary | ICD-10-CM | POA: Diagnosis not present

## 2019-01-26 DIAGNOSIS — G8929 Other chronic pain: Secondary | ICD-10-CM | POA: Diagnosis not present

## 2019-01-26 DIAGNOSIS — Z01818 Encounter for other preprocedural examination: Secondary | ICD-10-CM | POA: Diagnosis not present

## 2019-01-26 DIAGNOSIS — G9600 Cerebrospinal fluid leak, unspecified: Secondary | ICD-10-CM | POA: Diagnosis not present

## 2019-01-29 DIAGNOSIS — G9600 Cerebrospinal fluid leak, unspecified: Secondary | ICD-10-CM | POA: Diagnosis not present

## 2019-02-11 DIAGNOSIS — G894 Chronic pain syndrome: Secondary | ICD-10-CM | POA: Diagnosis not present

## 2019-02-11 DIAGNOSIS — I10 Essential (primary) hypertension: Secondary | ICD-10-CM | POA: Diagnosis not present

## 2019-02-16 DIAGNOSIS — M545 Low back pain: Secondary | ICD-10-CM | POA: Diagnosis not present

## 2019-02-17 DIAGNOSIS — R0602 Shortness of breath: Secondary | ICD-10-CM | POA: Diagnosis not present

## 2019-02-17 DIAGNOSIS — R0789 Other chest pain: Secondary | ICD-10-CM | POA: Diagnosis not present

## 2019-02-18 ENCOUNTER — Other Ambulatory Visit: Payer: Self-pay | Admitting: Cardiovascular Disease

## 2019-02-22 DIAGNOSIS — J019 Acute sinusitis, unspecified: Secondary | ICD-10-CM | POA: Diagnosis not present

## 2019-02-26 ENCOUNTER — Other Ambulatory Visit: Payer: Self-pay

## 2019-02-26 ENCOUNTER — Ambulatory Visit: Payer: Medicare Other | Attending: Internal Medicine

## 2019-02-26 DIAGNOSIS — Z20822 Contact with and (suspected) exposure to covid-19: Secondary | ICD-10-CM

## 2019-02-27 LAB — NOVEL CORONAVIRUS, NAA: SARS-CoV-2, NAA: NOT DETECTED

## 2019-03-03 DIAGNOSIS — G894 Chronic pain syndrome: Secondary | ICD-10-CM | POA: Diagnosis not present

## 2019-03-03 DIAGNOSIS — I10 Essential (primary) hypertension: Secondary | ICD-10-CM | POA: Diagnosis not present

## 2019-03-03 DIAGNOSIS — Z0001 Encounter for general adult medical examination with abnormal findings: Secondary | ICD-10-CM | POA: Diagnosis not present

## 2019-03-03 DIAGNOSIS — K219 Gastro-esophageal reflux disease without esophagitis: Secondary | ICD-10-CM | POA: Diagnosis not present

## 2019-03-03 DIAGNOSIS — J329 Chronic sinusitis, unspecified: Secondary | ICD-10-CM | POA: Diagnosis not present

## 2019-03-06 DIAGNOSIS — M4802 Spinal stenosis, cervical region: Secondary | ICD-10-CM | POA: Diagnosis not present

## 2019-03-06 DIAGNOSIS — R531 Weakness: Secondary | ICD-10-CM | POA: Diagnosis not present

## 2019-03-14 DIAGNOSIS — G894 Chronic pain syndrome: Secondary | ICD-10-CM | POA: Diagnosis not present

## 2019-03-14 DIAGNOSIS — I10 Essential (primary) hypertension: Secondary | ICD-10-CM | POA: Diagnosis not present

## 2019-03-14 DIAGNOSIS — Q7962 Hypermobile Ehlers-Danlos syndrome: Secondary | ICD-10-CM | POA: Diagnosis not present

## 2019-03-15 DIAGNOSIS — G9600 Cerebrospinal fluid leak, unspecified: Secondary | ICD-10-CM | POA: Diagnosis not present

## 2019-03-15 DIAGNOSIS — Z87442 Personal history of urinary calculi: Secondary | ICD-10-CM | POA: Diagnosis not present

## 2019-03-15 DIAGNOSIS — Z9889 Other specified postprocedural states: Secondary | ICD-10-CM | POA: Diagnosis not present

## 2019-04-08 DIAGNOSIS — R519 Headache, unspecified: Secondary | ICD-10-CM | POA: Diagnosis not present

## 2019-04-08 DIAGNOSIS — G9600 Cerebrospinal fluid leak, unspecified: Secondary | ICD-10-CM | POA: Diagnosis not present

## 2019-04-11 DIAGNOSIS — Q7962 Hypermobile Ehlers-Danlos syndrome: Secondary | ICD-10-CM | POA: Diagnosis not present

## 2019-04-11 DIAGNOSIS — I10 Essential (primary) hypertension: Secondary | ICD-10-CM | POA: Diagnosis not present

## 2019-04-11 DIAGNOSIS — G894 Chronic pain syndrome: Secondary | ICD-10-CM | POA: Diagnosis not present

## 2019-04-19 IMAGING — DX DG ABDOMEN 1V
1 series · 1 of 1 positions shown · non-contrast
Comparison: 03/18/2018 and CT, 03/11/2018.

CLINICAL DATA: Preop for left-sided kidney stone.

EXAM:
ABDOMEN - 1 VIEW

[abdomen kub]
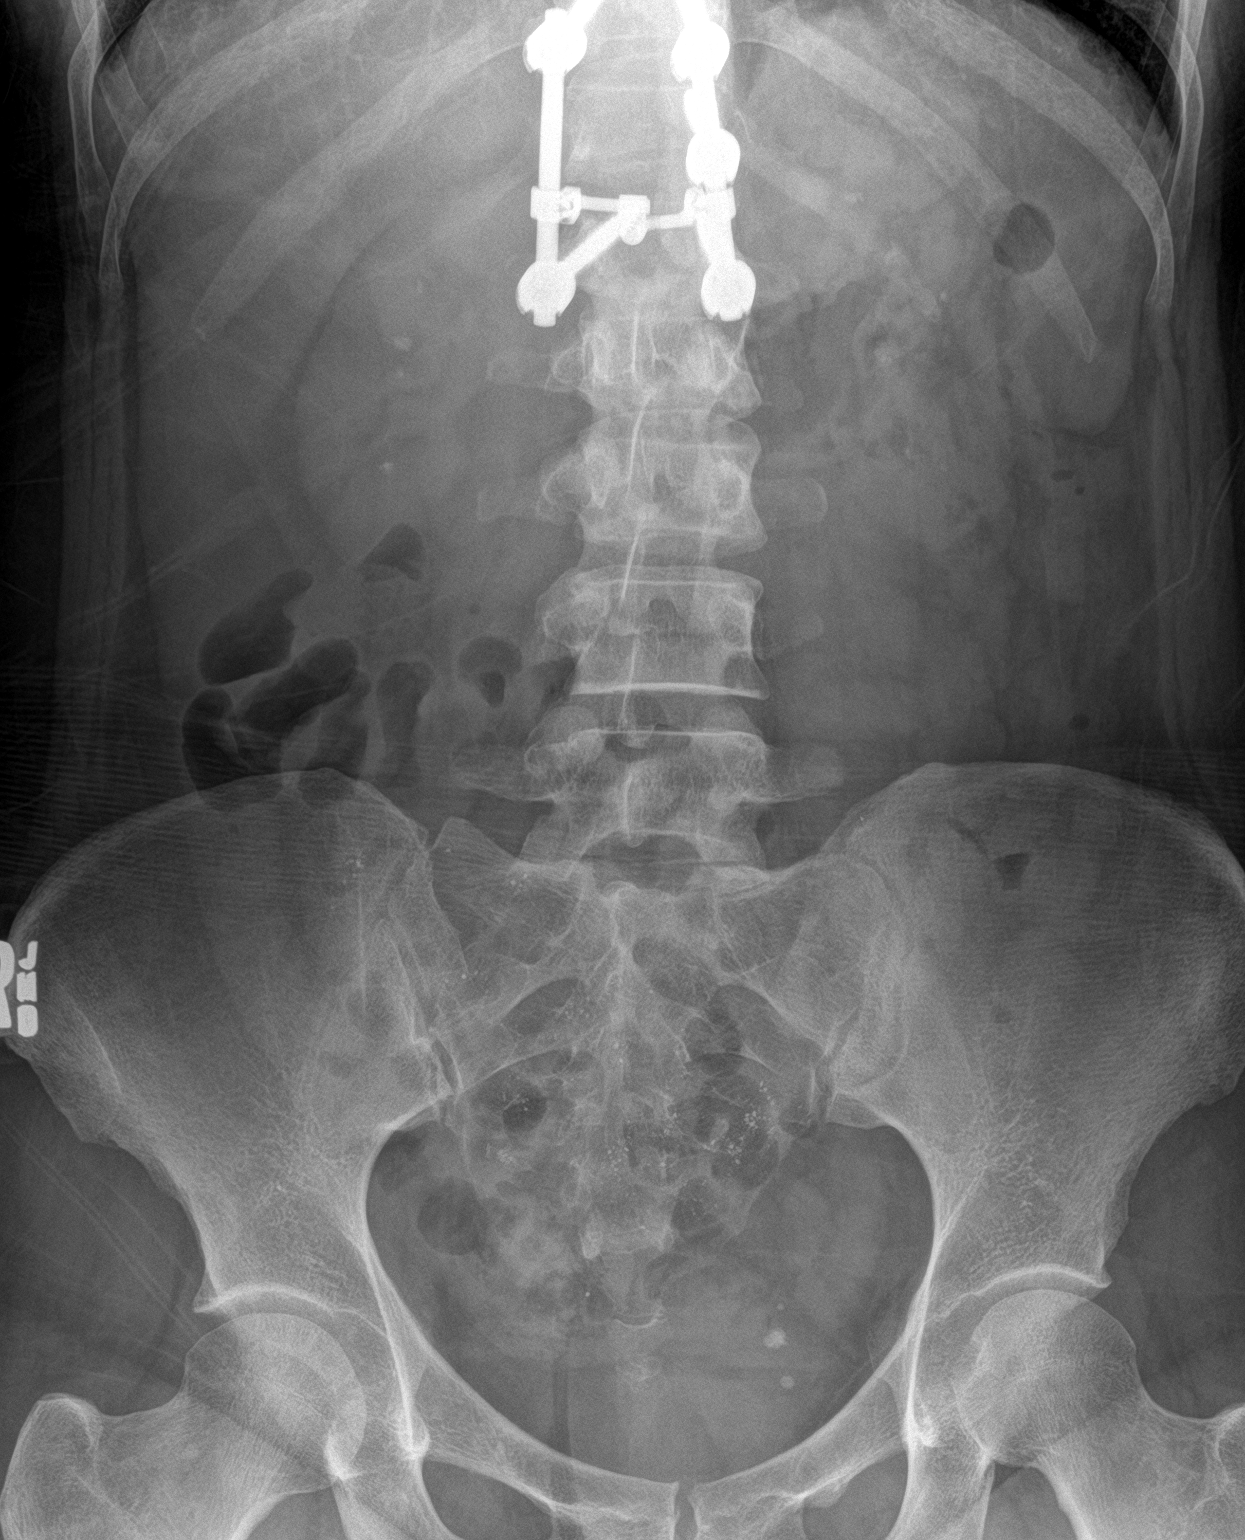

[1 of 1 positions shown; findings below may reference images not displayed]

FINDINGS: 5 mm stone in the left pelvis is consistent with the distal ureteral
calculus noted on the prior CT. No other evidence of a ureteral
stone.

Bilateral intrarenal stones are stable from the prior CT and prior
radiograph.

Normal bowel gas pattern. Small dense foci are noted within the
stool in the sigmoid colon.

Status post thoracolumbar posterior fusion, stable.
IMPRESSION: 1. Persistent calculus in the distal left ureter stable.
2. Bilateral intrarenal stones. Findings are stable from the prior
radiographs and prior CT.

## 2019-04-30 DIAGNOSIS — G5641 Causalgia of right upper limb: Secondary | ICD-10-CM | POA: Diagnosis not present

## 2019-05-12 DIAGNOSIS — Q7962 Hypermobile Ehlers-Danlos syndrome: Secondary | ICD-10-CM | POA: Diagnosis not present

## 2019-05-12 DIAGNOSIS — G894 Chronic pain syndrome: Secondary | ICD-10-CM | POA: Diagnosis not present

## 2019-05-12 DIAGNOSIS — I10 Essential (primary) hypertension: Secondary | ICD-10-CM | POA: Diagnosis not present

## 2019-05-24 DIAGNOSIS — G9600 Cerebrospinal fluid leak, unspecified: Secondary | ICD-10-CM | POA: Diagnosis not present

## 2019-05-24 DIAGNOSIS — G932 Benign intracranial hypertension: Secondary | ICD-10-CM | POA: Diagnosis not present

## 2019-05-24 DIAGNOSIS — R519 Headache, unspecified: Secondary | ICD-10-CM | POA: Diagnosis not present

## 2019-05-24 DIAGNOSIS — M4324 Fusion of spine, thoracic region: Secondary | ICD-10-CM | POA: Diagnosis not present

## 2019-05-24 DIAGNOSIS — M4322 Fusion of spine, cervical region: Secondary | ICD-10-CM | POA: Diagnosis not present

## 2019-05-25 DIAGNOSIS — H5713 Ocular pain, bilateral: Secondary | ICD-10-CM | POA: Diagnosis not present

## 2019-06-01 ENCOUNTER — Other Ambulatory Visit: Payer: Self-pay | Admitting: Internal Medicine

## 2019-06-01 MED ORDER — OMEPRAZOLE 40 MG PO CPDR: 40.0000 mg | DELAYED_RELEASE_CAPSULE | Freq: Two times a day (BID) | ORAL | 0 refills | Status: DC

## 2019-06-01 NOTE — Telephone Encounter (Signed)
Prescription sent to patient's pharmacy and informed patient to schedule follow up visit for further refills.

## 2019-06-11 DIAGNOSIS — I10 Essential (primary) hypertension: Secondary | ICD-10-CM | POA: Diagnosis not present

## 2019-06-11 DIAGNOSIS — Q7962 Hypermobile Ehlers-Danlos syndrome: Secondary | ICD-10-CM | POA: Diagnosis not present

## 2019-06-11 DIAGNOSIS — G894 Chronic pain syndrome: Secondary | ICD-10-CM | POA: Diagnosis not present

## 2019-07-07 DIAGNOSIS — L0292 Furuncle, unspecified: Secondary | ICD-10-CM | POA: Diagnosis not present

## 2019-07-07 DIAGNOSIS — R809 Proteinuria, unspecified: Secondary | ICD-10-CM | POA: Diagnosis not present

## 2019-07-07 DIAGNOSIS — A4901 Methicillin susceptible Staphylococcus aureus infection, unspecified site: Secondary | ICD-10-CM | POA: Diagnosis not present

## 2019-07-07 DIAGNOSIS — M899 Disorder of bone, unspecified: Secondary | ICD-10-CM | POA: Diagnosis not present

## 2019-07-07 DIAGNOSIS — G932 Benign intracranial hypertension: Secondary | ICD-10-CM | POA: Diagnosis not present

## 2019-07-07 DIAGNOSIS — L0390 Cellulitis, unspecified: Secondary | ICD-10-CM | POA: Diagnosis not present

## 2019-07-07 DIAGNOSIS — T50905A Adverse effect of unspecified drugs, medicaments and biological substances, initial encounter: Secondary | ICD-10-CM | POA: Diagnosis not present

## 2019-07-07 DIAGNOSIS — R519 Headache, unspecified: Secondary | ICD-10-CM | POA: Diagnosis not present

## 2019-07-12 DIAGNOSIS — Q7962 Hypermobile Ehlers-Danlos syndrome: Secondary | ICD-10-CM | POA: Diagnosis not present

## 2019-07-12 DIAGNOSIS — I10 Essential (primary) hypertension: Secondary | ICD-10-CM | POA: Diagnosis not present

## 2019-07-12 DIAGNOSIS — G894 Chronic pain syndrome: Secondary | ICD-10-CM | POA: Diagnosis not present

## 2019-07-29 DIAGNOSIS — G44001 Cluster headache syndrome, unspecified, intractable: Secondary | ICD-10-CM | POA: Diagnosis not present

## 2019-07-29 DIAGNOSIS — G932 Benign intracranial hypertension: Secondary | ICD-10-CM | POA: Diagnosis not present

## 2019-07-29 DIAGNOSIS — Z79899 Other long term (current) drug therapy: Secondary | ICD-10-CM | POA: Diagnosis not present

## 2019-07-29 DIAGNOSIS — I10 Essential (primary) hypertension: Secondary | ICD-10-CM | POA: Diagnosis not present

## 2019-08-06 DIAGNOSIS — M4324 Fusion of spine, thoracic region: Secondary | ICD-10-CM | POA: Diagnosis not present

## 2019-08-06 DIAGNOSIS — M546 Pain in thoracic spine: Secondary | ICD-10-CM | POA: Diagnosis not present

## 2019-08-06 DIAGNOSIS — G9600 Cerebrospinal fluid leak, unspecified: Secondary | ICD-10-CM | POA: Diagnosis not present

## 2019-08-11 DIAGNOSIS — I10 Essential (primary) hypertension: Secondary | ICD-10-CM | POA: Diagnosis not present

## 2019-08-11 DIAGNOSIS — Q7962 Hypermobile Ehlers-Danlos syndrome: Secondary | ICD-10-CM | POA: Diagnosis not present

## 2019-08-11 DIAGNOSIS — G894 Chronic pain syndrome: Secondary | ICD-10-CM | POA: Diagnosis not present

## 2019-08-16 ENCOUNTER — Ambulatory Visit (HOSPITAL_COMMUNITY)
Admission: EM | Admit: 2019-08-16 | Discharge: 2019-08-16 | Disposition: A | Discharge status: Home / Self Care | Payer: Medicare Other | Attending: Family Medicine | Admitting: Family Medicine

## 2019-08-16 ENCOUNTER — Other Ambulatory Visit: Payer: Self-pay

## 2019-08-16 ENCOUNTER — Encounter (HOSPITAL_COMMUNITY): Payer: Self-pay | Admitting: Emergency Medicine

## 2019-08-16 DIAGNOSIS — G8929 Other chronic pain: Secondary | ICD-10-CM | POA: Diagnosis not present

## 2019-08-16 DIAGNOSIS — R519 Headache, unspecified: Secondary | ICD-10-CM | POA: Diagnosis not present

## 2019-08-16 DIAGNOSIS — H9202 Otalgia, left ear: Secondary | ICD-10-CM | POA: Diagnosis not present

## 2019-08-16 DIAGNOSIS — G932 Benign intracranial hypertension: Secondary | ICD-10-CM | POA: Diagnosis not present

## 2019-08-16 MED ORDER — ONDANSETRON 8 MG PO TBDP: 8.0000 mg | ORAL_TABLET | Freq: Three times a day (TID) | ORAL | 0 refills | Status: DC | PRN

## 2019-08-16 MED ORDER — ONDANSETRON 4 MG PO TBDP
4.0000 mg | ORAL_TABLET | Freq: Once | ORAL | Status: AC
  Administered 2019-08-16: 4 mg via ORAL

## 2019-08-16 MED ORDER — ONDANSETRON 4 MG PO TBDP
ORAL_TABLET | ORAL | Status: AC
  Filled 2019-08-16: qty 1

## 2019-08-16 NOTE — Discharge Instructions (Addendum)
I spoke with Dr Constance Holster from ENT and he recommends you be evaluated in the ER with possible CT scanning

## 2019-08-16 NOTE — ED Provider Notes (Signed)
[place]    CSN: [id number] Arrival date & time: 08/16/19  1405      History   Chief Complaint Chief Complaint  Patient presents with  . Otalgia    HPI Lorraine Weber is a 39 y.o. female.   HPI  This is a 39 year old woman with a very complicated past medical history She has von Willebrand's disease She has Ehlers-Danlos syndrome She has idiopathic intracranial hypertension with cluster headaches, migraine headaches She has frequent sinus infections treated with Flonase She states that she has had multiple episodes of mastoiditis.  Sometimes she is hospitalized, sometimes she is treated with oral antibiotics, per patient She is under the care of a primary care doctor in [place], [employer], MD, and goes to the [facility] physicians for specialty medical care including neurology, orthopedics/neurosurgery  She is here today with a complaint of nausea and vomiting.  She is having severe face pain and headache.  She states she called her neurologist and was told it was likely due to her increased intracranial pressure.  Was told to increase her Topamax.  She thought it might be her sinuses.  She states that she has nausea and vomiting when she has pain but also when she is coming down with an infection.  She states that for the last couple of days she has been having increased pain in her left ear and this morning noticed swelling behind her ear and pain in the outer ear.  She recognizes this as mastoiditis.  She is here hoping for treatment.  She states she has been seen by the [facility] group in the past.  She states it has been some years.  Past Medical History:  Diagnosis Date  . Anemia   . Clotting disorder ([place])    Von Willebrand   . Concussion    x8, last 2010, residual homonymous Hemianopsia  . CSF leak    multiple serum pathes  . Frequent sinus infections    tx with flonase nasal spray  . GERD (gastroesophageal reflux disease)   . History of blood  transfusion    over 5 yrs ago  . Hypertension   . Mastoiditis   . Migraines   . Nephrolithiasis    X5, Dr Jeffie Pollock, passed stones, no surgery  . Peptic ulcer disease   . Von Willebrand disease ([place])    mild form per patient    Patient Active Problem List   Diagnosis Date Noted  . Encounter for therapeutic drug monitoring 09/28/2014  . CSF leak 05/10/2014  . Back ache 08/12/2013  . Cephalalgia 08/12/2013  . Cervical pain 08/12/2013  . Complicated migraine [id number]/[id number]  . Hemiplegia, unspecified, affecting nondominant side 01/02/2013  . Sebaceous cyst of right axilla 07/02/2012  . Atypical face pain 04/30/2012  . Dysfunction of eustachian tube 04/30/2012  . Arthralgia of temporomandibular joint 04/30/2012  . Chronic sinusitis 10/01/2011  . Candida vaginitis 08/22/2010  . Preseptal cellulitis 08/14/2010  . MRSA bacteremia 08/03/2010  . GERD 04/02/2010  . ANEMIA-NOS 11/08/2009  . Von Willebrand's disease ([place]) 11/08/2009  . PEPTIC ULCER DISEASE 11/08/2009  . Headache, post-traumatic 11/08/2009  . NEPHROLITHIASIS, HX OF 11/08/2009    Past Surgical History:  Procedure Laterality Date  . APPENDECTOMY    . CERVICAL FUSION  2018   C5-C7  . DILITATION & CURRETTAGE/HYSTROSCOPY WITH NOVASURE ABLATION N/A 08/23/2015   Procedure: DILATATION & CURETTAGE/HYSTEROSCOPY WITH NOVASURE ABLATION;  Surgeon: Vanessa Kick, MD;  Location: [facility];  Service: Gynecology;  Laterality:  N/A;  . EXTRACORPOREAL SHOCK WAVE LITHOTRIPSY Left 03/19/2018   Procedure: EXTRACORPOREAL SHOCK WAVE LITHOTRIPSY (ESWL);  Surgeon: Bjorn Loser, MD;  Location: [facility];  Service: Urology;  Laterality: Left;  . HAND TENDON SURGERY Left    left, complicated by MRSA  . LAMINECTOMY  21016   T10-T12  . spinal fluid leak patching     > 10X;[facility] - blood patching  . SPINAL FUSION  2017   T10-T11  . SPINAL FUSION  2018   T10-L1  . WISDOM TOOTH EXTRACTION      OB History   No obstetric history on file.      Home  Medications    Prior to Admission medications   Medication Sig Start Date End Date Taking? Authorizing Provider  Docusate Calcium (STOOL SOFTENER PO) Take 1 tablet by mouth daily.   Yes [provider]  famotidine (PEPCID) 20 MG tablet Take 1 tablet (20 mg total) by mouth at bedtime. 01/20/19  Yes Irene Shipper, MD  fluticasone [facility]) 50 MCG/ACT nasal spray Place 1 spray into both nostrils daily.   Yes [provider]  gabapentin (NEURONTIN) 300 MG capsule Take 1 capsule by mouth 3 (three) times daily.   Yes [provider]  losartan (COZAAR) 25 MG tablet Take 1 tablet (25 mg total) by mouth daily. 12/07/18 08/16/19 Yes Croitoru, Mihai, MD  methocarbamol (ROBAXIN) 750 MG tablet Take 750 mg by mouth 4 (four) times daily. 08/09/19  Yes [provider]  metoprolol succinate (TOPROL-XL) 50 MG 24 hr tablet TAKE 1 TABLET BY MOUTH EVERY DAY 02/19/19  Yes Croitoru, Mihai, MD  omeprazole (PRILOSEC) 40 MG capsule Take 1 capsule (40 mg total) by mouth 2 (two) times daily. 06/01/19  Yes Irene Shipper, MD  ondansetron (ZOFRAN) 4 MG tablet TAKE 1 TABLET EVERY 8 HOURS AS NEEDED FOR NAUSEA 01/08/17  Yes [provider]  tiZANidine (ZANAFLEX) 2 MG tablet Take 1-2 tablets by mouth every 8 (eight) hours as needed. 11/25/17  Yes [provider]  traMADol (ULTRAM) 50 MG tablet TAKE 1 TO 2 TABLETS 4 TIMES A DAY AS NEEDED 11/25/17  Yes [provider]  nortriptyline (PAMELOR) 50 MG capsule Take 50 mg by mouth 2 (two) times daily. 08/09/19   [provider]  ondansetron (ZOFRAN ODT) 8 MG disintegrating tablet Take 1 tablet (8 mg total) by mouth every 8 (eight) hours as needed for nausea or vomiting. 08/16/19   Raylene Everts, MD  spironolactone (ALDACTONE) 25 MG tablet Take 25 mg by mouth 2 (two) times daily. 08/09/19   [provider]  levonorgestrel-ethinyl estradiol (ALTAVERA) 0.15-30 MG-MCG per tablet Take 1 tablet by mouth daily.    04/29/11   [provider]    Family History Family History  Problem Relation Age of Onset  . Arthritis Father   . Hyperlipidemia Father   . Diabetes Father   . Stroke Father        PTE post CVA  . Kidney disease Father   . Prostate cancer Paternal Grandfather   . Hyperlipidemia Paternal Grandfather   . Diabetes Paternal Grandfather   . Heart disease Mother        ?hypertrophic idiopathic subaortic stenosis  . Heart disease Maternal Grandmother   . Diabetes Maternal Grandmother   . Heart disease Maternal Grandfather        HISS  . Heart disease Maternal Aunt        HISS  . Stroke Maternal Aunt 50  .  Heart disease Maternal Uncle   . Heart disease Brother        HISS  . Other Other        EDS in M, bro & niece  . Stomach cancer Neg Hx   . Colon cancer Neg Hx     Social History Social History   Tobacco Use  . Smoking status: Former Smoker    Packs/day: 0.30    Years: 10.00    Pack years: 3.00    Types: Cigarettes  . Smokeless tobacco: Never Used  . Tobacco comment: smoked age 49-present, up to 1 ppd.05/24/14 1/2 ppd  Vaping Use  . Vaping Use: Never used  Substance Use Topics  . Alcohol use: No    Alcohol/week: 0.0 standard drinks  . Drug use: No     Allergies   Acetazolamide, Aspirin, Other, Topamax [topiramate], Levofloxacin, Levofloxacin in d5w, and Sulfamethoxazole-trimethoprim   Review of Systems Review of Systems  See HPI Physical Exam Triage Vital Signs ED Triage Vitals  Enc Vitals Group     BP 08/16/19 1458 123/66     Pulse Rate 08/16/19 1458 83     Resp 08/16/19 1458 16     Temp 08/16/19 1458 97.9 F (36.6 C)     Temp Source 08/16/19 1458 Oral     SpO2 08/16/19 1458 100 %     Weight --      Height --      Head Circumference --      Peak Flow --      Pain Score 08/16/19 1451 6     Pain Loc --      Pain Edu? --      Excl. in [place]? --    No data found.  Updated Vital Signs BP 123/66 (BP Location: Left Arm)   Pulse 83   Temp 97.9 F  (36.6 C) (Oral)   Resp 16   SpO2 100%      Physical Exam Constitutional:      General: She is not in acute distress.    Appearance: She is well-developed and normal weight.  HENT:     Head: Normocephalic and atraumatic.     Comments: Mild face asymmetry    Right Ear: Tympanic membrane, ear canal and external ear normal.     Left Ear: Tympanic membrane, ear canal and external ear normal.     Nose: Nose normal. No congestion.     Comments: Tenderness over the ethmoid, frontal, and maxillary sinus as well as the left mastoid region.  Faint erythema over left mastoid region.  Mild pain with retraction of left pinna    Mouth/Throat:     Mouth: Mucous membranes are moist.     Pharynx: No posterior oropharyngeal erythema.  Eyes:     Conjunctiva/sclera: Conjunctivae normal.     Pupils: Pupils are equal, round, and reactive to light.  Cardiovascular:     Rate and Rhythm: Normal rate.  Pulmonary:     Effort: Pulmonary effort is normal. No respiratory distress.  Musculoskeletal:        General: Normal range of motion.     Cervical back: Normal range of motion.  Skin:    General: Skin is warm and dry.  Neurological:     Mental Status: She is alert.  Psychiatric:        Mood and Affect: Mood normal.        Behavior: Behavior normal.      UC Treatments / Results  Labs (  all labs ordered are listed, but only abnormal results are displayed) Labs Reviewed - No data to display  EKG   Radiology No results found.  Procedures Procedures (including critical care time)  Medications Ordered in UC Medications  ondansetron (ZOFRAN-ODT) disintegrating tablet 4 mg (4 mg Oral Given 08/16/19 1603)    Initial Impression / Assessment and Plan / UC Course  I have reviewed the triage vital signs and the nursing notes.  Pertinent labs & imaging results that were available during my care of the patient were reviewed by me and considered in my medical decision making (see chart for  details).     *Discussed with Dr. Constance Holster of [facility].  He states that mastoiditis is unlikely if the TM is clear.  Patient has multiple sinus tenderness, with multiple medical problems that may contribute.  She is advised to go the emergency room for CAT scan.  She states that she will discuss with her husband, and possibly go to [facility] tomorrow for a CAT scan if it is indicated Final Clinical Impressions(s) / UC Diagnoses   Final diagnoses:  Acute ear pain, left  Head pain, chronic  IIH (idiopathic intracranial hypertension)     Discharge Instructions     I spoke with Dr Constance Holster from ENT and he recommends you be evaluated in the ER with possible CT scanning    ED Prescriptions    Medication Sig Dispense Auth. Provider   ondansetron (ZOFRAN ODT) 8 MG disintegrating tablet Take 1 tablet (8 mg total) by mouth every 8 (eight) hours as needed for nausea or vomiting. 20 tablet Raylene Everts, MD     PDMP not reviewed this encounter.   Raylene Everts, MD 08/16/19 1640

## 2019-08-16 NOTE — ED Triage Notes (Signed)
Pt c/o of mastoiditis, sts that she has had this happened since she was a little girl. On set involves upset stomach and vomiting since Thursday. Pt sts she has a hx of CSF pressure in head.

## 2019-08-19 DIAGNOSIS — Z6824 Body mass index (BMI) 24.0-24.9, adult: Secondary | ICD-10-CM | POA: Diagnosis not present

## 2019-08-19 DIAGNOSIS — R591 Generalized enlarged lymph nodes: Secondary | ICD-10-CM | POA: Diagnosis not present

## 2019-08-19 DIAGNOSIS — E039 Hypothyroidism, unspecified: Secondary | ICD-10-CM | POA: Diagnosis not present

## 2019-09-03 ENCOUNTER — Ambulatory Visit: Payer: Medicare Other | Admitting: Cardiovascular Disease

## 2019-09-03 ENCOUNTER — Encounter: Payer: Self-pay | Admitting: Cardiovascular Disease

## 2019-09-03 ENCOUNTER — Other Ambulatory Visit: Payer: Self-pay

## 2019-09-03 VITALS — BP 111/64 | HR 77 | Ht 65.0 in | Wt 140.8 lb

## 2019-09-03 DIAGNOSIS — Z8249 Family history of ischemic heart disease and other diseases of the circulatory system: Secondary | ICD-10-CM | POA: Diagnosis not present

## 2019-09-03 DIAGNOSIS — I10 Essential (primary) hypertension: Secondary | ICD-10-CM

## 2019-09-03 DIAGNOSIS — G932 Benign intracranial hypertension: Secondary | ICD-10-CM | POA: Diagnosis not present

## 2019-09-03 DIAGNOSIS — I341 Nonrheumatic mitral (valve) prolapse: Secondary | ICD-10-CM

## 2019-09-03 DIAGNOSIS — Q796 Ehlers-Danlos syndrome, unspecified: Secondary | ICD-10-CM | POA: Diagnosis not present

## 2019-09-03 NOTE — Patient Instructions (Signed)
Medication Instructions:  No Changes *If you need a refill on your cardiac medications before your next appointment, please call your pharmacy*   Lab Work: None Ordered If you have labs (blood work) drawn today and your tests are completely normal, you will receive your results only by: . MyChart Message (if you have MyChart) OR . A paper copy in the mail If you have any lab test that is abnormal or we need to change your treatment, we will call you to review the results.   Testing/Procedures: None Ordered   Follow-Up: At [facility], you and your health needs are our priority.  As part of our continuing mission to provide you with exceptional heart care, we have created designated Provider Care Teams.  These Care Teams include your primary Cardiologist (physician) and Advanced Practice Providers (APPs -  Physician Assistants and Nurse Practitioners) who all work together to provide you with the care you need, when you need it.  We recommend signing up for the patient portal called "MyChart".  Sign up information is provided on this After Visit Summary.  MyChart is used to connect with patients for Virtual Visits (Telemedicine).  Patients are able to view lab/test results, encounter notes, upcoming appointments, etc.  Non-urgent messages can be sent to your provider as well.   To learn more about what you can do with MyChart, go to [email or web address].    Your next appointment:   12 month(s)  The format for your next appointment:   In Person  Provider:   You may see Dr. Croitoru or one of the following Advanced Practice Providers on your designated Care Team:    Hao Meng, PA-C  Angela Duke, PA-C or   Krista Kroeger, PA-C     

## 2019-09-03 NOTE — Progress Notes (Signed)
Cardiology Office Note    Date:  09/05/2019   ID:  Lorraine Weber, DOB 1980-03-18, MRN [id number]  PCP:  Redmond School, MD  Cardiologist:   Sanda Klein, MD   Chief Complaint  Patient presents with  . Follow-up    Ehlers- Danlos sd    History of Present Illness:  Lorraine Weber is a 39 y.o. female with a family history of Ehlers-Danlos syndrome with vascular manifestations and personal history of joint dislocations and skin hyperextensibility here for evaluation of possible vascular complications of EDS. Her brother had an aortic dissection around age 47 and had to undergo emergency surgery. Reportedly he has undergone genetic testing and his 2 daughters have also been confirmed to have the disorder, but he is not willing to share the results of the genetic testing with the rest of his family.  She is planning to get her own genetic testing and is working out a Theme park manager for insurance to cover it.  She has undergone an echocardiogram which does not show evidence of mitral valve prolapse and a CT angiogram of the aorta does not show evidence of dissection or aneurysm.  However, her physical exam shows a very distinct and loud midsystolic click consistent with mitral valve prolapse.  Overall she is doing much better from a neurological point of view, after struggling for years with issues related to her CSF leak and increased intracranial pressure.  She will probably eventually have a shunt placed, which may be a safer approach than a stent in her case.  The patient specifically denies any chest pain at rest exertion, dyspnea at rest or with exertion, orthopnea, paroxysmal nocturnal dyspnea, syncope, palpitations, focal neurological deficits, intermittent claudication, lower extremity edema, unexplained weight gain, cough, hemoptysis or wheezing.  She has lost substantial weight and this has helped with blood pressure control.  She has well-controlled systemic hypertension (onset in her  late teens), a "mild form" of von Willebrand's disease and idiopathic peripheral neuropathy.  She takes spironolactone for her increased intracranial pressure and is on a beta-blocker and angiotensin receptor blocker for prevention of aneurysm formation.  Potassium was normal at 4.4 on 07/07/2019  In addition to her brother with aortic dissection there is a history of hyperlipidemia, hypertension and myocardial infarction in her father who has also had DVT and stroke. She also has a maternal aunt who has hyperlipidemia and coronary artery disease and stroke  Past Medical History:  Diagnosis Date  . Anemia   . Clotting disorder ([place])    Von Willebrand   . Concussion    x8, last 2010, residual homonymous Hemianopsia  . CSF leak    multiple serum pathes  . Frequent sinus infections    tx with flonase nasal spray  . GERD (gastroesophageal reflux disease)   . History of blood transfusion    over 5 yrs ago  . Hypertension   . Mastoiditis   . Migraines   . Nephrolithiasis    X5, Dr Jeffie Pollock, passed stones, no surgery  . Peptic ulcer disease   . Von Willebrand disease ([place])    mild form per patient    Past Surgical History:  Procedure Laterality Date  . APPENDECTOMY    . CERVICAL FUSION  2018   C5-C7  . DILITATION & CURRETTAGE/HYSTROSCOPY WITH NOVASURE ABLATION N/A 08/23/2015   Procedure: DILATATION & CURETTAGE/HYSTEROSCOPY WITH NOVASURE ABLATION;  Surgeon: Vanessa Kick, MD;  Location: [facility];  Service: Gynecology;  Laterality: N/A;  . EXTRACORPOREAL SHOCK WAVE LITHOTRIPSY  Left 03/19/2018   Procedure: EXTRACORPOREAL SHOCK WAVE LITHOTRIPSY (ESWL);  Surgeon: Bjorn Loser, MD;  Location: [facility];  Service: Urology;  Laterality: Left;  . HAND TENDON SURGERY Left    left, complicated by MRSA  . LAMINECTOMY  21016   T10-T12  . spinal fluid leak patching     > 10X;[facility] - blood patching  . SPINAL FUSION  2017   T10-T11  . SPINAL FUSION  2018   T10-L1  . WISDOM TOOTH EXTRACTION       Current Medications: Outpatient Medications Prior to Visit  Medication Sig Dispense Refill  . Docusate Calcium (STOOL SOFTENER PO) Take 1 tablet by mouth daily.    . famotidine (PEPCID) 20 MG tablet Take 1 tablet (20 mg total) by mouth at bedtime. 30 tablet 11  . fluticasone (FLONASE) 50 MCG/ACT nasal spray Place 1 spray into both nostrils daily.    Marland Kitchen gabapentin (NEURONTIN) 300 MG capsule Take 1 capsule by mouth 3 (three) times daily.    Marland Kitchen losartan (COZAAR) 25 MG tablet Take 1 tablet (25 mg total) by mouth daily. 90 tablet 2  . methocarbamol (ROBAXIN) 750 MG tablet Take 750 mg by mouth 4 (four) times daily.    . metoprolol succinate (TOPROL-XL) 50 MG 24 hr tablet TAKE 1 TABLET BY MOUTH EVERY DAY 90 tablet 1  . nortriptyline (PAMELOR) 50 MG capsule Take 50 mg by mouth 2 (two) times daily.    Marland Kitchen omeprazole (PRILOSEC) 40 MG capsule Take 1 capsule (40 mg total) by mouth 2 (two) times daily. 60 capsule 0  . ondansetron (ZOFRAN ODT) 8 MG disintegrating tablet Take 1 tablet (8 mg total) by mouth every 8 (eight) hours as needed for nausea or vomiting. 20 tablet 0  . ondansetron (ZOFRAN) 4 MG tablet TAKE 1 TABLET EVERY 8 HOURS AS NEEDED FOR NAUSEA  0  . spironolactone (ALDACTONE) 25 MG tablet Take 25 mg by mouth 2 (two) times daily.    Marland Kitchen tiZANidine (ZANAFLEX) 2 MG tablet Take 1-2 tablets by mouth every 8 (eight) hours as needed.  1  . traMADol (ULTRAM) 50 MG tablet TAKE 1 TO 2 TABLETS 4 TIMES A DAY AS NEEDED  2   No facility-administered medications prior to visit.     Allergies:   Acetazolamide, Aspirin, Other, Topamax [topiramate], Levofloxacin, Levofloxacin in d5w, and Sulfamethoxazole-trimethoprim   Social History   Socioeconomic History  . Marital status: Married    Spouse name: Not on file  . Number of children: 0  . Years of education: Not on file  . Highest education level: Not on file  Occupational History  . Occupation: [employer]    Employer: [employer]  .  Occupation: disabled  Tobacco Use  . Smoking status: Former Smoker    Packs/day: 0.30    Years: 10.00    Pack years: 3.00    Types: Cigarettes  . Smokeless tobacco: Never Used  . Tobacco comment: smoked age 85-present, up to 1 ppd.05/24/14 1/2 ppd  Vaping Use  . Vaping Use: Never used  Substance and Sexual Activity  . Alcohol use: No    Alcohol/week: 0.0 standard drinks  . Drug use: No  . Sexual activity: Yes    Partners: Male    Birth control/protection: None    Comment: IUD removed 06/2015  Other Topics Concern  . Not on file  Social History Narrative  . Not on file   Social Determinants of Health   Financial Resource Strain:   .  Difficulty of Paying Living Expenses:   Food Insecurity:   . Worried About Charity fundraiser in the Last Year:   . Arboriculturist in the Last Year:   Transportation Needs:   . Film/video editor (Medical):   Marland Kitchen Lack of Transportation (Non-Medical):   Physical Activity:   . Days of Exercise per Week:   . Minutes of Exercise per Session:   Stress:   . Feeling of Stress :   Social Connections:   . Frequency of Communication with Friends and Family:   . Frequency of Social Gatherings with Friends and Family:   . Attends Religious Services:   . Active Member of Clubs or Organizations:   . Attends Archivist Meetings:   Marland Kitchen Marital Status:      Family History:  The patient's family history includes Arthritis in her father; Diabetes in her father, maternal grandmother, and paternal grandfather; Heart disease in her brother, maternal aunt, maternal grandfather, maternal grandmother, maternal uncle, and mother; Hyperlipidemia in her father and paternal grandfather; Kidney disease in her father; Other in an other family member; Prostate cancer in her paternal grandfather; Stroke in her father; Stroke (age of onset: 55) in her maternal aunt.   ROS:   Please see the history of present illness.    ROS All other systems are reviewed and  are negative.  PHYSICAL EXAM:   VS:  BP (!) 111/64   Pulse 77   Ht 5\' 5"  (1.651 m)   Wt 140 lb 12.8 oz (63.9 kg)   SpO2 100%   BMI 23.43 kg/m    General: Alert, oriented x3, no distress Head: no evidence of trauma, PERRL, EOMI, no exophtalmos or lid lag, no myxedema, no xanthelasma; normal ears, nose and oropharynx Neck: normal jugular venous pulsations and no hepatojugular reflux; brisk carotid pulses without delay and no carotid bruits Chest: clear to auscultation, no signs of consolidation by percussion or palpation, normal fremitus, symmetrical and full respiratory excursions Cardiovascular: normal position and quality of the apical impulse, regular rhythm, normal first and second heart sounds, no murmurs, rubs or gallops.  She has a very distinct midsystolic click that intensifies with the Valsalva maneuver, but does not have any murmurs either at rest or with provocative maneuvers. Abdomen: no tenderness or distention, no masses by palpation, no abnormal pulsatility or arterial bruits, normal bowel sounds, no hepatosplenomegaly Extremities: no clubbing, cyanosis or edema; 2+ radial, ulnar and brachial pulses bilaterally; 2+ right femoral, posterior tibial and dorsalis pedis pulses; 2+ left femoral, posterior tibial and dorsalis pedis pulses; no subclavian or femoral bruits Neurological: grossly nonfocal Psych: Normal mood and affect   Wt Readings from Last 3 Encounters:  09/03/19 140 lb 12.8 oz (63.9 kg)  08/25/18 168 lb (76.2 kg)  03/19/18 170 lb (77.1 kg)      Studies/Labs Reviewed:   EKG: Tracing ordered today shows normal sinus rhythm, normal tracing  Recent Labs: No results found for requested labs within last 8760 hours.  07/07/2019 Hemoglobin 14.7, calcium 4.4 Lipid Panel    Component Value Date/Time   CHOL 128 06/28/2016 1531   TRIG 98 06/28/2016 1531   HDL 48 06/28/2016 1531   LDLCALC 60 06/28/2016 1531  02/13/2018 total cholesterol 173, HDL 53, LDL 100,  triglycerides 98  Additional studies/ records that were reviewed today include:  Notes from [place] and neurology offices.  Echocardiogram from August 2016 showing normal dimensions of the aortic root, mild aortic insufficiency, mild  mitral insufficiency without mitral valve prolapse. Note LVEF 60-65% and normal diastolic left ventricular function. Reported elevation in systolic PA pressure 36 mmHg (RV-RA gradient 28).  ASSESSMENT:    1. Essential hypertension   2. Ehlers-Danlos syndrome   3. MVP (mitral valve prolapse)   4. Family history of coronary artery disease      PLAN:  In order of problems listed above:  1. Ehlers-Danlos sd.:  Benign findings on CT angiogram of the aorta and echocardiography.  Plan to repeat these every 5 years or so or if there is a change in symptoms.  Hopefully we will get the results of her genetic testing and this may further refine our schedule of monitoring. 2. HTN: Excellent control.  Watch for hyperkalemia with concomitant use of ARB and spironolactone, but without thiazide or loop diuretics.  Tolerating metoprolol as long as she takes it at night, otherwise it causes excessive fatigue.  Losartan added for theoretical advantage in presenting aneurysm formation. 3. MVP: Even though her echocardiogram did not describe mitral valve prolapse, her physical exam is quite convincing for this disorder.  No murmur heard even with provocative maneuvers.  Recheck echocardiogram in 2022 4. FHx CAD: Absent clear findings to suggest CAD or PAD, LDL cholesterol 100 is acceptable    Medication Adjustments/Labs and Tests Ordered: Current medicines are reviewed at length with the patient today.  Concerns regarding medicines are outlined above.  Medication changes, Labs and Tests ordered today are listed in the Patient Instructions below. Patient Instructions  Medication Instructions:  No Changes *If you need a refill on your cardiac medications  before your next appointment, please call your pharmacy*   Lab Work: None Ordered If you have labs (blood work) drawn today and your tests are completely normal, you will receive your results only by: Marland Kitchen MyChart Message (if you have MyChart) OR . A paper copy in the mail If you have any lab test that is abnormal or we need to change your treatment, we will call you to review the results.   Testing/Procedures: None Ordered   Follow-Up: At [facility], you and your health needs are our priority.  As part of our continuing mission to provide you with exceptional heart care, we have created designated Provider Care Teams.  These Care Teams include your primary Cardiologist (physician) and Advanced Practice Providers (APPs -  Physician Assistants and Nurse Practitioners) who all work together to provide you with the care you need, when you need it.  We recommend signing up for the patient portal called "MyChart".  Sign up information is provided on this After Visit Summary.  MyChart is used to connect with patients for Virtual Visits (Telemedicine).  Patients are able to view lab/test results, encounter notes, upcoming appointments, etc.  Non-urgent messages can be sent to your provider as well.   To learn more about what you can do with MyChart, go to [email or web address].    Your next appointment:   12 month(s)  The format for your next appointment:   In Person  Provider:   You may see Dr. Sallyanne Kuster or one of the following Advanced Practice Providers on your designated Care Team:    Almyra Deforest, PA-C  Fabian Sharp, [place] or   Roby Lofts, PA-C      Signed, Sanda Klein, MD  09/05/2019 12:48 PM    [address], [place], [place]  [address] Phone: [phone]; Fax: [phone]

## 2019-09-05 ENCOUNTER — Encounter: Payer: Self-pay | Admitting: Cardiovascular Disease

## 2019-09-10 DIAGNOSIS — G894 Chronic pain syndrome: Secondary | ICD-10-CM | POA: Diagnosis not present

## 2019-09-10 DIAGNOSIS — Q7962 Hypermobile Ehlers-Danlos syndrome: Secondary | ICD-10-CM | POA: Diagnosis not present

## 2019-09-10 DIAGNOSIS — I10 Essential (primary) hypertension: Secondary | ICD-10-CM | POA: Diagnosis not present

## 2019-09-21 ENCOUNTER — Encounter: Payer: Self-pay | Admitting: Internal Medicine

## 2019-09-21 ENCOUNTER — Ambulatory Visit: Payer: Medicare Other | Admitting: Internal Medicine

## 2019-09-21 VITALS — BP 120/78 | HR 80 | Ht 64.0 in | Wt 136.0 lb

## 2019-09-21 DIAGNOSIS — K219 Gastro-esophageal reflux disease without esophagitis: Secondary | ICD-10-CM | POA: Diagnosis not present

## 2019-09-21 DIAGNOSIS — K222 Esophageal obstruction: Secondary | ICD-10-CM | POA: Diagnosis not present

## 2019-09-21 DIAGNOSIS — R1310 Dysphagia, unspecified: Secondary | ICD-10-CM

## 2019-09-21 DIAGNOSIS — R110 Nausea: Secondary | ICD-10-CM

## 2019-09-21 MED ORDER — FAMOTIDINE 20 MG PO TABS: 20.0000 mg | ORAL_TABLET | Freq: Every day | ORAL | 3 refills | Status: DC

## 2019-09-21 MED ORDER — OMEPRAZOLE 40 MG PO CPDR: 40.0000 mg | DELAYED_RELEASE_CAPSULE | Freq: Two times a day (BID) | ORAL | 3 refills | Status: DC

## 2019-09-21 NOTE — Progress Notes (Signed)
HISTORY OF PRESENT ILLNESS:  Lorraine Weber is a 39 y.o. female with a history of traumatic head injury, spinal fusion surgery, Ehlers-Danlos syndrome followed at [facility], history of CSF leak, and questionable blood clotting disorder.  She has been evaluated in this office previously for constipation, abdominal pain, rectal bleeding, GERD, and dysphagia.  Colonoscopy December 12, 2017 revealed a diminutive adenoma but was otherwise normal including intubation of the terminal ileum.  Follow-up in 5 years recommended.  Her constipation has been treated with MiraLAX.  Next, upper endoscopy performed January 27, 2018 revealed a distal esophageal stricture as well as esophageal edema.  She was continued on twice daily PPI and the esophagus dilated to 55 [place].  At the time of her last visit March 09, 2018 she was doing well on medical therapy which included twice daily PPI and at night H2 receptor antagonist therapy.  MiraLAX is helping her bowels.  She was asked to follow-up in 1 year.  She presents today for follow-up with her husband.  She reports a several month history of recurrent problems with dysphagia items such as foods and pills.  She continues on her reflux regimen.  She continues with MiraLAX to address constipation.  She has had some problems with nausea and vomiting which she attributes to headaches.  Sounds like she was diagnosed with idiopathic intracranial hypertension and may be anticipating shunt therapy.  She has been on tramadol in recent months for pain.  We did review that this may disrupt GI motility.  GI review of systems is otherwise negative.  She has completed her Covid vaccination series  REVIEW OF SYSTEMS:  All non-GI ROS negative unless otherwise stated in the HPI except for migraines  Past Medical History:  Diagnosis Date  . Anemia   . Clotting disorder ([place])    Von Willebrand   . Concussion    x8, last 2010, residual homonymous Hemianopsia  . CSF leak    multiple serum  pathes  . Frequent sinus infections    tx with flonase nasal spray  . GERD (gastroesophageal reflux disease)   . History of blood transfusion    over 5 yrs ago  . Hypertension   . Mastoiditis   . Migraines   . Nephrolithiasis    X5, Dr Jeffie Pollock, passed stones, no surgery  . Peptic ulcer disease   . Von Willebrand disease ([place])    mild form per patient    Past Surgical History:  Procedure Laterality Date  . APPENDECTOMY    . CERVICAL FUSION  2018   C5-C7  . DILITATION & CURRETTAGE/HYSTROSCOPY WITH NOVASURE ABLATION N/A 08/23/2015   Procedure: DILATATION & CURETTAGE/HYSTEROSCOPY WITH NOVASURE ABLATION;  Surgeon: Vanessa Kick, MD;  Location: [facility];  Service: Gynecology;  Laterality: N/A;  . EXTRACORPOREAL SHOCK WAVE LITHOTRIPSY Left 03/19/2018   Procedure: EXTRACORPOREAL SHOCK WAVE LITHOTRIPSY (ESWL);  Surgeon: Bjorn Loser, MD;  Location: [facility];  Service: Urology;  Laterality: Left;  . HAND TENDON SURGERY Left    left, complicated by MRSA  . LAMINECTOMY  21016   T10-T12  . spinal fluid leak patching     > 10X;[facility] - blood patching  . SPINAL FUSION  2017   T10-T11  . SPINAL FUSION  2018   T10-L1  . WISDOM TOOTH EXTRACTION      Social History KIONNA BRIER  reports that she has quit smoking. Her smoking use included cigarettes. She has a 3.00 pack-year smoking history. She has never used smokeless tobacco. She  reports that she does not drink alcohol and does not use drugs.  family history includes Arthritis in her father; Diabetes in her father, maternal grandmother, and paternal grandfather; Heart disease in her brother, maternal aunt, maternal grandfather, maternal grandmother, maternal uncle, and mother; Hyperlipidemia in her father and paternal grandfather; Kidney disease in her father; Other in an other family member; Prostate cancer in her paternal grandfather; Stroke in her father; Stroke (age of onset: 68) in her maternal aunt.  Allergies  Allergen Reactions  .  Acetazolamide Shortness Of Breath  . Aspirin Other (See Comments)    REACTION: ANY BLOOD THINNERS/ DUE TO BLOOD DISORDER-VON WILDEBRAND'S   . Other Other (See Comments)    Pt Instructed by hematologist not to take blood thinners  ALL BLOOD THINNERS-CANNOT TAKE DUE TO [facility]   . Topamax [Topiramate] Other (See Comments)    HYPOTENSION AND SYNCOPE   . Levofloxacin Itching and Rash  . Levofloxacin In D5w Swelling and Rash  . Sulfamethoxazole-Trimethoprim Rash    "Body Aches"       PHYSICAL EXAMINATION: Vital signs: BP 120/78   Pulse 80   Ht 5\' 4"  (1.626 m)   Wt 136 lb (61.7 kg)   BMI 23.34 kg/m   Constitutional: generally well-appearing, no acute distress Psychiatric: alert and oriented x3, cooperative Eyes: extraocular movements intact, anicteric, conjunctiva pink Mouth: oral pharynx moist, no lesions Neck: supple no lymphadenopathy Cardiovascular: heart regular rate and rhythm, no murmur Lungs: clear to auscultation bilaterally Abdomen: soft, nontender, nondistended, no obvious ascites, no peritoneal signs, normal bowel sounds, no organomegaly Rectal: Omitted Extremities: no clubbing, cyanosis, or lower extremity edema bilaterally Skin: no lesions on visible extremities Neuro: No focal deficits.  Cranial nerves intact  ASSESSMENT:  #1.  Chronic GERD.  Classic symptoms controlled with twice daily PPI at night H2 receptor antagonist therapy in the form of Pepcid 2.  Recurrent dysphagia likely secondary to peptic stricture.  Previous dilation to 95 [place], 2019. 3.  History of adenomatous colon polyp 2019 4.  Intermittent problems with nausea and vomiting attributed to headaches #5.  Multiple general medical problems  PLAN:  #1.  Reflux precautions 2.  Continue twice daily PPI and H2 receptor antagonist therapy 3.  Refill medications.  Medication risks reviewed 4.  Schedule upper endoscopy with probable esophageal dilation.The nature of the procedure, as well as  the risks, benefits, and alternatives were carefully and thoroughly reviewed with the patient. Ample time for discussion and questions allowed. The patient understood, was satisfied, and agreed to proceed. 5.  Surveillance colonoscopy around 2024

## 2019-09-21 NOTE — Patient Instructions (Signed)
We have sent the following medications to your pharmacy for you to pick up at your convenience:  Pepcid, Prilosec  You have been scheduled for an endoscopy. Please follow written instructions given to you at your visit today. If you use inhalers (even only as needed), please bring them with you on the day of your procedure.

## 2019-09-24 ENCOUNTER — Encounter: Payer: Self-pay | Admitting: Internal Medicine

## 2019-09-28 ENCOUNTER — Other Ambulatory Visit: Payer: Self-pay

## 2019-09-28 MED ORDER — LOSARTAN POTASSIUM 25 MG PO TABS: 25.0000 mg | ORAL_TABLET | Freq: Every day | ORAL | 2 refills | Status: DC

## 2019-09-30 ENCOUNTER — Other Ambulatory Visit: Payer: Self-pay

## 2019-09-30 ENCOUNTER — Encounter: Payer: Self-pay | Admitting: Internal Medicine

## 2019-09-30 ENCOUNTER — Ambulatory Visit (AMBULATORY_SURGERY_CENTER): Payer: Medicare Other | Admitting: Internal Medicine

## 2019-09-30 VITALS — BP 122/56 | HR 75 | Temp 98.9°F | Resp 16 | Ht 64.0 in | Wt 136.0 lb

## 2019-09-30 DIAGNOSIS — K219 Gastro-esophageal reflux disease without esophagitis: Secondary | ICD-10-CM | POA: Diagnosis not present

## 2019-09-30 DIAGNOSIS — K209 Esophagitis, unspecified without bleeding: Secondary | ICD-10-CM

## 2019-09-30 DIAGNOSIS — K2100 Gastro-esophageal reflux disease with esophagitis, without bleeding: Secondary | ICD-10-CM | POA: Diagnosis not present

## 2019-09-30 DIAGNOSIS — R1310 Dysphagia, unspecified: Secondary | ICD-10-CM

## 2019-09-30 MED ORDER — SODIUM CHLORIDE 0.9 % IV SOLN
500.0000 mL | Freq: Once | INTRAVENOUS | Status: DC
Start: 2019-09-30 — End: 2019-09-30

## 2019-09-30 NOTE — Op Note (Signed)
[place] Patient Name: Lorraine Weber Procedure Date: 09/30/2019 4:40 PM MRN: [id number] Endoscopist: Docia Chuck. Henrene Pastor , MD Age: 39 Referring MD:  Date of Birth: [birth year] Gender: Female Account #: [id number] Procedure:                Upper GI endoscopy biopsies; with dilation of the                            esophagus Venia Minks 50 [place]) Indications:              Dysphagia, Therapeutic procedure, Esophageal reflux Medicines:                Monitored Anesthesia Care Procedure:                Pre-Anesthesia Assessment:                           - Prior to the procedure, a History and Physical                            was performed, and patient medications and                            allergies were reviewed. The patient's tolerance of                            previous anesthesia was also reviewed. The risks                            and benefits of the procedure and the sedation                            options and risks were discussed with the patient.                            All questions were answered, and informed consent                            was obtained. Prior Anticoagulants: The patient has                            taken no previous anticoagulant or antiplatelet                            agents. ASA Grade Assessment: II - A patient with                            mild systemic disease. After reviewing the risks                            and benefits, the patient was deemed in                            satisfactory condition to undergo the procedure.  After obtaining informed consent, the endoscope was                            passed under direct vision. Throughout the                            procedure, the patient's blood pressure, pulse, and                            oxygen saturations were monitored continuously. The                            Endoscope was introduced through the mouth, and                             advanced to the second part of duodenum. The upper                            GI endoscopy was accomplished without difficulty.                            The patient tolerated the procedure well. Scope In: Scope Out: Findings:                 The esophagus revealed several ribbonlike exudates                            most prominent distally but progressing proximally                            to the upper third of the esophagus. There were the                            suggestions of rings. Changes most consistent with                            reflux, though cannot rule out Candida or EOE. One                            benign-appearing, intrinsic moderate stenosis was                            found 38 cm from the incisors. This stenosis                            measured 1.4 cm (inner diameter). After completing                            the endoscopic survey, the scope was withdrawn.                            Dilation was performed with a Maloney dilator with  no resistance at 52 Fr. This was biopsied with a                            cold forceps for histology.                           The stomach was normal.                           The examined duodenum was normal.                           The cardia and gastric fundus were normal on                            retroflexion. Complications:            No immediate complications. Estimated Blood Loss:     Estimated blood loss: none. Impression:               - Benign-appearing esophageal stenosis. Dilated.                            Biopsied.                           - Normal stomach.                           - Normal examined duodenum. Recommendation:           1. Patient has a contact number available for                            emergencies. The signs and symptoms of potential                            delayed complications were discussed with the                            patient.  Return to normal activities tomorrow.                            Written discharge instructions were provided to the                            patient.                           2. Post dilation diet.                           3. Continue present medications.                           4. Await pathology results.                           5. Office follow-up with Dr.  Westlyn Glaza in 6 to 8 weeks Jencarlos Nicolson N. Henrene Pastor, MD 09/30/2019 5:19:30 PM This report has been signed electronically.

## 2019-09-30 NOTE — Progress Notes (Signed)
Patients history reviewed. Patient vaccinated for covid. VS:C.W

## 2019-09-30 NOTE — Progress Notes (Signed)
Called to room to assist during endoscopic procedure.  Patient ID and intended procedure confirmed with present staff. Received instructions for my participation in the procedure from the performing physician.  

## 2019-09-30 NOTE — Patient Instructions (Signed)
YOU HAD AN ENDOSCOPIC PROCEDURE TODAY AT [facility]:   Refer to the procedure report that was given to you for any specific questions about what was found during the examination.  If the procedure report does not answer your questions, please call your gastroenterologist to clarify.  If you requested that your care partner not be given the details of your procedure findings, then the procedure report has been included in a sealed envelope for you to review at your convenience later.  YOU SHOULD EXPECT: Some feelings of bloating in the abdomen. Passage of more gas than usual.  Walking can help get rid of the air that was put into your GI tract during the procedure and reduce the bloating. If you had a lower endoscopy (such as a colonoscopy or flexible sigmoidoscopy) you may notice spotting of blood in your stool or on the toilet paper. If you underwent a bowel prep for your procedure, you may not have a normal bowel movement for a few days.  Please Note:  You might notice some irritation and congestion in your nose or some drainage.  This is from the oxygen used during your procedure.  There is no need for concern and it should clear up in a day or so.  SYMPTOMS TO REPORT IMMEDIATELY:    Following upper endoscopy (EGD)  Vomiting of blood or coffee ground material  New chest pain or pain under the shoulder blades  Painful or persistently difficult swallowing  New shortness of breath  Fever of 100F or higher  Black, tarry-looking stools  For urgent or emergent issues, a gastroenterologist can be reached at any hour by calling [phone]. Do not use MyChart messaging for urgent concerns.    DIET:  Post dilation diet today. You may proceed to your regular diet tomorrow.  Drink plenty of fluids but you should avoid alcoholic beverages for 24 hours.  ACTIVITY:  You should plan to take it easy for the rest of today and you should NOT DRIVE or use heavy machinery until tomorrow  (because of the sedation medicines used during the test).    FOLLOW UP: Our staff will call the number listed on your records 48-72 hours following your procedure to check on you and address any questions or concerns that you may have regarding the information given to you following your procedure. If we do not reach you, we will leave a message.  We will attempt to reach you two times.  During this call, we will ask if you have developed any symptoms of COVID 19. If you develop any symptoms (ie: fever, flu-like symptoms, shortness of breath, cough etc.) before then, please call [phone].  If you test positive for Covid 19 in the 2 weeks post procedure, please call and report this information to [place].    If any biopsies were taken you will be contacted by phone or by letter within the next 1-3 weeks.  Please call us at [phone] if you have not heard about the biopsies in 3 weeks.    SIGNATURES/CONFIDENTIALITY: You and/or your care partner have signed paperwork which will be entered into your electronic medical record.  These signatures attest to the fact that that the information above on your After Visit Summary has been reviewed and is understood.  Full responsibility of the confidentiality of this discharge information lies with you and/or your care-partner.

## 2019-09-30 NOTE — Progress Notes (Signed)
PT taken to PACU. Monitors in place. VSS. Report given to RN. 

## 2019-10-04 ENCOUNTER — Telehealth: Payer: Self-pay

## 2019-10-04 ENCOUNTER — Telehealth: Payer: Self-pay | Admitting: *Deleted

## 2019-10-04 NOTE — Telephone Encounter (Signed)
1st follow up call made.  NALM 

## 2019-10-04 NOTE — Telephone Encounter (Signed)
  Follow up Call-  Call back number 09/30/2019 01/27/2018 12/12/2017  Post procedure Call Back phone  # [phone]  Permission to leave phone message Yes Yes Yes  Some recent data might be hidden     Patient questions:  Do you have a fever, pain , or abdominal swelling? No. Pain Score  0 *  Have you tolerated food without any problems? Yes.    Have you been able to return to your normal activities? Yes.    Do you have any questions about your discharge instructions: Diet   No. Medications  No. Follow up visit  No.  Do you have questions or concerns about your Care? No.  Actions: * If pain score is 4 or above: No action needed, pain <4.

## 2019-10-05 ENCOUNTER — Other Ambulatory Visit: Payer: Self-pay | Admitting: Cardiovascular Disease

## 2019-10-05 ENCOUNTER — Other Ambulatory Visit: Payer: Self-pay

## 2019-10-05 MED ORDER — METOPROLOL SUCCINATE ER 50 MG PO TB24
50.0000 mg | ORAL_TABLET | Freq: Every day | ORAL | 3 refills | Status: DC
Start: 2019-10-05 — End: 2019-10-07

## 2019-10-05 NOTE — Telephone Encounter (Signed)
New Message    *STAT* If patient is at the pharmacy, call can be transferred to refill team.   1. Which medications need to be refilled? (please list name of each medication and dose if known) metoprolol succinate (TOPROL-XL) 50 MG 24 hr tablet   2. Which pharmacy/location (including street and city if local pharmacy) is medication to be sent to? [facility] [place]  3. Do they need a 30 day or 90 day supply? [place]

## 2019-10-06 ENCOUNTER — Encounter: Payer: Self-pay | Admitting: Internal Medicine

## 2019-10-06 NOTE — Telephone Encounter (Signed)
Patient is having her meds delivered tomorrow, please send refill.  Please resend refill to the correct pharmacy as requested below.

## 2019-10-07 MED ORDER — METOPROLOL SUCCINATE ER 50 MG PO TB24: 50.0000 mg | ORAL_TABLET | Freq: Every day | ORAL | 3 refills | Status: AC

## 2019-10-08 DIAGNOSIS — H9313 Tinnitus, bilateral: Secondary | ICD-10-CM | POA: Diagnosis not present

## 2019-10-08 DIAGNOSIS — H538 Other visual disturbances: Secondary | ICD-10-CM | POA: Diagnosis not present

## 2019-10-08 DIAGNOSIS — Q796 Ehlers-Danlos syndrome, unspecified: Secondary | ICD-10-CM | POA: Diagnosis not present

## 2019-10-08 DIAGNOSIS — G932 Benign intracranial hypertension: Secondary | ICD-10-CM | POA: Diagnosis not present

## 2019-10-08 DIAGNOSIS — R1110 Vomiting, unspecified: Secondary | ICD-10-CM | POA: Diagnosis not present

## 2019-10-08 DIAGNOSIS — G43909 Migraine, unspecified, not intractable, without status migrainosus: Secondary | ICD-10-CM | POA: Diagnosis not present

## 2019-10-12 DIAGNOSIS — I10 Essential (primary) hypertension: Secondary | ICD-10-CM | POA: Diagnosis not present

## 2019-10-12 DIAGNOSIS — G894 Chronic pain syndrome: Secondary | ICD-10-CM | POA: Diagnosis not present

## 2019-10-12 DIAGNOSIS — Q7962 Hypermobile Ehlers-Danlos syndrome: Secondary | ICD-10-CM | POA: Diagnosis not present

## 2019-10-25 DIAGNOSIS — G932 Benign intracranial hypertension: Secondary | ICD-10-CM | POA: Diagnosis not present

## 2019-10-25 DIAGNOSIS — G96810 Intracranial hypotension, unspecified: Secondary | ICD-10-CM | POA: Diagnosis not present

## 2019-10-25 DIAGNOSIS — Q796 Ehlers-Danlos syndrome, unspecified: Secondary | ICD-10-CM | POA: Diagnosis not present

## 2019-10-25 DIAGNOSIS — I871 Compression of vein: Secondary | ICD-10-CM | POA: Diagnosis not present

## 2019-10-27 DIAGNOSIS — M50221 Other cervical disc displacement at C4-C5 level: Secondary | ICD-10-CM | POA: Diagnosis not present

## 2019-10-27 DIAGNOSIS — M4802 Spinal stenosis, cervical region: Secondary | ICD-10-CM | POA: Diagnosis not present

## 2019-10-27 DIAGNOSIS — I871 Compression of vein: Secondary | ICD-10-CM | POA: Diagnosis not present

## 2019-11-02 DIAGNOSIS — Q796 Ehlers-Danlos syndrome, unspecified: Secondary | ICD-10-CM | POA: Diagnosis not present

## 2019-11-02 DIAGNOSIS — I82C11 Acute embolism and thrombosis of right internal jugular vein: Secondary | ICD-10-CM | POA: Diagnosis not present

## 2019-11-02 DIAGNOSIS — I676 Nonpyogenic thrombosis of intracranial venous system: Secondary | ICD-10-CM | POA: Diagnosis not present

## 2019-11-02 DIAGNOSIS — G43909 Migraine, unspecified, not intractable, without status migrainosus: Secondary | ICD-10-CM | POA: Diagnosis not present

## 2019-11-02 DIAGNOSIS — R519 Headache, unspecified: Secondary | ICD-10-CM | POA: Diagnosis not present

## 2019-11-11 DIAGNOSIS — I871 Compression of vein: Secondary | ICD-10-CM | POA: Diagnosis not present

## 2019-11-11 DIAGNOSIS — I10 Essential (primary) hypertension: Secondary | ICD-10-CM | POA: Diagnosis not present

## 2019-11-11 DIAGNOSIS — Q7962 Hypermobile Ehlers-Danlos syndrome: Secondary | ICD-10-CM | POA: Diagnosis not present

## 2019-11-11 DIAGNOSIS — G894 Chronic pain syndrome: Secondary | ICD-10-CM | POA: Diagnosis not present

## 2019-11-25 DIAGNOSIS — H538 Other visual disturbances: Secondary | ICD-10-CM | POA: Diagnosis not present

## 2019-11-25 DIAGNOSIS — H9313 Tinnitus, bilateral: Secondary | ICD-10-CM | POA: Diagnosis not present

## 2019-11-25 DIAGNOSIS — Z95828 Presence of other vascular implants and grafts: Secondary | ICD-10-CM | POA: Diagnosis not present

## 2019-11-25 DIAGNOSIS — I871 Compression of vein: Secondary | ICD-10-CM | POA: Diagnosis not present

## 2019-11-30 ENCOUNTER — Ambulatory Visit: Payer: Medicare Other | Admitting: Internal Medicine

## 2019-11-30 ENCOUNTER — Encounter: Payer: Self-pay | Admitting: Internal Medicine

## 2019-11-30 VITALS — BP 126/74 | HR 90 | Ht 65.0 in | Wt 133.0 lb

## 2019-11-30 DIAGNOSIS — K222 Esophageal obstruction: Secondary | ICD-10-CM | POA: Diagnosis not present

## 2019-11-30 DIAGNOSIS — R1310 Dysphagia, unspecified: Secondary | ICD-10-CM | POA: Diagnosis not present

## 2019-11-30 DIAGNOSIS — K219 Gastro-esophageal reflux disease without esophagitis: Secondary | ICD-10-CM | POA: Diagnosis not present

## 2019-11-30 NOTE — Patient Instructions (Signed)
Please follow up as needed 

## 2019-11-30 NOTE — Progress Notes (Signed)
HISTORY OF PRESENT ILLNESS:  Lorraine Weber is a 39 y.o. female with multiple medical problems as listed below who presents today for follow-up post endoscopy.  She underwent upper endoscopy September 30, 2019 for dysphagia.  She was found to have whitish exudate throughout the esophagus.  Biopsies revealed esophagitis.  Distal narrowing which was dilated with 52 [place] Maloney dilator.  He is continued on twice daily PPI and at night H2 receptor antagonist therapy.  Tells me that her classic reflux symptoms are for the most part controlled.  However, she continues with intermittent dysphagia.  Mostly solids.  Also tells me that she was evaluated for chronic headaches and found to have carotid narrowing for which he underwent carotid stenting on the left.  She continues to have headaches.  She is now on aspirin and Plavix.  She has a sore area on her left neck which she wishes to have evaluated.  No other active GI complaints.  She is accompanied by her husband.  She has completed her Covid vaccination series  REVIEW OF SYSTEMS:  All non-GI ROS negative except for headaches, back pain, hematuria, visual change, fatigue  Past Medical History:  Diagnosis Date  . Anemia   . Clotting disorder ([place])    Von Willebrand   . Concussion    x8, last 2010, residual homonymous Hemianopsia  . CSF leak    multiple serum pathes  . Frequent sinus infections    tx with flonase nasal spray  . GERD (gastroesophageal reflux disease)   . History of blood transfusion    over 5 yrs ago  . Hypertension   . Mastoiditis   . Migraines   . Nephrolithiasis    X5, Dr Jeffie Pollock, passed stones, no surgery  . Peptic ulcer disease   . Von Willebrand disease ([place])    mild form per patient    Past Surgical History:  Procedure Laterality Date  . APPENDECTOMY    . CERVICAL FUSION  2018   C5-C7  . DILITATION & CURRETTAGE/HYSTROSCOPY WITH NOVASURE ABLATION N/A 08/23/2015   Procedure: DILATATION & CURETTAGE/HYSTEROSCOPY WITH  NOVASURE ABLATION;  Surgeon: Vanessa Kick, MD;  Location: [facility];  Service: Gynecology;  Laterality: N/A;  . EXTRACORPOREAL SHOCK WAVE LITHOTRIPSY Left 03/19/2018   Procedure: EXTRACORPOREAL SHOCK WAVE LITHOTRIPSY (ESWL);  Surgeon: Bjorn Loser, MD;  Location: [facility];  Service: Urology;  Laterality: Left;  . HAND TENDON SURGERY Left    left, complicated by MRSA  . LAMINECTOMY  21016   T10-T12  . spinal fluid leak patching     > 10X;[facility] - blood patching  . SPINAL FUSION  2017   T10-T11  . SPINAL FUSION  2018   T10-L1  . WISDOM TOOTH EXTRACTION      Social History SHERLENE RICKEL  reports that she has quit smoking. Her smoking use included cigarettes. She has a 3.00 pack-year smoking history. She has never used smokeless tobacco. She reports that she does not drink alcohol and does not use drugs.  family history includes Arthritis in her father; Diabetes in her father, maternal grandmother, and paternal grandfather; Heart disease in her brother, maternal aunt, maternal grandfather, maternal grandmother, maternal uncle, and mother; Hyperlipidemia in her father and paternal grandfather; Kidney disease in her father; Other in an other family member; Prostate cancer in her paternal grandfather; Stroke in her father; Stroke (age of onset: 54) in her maternal aunt.  Allergies  Allergen Reactions  . Acetazolamide Shortness Of Breath  . Aspirin Other (See  Comments)    REACTION: ANY BLOOD THINNERS/ DUE TO BLOOD DISORDER-VON WILDEBRAND'S   . Other Other (See Comments)    Pt Instructed by hematologist not to take blood thinners  ALL BLOOD THINNERS-CANNOT TAKE DUE TO [facility]   . Topamax [Topiramate] Other (See Comments)    HYPOTENSION AND SYNCOPE   . Levofloxacin Itching and Rash  . Levofloxacin In D5w Swelling and Rash  . Sulfamethoxazole-Trimethoprim Rash    "Body Aches"       PHYSICAL EXAMINATION: Vital signs: BP 126/74   Pulse 90   Ht 5\' 5"  (1.651 m)   Wt 133 lb (60.3 kg)    BMI 22.13 kg/m   Constitutional: generally well-appearing, no acute distress Psychiatric: alert and oriented x3, cooperative Eyes: extraocular movements intact, anicteric, conjunctiva pink Mouth: oral pharynx moist, no lesions Neck: supple.  Focal tender posterior cervical lymph node on the left Cardiovascular: heart regular rate and rhythm, no murmur Lungs: clear to auscultation bilaterally Abdomen: soft, nontender, nondistended, no obvious ascites, no peritoneal signs, normal bowel sounds, no organomegaly Rectal: Omitted Extremities: no clubbing, cyanosis, or lower extremity edema bilaterally Skin: no lesions on visible extremities Neuro: No focal deficits.  Cranial nerves intact  ASSESSMENT:  1.  GERD with esophagitis and stricture.  Ongoing complaints of dysphagia post dilation.  [place]  Multiple medical problems 3.  Recent carotid stent placement now on aspirin Plavix   PLAN:  1.  Continue PPI and H2 receptor antagonist therapies 2.  Chew food well 3.  She anticipates being off Plavix for the end of the year.  If dysphagia persists or worsens then we could consider repeat dilation with larger dilator thereafter.  In this regard, she will contact the office.

## 2019-12-01 DIAGNOSIS — R319 Hematuria, unspecified: Secondary | ICD-10-CM | POA: Diagnosis not present

## 2019-12-01 DIAGNOSIS — Z1231 Encounter for screening mammogram for malignant neoplasm of breast: Secondary | ICD-10-CM | POA: Diagnosis not present

## 2019-12-02 DIAGNOSIS — R509 Fever, unspecified: Secondary | ICD-10-CM | POA: Diagnosis not present

## 2019-12-02 DIAGNOSIS — R590 Localized enlarged lymph nodes: Secondary | ICD-10-CM | POA: Diagnosis not present

## 2019-12-11 DIAGNOSIS — I10 Essential (primary) hypertension: Secondary | ICD-10-CM | POA: Diagnosis not present

## 2019-12-11 DIAGNOSIS — Q7962 Hypermobile Ehlers-Danlos syndrome: Secondary | ICD-10-CM | POA: Diagnosis not present

## 2019-12-11 DIAGNOSIS — G894 Chronic pain syndrome: Secondary | ICD-10-CM | POA: Diagnosis not present

## 2019-12-13 DIAGNOSIS — G932 Benign intracranial hypertension: Secondary | ICD-10-CM | POA: Diagnosis not present

## 2019-12-13 DIAGNOSIS — H5713 Ocular pain, bilateral: Secondary | ICD-10-CM | POA: Diagnosis not present

## 2019-12-24 DIAGNOSIS — R29898 Other symptoms and signs involving the musculoskeletal system: Secondary | ICD-10-CM | POA: Diagnosis not present

## 2019-12-24 DIAGNOSIS — Q796 Ehlers-Danlos syndrome, unspecified: Secondary | ICD-10-CM | POA: Diagnosis not present

## 2019-12-24 DIAGNOSIS — Z9889 Other specified postprocedural states: Secondary | ICD-10-CM | POA: Diagnosis not present

## 2019-12-24 DIAGNOSIS — Z7982 Long term (current) use of aspirin: Secondary | ICD-10-CM | POA: Diagnosis not present

## 2019-12-24 DIAGNOSIS — G932 Benign intracranial hypertension: Secondary | ICD-10-CM | POA: Diagnosis not present

## 2019-12-24 DIAGNOSIS — Z7902 Long term (current) use of antithrombotics/antiplatelets: Secondary | ICD-10-CM | POA: Diagnosis not present

## 2019-12-24 DIAGNOSIS — G9601 Cranial cerebrospinal fluid leak, spontaneous: Secondary | ICD-10-CM | POA: Diagnosis not present

## 2019-12-24 DIAGNOSIS — Z95828 Presence of other vascular implants and grafts: Secondary | ICD-10-CM | POA: Diagnosis not present

## 2019-12-30 DIAGNOSIS — I676 Nonpyogenic thrombosis of intracranial venous system: Secondary | ICD-10-CM | POA: Diagnosis not present

## 2019-12-30 DIAGNOSIS — I871 Compression of vein: Secondary | ICD-10-CM | POA: Diagnosis not present

## 2019-12-30 DIAGNOSIS — G932 Benign intracranial hypertension: Secondary | ICD-10-CM | POA: Diagnosis not present

## 2020-01-11 DIAGNOSIS — G894 Chronic pain syndrome: Secondary | ICD-10-CM | POA: Diagnosis not present

## 2020-01-11 DIAGNOSIS — J449 Chronic obstructive pulmonary disease, unspecified: Secondary | ICD-10-CM | POA: Diagnosis not present

## 2020-01-11 DIAGNOSIS — M159 Polyosteoarthritis, unspecified: Secondary | ICD-10-CM | POA: Diagnosis not present

## 2020-01-21 DIAGNOSIS — I9789 Other postprocedural complications and disorders of the circulatory system, not elsewhere classified: Secondary | ICD-10-CM | POA: Diagnosis not present

## 2020-01-21 DIAGNOSIS — T81509S Unspecified complication of foreign body accidentally left in body following unspecified procedure, sequela: Secondary | ICD-10-CM | POA: Diagnosis not present

## 2020-01-21 DIAGNOSIS — T81509D Unspecified complication of foreign body accidentally left in body following unspecified procedure, subsequent encounter: Secondary | ICD-10-CM | POA: Diagnosis not present

## 2020-01-26 DIAGNOSIS — I871 Compression of vein: Secondary | ICD-10-CM | POA: Diagnosis not present

## 2020-01-26 DIAGNOSIS — Z95828 Presence of other vascular implants and grafts: Secondary | ICD-10-CM | POA: Diagnosis not present

## 2020-01-26 DIAGNOSIS — G932 Benign intracranial hypertension: Secondary | ICD-10-CM | POA: Diagnosis not present

## 2020-02-03 DIAGNOSIS — I871 Compression of vein: Secondary | ICD-10-CM | POA: Diagnosis not present

## 2020-02-03 DIAGNOSIS — G932 Benign intracranial hypertension: Secondary | ICD-10-CM | POA: Diagnosis not present

## 2020-02-03 DIAGNOSIS — Z7902 Long term (current) use of antithrombotics/antiplatelets: Secondary | ICD-10-CM | POA: Diagnosis not present

## 2020-02-03 DIAGNOSIS — Z7982 Long term (current) use of aspirin: Secondary | ICD-10-CM | POA: Diagnosis not present

## 2020-02-11 DIAGNOSIS — G894 Chronic pain syndrome: Secondary | ICD-10-CM | POA: Diagnosis not present

## 2020-02-11 DIAGNOSIS — M159 Polyosteoarthritis, unspecified: Secondary | ICD-10-CM | POA: Diagnosis not present

## 2020-02-11 DIAGNOSIS — J449 Chronic obstructive pulmonary disease, unspecified: Secondary | ICD-10-CM | POA: Diagnosis not present

## 2020-02-17 ENCOUNTER — Encounter (HOSPITAL_COMMUNITY): Payer: Self-pay | Admitting: Emergency Medicine

## 2020-02-17 ENCOUNTER — Emergency Department (HOSPITAL_COMMUNITY): Payer: Medicare Other

## 2020-02-17 ENCOUNTER — Telehealth: Payer: Self-pay | Admitting: Cardiovascular Disease

## 2020-02-17 ENCOUNTER — Other Ambulatory Visit: Payer: Self-pay

## 2020-02-17 ENCOUNTER — Emergency Department (HOSPITAL_COMMUNITY)
Admission: EM | Admit: 2020-02-17 | Discharge: 2020-02-18 | Disposition: A | Discharge status: Home / Self Care | Payer: Medicare Other | Attending: Emergency Medicine | Admitting: Emergency Medicine

## 2020-02-17 DIAGNOSIS — R0602 Shortness of breath: Secondary | ICD-10-CM | POA: Diagnosis not present

## 2020-02-17 DIAGNOSIS — R0789 Other chest pain: Secondary | ICD-10-CM | POA: Diagnosis not present

## 2020-02-17 DIAGNOSIS — Z79899 Other long term (current) drug therapy: Secondary | ICD-10-CM | POA: Insufficient documentation

## 2020-02-17 DIAGNOSIS — Z7982 Long term (current) use of aspirin: Secondary | ICD-10-CM | POA: Insufficient documentation

## 2020-02-17 DIAGNOSIS — Z87891 Personal history of nicotine dependence: Secondary | ICD-10-CM | POA: Insufficient documentation

## 2020-02-17 DIAGNOSIS — N200 Calculus of kidney: Secondary | ICD-10-CM | POA: Diagnosis not present

## 2020-02-17 DIAGNOSIS — I2699 Other pulmonary embolism without acute cor pulmonale: Secondary | ICD-10-CM | POA: Diagnosis not present

## 2020-02-17 DIAGNOSIS — I10 Essential (primary) hypertension: Secondary | ICD-10-CM | POA: Insufficient documentation

## 2020-02-17 LAB — TROPONIN I (HIGH SENSITIVITY)
Troponin I (High Sensitivity): <2 ng/L (ref <18)
Troponin I (High Sensitivity): <2 ng/L (ref <18)

## 2020-02-17 LAB — CBC
HCT: 39.3 % (ref 36.0–46.0)
Hemoglobin: 12.7 g/dL (ref 12.0–15.0)
MCH: 28.7 pg (ref 26.0–34.0)
MCHC: 32.3 g/dL (ref 30.0–36.0)
MCV: 88.9 fL (ref 80.0–100.0)
Platelets: 315 10*3/uL (ref 150–400)
RBC: 4.42 MIL/uL (ref 3.87–5.11)
RDW: 14.6 % (ref 11.5–15.5)
WBC: 5.2 10*3/uL (ref 4.0–10.5)
nRBC: 0 % (ref 0.0–0.2)

## 2020-02-17 LAB — BASIC METABOLIC PANEL
Anion gap: 11 (ref 5–15)
BUN: 7 mg/dL (ref 6–20)
CO2: 21 mmol/L — ABNORMAL LOW (ref 22–32)
Calcium: 8.9 mg/dL (ref 8.9–10.3)
Chloride: 104 mmol/L (ref 98–111)
Creatinine, Ser: 0.77 mg/dL (ref 0.44–1.00)
GFR, Estimated: >60 mL/min (ref >60)
Glucose, Bld: 97 mg/dL (ref 70–99)
Potassium: 4.1 mmol/L (ref 3.5–5.1)
Sodium: 136 mmol/L (ref 135–145)

## 2020-02-17 NOTE — ED Triage Notes (Signed)
Patient arrives to ED with c/o chest pain and shortness of breath x4 days. Pt stated the SOB continues to worsen where she started to feel weak, fatigued, and experiencing DOE. Pt stated the CP was intermittent but now constant. CP radiates to left arm. Pt reports swollen ankles and pale hand/feet. Pt with extensive medical hx.

## 2020-02-17 NOTE — Telephone Encounter (Signed)
Spoke with patient who states that she recently had a procedure done to place a stent into her Right Internal Jugular back in December right before christmas. Patient states that over the last 4 days she has been short of breath that has severely worsened to the point that today it took her 2 and 1/2 hours to make her bed. Patient states that she has also been experiencing chest pain on the left side of the chest that is radiating to her left shoulder, upper left portion of back and collarbone area. Patient states that this chest pain is exacerbated with exertion. Patient states that she is also experiencing coldness. Redness, and pain in all of her fingers and toes, along with terrible head aches. Patient states that she placed a call to her neurosurgeon is awaiting a call back. Patient states that she is very fatigued and pale and that her legs and ankles are slightly swollen. Patient also reports having some retained hardware from a previous procedure near the bottom of her heart which she is unsure about whether or not this is causing her pain. Advised patient that due to the nature of her chest pain and worsening shortness of breath she should report to the ER for evaluation. Patient states that she is reluctant to go to the ER due to how busy it is.   Spoke with DOD (Dr. Flora Lipps) who recommends ER evaluation.   Advised patient of the importance of going to the ER for evaluation. Patient verbalized understanding and states she is going to report to the ER for evaluation.

## 2020-02-17 NOTE — Telephone Encounter (Signed)
° ° °  Pt c/o Shortness Of Breath: STAT if SOB developed within the last 24 hours or pt is noticeably SOB on the phone  1. Are you currently SOB (can you hear that pt is SOB on the phone)? no  2. How long have you been experiencing SOB? A week  3. Are you SOB when sitting or when up moving around? Usually when up and moving around. Sometimes the patient gets SOB when she lays down   4. Are you currently experiencing any other symptoms? Patient is experiencing some discomfort in her chest on the L side. She has some screws and other metal in her spine but she is not sure if the hardware is causing the pain or not. Patient and her Husband wanted to know what type of symptoms  would constitute an urgent ER visit and what could be done at home.The patient does not want to go to the ER unless necessary or "[place]"   Please advise

## 2020-02-18 ENCOUNTER — Emergency Department (HOSPITAL_COMMUNITY): Payer: Medicare Other

## 2020-02-18 DIAGNOSIS — R0602 Shortness of breath: Secondary | ICD-10-CM | POA: Diagnosis not present

## 2020-02-18 DIAGNOSIS — I2699 Other pulmonary embolism without acute cor pulmonale: Secondary | ICD-10-CM | POA: Diagnosis not present

## 2020-02-18 DIAGNOSIS — R0789 Other chest pain: Secondary | ICD-10-CM | POA: Diagnosis not present

## 2020-02-18 DIAGNOSIS — N200 Calculus of kidney: Secondary | ICD-10-CM | POA: Diagnosis not present

## 2020-02-18 LAB — BRAIN NATRIURETIC PEPTIDE: B Natriuretic Peptide: 18.4 pg/mL (ref 0.0–100.0)

## 2020-02-18 MED ORDER — IOHEXOL 350 MG/ML SOLN
100.0000 mL | Freq: Once | INTRAVENOUS | Status: AC
  Administered 2020-02-18: 56 mL via INTRAVENOUS

## 2020-02-18 NOTE — ED Provider Notes (Signed)
[facility] EMERGENCY DEPARTMENT Provider Note   CSN: [id number] Arrival date & time: 02/17/20  1628   History Chief Complaint  Patient presents with  . Chest Pain  . Shortness of Breath    Lorraine Weber is a 40 y.o. female.  The history is provided by the patient.  Chest Pain Associated symptoms: shortness of breath   Shortness of Breath Associated symptoms: chest pain   She has history of hypertension, von Willebrand's disease, mitral valve prolapse, idiopathic intracranial hypertension, jugular vein stenosis and comes in because of chest pain and shortness of breath which started about 4 days ago and have been getting progressively worse.  Shortness of breath is exertional and now comes on with walking only about 20 or 30 feet.  She has had paroxysmal nocturnal dyspnea.  Chest pain is left-sided but not necessarily exertional.  She has difficulty characterizing it.  Pain does radiate to the left shoulder but not arm or back.  She had discussed her symptoms with her cardiologist who recommended she come to the ED for further evaluation.  Of note, she had a internal jugular stent placed and a vascular clip apparently had embolized to the right ventricle.  This was during a procedure in September.  Patient has noted some swelling in her legs.  She denies fever cough.  She denies recent travel or exogenous estrogen use.  She did have another internal jugular vein stent placed about 2 weeks ago.  Past Medical History:  Diagnosis Date  . Anemia   . Clotting disorder ([place])    Von Willebrand   . Concussion    x8, last 2010, residual homonymous Hemianopsia  . CSF leak    multiple serum pathes  . Frequent sinus infections    tx with flonase nasal spray  . GERD (gastroesophageal reflux disease)   . History of blood transfusion    over 5 yrs ago  . Hypertension   . Mastoiditis   . Migraines   . Nephrolithiasis    X5, Dr Jeffie Pollock, passed stones, no surgery  . Peptic  ulcer disease   . Von Willebrand disease ([place])    mild form per patient    Patient Active Problem List   Diagnosis Date Noted  . Encounter for therapeutic drug monitoring 09/28/2014  . CSF leak 05/10/2014  . Back ache 08/12/2013  . Cephalalgia 08/12/2013  . Cervical pain 08/12/2013  . Complicated migraine 40/09/6759  . Hemiplegia, unspecified, affecting nondominant side 01/02/2013  . Sebaceous cyst of right axilla 07/02/2012  . Atypical face pain 04/30/2012  . Dysfunction of eustachian tube 04/30/2012  . Arthralgia of temporomandibular joint 04/30/2012  . Chronic sinusitis 10/01/2011  . Candida vaginitis 08/22/2010  . Preseptal cellulitis 08/14/2010  . MRSA bacteremia 08/03/2010  . GERD 04/02/2010  . ANEMIA-NOS 11/08/2009  . Von Willebrand's disease ([place]) 11/08/2009  . PEPTIC ULCER DISEASE 11/08/2009  . Headache, post-traumatic 11/08/2009  . NEPHROLITHIASIS, HX OF 11/08/2009    Past Surgical History:  Procedure Laterality Date  . APPENDECTOMY    . CERVICAL FUSION  2018   C5-C7  . DILITATION & CURRETTAGE/HYSTROSCOPY WITH NOVASURE ABLATION N/A 08/23/2015   Procedure: DILATATION & CURETTAGE/HYSTEROSCOPY WITH NOVASURE ABLATION;  Surgeon: Vanessa Kick, MD;  Location: [facility];  Service: Gynecology;  Laterality: N/A;  . EXTRACORPOREAL SHOCK WAVE LITHOTRIPSY Left 03/19/2018   Procedure: EXTRACORPOREAL SHOCK WAVE LITHOTRIPSY (ESWL);  Surgeon: Bjorn Loser, MD;  Location: [facility];  Service: Urology;  Laterality: Left;  . HAND TENDON  SURGERY Left    left, complicated by MRSA  . LAMINECTOMY  21016   T10-T12  . spinal fluid leak patching     > 10X;[facility] - blood patching  . SPINAL FUSION  2017   T10-T11  . SPINAL FUSION  2018   T10-L1  . WISDOM TOOTH EXTRACTION       OB History   No obstetric history on file.     Family History  Problem Relation Age of Onset  . Arthritis Father   . Hyperlipidemia Father   . Diabetes Father   . Stroke Father        PTE post CVA  .  Kidney disease Father   . Prostate cancer Paternal Grandfather   . Hyperlipidemia Paternal Grandfather   . Diabetes Paternal Grandfather   . Heart disease Mother        ?hypertrophic idiopathic subaortic stenosis  . Heart disease Maternal Grandmother   . Diabetes Maternal Grandmother   . Heart disease Maternal Grandfather        HISS  . Heart disease Maternal Aunt        HISS  . Stroke Maternal Aunt 50  . Heart disease Maternal Uncle   . Heart disease Brother        HISS  . Other Other        EDS in M, bro & niece  . Stomach cancer Neg Hx   . Colon cancer Neg Hx   . Rectal cancer Neg Hx   . Ovarian cancer Neg Hx     Social History   Tobacco Use  . Smoking status: Former Smoker    Packs/day: 0.30    Years: 10.00    Pack years: 3.00    Types: Cigarettes  . Smokeless tobacco: Never Used  . Tobacco comment: smoked age 7-present, up to 1 ppd.05/24/14 1/2 ppd  Vaping Use  . Vaping Use: Never used  Substance Use Topics  . Alcohol use: No    Alcohol/week: 0.0 standard drinks  . Drug use: No    Home Medications Prior to Admission medications   Medication Sig Start Date End Date Taking? Authorizing Provider  aspirin 325 MG tablet Take 325 mg by mouth daily.    [provider]  clopidogrel (PLAVIX) 75 MG tablet Take 75 mg by mouth daily.    [provider]  Docusate Calcium (STOOL SOFTENER PO) Take 1 tablet by mouth daily.    [provider]  famotidine (PEPCID) 20 MG tablet Take 1 tablet (20 mg total) by mouth at bedtime. 09/21/19   Irene Shipper, MD  fluticasone [facility]) 50 MCG/ACT nasal spray Place 1 spray into both nostrils daily.    [provider]  gabapentin (NEURONTIN) 300 MG capsule Take 1 capsule by mouth 3 (three) times daily.    [provider]  losartan (COZAAR) 25 MG tablet Take 1 tablet (25 mg total) by mouth daily. 09/28/19 12/27/19  Croitoru, Mihai, MD  methocarbamol (ROBAXIN) 750 MG tablet Take 750 mg by mouth 4  (four) times daily. 08/09/19   [provider]  metoprolol succinate (TOPROL-XL) 50 MG 24 hr tablet Take 1 tablet (50 mg total) by mouth daily. Take with or immediately following a meal. 10/07/19   Croitoru, Mihai, MD  nortriptyline (PAMELOR) 50 MG capsule Take 50 mg by mouth 2 (two) times daily. 08/09/19   [provider]  omeprazole (PRILOSEC) 40 MG capsule Take 1 capsule (40 mg total) by mouth 2 (two) times daily.  09/21/19   Irene Shipper, MD  ondansetron (ZOFRAN ODT) 8 MG disintegrating tablet Take 1 tablet (8 mg total) by mouth every 8 (eight) hours as needed for nausea or vomiting. 08/16/19   Raylene Everts, MD  ondansetron (ZOFRAN) 4 MG tablet TAKE 1 TABLET EVERY 8 HOURS AS NEEDED FOR NAUSEA 01/08/17   [provider]  spironolactone (ALDACTONE) 25 MG tablet Take 25 mg by mouth 2 (two) times daily. 08/09/19   [provider]  tiZANidine (ZANAFLEX) 2 MG tablet Take 1-2 tablets by mouth every 8 (eight) hours as needed. 11/25/17   [provider]  traMADol (ULTRAM) 50 MG tablet TAKE 1 TO 2 TABLETS 4 TIMES A DAY AS NEEDED 11/25/17   [provider]  levonorgestrel-ethinyl estradiol (ALTAVERA) 0.15-30 MG-MCG per tablet Take 1 tablet by mouth daily.    04/29/11  [provider]    Allergies    Acetazolamide, Aspirin, Other, Topamax [topiramate], Levofloxacin, Levofloxacin in d5w, and Sulfamethoxazole-trimethoprim  Review of Systems   Review of Systems  Respiratory: Positive for shortness of breath.   Cardiovascular: Positive for chest pain.  All other systems reviewed and are negative.   Physical Exam Updated Vital Signs BP (!) 143/81   Pulse 96   Temp 98.2 F (36.8 C)   Resp 19   Ht 5\' 5"  (1.651 m)   Wt 60.3 kg   SpO2 100%   BMI 22.12 kg/m   Physical Exam Vitals and nursing note reviewed.   40 year old female, resting comfortably and in no acute distress. Vital signs are significant for borderline elevated blood  pressure.  Also, at the time of my exam, heart rate is 118 which is elevated. Oxygen saturation is 100%, which is normal. Head is normocephalic and atraumatic. PERRLA, EOMI. Oropharynx is clear. Neck is nontender and supple without adenopathy or JVD. Back is nontender and there is no CVA tenderness. Lungs are clear without rales, wheezes, or rhonchi. Chest is nontender. Heart has regular rate and rhythm without murmur. Abdomen is soft, flat, nontender without masses or hepatosplenomegaly and peristalsis is normoactive. Extremities have no cyanosis or edema, full range of motion is present. Skin is warm and dry without rash. Neurologic: Mental status is normal, cranial nerves are intact, there are no motor or sensory deficits.  ED Results / Procedures / Treatments   Labs (all labs ordered are listed, but only abnormal results are displayed) Labs Reviewed  BASIC METABOLIC PANEL - Abnormal; Notable for the following components:      Result Value   CO2 21 (*)    All other components within normal limits  CBC  BRAIN NATRIURETIC PEPTIDE  TROPONIN I (HIGH SENSITIVITY)  TROPONIN I (HIGH SENSITIVITY)    EKG EKG Interpretation  Date/Time:  Thursday February 17 2020 16:53:47 EST Ventricular Rate:  113 PR Interval:  152 QRS Duration: 82 QT Interval:  326 QTC Calculation: 447 R Axis:   58 Text Interpretation: Sinus tachycardia Otherwise normal ECG When compared with ECG of 08/07/2014, No significant change was found Confirmed by Delora Fuel ([id number]) on 02/18/2020 2:52:59 AM   Radiology DG Chest 2 View  Result Date: 02/17/2020 CLINICAL DATA:  Shortness of breath EXAM: CHEST - 2 VIEW COMPARISON:  07/21/2014 and prior exams FINDINGS: The cardiomediastinal silhouette is unremarkable. There is no evidence of focal airspace disease, pulmonary edema, suspicious pulmonary nodule/mass, pleural effusion, or pneumothorax. No acute bony abnormalities are identified. Spinal surgical hardware within the  cervical and thoracolumbar spine are noted.  IMPRESSION: No active cardiopulmonary disease. Electronically Signed   By: Margarette Canada M.D.   On: 02/17/2020 18:14   CT Angio Chest PE W and/or Wo Contrast  Result Date: 02/18/2020 CLINICAL DATA:  Pulmonary embolism suspected, high probability. EXAM: CT ANGIOGRAPHY CHEST WITH CONTRAST TECHNIQUE: Multidetector CT imaging of the chest was performed using the standard protocol during bolus administration of intravenous contrast. Multiplanar CT image reconstructions and MIPs were obtained to evaluate the vascular anatomy. CONTRAST:  5mL OMNIPAQUE IOHEXOL 350 MG/ML SOLN COMPARISON:  02/21/2016 FINDINGS: Cardiovascular: Normal heart size. No pericardial effusion. Age advanced atheromatous plaque of the aorta and also notably at the proximal left subclavian. There is a high-density within the free wall of the right ventricle, reportedly a known foreign body from prior procedure. No pulmonary artery filling defect. Mediastinum/Nodes: Negative for adenopathy or mass. Lungs/Pleura: There is no edema, consolidation, effusion, or pneumothorax. Upper Abdomen: At least 1 right and 2 left renal calculi measuring up to 5 mm at the left upper pole. No acute finding. Musculoskeletal: Lower cervical and upper lumbar fusions. No acute finding. Review of the MIP images confirms the above findings. IMPRESSION: 1. Negative for pulmonary embolism or other acute finding. 2. Age advanced atherosclerosis. 3. Nephrolithiasis. Electronically Signed   By: Monte Fantasia M.D.   On: 02/18/2020 07:08    Procedures Procedures   Medications Ordered in ED Medications  iohexol (OMNIPAQUE) 350 MG/ML injection 100 mL (56 mLs Intravenous Contrast Given 02/18/20 0656)    ED Course  I have reviewed the triage vital signs and the nursing notes.  Pertinent labs & imaging results that were available during my care of the patient were reviewed by me and considered in my medical decision making (see  chart for details).  MDM Rules/Calculators/A&P Chest pain and dyspnea of uncertain cause.  No fever or cough to suggest infectious etiology.  Consider cardiac causes of pain such as ACS.  ECG showed sinus tachycardia but no ST or T changes.  Chest x-ray showed no evidence of pneumonia.  Labs are normal including troponin x2.  Old records are reviewed confirming bilateral internal jugular vein stent placements with apparent embolization of a vascular clip during a procedure on 9/30.  Follow-up studies show it embedded within the right ventricle.  Her dyspnea and tachycardia and chest pain are certainly symptoms that could be associated with pulmonary embolism.  Also, consider possibility that the embolized clip could have worked free from the right ventricle and embolized to one of the pulmonary arteries.  This is not something that would be picked up with D-dimer testing, so she is sent for CT angiogram of the chest to rule out pulmonary embolism and rule out embolization of the vascular clip.  CT angiogram did not show any evidence of pulmonary embolism or device embolism.  Patient was reassured about these findings.  On reexam, heart rate was down into the 90s.  She has a follow-up appointment scheduled with her cardiologist in 2 weeks, which she is to keep.  Return precautions discussed.  Final Clinical Impression(s) / ED Diagnoses Final diagnoses:  Atypical chest pain  Shortness of breath    Rx / DC Orders ED Discharge Orders    None       Delora Fuel, MD [id number] [phone]

## 2020-02-18 NOTE — Discharge Instructions (Addendum)
Your evaluation here did not show any sign of anything seriously wrong with your heart or your lungs.  Please continue symptomatic treatment, and follow-up with your cardiologist as scheduled.  If symptoms are getting worse, please return to the emergency department for reevaluation.

## 2020-03-03 ENCOUNTER — Ambulatory Visit: Payer: Medicare Other | Admitting: Medical

## 2020-03-03 ENCOUNTER — Encounter: Payer: Self-pay | Admitting: Medical

## 2020-03-03 ENCOUNTER — Encounter: Payer: Self-pay | Admitting: Cardiovascular Disease

## 2020-03-03 ENCOUNTER — Other Ambulatory Visit: Payer: Self-pay

## 2020-03-03 VITALS — BP 124/78 | HR 85 | Ht 65.0 in | Wt 126.8 lb

## 2020-03-03 DIAGNOSIS — I10 Essential (primary) hypertension: Secondary | ICD-10-CM | POA: Diagnosis not present

## 2020-03-03 DIAGNOSIS — Z8249 Family history of ischemic heart disease and other diseases of the circulatory system: Secondary | ICD-10-CM | POA: Diagnosis not present

## 2020-03-03 DIAGNOSIS — Z0001 Encounter for general adult medical examination with abnormal findings: Secondary | ICD-10-CM | POA: Diagnosis not present

## 2020-03-03 DIAGNOSIS — R079 Chest pain, unspecified: Secondary | ICD-10-CM

## 2020-03-03 DIAGNOSIS — I739 Peripheral vascular disease, unspecified: Secondary | ICD-10-CM | POA: Diagnosis not present

## 2020-03-03 DIAGNOSIS — R0600 Dyspnea, unspecified: Secondary | ICD-10-CM | POA: Diagnosis not present

## 2020-03-03 DIAGNOSIS — Z681 Body mass index (BMI) 19 or less, adult: Secondary | ICD-10-CM | POA: Diagnosis not present

## 2020-03-03 DIAGNOSIS — W57XXXA Bitten or stung by nonvenomous insect and other nonvenomous arthropods, initial encounter: Secondary | ICD-10-CM | POA: Diagnosis not present

## 2020-03-03 DIAGNOSIS — Z1389 Encounter for screening for other disorder: Secondary | ICD-10-CM | POA: Diagnosis not present

## 2020-03-03 DIAGNOSIS — R0609 Other forms of dyspnea: Secondary | ICD-10-CM

## 2020-03-03 DIAGNOSIS — E063 Autoimmune thyroiditis: Secondary | ICD-10-CM | POA: Diagnosis not present

## 2020-03-03 DIAGNOSIS — Z0000 Encounter for general adult medical examination without abnormal findings: Secondary | ICD-10-CM | POA: Diagnosis not present

## 2020-03-03 DIAGNOSIS — E782 Mixed hyperlipidemia: Secondary | ICD-10-CM | POA: Diagnosis not present

## 2020-03-03 DIAGNOSIS — E039 Hypothyroidism, unspecified: Secondary | ICD-10-CM | POA: Diagnosis not present

## 2020-03-03 NOTE — Patient Instructions (Signed)
Medication Instructions:  No changes *If you need a refill on your cardiac medications before your next appointment, please call your pharmacy*   Lab Work: BMP If you have labs (blood work) drawn today and your tests are completely normal, you will receive your results only by:  [place] (if you have MyChart) OR  A paper copy in the mail If you have any lab test that is abnormal or we need to change your treatment, we will call you to review the results.   Testing/Procedures: [address] [place] has requested that you have an echocardiogram. Echocardiography is a painless test that uses sound waves to create images of your heart. It provides your doctor with information about the size and shape of your heart and how well your hearts chambers and valves are working. This procedure takes approximately one hour. There are no restrictions for this procedure.  [place] Your physician has requested that you have en exercise stress myoview. For further information please visit [email or web address]. Please follow instruction sheet, as given.  [place], [address] Your physician has requested that you have an ankle brachial index (ABI). During this test an ultrasound and blood pressure cuff are used to evaluate the arteries that supply the arms and legs with blood. Allow thirty minutes for this exam. There are no restrictions or special instructions.  Your physician has requested that you have a lower or upper extremity arterial duplex. This test is an ultrasound of the arteries in the legs or arms. It looks at arterial blood flow in the legs and arms. Allow one hour for Lower and Upper Arterial scans. There are no restrictions or special instructions  Follow-Up: At [facility], you and your health needs are our priority.  As part of our continuing mission to provide you with exceptional heart care, we have created designated  Provider Care Teams.  These Care Teams include your primary Cardiologist (physician) and Advanced Practice Providers (APPs -  Physician Assistants and Nurse Practitioners) who all work together to provide you with the care you need, when you need it.    Your next appointment:   2 month(s)  The format for your next appointment:   In Person  Provider:   Sanda Klein, MD

## 2020-03-03 NOTE — Progress Notes (Addendum)
Cardiology Office Note   Date:  03/06/2020   ID:  Lorraine Weber, DOB 11-Sep-1980, MRN [id number]  PCP:  Redmond School, MD  Cardiologist:  Sanda Klein, MD EP: None  Chief Complaint  Patient presents with  . Chest Pain    Patient stated is having chest pain for several weeks. Patient also stated having swelling with tingiling in feet and hands.      History of Present Illness: Lorraine Weber is a 40 y.o. female with a PMH of HTN, Ehlers-Danlos syndrome with vascular manifestations, Von Willebrands disease, idiopathic peripheral neuropathy, who presents for follow-up of chest pain and DOE.   She was recently seen in the ED 02/17/2020 for atypical chest pain and DOE. At that visit she reported progressive DOE, now occurring with walking 20-30 feet as well as orthopnea and PND. Also with non-exertional left sided chest pain radiating to the left shoulder. EKG reviewed and non-ischemic. HsTrop was negative x2. CXR showed no acute findings. CTA Chest showed no evidence of PE, device embolism, or dissection. She was recommended to follow-up outpatient with cardiology, for which this visit was scheduled.   She was last evaluated by cardiology at an outpatient visit with Dr. Sallyanne Kuster 08/2019 she was without significant cardiac complaints and had improvement in her blood pressure with substantial weight loss. No medication changes occurred and she was recommended to follow-up in 1 year. Following this visit, she underwent left internal jugular vein stenting 10/2019 for management of stenosis. Shortly thereafter she was found to have embolization of a Celt closure device to the right ventricle. She underwent a Coronary CTA which showed no calcifications in the coronary arteries and a metallic device in the right ventricle, trapped by the trabeculation of the right ventricle. No further intervention or work-up was recommended at that time. She subsequently underwent right internal jugular vein stenting  01/2020  Her last echocardiogram in 2016 showed EF 60-65%, no RWMA, mildly elevated PA pressures, mild MR, and mild AI. She was noted to have age advanced atheromatous plaque of the aorta and proximal left subclavian on CT scan 02/18/2020.   She presets today with her husband. She has been experiencing DOE for the past 3 weeks. She has noticed SOB occurring with less and less activity. She reports some orthopnea and PND, as well as LE edema. She also reports left sided chest pain which is mildly tender to palpation. Pain radiates to her left shoulder. Initially when this started a few weeks ago it was intermittent, now more of a constant pain. She has esophageal strictures which require intermittent dilations, though she states this chest pain is different than her typical GI symptoms when she is due for a dilation. She also describes intermittent episodes of her fingers turning white, then red, consistent with likely Raynauds phenomenon. She describes claudication in her lower legs. She states her feet are always cold and she occasionally had numbness/tingling in her feet. She denies significant palpitations.    Past Medical History:  Diagnosis Date  . Anemia   . Clotting disorder ([place])    Von Willebrand   . Concussion    x8, last 2010, residual homonymous Hemianopsia  . CSF leak    multiple serum pathes  . Frequent sinus infections    tx with flonase nasal spray  . GERD (gastroesophageal reflux disease)   . History of blood transfusion    over 5 yrs ago  . Hypertension   . Mastoiditis   . Migraines   .  Nephrolithiasis    X5, Dr Jeffie Pollock, passed stones, no surgery  . Peptic ulcer disease   . Von Willebrand disease ([place])    mild form per patient    Past Surgical History:  Procedure Laterality Date  . APPENDECTOMY    . CERVICAL FUSION  2018   C5-C7  . DILITATION & CURRETTAGE/HYSTROSCOPY WITH NOVASURE ABLATION N/A 08/23/2015   Procedure: DILATATION & CURETTAGE/HYSTEROSCOPY WITH NOVASURE  ABLATION;  Surgeon: Vanessa Kick, MD;  Location: [facility];  Service: Gynecology;  Laterality: N/A;  . EXTRACORPOREAL SHOCK WAVE LITHOTRIPSY Left 03/19/2018   Procedure: EXTRACORPOREAL SHOCK WAVE LITHOTRIPSY (ESWL);  Surgeon: Bjorn Loser, MD;  Location: [facility];  Service: Urology;  Laterality: Left;  . HAND TENDON SURGERY Left    left, complicated by MRSA  . LAMINECTOMY  21016   T10-T12  . spinal fluid leak patching     > 10X;[facility] - blood patching  . SPINAL FUSION  2017   T10-T11  . SPINAL FUSION  2018   T10-L1  . WISDOM TOOTH EXTRACTION       Current Outpatient Medications  Medication Sig Dispense Refill  . aspirin 325 MG tablet Take 325 mg by mouth daily.    . butalbital-acetaminophen-caffeine (FIORICET) 50-325-40 MG tablet Take 1 tablet by mouth every 6 (six) hours as needed for migraine.    . clopidogrel (PLAVIX) 75 MG tablet Take 75 mg by mouth daily.    [name] Calcium (STOOL SOFTENER PO) Take 1 tablet by mouth daily.    . famotidine (PEPCID) 20 MG tablet Take 1 tablet (20 mg total) by mouth at bedtime. 90 tablet 3  . fluticasone (FLONASE) 50 MCG/ACT nasal spray Place 1 spray into both nostrils daily.    Marland Kitchen gabapentin (NEURONTIN) 300 MG capsule Take 300 mg by mouth 3 (three) times daily.    Marland Kitchen losartan (COZAAR) 25 MG tablet Take by mouth.    . methocarbamol (ROBAXIN) 750 MG tablet Take 750 mg by mouth every 6 (six) hours as needed for muscle spasms.    . metoprolol succinate (TOPROL-XL) 50 MG 24 hr tablet Take 1 tablet (50 mg total) by mouth daily. Take with or immediately following a meal. (Patient taking differently: Take 50 mg by mouth at bedtime. Take with or immediately following a meal.) 90 tablet 3  . nortriptyline (PAMELOR) 50 MG capsule Take 50 mg by mouth 2 (two) times daily.    Marland Kitchen omeprazole (PRILOSEC) 40 MG capsule Take 1 capsule (40 mg total) by mouth 2 (two) times daily. 180 capsule 3  . ondansetron (ZOFRAN) 4 MG tablet Take by mouth.    . spironolactone (ALDACTONE)  25 MG tablet Take 25 mg by mouth at bedtime.    Marland Kitchen tiZANidine (ZANAFLEX) 2 MG tablet Take by mouth.    . topiramate (TOPAMAX) 25 MG tablet Take 25 mg by mouth 3 (three) times daily.    . traMADol (ULTRAM) 50 MG tablet Take 50-100 mg by mouth 4 (four) times daily as needed for moderate pain.  2   No current facility-administered medications for this visit.    Allergies:   Acetazolamide, Other, Levofloxacin, Levofloxacin in d5w, and Sulfamethoxazole-trimethoprim    Social History:  The patient  reports that she has quit smoking. Her smoking use included cigarettes. She has a 3.00 pack-year smoking history. She has never used smokeless tobacco. She reports that she does not drink alcohol and does not use drugs.   Family History:  The patient's family history includes Arthritis in her father;  Diabetes in her father, maternal grandmother, and paternal grandfather; Heart disease in her brother, maternal aunt, maternal grandfather, maternal grandmother, maternal uncle, and mother; Hyperlipidemia in her father and paternal grandfather; Kidney disease in her father; Other in an other family member; Prostate cancer in her paternal grandfather; Stroke in her father; Stroke (age of onset: 62) in her maternal aunt.    ROS:  Please see the history of present illness.   Otherwise, review of systems are positive for none.   All other systems are reviewed and negative.    PHYSICAL EXAM: VS:  BP 124/78   Pulse 85   Ht 5\' 5"  (1.651 m)   Wt 126 lb 12.8 oz (57.5 kg)   SpO2 99%   BMI 21.10 kg/m  , BMI Body mass index is 21.1 kg/m. GEN: Well nourished, well developed, in no acute distress HEENT: sclera anicteric Neck: no JVD, carotid bruits, or masses Cardiac: RRR; no murmurs, rubs, or gallops, mild non-pitting  edema  Respiratory:  clear to auscultation bilaterally, normal work of breathing GI: soft, nontender, nondistended, + BS Extremities: no deformity or atrophy, cool feet with normal pulses Skin:  warm and dry, no rash Neuro:  Strength and sensation are intact Psych: euthymic mood, full affect   EKG:  EKG is ordered today. The ekg ordered today demonstrates sinus rhythm with rate 85 bpm, no STE/D, no TWI, QTc 61   Recent Labs: 02/17/2020: Hemoglobin 12.7; Platelets 315 02/18/2020: B Natriuretic Peptide 18.4 03/03/2020: BUN 12; Creatinine, Ser 0.93; Potassium 4.8; Sodium 140    Lipid Panel    Component Value Date/Time   CHOL 128 06/28/2016 1531   TRIG 98 06/28/2016 1531   HDL 48 06/28/2016 1531   LDLCALC 60 06/28/2016 1531      Wt Readings from Last 3 Encounters:  03/03/20 126 lb 12.8 oz (57.5 kg)  02/18/20 130 lb (59 kg)  11/30/19 133 lb (60.3 kg)      Other studies Reviewed: Additional studies/ records that were reviewed today include:   Echocardiogram 2016: - Left ventricle: The cavity size was normal. Systolic function was  normal. The estimated ejection fraction was in the range of 60%  to 65%. Wall motion was normal; there were no regional wall  motion abnormalities. There was no evidence of elevated  ventricular filling pressure by Doppler parameters.  - Aortic valve: Trileaflet; mildly thickened leaflets. There was  mild regurgitation.  - Mitral valve: There was mild regurgitation.  - Pulmonary arteries: Systolic pressure was mildly increased. PA  peak pressure: 36 mm Hg (S).   Coronary CTA 01/2020: 1. No calcifications seen in the coronary arteries.  2. There is a metallic device in the right ventricle, trapped by the trabeculation of the right ventricle. Clinical correlation advised. Ordering physician notified.   ASSESSMENT AND PLAN:  1. DOE: patient describes progressive DOE with associated orthopnea, PND, and LE edema. On exam she appears euvolemic. She has no pitting edema. BNP was normal 02/17/20. CTA Chest was without PE or dissection. She had a limited echo 12/2019 with EF 55-60% with normal RV function/size. Prior to this her last  echo in 2016 showed EF 60-65%, no RWMA, mild AI, mild MR, and mildly elevated PA pressures.  - Will repeat a complete echocardiogram to evaluate LV function, valve function, aorta, and PA pressures  2. Chest pain in patient with significant family history of CAD: symptoms are atypical in that her chest pain is constant in nature and mildly tender to palpation. CTA  Chest was negative for PE earlier this month after onset of symptoms. Initial plan was to perform a NST to r/o ischemia, however on further review of Care Everywhere, she had a coronary CT[facility] 01/2020 [facility] to further evaluate device embolism to RV which was without coronary artery calcifications.  - Will check an echocardiogram as above - Will route to CMA to assist with cancelling scheduled NST. Patient called and updated to plan.   3. Claudication: she describes pain in her legs with walking, pallor on dependency, cold feet, and occasional tingling/numbness in her feet.  - Will check LEAs/ABI's   4. 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 No evidence of migration on CT chest 02/18/20. Doubtful this is contributing to her symptoms above  5. HTN: BP 124/78 today.  - Continue Losartan, metoprolol succinate, and spironolactone   6. 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 Followed by neurosurgery outpatient - Continue aspirin and plavix  7. Ehlers-Danlos Syndrome: reportedly mother has EDS with vascular component as well.   8. Valvulopathy: Patient reports history of MVP, though no appreciable murmur on exam and not mentioned on last echo in 2016. She did however have mild MR/AI on last echo - Will re-evaluate on echo     Current medicines are reviewed at length with the patient today.  The patient does not have concerns regarding medicines.  The following changes have been made:  As above  Labs/ tests ordered  today include:   Orders Placed This Encounter  Procedures  . Basic metabolic panel  . MYOCARDIAL PERFUSION IMAGING  . EKG 12-Lead  . ECHOCARDIOGRAM COMPLETE  . VAS [place] ABI WITH/WO TBI  . VAS [place] LOWER EXTREMITY ARTERIAL DUPLEX     Disposition:   FU with myself or Dr. Sallyanne Kuster in 2 months  Signed, Abigail Butts, PA-C  03/06/2020 3:07 PM

## 2020-03-04 LAB — BASIC METABOLIC PANEL
BUN/Creatinine Ratio: 13 (ref 9–23)
BUN: 12 mg/dL (ref 6–24)
CO2: 22 mmol/L (ref 20–29)
Calcium: 9.1 mg/dL (ref 8.7–10.2)
Chloride: 107 mmol/L — ABNORMAL HIGH (ref 96–106)
Creatinine, Ser: 0.93 mg/dL (ref 0.57–1.00)
GFR calc Af Amer: 88 mL/min/{1.73_m2} (ref >59)
GFR calc non Af Amer: 77 mL/min/{1.73_m2} (ref >59)
Glucose: 81 mg/dL (ref 65–99)
Potassium: 4.8 mmol/L (ref 3.5–5.2)
Sodium: 140 mmol/L (ref 134–144)

## 2020-03-06 NOTE — Progress Notes (Signed)
Thanks, KK. My suspicion is symptoms are noncardiac, but will follow up on the workup ordered.

## 2020-03-07 ENCOUNTER — Telehealth: Payer: Self-pay

## 2020-03-07 NOTE — Telephone Encounter (Signed)
Canceled appt per Cleon Dew 03-07-20  VB

## 2020-03-11 DIAGNOSIS — G894 Chronic pain syndrome: Secondary | ICD-10-CM | POA: Diagnosis not present

## 2020-03-11 DIAGNOSIS — M159 Polyosteoarthritis, unspecified: Secondary | ICD-10-CM | POA: Diagnosis not present

## 2020-03-11 DIAGNOSIS — J449 Chronic obstructive pulmonary disease, unspecified: Secondary | ICD-10-CM | POA: Diagnosis not present

## 2020-03-17 ENCOUNTER — Inpatient Hospital Stay (HOSPITAL_COMMUNITY): Admission: RE | Admit: 2020-03-17 | Payer: Medicare Other | Source: Ambulatory Visit

## 2020-03-17 DIAGNOSIS — Q796 Ehlers-Danlos syndrome, unspecified: Secondary | ICD-10-CM | POA: Diagnosis not present

## 2020-03-17 DIAGNOSIS — Z48812 Encounter for surgical aftercare following surgery on the circulatory system: Secondary | ICD-10-CM | POA: Diagnosis not present

## 2020-03-17 DIAGNOSIS — Z9862 Peripheral vascular angioplasty status: Secondary | ICD-10-CM | POA: Diagnosis not present

## 2020-03-17 DIAGNOSIS — G932 Benign intracranial hypertension: Secondary | ICD-10-CM | POA: Diagnosis not present

## 2020-03-22 DIAGNOSIS — G932 Benign intracranial hypertension: Secondary | ICD-10-CM | POA: Diagnosis not present

## 2020-03-22 DIAGNOSIS — R519 Headache, unspecified: Secondary | ICD-10-CM | POA: Diagnosis not present

## 2020-03-23 ENCOUNTER — Encounter (HOSPITAL_COMMUNITY): Payer: Self-pay | Admitting: Radiology

## 2020-03-23 ENCOUNTER — Other Ambulatory Visit: Payer: Self-pay

## 2020-03-23 ENCOUNTER — Ambulatory Visit (HOSPITAL_COMMUNITY): Payer: Medicare Other | Attending: Cardiology

## 2020-03-23 DIAGNOSIS — R0609 Other forms of dyspnea: Secondary | ICD-10-CM

## 2020-03-23 DIAGNOSIS — G9600 Cerebrospinal fluid leak, unspecified: Secondary | ICD-10-CM | POA: Insufficient documentation

## 2020-03-23 DIAGNOSIS — R079 Chest pain, unspecified: Secondary | ICD-10-CM | POA: Diagnosis not present

## 2020-03-23 DIAGNOSIS — Z8249 Family history of ischemic heart disease and other diseases of the circulatory system: Secondary | ICD-10-CM | POA: Diagnosis not present

## 2020-03-23 DIAGNOSIS — Z87891 Personal history of nicotine dependence: Secondary | ICD-10-CM | POA: Insufficient documentation

## 2020-03-23 DIAGNOSIS — R0600 Dyspnea, unspecified: Secondary | ICD-10-CM | POA: Diagnosis not present

## 2020-03-23 DIAGNOSIS — D680 Von Willebrand's disease: Secondary | ICD-10-CM | POA: Diagnosis not present

## 2020-03-23 DIAGNOSIS — I739 Peripheral vascular disease, unspecified: Secondary | ICD-10-CM | POA: Diagnosis not present

## 2020-03-23 DIAGNOSIS — I351 Nonrheumatic aortic (valve) insufficiency: Secondary | ICD-10-CM | POA: Insufficient documentation

## 2020-03-23 DIAGNOSIS — I10 Essential (primary) hypertension: Secondary | ICD-10-CM | POA: Insufficient documentation

## 2020-03-23 LAB — ECHOCARDIOGRAM COMPLETE
Area-P 1/2: 6.17 cm2
P 1/2 time: 399 msec
S' Lateral: 3.1 cm

## 2020-03-23 NOTE — Progress Notes (Signed)
Contacted reader, Dr. Radford Pax, regarding echocardiogram to be read.

## 2020-03-31 ENCOUNTER — Encounter (HOSPITAL_COMMUNITY): Payer: Medicare Other

## 2020-03-31 ENCOUNTER — Other Ambulatory Visit: Payer: Self-pay

## 2020-03-31 ENCOUNTER — Ambulatory Visit (HOSPITAL_COMMUNITY)
Admission: RE | Admit: 2020-03-31 | Discharge: 2020-03-31 | Disposition: A | Discharge status: Home / Self Care | Payer: Medicare Other | Source: Ambulatory Visit | Attending: Cardiology | Admitting: Cardiology

## 2020-03-31 DIAGNOSIS — R0600 Dyspnea, unspecified: Secondary | ICD-10-CM | POA: Insufficient documentation

## 2020-03-31 DIAGNOSIS — R0609 Other forms of dyspnea: Secondary | ICD-10-CM

## 2020-03-31 DIAGNOSIS — Z87891 Personal history of nicotine dependence: Secondary | ICD-10-CM | POA: Diagnosis not present

## 2020-03-31 DIAGNOSIS — Z8249 Family history of ischemic heart disease and other diseases of the circulatory system: Secondary | ICD-10-CM | POA: Diagnosis not present

## 2020-03-31 DIAGNOSIS — I739 Peripheral vascular disease, unspecified: Secondary | ICD-10-CM | POA: Diagnosis not present

## 2020-03-31 DIAGNOSIS — R079 Chest pain, unspecified: Secondary | ICD-10-CM

## 2020-04-07 DIAGNOSIS — I351 Nonrheumatic aortic (valve) insufficiency: Secondary | ICD-10-CM | POA: Diagnosis not present

## 2020-04-07 DIAGNOSIS — G932 Benign intracranial hypertension: Secondary | ICD-10-CM | POA: Diagnosis not present

## 2020-04-10 DIAGNOSIS — J449 Chronic obstructive pulmonary disease, unspecified: Secondary | ICD-10-CM | POA: Diagnosis not present

## 2020-04-10 DIAGNOSIS — M159 Polyosteoarthritis, unspecified: Secondary | ICD-10-CM | POA: Diagnosis not present

## 2020-04-10 DIAGNOSIS — G894 Chronic pain syndrome: Secondary | ICD-10-CM | POA: Diagnosis not present

## 2020-04-12 ENCOUNTER — Other Ambulatory Visit: Payer: Self-pay

## 2020-04-12 ENCOUNTER — Encounter: Payer: Self-pay | Admitting: Physician Assistant

## 2020-04-12 ENCOUNTER — Ambulatory Visit: Payer: Medicare Other | Admitting: Physician Assistant

## 2020-04-12 VITALS — BP 124/80 | HR 128 | Ht 63.75 in | Wt 127.4 lb

## 2020-04-12 DIAGNOSIS — R1310 Dysphagia, unspecified: Secondary | ICD-10-CM | POA: Diagnosis not present

## 2020-04-12 NOTE — Progress Notes (Signed)
Chief Complaint: Dysphagia  HPI:    Lorraine Weber is a 40 year old female, known to Dr. Henrene Pastor, with a past medical history as listed below including Erler's Danlos syndrome with vascular manifestations, including clotting disorder on Plavix, who was referred to me by Redmond School, MD for a complaint of dysphagia.      11/30/2019 patient saw Dr. Henrene Pastor for follow-up after endoscopy which was completed 09/30/2019 for dysphagia.  She was found to have whitish exudate throughout the esophagus and biopsies revealed esophagitis.  There is distal narrowing which was dilated with 52 [place] Maloney dilator.  She is continued on twice daily PPI and at night H2 receptor antagonist therapy.  Prescribed mostly control of her reflux symptoms but continues with intermittent dysphagia.  At times discussed she continue PPI and H2 blocker.  Was anticipated that she is going off Plavix at the end of the year and it was noted that if dysphagia persisted or worsen then could consider repeat dilation with larger dilator.    03/03/2020 patient saw cardiology and at that time described progressive dyspnea on exertion.  Also chest pain.  CTA was negative for PE earlier that month.    03/23/2020 echo with LVEF 65-70%.  She was noted to have mild to moderate aortic valve sclerosis.  No evidence of stenosis.    Today, the patient presents to clinic accompanied by her husband and explains that the cardiologist wants her to have an EGD for dilation prior to proceeding with TEE for further evaluation of heart issues.  She has been off of her Plavix and aspirin since February 2 and is hoping to get this procedure done before March 18.  Tells me that she has constant dysphagia which has been worsening over the past few months, chicken seems to be the worst thing.  Continues on her Omeprazole 40 twice a day and Pepcid at night.    Denies fever, chills, weight loss, abdominal pain, nausea or vomiting.  Past Medical History:  Diagnosis  Date  . Anemia   . Clotting disorder ([place])    Von Willebrand   . Concussion    x8, last 2010, residual homonymous Hemianopsia  . CSF leak    multiple serum pathes  . Frequent sinus infections    tx with flonase nasal spray  . GERD (gastroesophageal reflux disease)   . History of blood transfusion    over 5 yrs ago  . Hypertension   . Mastoiditis   . Migraines   . Nephrolithiasis    X5, Dr Jeffie Pollock, passed stones, no surgery  . Peptic ulcer disease   . Von Willebrand disease ([place])    mild form per patient    Past Surgical History:  Procedure Laterality Date  . APPENDECTOMY    . cerebral stent Bilateral    jugular vein  . CERVICAL FUSION  2018   C5-C7  . DILITATION & CURRETTAGE/HYSTROSCOPY WITH NOVASURE ABLATION N/A 08/23/2015   Procedure: DILATATION & CURETTAGE/HYSTEROSCOPY WITH NOVASURE ABLATION;  Surgeon: Vanessa Kick, MD;  Location: [facility];  Service: Gynecology;  Laterality: N/A;  . EXTRACORPOREAL SHOCK WAVE LITHOTRIPSY Left 03/19/2018   Procedure: EXTRACORPOREAL SHOCK WAVE LITHOTRIPSY (ESWL);  Surgeon: Bjorn Loser, MD;  Location: [facility];  Service: Urology;  Laterality: Left;  . HAND TENDON SURGERY Left    left, complicated by MRSA  . LAMINECTOMY  21016   T10-T12  . spinal fluid leak patching     > 10X;[facility] - blood patching  . SPINAL FUSION  2017  T10-T11  . SPINAL FUSION  2018   T10-L1  . WISDOM TOOTH EXTRACTION      Current Outpatient Medications  Medication Sig Dispense Refill  . butalbital-acetaminophen-caffeine (FIORICET) 50-325-40 MG tablet Take 1 tablet by mouth every 6 (six) hours as needed for migraine.    [name] Calcium (STOOL SOFTENER PO) Take 1 tablet by mouth daily.    . famotidine (PEPCID) 20 MG tablet Take 1 tablet (20 mg total) by mouth at bedtime. 90 tablet 3  . fluticasone (FLONASE) 50 MCG/ACT nasal spray Place 1 spray into both nostrils daily.    Marland Kitchen gabapentin (NEURONTIN) 300 MG capsule Take 300 mg by mouth 3 (three) times daily.    Marland Kitchen  losartan (COZAAR) 25 MG tablet Take by mouth.    . methocarbamol (ROBAXIN) 750 MG tablet Take 750 mg by mouth every 6 (six) hours as needed for muscle spasms.    . metoprolol succinate (TOPROL-XL) 50 MG 24 hr tablet Take 1 tablet (50 mg total) by mouth daily. Take with or immediately following a meal. (Patient taking differently: Take 50 mg by mouth at bedtime. Take with or immediately following a meal.) 90 tablet 3  . nortriptyline (PAMELOR) 50 MG capsule Take 50 mg by mouth 2 (two) times daily.    Marland Kitchen omeprazole (PRILOSEC) 40 MG capsule Take 1 capsule (40 mg total) by mouth 2 (two) times daily. 180 capsule 3  . ondansetron (ZOFRAN) 4 MG tablet Take by mouth.    . spironolactone (ALDACTONE) 25 MG tablet Take 25 mg by mouth at bedtime.    Marland Kitchen tiZANidine (ZANAFLEX) 2 MG tablet Take by mouth.    . topiramate (TOPAMAX) 25 MG tablet Take 25 mg by mouth 3 (three) times daily.    . traMADol (ULTRAM) 50 MG tablet Take 50-100 mg by mouth 4 (four) times daily as needed for moderate pain.  2   No current facility-administered medications for this visit.    Allergies as of 04/12/2020 - Review Complete 04/12/2020  Allergen Reaction Noted  . Acetazolamide Shortness Of Breath 12/31/2018  . Other Other (See Comments) 01/01/2013  . Levofloxacin Itching and Rash   . Levofloxacin in d5w Swelling and Rash 08/08/2015  . Sulfamethoxazole-trimethoprim Rash 01/17/2016    Family History  Problem Relation Age of Onset  . Arthritis Father   . Hyperlipidemia Father   . Diabetes Father   . Stroke Father        PTE post CVA  . Kidney disease Father   . Prostate cancer Paternal Grandfather   . Hyperlipidemia Paternal Grandfather   . Diabetes Paternal Grandfather   . Heart disease Mother        ?hypertrophic idiopathic subaortic stenosis  . Heart disease Maternal Grandmother   . Diabetes Maternal Grandmother   . Heart disease Maternal Grandfather        HISS  . Heart disease Maternal Aunt        HISS  .  Stroke Maternal Aunt 50  . Heart disease Maternal Uncle   . Heart disease Brother        HISS  . Other Other        EDS in M, bro & niece  . Stomach cancer Neg Hx   . Colon cancer Neg Hx   . Rectal cancer Neg Hx   . Ovarian cancer Neg Hx     Social History   Socioeconomic History  . Marital status: Married    Spouse name: Not on file  .  Number of children: 0  . Years of education: Not on file  . Highest education level: Not on file  Occupational History  . Occupation: [employer]    Employer: [employer]  . Occupation: disabled  Tobacco Use  . Smoking status: Former Smoker    Packs/day: 0.30    Years: 10.00    Pack years: 3.00    Types: Cigarettes  . Smokeless tobacco: Never Used  . Tobacco comment: smoked age 23-present, up to 1 ppd.05/24/14 1/2 ppd  Vaping Use  . Vaping Use: Never used  Substance and Sexual Activity  . Alcohol use: No    Alcohol/week: 0.0 standard drinks  . Drug use: No  . Sexual activity: Yes    Partners: Male    Birth control/protection: None    Comment: IUD removed 06/2015  Other Topics Concern  . Not on file  Social History Narrative  . Not on file   Social Determinants of Health   Financial Resource Strain: Not on file  Food Insecurity: Not on file  Transportation Needs: Not on file  Physical Activity: Not on file  Stress: Not on file  Social Connections: Not on file  Intimate Partner Violence: Not on file    Review of Systems:    Constitutional: No weight loss, fever or chills Cardiovascular: No chest pain Respiratory: No SOB  Gastrointestinal: See HPI and otherwise negative   Physical Exam:  Vital signs: BP 124/80 (BP Location: Left Arm, Patient Position: Sitting, Cuff Size: Normal)   Pulse (!) 128   Ht 5' 3.75" (1.619 m)   Wt 127 lb 6 oz (57.8 kg)   BMI 22.04 kg/m   Constitutional:   Pleasant Caucasian female appears to be in NAD, Well developed, Well nourished, alert and cooperative Respiratory:  Respirations even and unlabored. Lungs clear to auscultation bilaterally.   No wheezes, crackles, or rhonchi.  Cardiovascular: Normal S1, S2. No MRG. Regular rate and rhythm. No peripheral edema, cyanosis or pallor.  Gastrointestinal:  Soft, nondistended, nontender. No rebound or guarding. Normal bowel sounds. No appreciable masses or hepatomegaly. Psychiatric: Demonstrates good judgement and reason without abnormal affect or behaviors.  RELEVANT LABS AND IMAGING: CBC    Component Value Date/Time   WBC 5.2 02/17/2020 1719   RBC 4.42 02/17/2020 1719   HGB 12.7 02/17/2020 1719   HGB 13.7 03/15/2013 1345   HCT 39.3 02/17/2020 1719   HCT 41.2 03/15/2013 1345   PLT 315 02/17/2020 1719   PLT 290 03/15/2013 1345   MCV 88.9 02/17/2020 1719   MCV 88.2 03/15/2013 1345   MCH 28.7 02/17/2020 1719   MCHC 32.3 02/17/2020 1719   RDW 14.6 02/17/2020 1719   RDW 18.7 (H) 03/15/2013 1345   LYMPHSABS 2.1 09/16/2017 1709   LYMPHSABS 2.4 03/15/2013 1345   MONOABS 0.6 09/16/2017 1709   MONOABS 0.7 03/15/2013 1345   EOSABS 0.0 09/16/2017 1709   EOSABS 0.1 03/15/2013 1345   BASOSABS 0.0 09/16/2017 1709   BASOSABS 0.0 03/15/2013 1345    CMP     Component Value Date/Time   NA 140 03/03/2020 1500   NA 138 03/15/2013 1345   K 4.8 03/03/2020 1500   K 3.3 (L) 03/15/2013 1345   CL 107 (H) 03/03/2020 1500   CO2 22 03/03/2020 1500   CO2 26 03/15/2013 1345   GLUCOSE 81 03/03/2020 1500   GLUCOSE 97 02/17/2020 1719   GLUCOSE 97 03/15/2013 1345   BUN 12 03/03/2020 1500   BUN 6.8 (L) 03/15/2013  1345   CREATININE 0.93 03/03/2020 1500   CREATININE 0.67 02/16/2016 1219   CREATININE 0.7 03/15/2013 1345   CALCIUM 9.1 03/03/2020 1500   CALCIUM 9.3 03/15/2013 1345   PROT 7.1 03/09/2018 1532   PROT 7.1 03/15/2013 1345   ALBUMIN 4.2 03/09/2018 1532   ALBUMIN 4.0 03/15/2013 1345   AST 12 03/09/2018 1532   AST 17 03/15/2013 1345   ALT 7 03/09/2018 1532   ALT 13 03/15/2013 1345   ALKPHOS 101 03/09/2018  1532   ALKPHOS 112 03/15/2013 1345   BILITOT 0.3 03/09/2018 1532   BILITOT 0.27 03/15/2013 1345   GFRNONAA 77 03/03/2020 1500   GFRNONAA >60 02/17/2020 1719   GFRAA 88 03/03/2020 1500    Assessment: 1.  Dysphagia: Worsening dysphagia symptoms over the past 6 months; likely continued stricture  Plan: 1.  Patient is being worked up for worsening aortic insufficiency by the cardiology team.  They are wanting to proceed with TEE, but we were asked to do an EGD first due to history of stricture and worsening dysphagia.  (Apparently the physician sent Dr. Henrene Pastor note). 2.  Scheduled patient for an EGD with dilation in the [place] with Dr. Henrene Pastor. 3.  Patient is already holding her Plavix and aspirin. 4.  Patient to follow in clinic per recommendations from Dr. Henrene Pastor after time of procedure.  Ellouise Newer, PA-C [facility] 04/12/2020, 2:56 PM  Cc: Redmond School, MD

## 2020-04-12 NOTE — Progress Notes (Signed)
Assessment and plans reviewed  

## 2020-04-12 NOTE — Patient Instructions (Signed)
If you are age 42 or older, your body mass index should be between 23-30. Your Body mass index is 22.04 kg/m. If this is out of the aforementioned range listed, please consider follow up with your Primary Care Provider.  If you are age 69 or younger, your body mass index should be between 19-25. Your Body mass index is 22.04 kg/m. If this is out of the aformentioned range listed, please consider follow up with your Primary Care Provider.   You have been scheduled for an endoscopy. Please follow written instructions given to you at your visit today. If you use inhalers (even only as needed), please bring them with you on the day of your procedure.  Due to recent changes in healthcare laws, you may see the results of your imaging and laboratory studies on MyChart before your provider has had a chance to review them.  We understand that in some cases there may be results that are confusing or concerning to you. Not all laboratory results come back in the same time frame and the provider may be waiting for multiple results in order to interpret others.  Please give [place] 48 hours in order for your provider to thoroughly review all the results before contacting the office for clarification of your results.   Thank you for choosing me and [facility].  Ellouise Newer, PA-C

## 2020-04-18 ENCOUNTER — Ambulatory Visit (AMBULATORY_SURGERY_CENTER): Payer: Medicare Other | Admitting: Internal Medicine

## 2020-04-18 ENCOUNTER — Other Ambulatory Visit: Payer: Self-pay

## 2020-04-18 ENCOUNTER — Encounter: Payer: Self-pay | Admitting: Internal Medicine

## 2020-04-18 VITALS — BP 118/82 | HR 87 | Temp 97.5°F | Resp 14 | Ht 63.0 in | Wt 127.0 lb

## 2020-04-18 DIAGNOSIS — R1310 Dysphagia, unspecified: Secondary | ICD-10-CM | POA: Diagnosis not present

## 2020-04-18 DIAGNOSIS — K222 Esophageal obstruction: Secondary | ICD-10-CM | POA: Diagnosis not present

## 2020-04-18 DIAGNOSIS — K219 Gastro-esophageal reflux disease without esophagitis: Secondary | ICD-10-CM

## 2020-04-18 MED ORDER — SODIUM CHLORIDE 0.9 % IV SOLN: 500.0000 mL | Freq: Once | INTRAVENOUS | Status: DC

## 2020-04-18 NOTE — Progress Notes (Signed)
C.W. vital signs. 

## 2020-04-18 NOTE — Progress Notes (Signed)
PT taken to PACU. Monitors in place. VSS. Report given to RN. 

## 2020-04-18 NOTE — Progress Notes (Signed)
Called to room to assist during endoscopic procedure.  Patient ID and intended procedure confirmed with present staff. Received instructions for my participation in the procedure from the performing physician.  

## 2020-04-18 NOTE — Progress Notes (Signed)
Pt's states no medical or surgical changes since previsit or office visit. 

## 2020-04-18 NOTE — Patient Instructions (Signed)
Handout given on post dilation diet.    YOU HAD AN ENDOSCOPIC PROCEDURE TODAY: Refer to the procedure report and other information in the discharge instructions given to you for any specific questions about what was found during the examination. If this information does not answer your questions, please call [place] office at [phone] to clarify.   YOU SHOULD EXPECT: Some feelings of bloating in the abdomen. Passage of more gas than usual. Walking can help get rid of the air that was put into your GI tract during the procedure and reduce the bloating. If you had a lower endoscopy (such as a colonoscopy or flexible sigmoidoscopy) you may notice spotting of blood in your stool or on the toilet paper. Some abdominal soreness may be present for a day or two, also.  DIET: Your first meal following the procedure should be a light meal and then it is ok to progress to your normal diet. A half-sandwich or bowl of soup is an example of a good first meal. Heavy or fried foods are harder to digest and may make you feel nauseous or bloated. Drink plenty of fluids but you should avoid alcoholic beverages for 24 hours. If you had a esophageal dilation, please see attached instructions for diet.    ACTIVITY: Your care partner should take you home directly after the procedure. You should plan to take it easy, moving slowly for the rest of the day. You can resume normal activity the day after the procedure however YOU SHOULD NOT DRIVE, use power tools, machinery or perform tasks that involve climbing or major physical exertion for 24 hours (because of the sedation medicines used during the test).   SYMPTOMS TO REPORT IMMEDIATELY: A gastroenterologist can be reached at any hour. Please call [phone]  for any of the following symptoms:  . Following lower endoscopy (colonoscopy, flexible sigmoidoscopy) Excessive amounts of blood in the stool  Significant tenderness, worsening of abdominal pains  Swelling of the  abdomen that is new, acute  Fever of 100 or higher  . Following upper endoscopy (EGD, EUS, ERCP, esophageal dilation) Vomiting of blood or coffee ground material  New, significant abdominal pain  New, significant chest pain or pain under the shoulder blades  Painful or persistently difficult swallowing  New shortness of breath  Black, tarry-looking or red, bloody stools  FOLLOW UP:  If any biopsies were taken you will be contacted by phone or by letter within the next 1-3 weeks. Call [phone]  if you have not heard about the biopsies in 3 weeks.  Please also call with any specific questions about appointments or follow up tests.YOU HAD AN ENDOSCOPIC PROCEDURE TODAY: Refer to the procedure report and other information in the discharge instructions given to you for any specific questions about what was found during the examination. If this information does not answer your questions, please call [place] office at [phone] to clarify.   YOU SHOULD EXPECT: Some feelings of bloating in the abdomen. Passage of more gas than usual. Walking can help get rid of the air that was put into your GI tract during the procedure and reduce the bloating. If you had a lower endoscopy (such as a colonoscopy or flexible sigmoidoscopy) you may notice spotting of blood in your stool or on the toilet paper. Some abdominal soreness may be present for a day or two, also.  DIET: Your first meal following the procedure should be a light meal and then it is ok to progress to your normal diet.  A half-sandwich or bowl of soup is an example of a good first meal. Heavy or fried foods are harder to digest and may make you feel nauseous or bloated. Drink plenty of fluids but you should avoid alcoholic beverages for 24 hours. If you had a esophageal dilation, please see attached instructions for diet.    ACTIVITY: Your care partner should take you home directly after the procedure. You should plan to take it easy, moving  slowly for the rest of the day. You can resume normal activity the day after the procedure however YOU SHOULD NOT DRIVE, use power tools, machinery or perform tasks that involve climbing or major physical exertion for 24 hours (because of the sedation medicines used during the test).   SYMPTOMS TO REPORT IMMEDIATELY: A gastroenterologist can be reached at any hour. Please call [phone]  for any of the following symptoms:  . Following upper endoscopy (EGD, EUS, ERCP, esophageal dilation) Vomiting of blood or coffee ground material  New, significant abdominal pain  New, significant chest pain or pain under the shoulder blades  Painful or persistently difficult swallowing  New shortness of breath  Black, tarry-looking or red, bloody stools  FOLLOW UP:  If any biopsies were taken you will be contacted by phone or by letter within the next 1-3 weeks. Call [phone]  if you have not heard about the biopsies in 3 weeks.  Please also call with any specific questions about appointments or follow up tests.

## 2020-04-18 NOTE — Op Note (Signed)
[facility] Patient Name: Lorraine Weber Procedure Date: 04/18/2020 11:40 AM MRN: [id number] Endoscopist: Docia Chuck. Henrene Pastor , MD Age: 40 Referring MD:  Date of Birth: 09-23-1980 Gender: Female Account #: [id number] Procedure:                Upper GI endoscopy with esophageal dilation. 68                            French Indications:              Dysphagia, Therapeutic procedure. Previous                            dilations in 2019 and August 2021 with 6 French                            dilator. Initial dilation was quite helpful. Less                            durability with most recent dilation with                            complaints of intermittent solid food dysphagia,                            particularly meat. Anticipating TEE. Medicines:                Monitored Anesthesia Care Procedure:                Pre-Anesthesia Assessment:                           - Prior to the procedure, a History and Physical                            was performed, and patient medications and                            allergies were reviewed. The patient's tolerance of                            previous anesthesia was also reviewed. The risks                            and benefits of the procedure and the sedation                            options and risks were discussed with the patient.                            All questions were answered, and informed consent                            was obtained. Prior Anticoagulants: The patient has  taken Plavix (clopidogrel), last dose was more than                            28 days prior to procedure. ASA Grade Assessment:                            III - A patient with severe systemic disease. After                            reviewing the risks and benefits, the patient was                            deemed in satisfactory condition to undergo the                            procedure.                            After obtaining informed consent, the endoscope was                            passed under direct vision. Throughout the                            procedure, the patient's blood pressure, pulse, and                            oxygen saturations were monitored continuously. The                            Endoscope was introduced through the mouth, and                            advanced to the second part of duodenum. The upper                            GI endoscopy was accomplished without difficulty.                            The patient tolerated the procedure well. Scope In: Scope Out: Findings:                 One benign-appearing, intrinsic moderate stenosis                            was found 38 cm from the incisors. This stenosis                            measured 1.5 cm (inner diameter). After completing                            the endoscopic survey, the scope was withdrawn.  Dilation was performed with a Maloney dilator with                            no resistance at 40 Fr.                           The exam of the esophagus revealed subtle ringing                            of the midportion, large caliber. Previously noted                            exudative changes have resolved.                           The stomach was normal, save small hiatal hernia.                           The examined duodenum was normal.                           The cardia and gastric fundus were normal on                            retroflexion. Complications:            No immediate complications. Estimated Blood Loss:     Estimated blood loss: none. Impression:               - Benign-appearing esophageal stenosis. Dilated.                           - Normal stomach with small hiatal hernia.                           - Normal examined duodenum.                           - No specimens collected. Recommendation:           1. Patient has a contact number  available for                            emergencies. The signs and symptoms of potential                            delayed complications were discussed with the                            patient. Return to normal activities tomorrow.                            Written discharge instructions were provided to the                            patient.  2. Post dilation diet.                           3. Continue present medications.                           4. GI follow-up as needed Docia Chuck. Henrene Pastor, MD 04/18/2020 12:09:04 PM This report has been signed electronically.

## 2020-04-20 ENCOUNTER — Telehealth: Payer: Self-pay

## 2020-04-20 NOTE — Telephone Encounter (Signed)
  Follow up Call-  Call back number 04/18/2020 09/30/2019 01/27/2018 12/12/2017  Post procedure Call Back phone  # [phone]  Permission to leave phone message Yes Yes Yes Yes  Some recent data might be hidden     Patient questions:  Do you have a fever, pain , or abdominal swelling? No. Pain Score  0 *  Have you tolerated food without any problems? Yes.    Have you been able to return to your normal activities? Yes.    Do you have any questions about your discharge instructions: Diet   No. Medications  No. Follow up visit  No.  Do you have questions or concerns about your Care? No.  Actions: * If pain score is 4 or above: No action needed, pain <4.   1. Have you developed a fever since your procedure? No   2.   Have you had an respiratory symptoms (SOB or cough) since your procedure? No   3.   Have you tested positive for COVID 19 since your procedure? No   4.   Have you had any family members/close contacts diagnosed with the COVID 19 since your procedure?  No    If yes to any of these questions please route to Joylene John, RN and Joella Prince, RN

## 2020-04-24 ENCOUNTER — Telehealth: Payer: Self-pay | Admitting: Cardiovascular Disease

## 2020-04-24 MED ORDER — LOSARTAN POTASSIUM 25 MG PO TABS: 25.0000 mg | ORAL_TABLET | Freq: Every day | ORAL | 3 refills | Status: DC

## 2020-04-24 NOTE — Telephone Encounter (Signed)
*  STAT* If patient is at the pharmacy, call can be transferred to refill team.   1. Which medications need to be refilled? (please list name of each medication and dose if known)   losartan (COZAAR) 25 MG tablet    2. Which pharmacy/location (including street and city if local pharmacy) is medication to be sent to?  [facility] - [place], [place] - [address]    3. Do they need a 30 day or 90 day supply?  30 day  

## 2020-04-28 ENCOUNTER — Other Ambulatory Visit: Payer: Self-pay

## 2020-04-28 ENCOUNTER — Ambulatory Visit: Payer: Medicare Other | Admitting: Cardiovascular Disease

## 2020-04-28 ENCOUNTER — Encounter: Payer: Self-pay | Admitting: Cardiovascular Disease

## 2020-04-28 VITALS — BP 151/80 | HR 111 | Ht 64.0 in | Wt 128.8 lb

## 2020-04-28 DIAGNOSIS — R072 Precordial pain: Secondary | ICD-10-CM

## 2020-04-28 DIAGNOSIS — I10 Essential (primary) hypertension: Secondary | ICD-10-CM

## 2020-04-28 DIAGNOSIS — T82817D Embolism of cardiac prosthetic devices, implants and grafts, subsequent encounter: Secondary | ICD-10-CM

## 2020-04-28 DIAGNOSIS — I5032 Chronic diastolic (congestive) heart failure: Secondary | ICD-10-CM | POA: Diagnosis not present

## 2020-04-28 DIAGNOSIS — I351 Nonrheumatic aortic (valve) insufficiency: Secondary | ICD-10-CM

## 2020-04-28 DIAGNOSIS — M79605 Pain in left leg: Secondary | ICD-10-CM | POA: Diagnosis not present

## 2020-04-28 DIAGNOSIS — I871 Compression of vein: Secondary | ICD-10-CM

## 2020-04-28 DIAGNOSIS — M79604 Pain in right leg: Secondary | ICD-10-CM

## 2020-04-28 DIAGNOSIS — I341 Nonrheumatic mitral (valve) prolapse: Secondary | ICD-10-CM

## 2020-04-28 DIAGNOSIS — Q796 Ehlers-Danlos syndrome, unspecified: Secondary | ICD-10-CM | POA: Diagnosis not present

## 2020-04-28 MED ORDER — FUROSEMIDE 40 MG PO TABS: 40.0000 mg | ORAL_TABLET | Freq: Every day | ORAL | 1 refills | Status: DC

## 2020-04-28 MED ORDER — FUROSEMIDE 40 MG PO TABS: 40.0000 mg | ORAL_TABLET | Freq: Every day | ORAL | 3 refills | Status: DC

## 2020-04-28 NOTE — Patient Instructions (Signed)
Medication Instructions:  START Furosemide 40 mg once daily  *If you need a refill on your cardiac medications before your next appointment, please call your pharmacy*  Follow-Up: At [facility] you and your health needs are our priority.  As part of our continuing mission to provide you with exceptional heart care, we have created designated Provider Care Teams.  These Care Teams include your primary Cardiologist (physician) and Advanced Practice Providers (APPs -  Physician Assistants and Nurse Practitioners) who all work together to provide you with the care you need, when you need it.  We recommend signing up for the patient portal called "MyChart".  Sign up information is provided on this After Visit Summary.  MyChart is used to connect with patients for Virtual Visits (Telemedicine).  Patients are able to view lab/test results, encounter notes, upcoming appointments, etc.  Non-urgent messages can be sent to your provider as well.   To learn more about what you can do with MyChart, go to [email or web address].    Your next appointment:   1 month(s)  The format for your next appointment:   In Person  Provider:   Sanda Klein, MD   Other Instructions  You are scheduled for a TEE on 05/17/20 with Dr. Sallyanne Kuster.  Please arrive at the [facility] (Main Entrance A) at [facility]: [address] [place], [place] [address] at 10 am. (1 hour prior to procedure)  DIET: Nothing to eat or drink after midnight except a sip of water with medications (see medication instructions below)  Medication Instructions: Hold Furosemide and the spironolactone the morning of the procedure  Labs:  Your provider would like for you to return on March 31st or April 1st to have the following labs drawn: CBC and BMET. You do not need an appointment for the lab. Once in our office lobby there is a podium where you can sign in and ring the doorbell to alert [place] that you are here. The lab is open from  8:00 am to 4:30 pm; closed for lunch from 12:45pm-1:45pm.  You will need to have the coronavirus test completed prior to your procedure. An appointment has been made at 2:40 pm on 05/15/20. This is a Drive Up Visit at [address], [place],  [address]. Please tell them that you are there for procedure testing. Stay in your car and someone will be with you shortly. Please make sure to have all other labs completed before this test because you will need to stay quarantined until your procedure.   You must have a responsible person to drive you home and stay in the waiting area during your procedure. Failure to do so could result in cancellation.  Bring your insurance cards.  *Special Note: Every effort is made to have your procedure done on time. Occasionally there are emergencies that occur at the hospital that may cause delays. Please be patient if a delay does occur.

## 2020-04-30 NOTE — H&P (View-Only) (Signed)
Cardiology Office Note   Date:  04/30/2020   ID:  Lorraine Weber, DOB 1980/07/05, MRN [id number]  PCP:  Redmond School, MD  Cardiologist:  Sanda Klein, MD EP: None  No chief complaint on file.     History of Present Illness: Lorraine Weber is a 40 y.o. female with a PMH of HTN, Ehlers-Danlos syndrome with vascular manifestations, Von Willebrands disease, idiopathic peripheral neuropathy, who presents for follow-up of chest pain and DOE.   Over the last few months she developed exertional dyspnea, which has progressed to the point that it interferes with activities of self-care (NYHA functional class II-III).  Just over the last week or so she has been waking up short of breath several times a night, in a pattern suggestive of paroxysmal nocturnal dyspnea.  She is usually able to fall back asleep after about 30 minutes of being upright, but wakes up every 2 hours because of smothering.  She is also developed lower extremity edema and has also noticed that she has tachycardia at rest.  Emergency work-up in January showed normal cardiac troponins, normal chest x-ray, no evidence of pulmonary embolism on CT angiography.  There was also no evidence of aortic aneurysm or dissection.  She underwent left internal jugular vein stenting 10/2019 for management of stenosis causing elevated intracerebral pressure (bilateral stenosis in turn occurring close to the level of previous cervical spine surgery). Shortly thereafter she was found to have embolization of a Celt closure device to the right ventricle. She underwent a Coronary CTA which showed no calcifications in the coronary arteries and a metallic device in the right ventricle, trapped by the trabeculation of the right ventricle. No further intervention or work-up was recommended at that time. She subsequently underwent right internal jugular vein stenting 01/2020.  Her last echocardiogram in 2016 showed EF 60-65%, no RWMA, mildly elevated PA  pressures, mild MR, and mild AI.  Follow-up echocardiogram ordered in February 2022 shows moderate to severe aortic insufficiency, without aortic stenosis or clear evidence of enlargement of the aorta.  She was noted to have age advanced atheromatous plaque of the aorta and proximal left subclavian on CT scan 02/18/2020.   She was scheduled for transesophageal echocardiography to better evaluate the aortic valve, but first had to undergo esophagoplasty due to dysphagia and esophageal stenosis (04/18/2020).  She is swallowing much easier now, does not have any dysphagia or esophageal pain and has not had any bleeding complications.   Past Medical History:  Diagnosis Date  . Anemia   . Clotting disorder ([place])    Von Willebrand   . Concussion    x8, last 2010, residual homonymous Hemianopsia  . CSF leak    multiple serum pathes  . Frequent sinus infections    tx with flonase nasal spray  . GERD (gastroesophageal reflux disease)   . History of blood transfusion    over 5 yrs ago  . Hypertension   . Mastoiditis   . Migraines   . Nephrolithiasis    X5, Dr Jeffie Pollock, passed stones, no surgery  . Peptic ulcer disease   . Von Willebrand disease ([place])    mild form per patient    Past Surgical History:  Procedure Laterality Date  . APPENDECTOMY    . cerebral stent Bilateral    jugular vein  . CERVICAL FUSION  2018   C5-C7  . DILITATION & CURRETTAGE/HYSTROSCOPY WITH NOVASURE ABLATION N/A 08/23/2015   Procedure: DILATATION & CURETTAGE/HYSTEROSCOPY WITH NOVASURE ABLATION;  Surgeon:  Vanessa Kick, MD;  Location: [facility];  Service: Gynecology;  Laterality: N/A;  . EXTRACORPOREAL SHOCK WAVE LITHOTRIPSY Left 03/19/2018   Procedure: EXTRACORPOREAL SHOCK WAVE LITHOTRIPSY (ESWL);  Surgeon: Bjorn Loser, MD;  Location: [facility];  Service: Urology;  Laterality: Left;  . HAND TENDON SURGERY Left    left, complicated by MRSA  . LAMINECTOMY  21016   T10-T12  . spinal fluid leak patching     > 10X;[facility] -  blood patching  . SPINAL FUSION  2017   T10-T11  . SPINAL FUSION  2018   T10-L1  . WISDOM TOOTH EXTRACTION       Current Outpatient Medications  Medication Sig Dispense Refill  . butalbital-acetaminophen-caffeine (FIORICET) 50-325-40 MG tablet Take 1 tablet by mouth every 6 (six) hours as needed for migraine.    [name] Calcium (STOOL SOFTENER PO) Take 1 tablet by mouth daily.    . famotidine (PEPCID) 20 MG tablet Take 1 tablet (20 mg total) by mouth at bedtime. 90 tablet 3  . fluticasone (FLONASE) 50 MCG/ACT nasal spray Place 1 spray into both nostrils daily.    Marland Kitchen gabapentin (NEURONTIN) 300 MG capsule Take 300 mg by mouth 3 (three) times daily.    Marland Kitchen losartan (COZAAR) 25 MG tablet Take 1 tablet (25 mg total) by mouth daily. 90 tablet 3  . methocarbamol (ROBAXIN) 750 MG tablet Take 750 mg by mouth every 6 (six) hours as needed for muscle spasms.    . metoprolol succinate (TOPROL-XL) 50 MG 24 hr tablet Take 1 tablet (50 mg total) by mouth daily. Take with or immediately following a meal. (Patient taking differently: Take 50 mg by mouth at bedtime. Take with or immediately following a meal.) 90 tablet 3  . nortriptyline (PAMELOR) 50 MG capsule Take 50 mg by mouth 2 (two) times daily.    Marland Kitchen omeprazole (PRILOSEC) 40 MG capsule Take 1 capsule (40 mg total) by mouth 2 (two) times daily. 180 capsule 3  . ondansetron (ZOFRAN) 4 MG tablet Take by mouth.    . spironolactone (ALDACTONE) 25 MG tablet Take 25 mg by mouth at bedtime.    Marland Kitchen tiZANidine (ZANAFLEX) 2 MG tablet Take by mouth.    . topiramate (TOPAMAX) 25 MG tablet Take 25 mg by mouth 3 (three) times daily.    . traMADol (ULTRAM) 50 MG tablet Take 50-100 mg by mouth 4 (four) times daily as needed for moderate pain.  2  . furosemide (LASIX) 40 MG tablet Take 1 tablet (40 mg total) by mouth daily. 30 tablet 1   No current facility-administered medications for this visit.    Allergies:   Acetazolamide, Other, Levofloxacin, Levofloxacin in  d5w, and Sulfamethoxazole-trimethoprim    Social History:  The patient  reports that she has quit smoking. Her smoking use included cigarettes. She has a 3.00 pack-year smoking history. She has never used smokeless tobacco. She reports that she does not drink alcohol and does not use drugs.   Family History:  The patient's family history includes Arthritis in her father; Diabetes in her father, maternal grandmother, and paternal grandfather; Heart disease in her brother, maternal aunt, maternal grandfather, maternal grandmother, maternal uncle, and mother; Hyperlipidemia in her father and paternal grandfather; Kidney disease in her father; Other in an other family member; Prostate cancer in her paternal grandfather; Stroke in her father; Stroke (age of onset: 77) in her maternal aunt.    ROS:  Please see the history of present illness.   Otherwise, review of  systems are positive for none.   All other systems are reviewed and negative.    PHYSICAL EXAM: VS:  BP (!) 151/80   Pulse (!) 111   Ht 5\' 4"  (1.626 m)   Wt 128 lb 12.8 oz (58.4 kg)   SpO2 99%   BMI 22.11 kg/m  , BMI Body mass index is 22.11 kg/m.  General: Alert, oriented x3, no distress, appears well Head: no evidence of trauma, PERRL, EOMI, no exophtalmos or lid lag, no myxedema, no xanthelasma; normal ears, nose and oropharynx Neck: normal jugular venous pulsations and no hepatojugular reflux; brisk carotid pulses without delay and no carotid bruits Chest: clear to auscultation, no signs of consolidation by percussion or palpation, normal fremitus, symmetrical and full respiratory excursions Cardiovascular: normal position and quality of the apical impulse, regular rhythm, normal first and second heart sounds, no murmurs, rubs or gallops Abdomen: no tenderness or distention, no masses by palpation, no abnormal pulsatility or arterial bruits, normal bowel sounds, no hepatosplenomegaly Extremities: no clubbing, cyanosis or edema; 2+  radial, ulnar and brachial pulses bilaterally; 2+ right femoral, posterior tibial and dorsalis pedis pulses; 2+ left femoral, posterior tibial and dorsalis pedis pulses; no subclavian or femoral bruits Neurological: grossly nonfocal Psych: Normal mood and affect    EKG:  EKG is ordered today.  It shows sinus tachycardia and possible left atrial abnormality, borderline QTC 476 ms but is otherwise a normal tracing.    Recent Labs: 02/17/2020: Hemoglobin 12.7; Platelets 315 02/18/2020: B Natriuretic Peptide 18.4 03/03/2020: BUN 12; Creatinine, Ser 0.93; Potassium 4.8; Sodium 140    Lipid Panel    Component Value Date/Time   CHOL 128 06/28/2016 1531   TRIG 98 06/28/2016 1531   HDL 48 06/28/2016 1531   LDLCALC 60 06/28/2016 1531      Wt Readings from Last 3 Encounters:  04/28/20 128 lb 12.8 oz (58.4 kg)  04/18/20 127 lb (57.6 kg)  04/12/20 127 lb 6 oz (57.8 kg)      Other studies Reviewed: Additional studies/ records that were reviewed today include:   Echocardiogram 03/23/2020 1. Left ventricular ejection fraction, by estimation, is 65 to 70%. Left  ventricular ejection fraction by 3D volume is 68 %. The left ventricle has  normal function. The left ventricle has no regional wall motion  abnormalities. Left ventricular diastolic  parameters are consistent with Grade I diastolic dysfunction (impaired  relaxation). The average left ventricular global longitudinal strain is  -24.1 %. The global longitudinal strain is normal.  2. Right ventricular systolic function is normal. The right ventricular  size is normal. There is normal pulmonary artery systolic pressure. The  estimated right ventricular systolic pressure is 25.4 mmHg.  3. The mitral valve is normal in structure. Trivial mitral valve  regurgitation. No evidence of mitral stenosis.  4. The aortic valve is calcified. There is moderate calcification of the  aortic valve. There is moderate thickening of the aortic  valve. Aortic  valve regurgitation is moderate to severe. Aortic regurgitation PHT ranges  from 399to 270 msec. Mild to  moderate aortic valve sclerosis/calcification is present, without any  evidence of aortic stenosis.  5. The inferior vena cava is normal in size with greater than 50%  respiratory variability, suggesting right atrial pressure of 3 mmHg.  6. The patient has a hx of Ehler-Danlos syndrome with normal aortic root  and ascending aorta but the aortic arch and descending aorta appear  generous. Consider dedicated gated Chest CTA for further assessment.  7. Compared to prior echo, AI is now moderate to severe. Consider TEE for  further assessment.  Coronary CTA 01/2020: 1. No calcifications seen in the coronary arteries.  2. There is a metallic device in the right ventricle, trapped by the trabeculation of the right ventricle. Clinical correlation advised. Ordering physician notified.   ASSESSMENT AND PLAN:  1. CHF: She describes exertional dyspnea and PND consistent with left heart failure.  She has normal/hyperdynamic left ventricular systolic function and aortic insufficiency may be the culprit.   We will add furosemide, continue metoprolol, losartan and spironolactone.  Check labs before her TEE.  2. AI: With her history of connective tissue disorder, the most likely cause will be aorta annular ectasia, although this was not immediately obvious on her transthoracic echo.  We will schedule for transesophageal echocardiography.  Will allow about a month to pass from her esophagoplasty for full esophageal healing. This procedure has been fully reviewed with the patient and informed consent has been obtained.  3. Chest pain in patient with significant family history of CAD: Symptoms are not typical for exertional angina.  Consider AI-related angina.No evidence of pulmonary embolism or aortic dissection recent perform CTs.  Of course she is at risk for arterial dissection due to  Ehlers-Danlos syndrome, but neither her ECG nor cardiac enzymes performed atthe onset of her symptoms supports this diagnosis.  Coronary [facility]A 01/2020 at [facility] (to further evaluate device embolism to RV) was without coronary artery calcifications.   4. 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 No evidence of migration on CT chest 02/18/20.  Unlikely that any of her symptoms are due to this and suspect that by this point the device may be fully endothelialized.  5. HTN: BP 124/78 at her most recent office appointment in February, blood pressure a little higher today.  Suspect this will improve with furosemide.  We will recheck her blood pressure when she comes in for her TEE.  6. 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 Followed by neurosurgery outpatient - Continue aspirin and plavix  7. Ehlers-Danlos Syndrome: reportedly mother has EDS with vascular component as well.   8. MVP: Not reported on her most recent transthoracic echo.  Reevaluate by TEE.  9.  Leg pain: she describes pain in her legs with walking, pallor on dependency, cold feet, and occasional tingling/numbness in her feet, but lower extremity arterial ultrasound 03/31/2020 was negative for obstruction.  Possible she has Raynaud's syndrome, but suspect that her symptoms are more likely to be neurogenic.     Current medicines are reviewed at length with the patient today.  The patient does not have concerns regarding medicines.  The following changes have been made:  As above  Labs/ tests ordered today include:   Orders Placed This Encounter  Procedures  . Basic metabolic panel  . CBC  . EKG 12-Lead   Patient Instructions  Medication Instructions:  START Furosemide 40 mg once daily  *If you need a refill on your cardiac medications before your next appointment, please call your pharmacy*  Follow-Up: At [facility], you and your health needs are our priority.  As part of our continuing mission to provide you with exceptional heart care, we have created designated Provider Care Teams.  These Care Teams include your primary Cardiologist (physician) and Advanced Practice Providers (APPs -  Physician Assistants and Nurse Practitioners) who all work together to  provide you with the care you need, when you need it.  We recommend signing up for the patient portal called "MyChart".  Sign up information is provided on this After Visit Summary.  MyChart is used to connect with patients for Virtual Visits (Telemedicine).  Patients are able to view lab/test results, encounter notes, upcoming appointments, etc.  Non-urgent messages can be sent to your provider as well.   To learn more about what you can do with MyChart, go to [email or web address].    Your next appointment:   1 month(s)  The format for your next appointment:   In Person  Provider:   Sanda Klein, MD   Other Instructions  You are scheduled for a TEE on 05/17/20 with Dr. Sallyanne Kuster.  Please arrive at the [facility] (Main Entrance A) at [facility]: [address] [place], [place] [address] at 10 am. (1 hour prior to procedure)  DIET: Nothing to eat or drink after midnight except a sip of water with medications (see medication instructions below)  Medication Instructions: Hold Furosemide and the spironolactone the morning of the procedure  Labs:  Your provider would like for you to return on March 31st or April 1st to have the following labs drawn: CBC and BMET. You do not need an appointment for the lab. Once in our office lobby there is a podium where you can sign in and ring the doorbell to alert [place] that you are here. The lab is open from 8:00 am to 4:30 pm; closed for lunch from 12:45pm-1:45pm.  You will need to have the coronavirus test completed prior to your procedure. An appointment has been made at 2:40 pm on 05/15/20. This  is a Drive Up Visit at [address], [place], [place] [address]. Please tell them that you are there for procedure testing. Stay in your car and someone will be with you shortly. Please make sure to have all other labs completed before this test because you will need to stay quarantined until your procedure.   You must have a responsible person to drive you home and stay in the waiting area during your procedure. Failure to do so could result in cancellation.  Bring your insurance cards.  *Special Note: Every effort is made to have your procedure done on time. Occasionally there are emergencies that occur at the hospital that may cause delays. Please be patient if a delay does occur.       Signed, Sanda Klein, MD  04/30/2020 8:47 PM

## 2020-04-30 NOTE — Progress Notes (Signed)
Cardiology Office Note   Date:  04/30/2020   ID:  Lorraine Weber, DOB 1980/05/10, MRN [id number]  PCP:  Redmond School, MD  Cardiologist:  Sanda Klein, MD EP: None  No chief complaint on file.     History of Present Illness: Lorraine Weber is a 40 y.o. female with a PMH of HTN, Ehlers-Danlos syndrome with vascular manifestations, Von Willebrands disease, idiopathic peripheral neuropathy, who presents for follow-up of chest pain and DOE.   Over the last few months she developed exertional dyspnea, which has progressed to the point that it interferes with activities of self-care (NYHA functional class II-III).  Just over the last week or so she has been waking up short of breath several times a night, in a pattern suggestive of paroxysmal nocturnal dyspnea.  She is usually able to fall back asleep after about 30 minutes of being upright, but wakes up every 2 hours because of smothering.  She is also developed lower extremity edema and has also noticed that she has tachycardia at rest.  Emergency work-up in January showed normal cardiac troponins, normal chest x-ray, no evidence of pulmonary embolism on CT angiography.  There was also no evidence of aortic aneurysm or dissection.  She underwent left internal jugular vein stenting 10/2019 for management of stenosis causing elevated intracerebral pressure (bilateral stenosis in turn occurring close to the level of previous cervical spine surgery). Shortly thereafter she was found to have embolization of a Celt closure device to the right ventricle. She underwent a Coronary CTA which showed no calcifications in the coronary arteries and a metallic device in the right ventricle, trapped by the trabeculation of the right ventricle. No further intervention or work-up was recommended at that time. She subsequently underwent right internal jugular vein stenting 01/2020.  Her last echocardiogram in 2016 showed EF 60-65%, no RWMA, mildly elevated PA  pressures, mild MR, and mild AI.  Follow-up echocardiogram ordered in February 2022 shows moderate to severe aortic insufficiency, without aortic stenosis or clear evidence of enlargement of the aorta.  She was noted to have age advanced atheromatous plaque of the aorta and proximal left subclavian on CT scan 02/18/2020.   She was scheduled for transesophageal echocardiography to better evaluate the aortic valve, but first had to undergo esophagoplasty due to dysphagia and esophageal stenosis (04/18/2020).  She is swallowing much easier now, does not have any dysphagia or esophageal pain and has not had any bleeding complications.   Past Medical History:  Diagnosis Date  . Anemia   . Clotting disorder ([place])    Von Willebrand   . Concussion    x8, last 2010, residual homonymous Hemianopsia  . CSF leak    multiple serum pathes  . Frequent sinus infections    tx with flonase nasal spray  . GERD (gastroesophageal reflux disease)   . History of blood transfusion    over 5 yrs ago  . Hypertension   . Mastoiditis   . Migraines   . Nephrolithiasis    X5, Dr Jeffie Pollock, passed stones, no surgery  . Peptic ulcer disease   . Von Willebrand disease ([place])    mild form per patient    Past Surgical History:  Procedure Laterality Date  . APPENDECTOMY    . cerebral stent Bilateral    jugular vein  . CERVICAL FUSION  2018   C5-C7  . DILITATION & CURRETTAGE/HYSTROSCOPY WITH NOVASURE ABLATION N/A 08/23/2015   Procedure: DILATATION & CURETTAGE/HYSTEROSCOPY WITH NOVASURE ABLATION;  Surgeon:  Vanessa Kick, MD;  Location: [facility];  Service: Gynecology;  Laterality: N/A;  . EXTRACORPOREAL SHOCK WAVE LITHOTRIPSY Left 03/19/2018   Procedure: EXTRACORPOREAL SHOCK WAVE LITHOTRIPSY (ESWL);  Surgeon: Bjorn Loser, MD;  Location: [facility];  Service: Urology;  Laterality: Left;  . HAND TENDON SURGERY Left    left, complicated by MRSA  . LAMINECTOMY  21016   T10-T12  . spinal fluid leak patching     > 10X;[facility] -  blood patching  . SPINAL FUSION  2017   T10-T11  . SPINAL FUSION  2018   T10-L1  . WISDOM TOOTH EXTRACTION       Current Outpatient Medications  Medication Sig Dispense Refill  . butalbital-acetaminophen-caffeine (FIORICET) 50-325-40 MG tablet Take 1 tablet by mouth every 6 (six) hours as needed for migraine.    [name] Calcium (STOOL SOFTENER PO) Take 1 tablet by mouth daily.    . famotidine (PEPCID) 20 MG tablet Take 1 tablet (20 mg total) by mouth at bedtime. 90 tablet 3  . fluticasone (FLONASE) 50 MCG/ACT nasal spray Place 1 spray into both nostrils daily.    Marland Kitchen gabapentin (NEURONTIN) 300 MG capsule Take 300 mg by mouth 3 (three) times daily.    Marland Kitchen losartan (COZAAR) 25 MG tablet Take 1 tablet (25 mg total) by mouth daily. 90 tablet 3  . methocarbamol (ROBAXIN) 750 MG tablet Take 750 mg by mouth every 6 (six) hours as needed for muscle spasms.    . metoprolol succinate (TOPROL-XL) 50 MG 24 hr tablet Take 1 tablet (50 mg total) by mouth daily. Take with or immediately following a meal. (Patient taking differently: Take 50 mg by mouth at bedtime. Take with or immediately following a meal.) 90 tablet 3  . nortriptyline (PAMELOR) 50 MG capsule Take 50 mg by mouth 2 (two) times daily.    Marland Kitchen omeprazole (PRILOSEC) 40 MG capsule Take 1 capsule (40 mg total) by mouth 2 (two) times daily. 180 capsule 3  . ondansetron (ZOFRAN) 4 MG tablet Take by mouth.    . spironolactone (ALDACTONE) 25 MG tablet Take 25 mg by mouth at bedtime.    Marland Kitchen tiZANidine (ZANAFLEX) 2 MG tablet Take by mouth.    . topiramate (TOPAMAX) 25 MG tablet Take 25 mg by mouth 3 (three) times daily.    . traMADol (ULTRAM) 50 MG tablet Take 50-100 mg by mouth 4 (four) times daily as needed for moderate pain.  2  . furosemide (LASIX) 40 MG tablet Take 1 tablet (40 mg total) by mouth daily. 30 tablet 1   No current facility-administered medications for this visit.    Allergies:   Acetazolamide, Other, Levofloxacin, Levofloxacin in  d5w, and Sulfamethoxazole-trimethoprim    Social History:  The patient  reports that she has quit smoking. Her smoking use included cigarettes. She has a 3.00 pack-year smoking history. She has never used smokeless tobacco. She reports that she does not drink alcohol and does not use drugs.   Family History:  The patient's family history includes Arthritis in her father; Diabetes in her father, maternal grandmother, and paternal grandfather; Heart disease in her brother, maternal aunt, maternal grandfather, maternal grandmother, maternal uncle, and mother; Hyperlipidemia in her father and paternal grandfather; Kidney disease in her father; Other in an other family member; Prostate cancer in her paternal grandfather; Stroke in her father; Stroke (age of onset: 8) in her maternal aunt.    ROS:  Please see the history of present illness.   Otherwise, review of  systems are positive for none.   All other systems are reviewed and negative.    PHYSICAL EXAM: VS:  BP (!) 151/80   Pulse (!) 111   Ht 5\' 4"  (1.626 m)   Wt 128 lb 12.8 oz (58.4 kg)   SpO2 99%   BMI 22.11 kg/m  , BMI Body mass index is 22.11 kg/m.  General: Alert, oriented x3, no distress, appears well Head: no evidence of trauma, PERRL, EOMI, no exophtalmos or lid lag, no myxedema, no xanthelasma; normal ears, nose and oropharynx Neck: normal jugular venous pulsations and no hepatojugular reflux; brisk carotid pulses without delay and no carotid bruits Chest: clear to auscultation, no signs of consolidation by percussion or palpation, normal fremitus, symmetrical and full respiratory excursions Cardiovascular: normal position and quality of the apical impulse, regular rhythm, normal first and second heart sounds, no murmurs, rubs or gallops Abdomen: no tenderness or distention, no masses by palpation, no abnormal pulsatility or arterial bruits, normal bowel sounds, no hepatosplenomegaly Extremities: no clubbing, cyanosis or edema; 2+  radial, ulnar and brachial pulses bilaterally; 2+ right femoral, posterior tibial and dorsalis pedis pulses; 2+ left femoral, posterior tibial and dorsalis pedis pulses; no subclavian or femoral bruits Neurological: grossly nonfocal Psych: Normal mood and affect    EKG:  EKG is ordered today.  It shows sinus tachycardia and possible left atrial abnormality, borderline QTC 476 ms but is otherwise a normal tracing.    Recent Labs: 02/17/2020: Hemoglobin 12.7; Platelets 315 02/18/2020: B Natriuretic Peptide 18.4 03/03/2020: BUN 12; Creatinine, Ser 0.93; Potassium 4.8; Sodium 140    Lipid Panel    Component Value Date/Time   CHOL 128 06/28/2016 1531   TRIG 98 06/28/2016 1531   HDL 48 06/28/2016 1531   LDLCALC 60 06/28/2016 1531      Wt Readings from Last 3 Encounters:  04/28/20 128 lb 12.8 oz (58.4 kg)  04/18/20 127 lb (57.6 kg)  04/12/20 127 lb 6 oz (57.8 kg)      Other studies Reviewed: Additional studies/ records that were reviewed today include:   Echocardiogram 03/23/2020 1. Left ventricular ejection fraction, by estimation, is 65 to 70%. Left  ventricular ejection fraction by 3D volume is 68 %. The left ventricle has  normal function. The left ventricle has no regional wall motion  abnormalities. Left ventricular diastolic  parameters are consistent with Grade I diastolic dysfunction (impaired  relaxation). The average left ventricular global longitudinal strain is  -24.1 %. The global longitudinal strain is normal.  2. Right ventricular systolic function is normal. The right ventricular  size is normal. There is normal pulmonary artery systolic pressure. The  estimated right ventricular systolic pressure is 51.7 mmHg.  3. The mitral valve is normal in structure. Trivial mitral valve  regurgitation. No evidence of mitral stenosis.  4. The aortic valve is calcified. There is moderate calcification of the  aortic valve. There is moderate thickening of the aortic  valve. Aortic  valve regurgitation is moderate to severe. Aortic regurgitation PHT ranges  from 399to 270 msec. Mild to  moderate aortic valve sclerosis/calcification is present, without any  evidence of aortic stenosis.  5. The inferior vena cava is normal in size with greater than 50%  respiratory variability, suggesting right atrial pressure of 3 mmHg.  6. The patient has a hx of Ehler-Danlos syndrome with normal aortic root  and ascending aorta but the aortic arch and descending aorta appear  generous. Consider dedicated gated Chest CTA for further assessment.  7. Compared to prior echo, AI is now moderate to severe. Consider TEE for  further assessment.  Coronary CTA 01/2020: 1. No calcifications seen in the coronary arteries.  2. There is a metallic device in the right ventricle, trapped by the trabeculation of the right ventricle. Clinical correlation advised. Ordering physician notified.   ASSESSMENT AND PLAN:  1. CHF: She describes exertional dyspnea and PND consistent with left heart failure.  She has normal/hyperdynamic left ventricular systolic function and aortic insufficiency may be the culprit.   We will add furosemide, continue metoprolol, losartan and spironolactone.  Check labs before her TEE.  2. AI: With her history of connective tissue disorder, the most likely cause will be aorta annular ectasia, although this was not immediately obvious on her transthoracic echo.  We will schedule for transesophageal echocardiography.  Will allow about a month to pass from her esophagoplasty for full esophageal healing. This procedure has been fully reviewed with the patient and informed consent has been obtained.  3. Chest pain in patient with significant family history of CAD: Symptoms are not typical for exertional angina.  Consider AI-related angina.No evidence of pulmonary embolism or aortic dissection recent perform CTs.  Of course she is at risk for arterial dissection due to  Ehlers-Danlos syndrome, but neither her ECG nor cardiac enzymes performed atthe onset of her symptoms supports this diagnosis.  Coronary CTA 01/2020 at [facility] (to further evaluate device embolism to RV) was without coronary artery calcifications.   4. 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 No evidence of migration on CT chest 02/18/20.  Unlikely that any of her symptoms are due to this and suspect that by this point the device may be fully endothelialized.  5. HTN: BP 124/78 at her most recent office appointment in February, blood pressure a little higher today.  Suspect this will improve with furosemide.  We will recheck her blood pressure when she comes in for her TEE.  6. 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 Followed by neurosurgery outpatient - Continue aspirin and plavix  7. Ehlers-Danlos Syndrome: reportedly mother has EDS with vascular component as well.   8. MVP: Not reported on her most recent transthoracic echo.  Reevaluate by TEE.  9.  Leg pain: she describes pain in her legs with walking, pallor on dependency, cold feet, and occasional tingling/numbness in her feet, but lower extremity arterial ultrasound 03/31/2020 was negative for obstruction.  Possible she has Raynaud's syndrome, but suspect that her symptoms are more likely to be neurogenic.     Current medicines are reviewed at length with the patient today.  The patient does not have concerns regarding medicines.  The following changes have been made:  As above  Labs/ tests ordered today include:   Orders Placed This Encounter  Procedures  . Basic metabolic panel  . CBC  . EKG 12-Lead   Patient Instructions  Medication Instructions:  START Furosemide 40 mg once daily  *If you need a refill on your cardiac medications before your next appointment, please call your pharmacy*  Follow-Up: At [facility], you and your health needs are our priority.  As part of our continuing mission to provide you with exceptional heart care, we have created designated Provider Care Teams.  These Care Teams include your primary Cardiologist (physician) and Advanced Practice Providers (APPs -  Physician Assistants and Nurse Practitioners) who all work together to  provide you with the care you need, when you need it.  We recommend signing up for the patient portal called "MyChart".  Sign up information is provided on this After Visit Summary.  MyChart is used to connect with patients for Virtual Visits (Telemedicine).  Patients are able to view lab/test results, encounter notes, upcoming appointments, etc.  Non-urgent messages can be sent to your provider as well.   To learn more about what you can do with MyChart, go to [email or web address].    Your next appointment:   1 month(s)  The format for your next appointment:   In Person  Provider:   Sanda Klein, MD   Other Instructions  You are scheduled for a TEE on 05/17/20 with Dr. Sallyanne Kuster.  Please arrive at the [facility] (Main Entrance A) at [facility]: [address] [place], [place] [address] at 10 am. (1 hour prior to procedure)  DIET: Nothing to eat or drink after midnight except a sip of water with medications (see medication instructions below)  Medication Instructions: Hold Furosemide and the spironolactone the morning of the procedure  Labs:  Your provider would like for you to return on March 31st or April 1st to have the following labs drawn: CBC and BMET. You do not need an appointment for the lab. Once in our office lobby there is a podium where you can sign in and ring the doorbell to alert [place] that you are here. The lab is open from 8:00 am to 4:30 pm; closed for lunch from 12:45pm-1:45pm.  You will need to have the coronavirus test completed prior to your procedure. An appointment has been made at 2:40 pm on 05/15/20. This  is a Drive Up Visit at [address], [place], [place] [address]. Please tell them that you are there for procedure testing. Stay in your car and someone will be with you shortly. Please make sure to have all other labs completed before this test because you will need to stay quarantined until your procedure.   You must have a responsible person to drive you home and stay in the waiting area during your procedure. Failure to do so could result in cancellation.  Bring your insurance cards.  *Special Note: Every effort is made to have your procedure done on time. Occasionally there are emergencies that occur at the hospital that may cause delays. Please be patient if a delay does occur.       Signed, Sanda Klein, MD  04/30/2020 8:47 PM

## 2020-05-10 DIAGNOSIS — M159 Polyosteoarthritis, unspecified: Secondary | ICD-10-CM | POA: Diagnosis not present

## 2020-05-10 DIAGNOSIS — J449 Chronic obstructive pulmonary disease, unspecified: Secondary | ICD-10-CM | POA: Diagnosis not present

## 2020-05-12 DIAGNOSIS — I10 Essential (primary) hypertension: Secondary | ICD-10-CM | POA: Diagnosis not present

## 2020-05-12 DIAGNOSIS — I341 Nonrheumatic mitral (valve) prolapse: Secondary | ICD-10-CM | POA: Diagnosis not present

## 2020-05-13 LAB — BASIC METABOLIC PANEL
BUN/Creatinine Ratio: 13 (ref 9–23)
BUN: 11 mg/dL (ref 6–24)
CO2: 20 mmol/L (ref 20–29)
Calcium: 9 mg/dL (ref 8.7–10.2)
Chloride: 105 mmol/L (ref 96–106)
Creatinine, Ser: 0.82 mg/dL (ref 0.57–1.00)
Glucose: 89 mg/dL (ref 65–99)
Potassium: 4.8 mmol/L (ref 3.5–5.2)
Sodium: 141 mmol/L (ref 134–144)
eGFR: 92 mL/min/{1.73_m2} (ref >59)

## 2020-05-13 LAB — CBC
Hematocrit: 38.4 % (ref 34.0–46.6)
Hemoglobin: 12.8 g/dL (ref 11.1–15.9)
MCH: 28.4 pg (ref 26.6–33.0)
MCHC: 33.3 g/dL (ref 31.5–35.7)
MCV: 85 fL (ref 79–97)
Platelets: 311 10*3/uL (ref 150–450)
RBC: 4.5 x10E6/uL (ref 3.77–5.28)
RDW: 14.2 % (ref 11.7–15.4)
WBC: 6.2 10*3/uL (ref 3.4–10.8)

## 2020-05-15 ENCOUNTER — Other Ambulatory Visit (HOSPITAL_COMMUNITY)
Admission: RE | Admit: 2020-05-15 | Discharge: 2020-05-15 | Disposition: A | Discharge status: Home / Self Care | Payer: Medicare Other | Source: Ambulatory Visit | Attending: Cardiovascular Disease | Admitting: Cardiovascular Disease

## 2020-05-15 DIAGNOSIS — Z20822 Contact with and (suspected) exposure to covid-19: Secondary | ICD-10-CM | POA: Diagnosis not present

## 2020-05-15 DIAGNOSIS — Z01812 Encounter for preprocedural laboratory examination: Secondary | ICD-10-CM | POA: Insufficient documentation

## 2020-05-15 LAB — SARS CORONAVIRUS 2 (TAT 6-24 HRS): SARS Coronavirus 2: NEGATIVE

## 2020-05-16 ENCOUNTER — Other Ambulatory Visit: Payer: Self-pay | Admitting: *Deleted

## 2020-05-16 DIAGNOSIS — I351 Nonrheumatic aortic (valve) insufficiency: Secondary | ICD-10-CM

## 2020-05-17 ENCOUNTER — Other Ambulatory Visit: Payer: Self-pay

## 2020-05-17 ENCOUNTER — Encounter (HOSPITAL_COMMUNITY)
Admission: RE | Disposition: A | Discharge status: Home / Self Care | Payer: Self-pay | Source: Home / Self Care | Attending: Cardiovascular Disease

## 2020-05-17 ENCOUNTER — Ambulatory Visit (HOSPITAL_COMMUNITY): Payer: Medicare Other | Admitting: Certified Registered"

## 2020-05-17 ENCOUNTER — Ambulatory Visit (HOSPITAL_BASED_OUTPATIENT_CLINIC_OR_DEPARTMENT_OTHER): Payer: Medicare Other

## 2020-05-17 ENCOUNTER — Ambulatory Visit (HOSPITAL_COMMUNITY)
Admission: RE | Admit: 2020-05-17 | Discharge: 2020-05-17 | Disposition: A | Discharge status: Home / Self Care | Payer: Medicare Other | Attending: Cardiovascular Disease | Admitting: Cardiovascular Disease

## 2020-05-17 DIAGNOSIS — R0609 Other forms of dyspnea: Secondary | ICD-10-CM | POA: Insufficient documentation

## 2020-05-17 DIAGNOSIS — R079 Chest pain, unspecified: Secondary | ICD-10-CM | POA: Diagnosis not present

## 2020-05-17 DIAGNOSIS — M79606 Pain in leg, unspecified: Secondary | ICD-10-CM | POA: Insufficient documentation

## 2020-05-17 DIAGNOSIS — I110 Hypertensive heart disease with heart failure: Secondary | ICD-10-CM | POA: Diagnosis not present

## 2020-05-17 DIAGNOSIS — Q796 Ehlers-Danlos syndrome, unspecified: Secondary | ICD-10-CM | POA: Insufficient documentation

## 2020-05-17 DIAGNOSIS — I351 Nonrheumatic aortic (valve) insufficiency: Secondary | ICD-10-CM | POA: Insufficient documentation

## 2020-05-17 DIAGNOSIS — R202 Paresthesia of skin: Secondary | ICD-10-CM | POA: Diagnosis not present

## 2020-05-17 DIAGNOSIS — I509 Heart failure, unspecified: Secondary | ICD-10-CM | POA: Insufficient documentation

## 2020-05-17 DIAGNOSIS — R231 Pallor: Secondary | ICD-10-CM | POA: Insufficient documentation

## 2020-05-17 DIAGNOSIS — Z79899 Other long term (current) drug therapy: Secondary | ICD-10-CM | POA: Insufficient documentation

## 2020-05-17 DIAGNOSIS — Z8249 Family history of ischemic heart disease and other diseases of the circulatory system: Secondary | ICD-10-CM | POA: Insufficient documentation

## 2020-05-17 DIAGNOSIS — Z881 Allergy status to other antibiotic agents status: Secondary | ICD-10-CM | POA: Insufficient documentation

## 2020-05-17 DIAGNOSIS — I083 Combined rheumatic disorders of mitral, aortic and tricuspid valves: Secondary | ICD-10-CM | POA: Diagnosis not present

## 2020-05-17 DIAGNOSIS — Z87891 Personal history of nicotine dependence: Secondary | ICD-10-CM | POA: Diagnosis not present

## 2020-05-17 DIAGNOSIS — R200 Anesthesia of skin: Secondary | ICD-10-CM | POA: Diagnosis not present

## 2020-05-17 DIAGNOSIS — K219 Gastro-esophageal reflux disease without esophagitis: Secondary | ICD-10-CM | POA: Diagnosis not present

## 2020-05-17 DIAGNOSIS — D649 Anemia, unspecified: Secondary | ICD-10-CM | POA: Diagnosis not present

## 2020-05-17 DIAGNOSIS — I10 Essential (primary) hypertension: Secondary | ICD-10-CM | POA: Diagnosis not present

## 2020-05-17 HISTORY — PX: TEE WITHOUT CARDIOVERSION: SHX5443

## 2020-05-17 LAB — ECHO TEE
AV Vena cont: 0.43 cm
P 1/2 time: 296 msec
S' Lateral: 2.89 cm

## 2020-05-17 LAB — SEDIMENTATION RATE: Sed Rate: 1 mm/hr (ref 0–22)

## 2020-05-17 SURGERY — ECHOCARDIOGRAM, TRANSESOPHAGEAL
Anesthesia: Monitor Anesthesia Care

## 2020-05-17 MED ORDER — ONDANSETRON HCL 4 MG/2ML IJ SOLN
INTRAMUSCULAR | Status: DC | PRN
  Administered 2020-05-17: 4 mg via INTRAVENOUS

## 2020-05-17 MED ORDER — LOSARTAN POTASSIUM 50 MG PO TABS: 50.0000 mg | ORAL_TABLET | Freq: Every day | ORAL | 3 refills | Status: DC

## 2020-05-17 MED ORDER — PROPOFOL 500 MG/50ML IV EMUL
INTRAVENOUS | Status: DC | PRN
  Administered 2020-05-17: 125 ug/kg/min via INTRAVENOUS

## 2020-05-17 MED ORDER — SODIUM CHLORIDE 0.9 % IV SOLN: INTRAVENOUS | Status: DC

## 2020-05-17 NOTE — Transfer of Care (Signed)
Immediate Anesthesia Transfer of Care Note  Patient: Lorraine Weber  Procedure(s) Performed: TRANSESOPHAGEAL ECHOCARDIOGRAM (TEE) (N/A )  Patient Location: Endoscopy Unit  Anesthesia Type:MAC  Level of Consciousness: awake, alert  and oriented  Airway & Oxygen Therapy: Patient connected to nasal cannula oxygen  Post-op Assessment: Post -op Vital signs reviewed and stable  Post vital signs: stable  Last Vitals:  Vitals Value Taken Time  BP    Temp    Pulse    Resp    SpO2      Last Pain:  Vitals:   05/17/20 0943  TempSrc: Oral  PainSc: 0-No pain         Complications: No complications documented.

## 2020-05-17 NOTE — Discharge Instructions (Signed)
Transesophageal Echocardiogram Transesophageal echocardiogram (TEE) is a test that uses sound waves to take pictures of your heart. TEE is done by passing a small probe attached to a flexible tube down the part of the body that moves food from your mouth to your stomach (esophagus). The pictures give clear images of your heart. This can help your doctor see if there are problems with your heart. Tell a doctor about:  Any allergies you have.  All medicines you are taking. This includes vitamins, herbs, eye drops, creams, and over-the-counter medicines.  Any problems you or family members have had with anesthetic medicines.  Any blood disorders you have.  Any surgeries you have had.  Any medical conditions you have.  Any swallowing problems.  Whether you have or have had a blockage in the part of the body that moves food from your mouth to your stomach.  Whether you are pregnant or may be pregnant. What are the risks? In general, this is a safe procedure. But, problems may occur, such as:  Damage to nearby structures or organs.  A tear in the part of the body that moves food from your mouth to your stomach.  Irregular heartbeat.  Hoarse voice or trouble swallowing.  Bleeding. What happens before the procedure? Medicines  Ask your doctor about changing or stopping: ? Your normal medicines. ? Vitamins, herbs, and supplements. ? Over-the-counter medicines.  Do not take aspirin or ibuprofen unless you are told to. General instructions  Follow instructions from your doctor about what you cannot eat or drink.  You will take out any dentures or dental retainers.  Plan to have a responsible adult take you home from the hospital or clinic.  Plan to have a responsible adult care for you for the time you are told after you leave the hospital or clinic. This is important. What happens during the procedure?  An IV will be put into one of your veins.  You may be given: ? A  sedative. This medicine helps you relax. ? A medicine to numb the back of your throat. This may be sprayed or gargled.  Your blood pressure, heart rate, and breathing will be watched.  You may be asked to lie on your left side.  A bite block will be placed in your mouth. This keeps you from biting the tube.  The tip of the probe will be placed into the back of your mouth.  You will be asked to swallow.  Your doctor will take pictures of your heart.  The probe and bite block will be taken out after the test is done. The procedure may vary among doctors and hospitals.   What can I expect after the procedure?  You will be monitored until you leave the hospital or clinic. This includes checking your blood pressure, heart rate, breathing rate, and blood oxygen level.  Your throat may feel sore and numb. This will get better over time. You will not be allowed to eat or drink until the numbness has gone away.  It is common to have a sore throat for a day or two.  It is up to you to get the results of your procedure. Ask how to get your results when they are ready. Follow these instructions at home:  If you were given a sedative during your procedure, do not drive or use machines until your doctor says that it is safe.  Return to your normal activities when your doctor says that it is safe.    Keep all follow-up visits. Summary  TEE is a test that uses sound waves to take pictures of your heart.  You will be given a medicine to help you relax.  Do not drive or use machines until your doctor says that it is safe. This information is not intended to replace advice given to you by your health care provider. Make sure you discuss any questions you have with your health care provider. Document Revised: 09/21/2019 Document Reviewed: 09/21/2019 Elsevier Patient Education  2021 Elsevier Inc.  

## 2020-05-17 NOTE — Progress Notes (Signed)
  Echocardiogram Echocardiogram Transesophageal has been performed.  Lorraine Weber 05/17/2020, 11:09 AM

## 2020-05-17 NOTE — Anesthesia Procedure Notes (Signed)
Procedure Name: MAC Date/Time: 05/17/2020 10:29 AM Performed by: Lavell Luster, CRNA Pre-anesthesia Checklist: Patient identified, Emergency Drugs available, Suction available, Timeout performed and Patient being monitored Patient Re-evaluated:Patient Re-evaluated prior to induction Oxygen Delivery Method: Nasal cannula Preoxygenation: Pre-oxygenation with 100% oxygen Induction Type: IV induction Placement Confirmation: breath sounds checked- equal and bilateral and positive ETCO2 Dental Injury: Teeth and Oropharynx as per pre-operative assessment

## 2020-05-17 NOTE — Interval H&P Note (Signed)
History and Physical Interval Note:  05/17/2020 9:33 AM  Lorraine Weber  has presented today for surgery, with the diagnosis of AORTIC INSUFFCIENCY.  The various methods of treatment have been discussed with the patient and family. After consideration of risks, benefits and other options for treatment, the patient has consented to  Procedure(s): TRANSESOPHAGEAL ECHOCARDIOGRAM (TEE) (N/A) as a surgical intervention.  The patient's history has been reviewed, patient examined, no change in status, stable for surgery.  I have reviewed the patient's chart and labs.  Questions were answered to the patient's satisfaction.     Daivon Rayos

## 2020-05-17 NOTE — Op Note (Signed)
INDICATIONS: aortic insufficiency  PROCEDURE:   Informed consent was obtained prior to the procedure. The risks, benefits and alternatives for the procedure were discussed and the patient comprehended these risks.  Risks include, but are not limited to, cough, sore throat, vomiting, nausea, somnolence, esophageal and stomach trauma or perforation, bleeding, low blood pressure, aspiration, pneumonia, infection, trauma to the teeth and death.    After a procedural time-out, the oropharynx was anesthetized with 20% benzocaine spray.   During this procedure the patient was administered IV propofol by Anesthesiology.  The transesophageal probe was inserted in the esophagus and stomach without difficulty and multiple views were obtained.  The patient was kept under observation until the patient left the procedure room.  The patient left the procedure room in stable condition.   Agitated microbubble saline contrast was not administered.  COMPLICATIONS:    There were no immediate complications.  FINDINGS:  Normal LV size and function. Normal caliber aorta, all segments. Trileaflet aortic valve with prominent fixed sessile echodensities seen in the mid portion of all three cusps (healed bacterial vegetations? Nonbacterial thrombotic endocarditis?). Moderate central aortic insufficiency.  RECOMMENDATIONS:     Check ANA and anticardiolipin antibodies. Monitor AI with transthoracic echo.  Time Spent Directly with the Patient:  30 minutes   Ananda Caya 05/17/2020, 10:38 AM

## 2020-05-17 NOTE — Anesthesia Postprocedure Evaluation (Signed)
Anesthesia Post Note  Patient: LOREDA SILVERIO  Procedure(s) Performed: TRANSESOPHAGEAL ECHOCARDIOGRAM (TEE) (N/A )     Patient location during evaluation: PACU Anesthesia Type: MAC Level of consciousness: awake and alert Pain management: pain level controlled Vital Signs Assessment: post-procedure vital signs reviewed and stable Respiratory status: spontaneous breathing, nonlabored ventilation, respiratory function stable and patient connected to nasal cannula oxygen Cardiovascular status: stable and blood pressure returned to baseline Postop Assessment: no apparent nausea or vomiting Anesthetic complications: no   No complications documented.  Last Vitals:  Vitals:   05/17/20 1050 05/17/20 1100  BP: 133/80 (!) 145/82  Pulse: 83 82  Resp: (!) 21 15  Temp:    SpO2: 100% 100%    Last Pain:  Vitals:   05/17/20 1100  TempSrc:   PainSc: 0-No pain                 Novah Goza S

## 2020-05-17 NOTE — Anesthesia Preprocedure Evaluation (Addendum)
Anesthesia Evaluation  Patient identified by MRN, date of birth, ID band Patient awake  General Assessment Comment:Ehlers-Danlos syndrome with vascular manifestations, Von Willebrands disease,  Reviewed: Allergy & Precautions, NPO status , Patient's Chart, lab work & pertinent test results  Airway Mallampati: II  TM Distance: >3 FB Neck ROM: Full    Dental no notable dental hx.    Pulmonary neg pulmonary ROS, former smoker,    Pulmonary exam normal breath sounds clear to auscultation       Cardiovascular hypertension, + Valvular Problems/Murmurs AI  Rhythm:Regular Rate:Tachycardia + Systolic murmurs    Neuro/Psych negative neurological ROS  negative psych ROS   GI/Hepatic Neg liver ROS, GERD  ,  Endo/Other  negative endocrine ROS  Renal/GU negative Renal ROS  negative genitourinary   Musculoskeletal negative musculoskeletal ROS (+)   Abdominal   Peds negative pediatric ROS (+)  Hematology  (+) anemia ,   Anesthesia Other Findings   Reproductive/Obstetrics negative OB ROS                            Anesthesia Physical Anesthesia Plan  ASA: III  Anesthesia Plan: MAC   Post-op Pain Management:    Induction: Intravenous  PONV Risk Score and Plan: 2 and Propofol infusion and Treatment may vary due to age or medical condition  Airway Management Planned: Simple Face Mask  Additional Equipment:   Intra-op Plan:   Post-operative Plan:   Informed Consent: I have reviewed the patients History and Physical, chart, labs and discussed the procedure including the risks, benefits and alternatives for the proposed anesthesia with the patient or authorized representative who has indicated his/her understanding and acceptance.     Dental advisory given  Plan Discussed with: CRNA and Surgeon  Anesthesia Plan Comments:         Anesthesia Quick Evaluation

## 2020-05-18 ENCOUNTER — Encounter (HOSPITAL_COMMUNITY): Payer: Self-pay | Admitting: Cardiovascular Disease

## 2020-05-18 LAB — ENA+DNA/DS+ANTICH+CENTRO+JO...
Anti JO-1: <0.2 AI (ref 0.0–0.9)
Centromere Ab Screen: <0.2 AI (ref 0.0–0.9)
Chromatin Ab SerPl-aCnc: <0.2 AI (ref 0.0–0.9)
ENA SM Ab Ser-aCnc: <0.2 AI (ref 0.0–0.9)
Ribonucleic Protein: 0.2 AI (ref 0.0–0.9)
SSA (Ro) (ENA) Antibody, IgG: 6.5 AI — ABNORMAL HIGH (ref 0.0–0.9)
SSB (La) (ENA) Antibody, IgG: <0.2 AI (ref 0.0–0.9)
Scleroderma (Scl-70) (ENA) Antibody, IgG: <0.2 AI (ref 0.0–0.9)
ds DNA Ab: 3 IU/mL (ref 0–9)

## 2020-05-18 LAB — ANA W/REFLEX IF POSITIVE: Anti Nuclear Antibody (ANA): POSITIVE — AB

## 2020-05-20 LAB — CARDIOLIPIN ANTIBODIES, IGM+IGG
Anticardiolipin IgG: <9 GPL U/mL (ref 0–14)
Anticardiolipin IgM: 15 MPL U/mL — ABNORMAL HIGH (ref 0–12)

## 2020-05-22 ENCOUNTER — Telehealth: Payer: Self-pay | Admitting: Cardiovascular Disease

## 2020-05-22 ENCOUNTER — Other Ambulatory Visit: Payer: Self-pay | Admitting: *Deleted

## 2020-05-22 DIAGNOSIS — R768 Other specified abnormal immunological findings in serum: Secondary | ICD-10-CM

## 2020-05-22 NOTE — Telephone Encounter (Signed)
*  STAT* If patient is at the pharmacy, call can be transferred to refill team.   1. Which medications need to be refilled? (please list name of each medication and dose if known) losartan (COZAAR) 50 MG tablet  2. Which pharmacy/location (including street and city if local pharmacy) is medication to be sent to? [facility] - [place], [place] - [place] [address]  3. Do they need a 30 day or 90 day supply? 90 day   Cynthia from the Chronic Care Team states the patient is requesting her refill be sent to [facility] not [facility].

## 2020-05-23 ENCOUNTER — Ambulatory Visit: Payer: Medicare Other | Admitting: Cardiovascular Disease

## 2020-05-23 ENCOUNTER — Other Ambulatory Visit: Payer: Self-pay

## 2020-05-23 ENCOUNTER — Encounter: Payer: Self-pay | Admitting: Cardiovascular Disease

## 2020-05-23 VITALS — BP 148/96 | HR 129 | Ht 64.0 in | Wt 125.0 lb

## 2020-05-23 DIAGNOSIS — Z862 Personal history of diseases of the blood and blood-forming organs and certain disorders involving the immune mechanism: Secondary | ICD-10-CM | POA: Diagnosis not present

## 2020-05-23 DIAGNOSIS — I10 Essential (primary) hypertension: Secondary | ICD-10-CM

## 2020-05-23 DIAGNOSIS — I871 Compression of vein: Secondary | ICD-10-CM

## 2020-05-23 DIAGNOSIS — I5031 Acute diastolic (congestive) heart failure: Secondary | ICD-10-CM

## 2020-05-23 DIAGNOSIS — I208 Other forms of angina pectoris: Secondary | ICD-10-CM | POA: Diagnosis not present

## 2020-05-23 DIAGNOSIS — Z8679 Personal history of other diseases of the circulatory system: Secondary | ICD-10-CM | POA: Diagnosis not present

## 2020-05-23 DIAGNOSIS — Q796 Ehlers-Danlos syndrome, unspecified: Secondary | ICD-10-CM | POA: Diagnosis not present

## 2020-05-23 DIAGNOSIS — T82817D Embolism of cardiac prosthetic devices, implants and grafts, subsequent encounter: Secondary | ICD-10-CM

## 2020-05-23 DIAGNOSIS — I351 Nonrheumatic aortic (valve) insufficiency: Secondary | ICD-10-CM | POA: Diagnosis not present

## 2020-05-23 MED ORDER — LOSARTAN POTASSIUM 100 MG PO TABS: 100.0000 mg | ORAL_TABLET | Freq: Every day | ORAL | 3 refills | Status: DC

## 2020-05-23 NOTE — Patient Instructions (Signed)
Medication Instructions:  INCREASE the Losartan to 100 mg once daily  *If you need a refill on your cardiac medications before your next appointment, please call your pharmacy*   Lab Work: None ordered If you have labs (blood work) drawn today and your tests are completely normal, you will receive your results only by: Marland Kitchen MyChart Message (if you have MyChart) OR . A paper copy in the mail If you have any lab test that is abnormal or we need to change your treatment, we will call you to review the results.   Testing/Procedures: None ordered   Follow-Up: At [facility], you and your health needs are our priority.  As part of our continuing mission to provide you with exceptional heart care, we have created designated Provider Care Teams.  These Care Teams include your primary Cardiologist (physician) and Advanced Practice Providers (APPs -  Physician Assistants and Nurse Practitioners) who all work together to provide you with the care you need, when you need it.  We recommend signing up for the patient portal called "MyChart".  Sign up information is provided on this After Visit Summary.  MyChart is used to connect with patients for Virtual Visits (Telemedicine).  Patients are able to view lab/test results, encounter notes, upcoming appointments, etc.  Non-urgent messages can be sent to your provider as well.   To learn more about what you can do with MyChart, go to [email or web address].    Your next appointment:   4 month(s)  The format for your next appointment:   In Person  Provider:   Sanda Klein, MD   Other Instructions A referral has been placed to [facility] with Dr. Mali Hughes

## 2020-05-23 NOTE — Progress Notes (Signed)
Cardiology Office Note   Date:  05/26/2020   ID:  [name], DOB 06/01/1980, MRN [id number]  PCP:  Redmond School, MD  Cardiologist:  Sanda Klein, MD EP: None  Chief Complaint  Patient presents with  . Chest Pain  . Follow-up  . Headache  . Shortness of Breath  . Edema      History of Present Illness: Lorraine Weber is a 40 y.o. female with a PMH of HTN, Ehlers-Danlos syndrome with vascular manifestations, Von Willebrands disease, idiopathic peripheral neuropathy, who presents for follow-up of chest pain and DOE.   Over the last few months she has developed NYHA functional class II-3 exertional dyspnea that even interferes with activities of self-care.  She has had a few episodes suggestive of paroxysmal nocturnal dyspnea.  She has had some lower extremity edema and has developed a resting tachycardia.  Emergency work-up in January showed normal cardiac troponins, normal chest x-ray, no evidence of pulmonary embolism on CT angiography.  There was also no evidence of aortic aneurysm or dissection.  Transthoracic echocardiogram showed moderate to severe aortic insufficiency, substantially worse compared with mild AI in 2016.  There was no aortic root enlargement or aortic stenosis.  There was normal left ventricular systolic function with EF 60-65%.  She underwent transesophageal echocardiogram on 05/17/2020.  Again, surprisingly, this showed that her aortic insufficiency is not related to any aortic pathology, as would be expected with her Ehlers-Danlos syndrome.  Instead, she has a central jet of aortic insufficiency related to echodensities that suggest healed vegetation on all 3 aortic valve leaflets.  The appearance was vaguely suggestive of Libman-Sacks endocarditis so we sent off screening tests for autoimmune disease.  ANA was positive with a striking increase in SSA (Ro) antibodies, but the ESR was only 1 suggesting that there is no active ongoing inflammation.   Anticardiolipin antibodies were negative.  Her platelet count is normal and she is not anemic.  She does have a history of severe illness with Staph aureus bacteremia in 2006.  She had a PICC line and received antibiotics intravenously for several weeks.  She underwent left internal jugular vein stenting 10/2019 for management of stenosis causing elevated intracerebral pressure (bilateral stenosis in turn occurring close to the level of previous cervical spine surgery). Shortly thereafter she was found to have embolization of a Celt closure device to the right ventricle. She underwent a Coronary CTA which showed no calcifications in the coronary arteries and a metallic device in the right ventricle, trapped by the trabeculation of the right ventricle. No further intervention or work-up was recommended at that time. She subsequently underwent right internal jugular vein stenting 01/2020.  She was noted to have age advanced atheromatous plaque of the aorta and proximal left subclavian on CT scan 02/18/2020.  The same surprising finding was noted on her TEE.  Before undergoing her TEE, she first had to undergo esophagoplasty due to dysphagia and esophageal stenosis (04/18/2020).  She is swallowing much easier now, does not have any dysphagia or esophageal pain and has not had any bleeding complications.   Past Medical History:  Diagnosis Date  . Anemia   . Clotting disorder ([place])    Von Willebrand   . Concussion    x8, last 2010, residual homonymous Hemianopsia  . CSF leak    multiple serum pathes  . Frequent sinus infections    tx with flonase nasal spray  . GERD (gastroesophageal reflux disease)   . History of blood  transfusion    over 5 yrs ago  . Hypertension   . Mastoiditis   . Migraines   . Nephrolithiasis    X5, Dr Jeffie Pollock, passed stones, no surgery  . Peptic ulcer disease   . Von Willebrand disease ([place])    mild form per patient    Past Surgical History:  Procedure Laterality Date   . APPENDECTOMY    . cerebral stent Bilateral    jugular vein  . CERVICAL FUSION  2018   C5-C7  . DILITATION & CURRETTAGE/HYSTROSCOPY WITH NOVASURE ABLATION N/A 08/23/2015   Procedure: DILATATION & CURETTAGE/HYSTEROSCOPY WITH NOVASURE ABLATION;  Surgeon: Vanessa Kick, MD;  Location: [facility];  Service: Gynecology;  Laterality: N/A;  . EXTRACORPOREAL SHOCK WAVE LITHOTRIPSY Left 03/19/2018   Procedure: EXTRACORPOREAL SHOCK WAVE LITHOTRIPSY (ESWL);  Surgeon: Bjorn Loser, MD;  Location: [facility];  Service: Urology;  Laterality: Left;  . HAND TENDON SURGERY Left    left, complicated by MRSA  . LAMINECTOMY  21016   T10-T12  . spinal fluid leak patching     > 10X;[facility] - blood patching  . SPINAL FUSION  2017   T10-T11  . SPINAL FUSION  2018   T10-L1  . TEE WITHOUT CARDIOVERSION N/A 05/17/2020   Procedure: TRANSESOPHAGEAL ECHOCARDIOGRAM (TEE);  Surgeon: Sanda Klein, MD;  Location: [facility];  Service: Cardiovascular;  Laterality: N/A;  . WISDOM TOOTH EXTRACTION       Current Outpatient Medications  Medication Sig Dispense Refill  . butalbital-acetaminophen-caffeine (FIORICET) 50-325-40 MG tablet Take 1 tablet by mouth every 6 (six) hours as needed for migraine.    [name] Calcium (STOOL SOFTENER PO) Take 1 tablet by mouth daily.    . famotidine (PEPCID) 20 MG tablet Take 1 tablet (20 mg total) by mouth at bedtime. 90 tablet 3  . fluticasone (FLONASE) 50 MCG/ACT nasal spray Place 1 spray into both nostrils daily.    . furosemide (LASIX) 40 MG tablet Take 1 tablet (40 mg total) by mouth daily. 30 tablet 1  . gabapentin (NEURONTIN) 300 MG capsule Take 300 mg by mouth 3 (three) times daily.    Marland Kitchen losartan (COZAAR) 100 MG tablet Take 1 tablet (100 mg total) by mouth daily. 90 tablet 3  . methocarbamol (ROBAXIN) 750 MG tablet Take 750 mg by mouth every 6 (six) hours as needed for muscle spasms.    . metoprolol succinate (TOPROL-XL) 50 MG 24 hr tablet Take 1 tablet (50 mg total) by mouth  daily. Take with or immediately following a meal. (Patient taking differently: Take 50 mg by mouth at bedtime. Take with or immediately following a meal.) 90 tablet 3  . nortriptyline (PAMELOR) 50 MG capsule Take 50 mg by mouth 2 (two) times daily.    Marland Kitchen omeprazole (PRILOSEC) 40 MG capsule Take 1 capsule (40 mg total) by mouth 2 (two) times daily. 180 capsule 3  . ondansetron (ZOFRAN) 4 MG tablet Take 4 mg by mouth every 8 (eight) hours as needed for vomiting or nausea.    Marland Kitchen spironolactone (ALDACTONE) 25 MG tablet Take 25 mg by mouth at bedtime.    Marland Kitchen tiZANidine (ZANAFLEX) 2 MG tablet Take 2 mg by mouth every 6 (six) hours as needed for muscle spasms.    Marland Kitchen topiramate (TOPAMAX) 25 MG tablet Take 25 mg by mouth 3 (three) times daily.    . traMADol (ULTRAM) 50 MG tablet Take 50-100 mg by mouth 4 (four) times daily as needed for moderate pain.  2   No  current facility-administered medications for this visit.    Allergies:   Acetazolamide, Other, Levofloxacin, Levofloxacin in d5w, and Sulfamethoxazole-trimethoprim    Social History:  The patient  reports that she has quit smoking. Her smoking use included cigarettes. She has a 3.00 pack-year smoking history. She has never used smokeless tobacco. She reports that she does not drink alcohol and does not use drugs.   Family History:  The patient's family history includes Arthritis in her father; Diabetes in her father, maternal grandmother, and paternal grandfather; Heart disease in her brother, maternal aunt, maternal grandfather, maternal grandmother, maternal uncle, and mother; Hyperlipidemia in her father and paternal grandfather; Kidney disease in her father; Other in an other family member; Prostate cancer in her paternal grandfather; Stroke in her father; Stroke (age of onset: 44) in her maternal aunt.    ROS:  Please see the history of present illness.   Otherwise, review of systems are positive for none.   All other systems are reviewed and  negative.    PHYSICAL EXAM: VS:  BP (!) 148/96 (BP Location: Left Arm, Patient Position: Sitting, Cuff Size: Normal)   Pulse (!) 129   Ht '5\' 4"'  (1.626 m)   Wt 125 lb (56.7 kg)   BMI 21.46 kg/m  , BMI Body mass index is 21.46 kg/m.   General: Alert, oriented x3, no distress, slender Head: no evidence of trauma, PERRL, EOMI, no exophtalmos or lid lag, no myxedema, no xanthelasma; normal ears, nose and oropharynx Neck: normal jugular venous pulsations and no hepatojugular reflux; brisk carotid pulses without delay and no carotid bruits Chest: clear to auscultation, no signs of consolidation by percussion or palpation, normal fremitus, symmetrical and full respiratory excursions Cardiovascular: normal position and quality of the apical impulse, regular rhythm, normal first and second heart sounds, 2/6 decrescendo aortic murmur heard up and down the right sternal border, faint accompanying systolic ejection murmur, no apical murmurs, rubs or gallops Abdomen: no tenderness or distention, no masses by palpation, no abnormal pulsatility or arterial bruits, normal bowel sounds, no hepatosplenomegaly Extremities: no clubbing, cyanosis or edema; 2+ radial, ulnar and brachial pulses bilaterally; 2+ right femoral, posterior tibial and dorsalis pedis pulses; 2+ left femoral, posterior tibial and dorsalis pedis pulses; no subclavian or femoral bruits Neurological: grossly nonfocal Psych: Normal mood and affect    EKG:  EKG is ordered today.  It shows sinus tachycardia and possible left atrial abnormality, but is otherwise a normal tracing.  Recent Labs: 02/18/2020: B Natriuretic Peptide 18.4 05/12/2020: BUN 11; Creatinine, Ser 0.82; Hemoglobin 12.8; Platelets 311; Potassium 4.8; Sodium 141    Lipid Panel    Component Value Date/Time   CHOL 128 06/28/2016 1531   TRIG 98 06/28/2016 1531   HDL 48 06/28/2016 1531   LDLCALC 60 06/28/2016 1531      Wt Readings from Last 3 Encounters:  05/23/20 125  lb (56.7 kg)  05/17/20 128 lb (58.1 kg)  04/28/20 128 lb 12.8 oz (58.4 kg)      Other studies Reviewed: Additional studies/ records that were reviewed today include:   Echocardiogram 03/23/2020 1. Left ventricular ejection fraction, by estimation, is 65 to 70%. Left  ventricular ejection fraction by 3D volume is 68 %. The left ventricle has  normal function. The left ventricle has no regional wall motion  abnormalities. Left ventricular diastolic  parameters are consistent with Grade I diastolic dysfunction (impaired  relaxation). The average left ventricular global longitudinal strain is  -24.1 %. The global longitudinal strain  is normal.  2. Right ventricular systolic function is normal. The right ventricular  size is normal. There is normal pulmonary artery systolic pressure. The  estimated right ventricular systolic pressure is 79.1 mmHg.  3. The mitral valve is normal in structure. Trivial mitral valve  regurgitation. No evidence of mitral stenosis.  4. The aortic valve is calcified. There is moderate calcification of the  aortic valve. There is moderate thickening of the aortic valve. Aortic  valve regurgitation is moderate to severe. Aortic regurgitation PHT ranges  from 399to 270 msec. Mild to  moderate aortic valve sclerosis/calcification is present, without any  evidence of aortic stenosis.  5. The inferior vena cava is normal in size with greater than 50%  respiratory variability, suggesting right atrial pressure of 3 mmHg.  6. The patient has a hx of Ehler-Danlos syndrome with normal aortic root  and ascending aorta but the aortic arch and descending aorta appear  generous. Consider dedicated gated Chest CTA for further assessment.  7. Compared to prior echo, AI is now moderate to severe. Consider TEE for  further assessment.  Coronary CTA 01/2020: 1. No calcifications seen in the coronary arteries.  2. There is a metallic device in the right ventricle,  trapped by the trabeculation of the right ventricle. Clinical correlation advised. Ordering physician notified.  TEE 05/18/2018    TRANSESOPHOGEAL ECHO REPORT       Patient Name:  TIERRA THOMA Date of Exam: 05/17/2020  Medical Rec #: [id number]   Height:    64.0 in  Accession #:  [id number]   Weight:    128.8 lb  Date of Birth: 09-14-1980   BSA:     1.623 m  Patient Age:  83 years    BP:      145/82 mmHg  Patient Gender: F       HR:      90 bpm.  Exam Location: Inpatient   Procedure: Transesophageal Echo, 3D Echo, Color Doppler and Cardiac  Doppler   Indications:   Aortic Insuffciency    History:     Patient has prior history of Echocardiogram examinations,  most          recent 03/23/2020. Risk Factors:Hypertension.    Sonographer:   Dustin Flock  Referring Phys: [place]  Diagnosing Phys: Sanda Klein MD   PROCEDURE: After discussion of the risks and benefits of a TEE, an  informed consent was obtained. The transesophogeal probe was passed  without difficulty through the esophogus of the patient. Sedation  performed by different physician. The patient was  monitored while under deep sedation. Anesthestetic sedation was provided  intravenously by Anesthesiology: 134.50m of Propofol. The patient  developed no complications during the procedure.   IMPRESSIONS    1. Left ventricular ejection fraction, by estimation, is 60 to 65%. The  left ventricle has normal function. Left ventricular diastolic parameters  were normal.  2. Right ventricular systolic function is normal. The right ventricular  size is normal. There is normal pulmonary artery systolic pressure. The  estimated right ventricular systolic pressure is 270.7mmHg.  3. Left atrial size was mild to moderately dilated. No left atrial/left  atrial appendage thrombus was detected.  4. The mitral valve is myxomatous. Trivial  mitral valve regurgitation.  5. The tricuspid valve is myxomatous.  6. There are fixed sessile, verrucous appearing echodensities on the  margins of all three aortic valve leaflets, particularly prominent on the  right and left cusps. They impair leaflet  coaptation allowing for a  central regurgitant orifice.. The aortic  valve is tricuspid. There is moderate thickening of the aortic valve.  Aortic valve regurgitation is moderate to severe. No aortic stenosis is  present. Aortic regurgitation PHT measures 296 msec.  7. There is mild (Grade II) layered plaque involving the descending  aorta.   Conclusion(s)/Recommendation(s): The cause of the aortic valve changes is  unclear. Consider Libman-Sacks endocarditis or late changes of healed  bacterial endocarditis.   LEFT VENTRICLE  PLAX 2D  LVIDd:     4.19 cm  LVIDs:     2.89 cm     AORTIC VALVE  AI PHT:      296 msec  AR Vena Contracta: 0.43 cm    AORTA  Ao Root diam: 2.83 cm  Ao STJ diam: 2.7 cm  Ao Asc diam: 2.90 cm  Ao Arch diam: 1.9 cm  Ao Desc diam: 2.00 cm   TRICUSPID VALVE  TR Peak grad:  17.3 mmHg  TR Vmax:    208.00 cm/s     ASSESSMENT AND PLAN:  1. Acute diastolic heart failure ([place])   2. Nonrheumatic aortic (valve) insufficiency   3. Angina decubitus ([facility])   4. Embolism involving cardiac device, subsequent encounter   5. Essential hypertension   6. Jugular vein stenosis   7. Ehlers-Danlos syndrome   8. History of mitral valve prolapse   9. History of antinuclear antibodies      1. CHF: She continues to have symptoms of congestive heart failure without overt hypervolemia on physical exam and with normal left ventricular systolic function.  The reason appears to be severe aortic insufficiency.  Her blood pressure is surprisingly high and we will maximize her dose of losartan today.  Continue spironolactone and furosemide.  Avoid excessive doses of beta-blocker.  Consider switching to  carvedilol if her blood pressure continues to be high.  2. AI: Despite her history of Ehlers-Danlos syndrome, her aorta is normal in caliber and the aortic insufficiency is not due to aorta annular seizure.  Instead, it appears to be postinflammatory it may be sequela of endocarditis that occurred 15 years ago, although the echo in 2016 described only mild aortic insufficiency.  She does have some autoimmune abnormalities and she has a scheduled appointment with rheumatology.  However, with a sedimentation rate of only 1, it is very unlikely that she has active inflammatory valvulitis.  The aortic insufficiency appears to be clearly clinically important and she is symptomatic.  She understands that the risk of aortic valve surgery is increased due to her history of Ehlers-Danlos syndrome.  She has already felt this through and she wants any type of intervention that will improve her quality of life, since she already understands that her length of life might be limited by her connective tissue disorder.  For the same reason, she has already researched types of valves and made her mind up that she wants a bioprosthetic valve, even though in the typical patient her age we would recommend a mechanical valve.  Talked to her today about the fact the bioprosthetic valves do not have the same expectancy as mechanical valves, but there may be an option for valve in valve TAVR in the future.  Despite her normal caliber aorta, with her history of Ehlers-Danlos syndrome it might be wise to refer her to a center that performs high risk aortic valve surgery.  Will refer to Dr. Mali Hughes at [facility].  3. Chest pain/angina decubitus  in patient with significant family history of CAD: Symptoms are not typical for exertional angina.  Suspect that this is AI-related angina. No evidence of pulmonary embolism or aortic dissection on  recent perform CT and TEE.   Coronary [facility]A 01/2020 at [facility] (to further  evaluate device embolism to RV) was without coronary artery calcifications.   4. 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 No evidence of migration on CT chest 02/18/20.  Unlikely that any of her symptoms are due to this and suspect that by this point the device may be fully endothelialized.  5. HTN: Blood pressure has actually increased since her last appointment, we will maximize her dose of losartan.  Consider switching from Toprol to carvedilol.  Continue furosemide and spironolactone.  6. 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 Followed by neurosurgery outpatient - Continue aspirin and clopidogrel.  If surgery is considered for her aortic valve, will have to discontinue the clopidogrel.  Although I do not have a lot of experience with stenting in the veins, suspect that this could be safely done after 6 months (by June 2022)  7. Ehlers-Danlos Syndrome: reportedly mother has EDS with vascular component as well.   8. MVP: This was previously reported, but this is not evident on her TEE.  9.  Leg pain: she describes pain in her legs with walking, pallor on dependency, cold feet, and occasional tingling/numbness in her feet, but lower extremity arterial ultrasound 03/31/2020 was negative for obstruction.  Possible she has Raynaud's syndrome (related to her possible autoimmune disorder?).  10. Positive ANA/anti-Ro: She has an appointment with rheumatology at [facility] on Tuesday, May 10.  She has normal renal function, no history of clotting disorders, no rashes or inflammatory joint complaints, obvious myositis.  Clinical significance remains unclear.     Current medicines are reviewed at length with the patient today.  The patient does not have concerns regarding medicines.  The following changes have been made:  As above  Labs/ tests ordered today include:   Orders  Placed This Encounter  Procedures  . Ambulatory referral to Cardiothoracic Surgery  . EKG 12-Lead   Patient Instructions  Medication Instructions:  INCREASE the Losartan to 100 mg once daily  *If you need a refill on your cardiac medications before your next appointment, please call your pharmacy*   Lab Work: None ordered If you have labs (blood work) drawn today and your tests are completely normal, you will receive your results only by: Marland Kitchen MyChart Message (if you have MyChart) OR . A paper copy in the mail If you have any lab test that is abnormal or we need to change your treatment, we will call you to review the results.   Testing/Procedures: None ordered   Follow-Up: At [facility], you and your health needs are our priority.  As part of our continuing mission to provide you with exceptional heart care, we have created designated Provider Care Teams.  These Care Teams include your primary Cardiologist (physician) and Advanced Practice Providers (APPs -  Physician Assistants and Nurse Practitioners) who all work together to provide you with the care you need, when you need it.  We recommend signing up for the patient portal called "MyChart".  Sign up information is provided on this After Visit Summary.  MyChart is used to connect with patients for Virtual Visits (Telemedicine).  Patients are able to view lab/test  results, encounter notes, upcoming appointments, etc.  Non-urgent messages can be sent to your provider as well.   To learn more about what you can do with MyChart, go to [email or web address].    Your next appointment:   4 month(s)  The format for your next appointment:   In Person  Provider:   Sanda Klein, MD   Other Instructions A referral has been placed to [facility] with Dr. Mali Hughes     Signed, Sanda Klein, MD  05/26/2020 7:44 PM

## 2020-05-26 DIAGNOSIS — I5031 Acute diastolic (congestive) heart failure: Secondary | ICD-10-CM | POA: Insufficient documentation

## 2020-05-26 DIAGNOSIS — T82817A Embolism of cardiac prosthetic devices, implants and grafts, initial encounter: Secondary | ICD-10-CM | POA: Insufficient documentation

## 2020-05-26 DIAGNOSIS — I208 Other forms of angina pectoris: Secondary | ICD-10-CM | POA: Insufficient documentation

## 2020-05-26 DIAGNOSIS — Q796 Ehlers-Danlos syndrome, unspecified: Secondary | ICD-10-CM | POA: Insufficient documentation

## 2020-05-26 DIAGNOSIS — I10 Essential (primary) hypertension: Secondary | ICD-10-CM | POA: Insufficient documentation

## 2020-05-26 DIAGNOSIS — Z862 Personal history of diseases of the blood and blood-forming organs and certain disorders involving the immune mechanism: Secondary | ICD-10-CM | POA: Insufficient documentation

## 2020-05-26 DIAGNOSIS — I871 Compression of vein: Secondary | ICD-10-CM | POA: Insufficient documentation

## 2020-05-31 ENCOUNTER — Telehealth: Payer: Self-pay | Admitting: Cardiovascular Disease

## 2020-05-31 NOTE — Telephone Encounter (Signed)
Left message for Viickie at Dr. Mart Piggs [facility]) to call and discuss referral from Dr. Sallyanne Kuster.

## 2020-06-06 ENCOUNTER — Telehealth: Payer: Self-pay | Admitting: Cardiovascular Disease

## 2020-06-06 DIAGNOSIS — I351 Nonrheumatic aortic (valve) insufficiency: Secondary | ICD-10-CM

## 2020-06-06 DIAGNOSIS — I7410 Embolism and thrombosis of unspecified parts of aorta: Secondary | ICD-10-CM | POA: Insufficient documentation

## 2020-06-06 NOTE — Telephone Encounter (Signed)
Spoke with patient regarding the referral to Dr. Mart Piggs at [facility]---informed patient records had been faxed to his office----records will be reviewed by the provider and the office will call to schedule the appointment..  Requested patient call us next week if she has not heard from them and I will call to check on the status of the appointment.  Patient voiced her understanding.

## 2020-06-10 DIAGNOSIS — M159 Polyosteoarthritis, unspecified: Secondary | ICD-10-CM | POA: Diagnosis not present

## 2020-06-10 DIAGNOSIS — J449 Chronic obstructive pulmonary disease, unspecified: Secondary | ICD-10-CM | POA: Diagnosis not present

## 2020-06-20 DIAGNOSIS — Z7951 Long term (current) use of inhaled steroids: Secondary | ICD-10-CM | POA: Diagnosis not present

## 2020-06-20 DIAGNOSIS — R519 Headache, unspecified: Secondary | ICD-10-CM | POA: Diagnosis not present

## 2020-06-20 DIAGNOSIS — Z79899 Other long term (current) drug therapy: Secondary | ICD-10-CM | POA: Diagnosis not present

## 2020-06-20 DIAGNOSIS — Z8249 Family history of ischemic heart disease and other diseases of the circulatory system: Secondary | ICD-10-CM | POA: Diagnosis not present

## 2020-06-20 DIAGNOSIS — R760 Raised antibody titer: Secondary | ICD-10-CM | POA: Diagnosis not present

## 2020-06-20 DIAGNOSIS — Z8679 Personal history of other diseases of the circulatory system: Secondary | ICD-10-CM | POA: Diagnosis not present

## 2020-06-20 DIAGNOSIS — I358 Other nonrheumatic aortic valve disorders: Secondary | ICD-10-CM | POA: Diagnosis not present

## 2020-06-20 DIAGNOSIS — Q796 Ehlers-Danlos syndrome, unspecified: Secondary | ICD-10-CM | POA: Diagnosis not present

## 2020-06-20 DIAGNOSIS — Z7902 Long term (current) use of antithrombotics/antiplatelets: Secondary | ICD-10-CM | POA: Diagnosis not present

## 2020-06-20 DIAGNOSIS — Z87891 Personal history of nicotine dependence: Secondary | ICD-10-CM | POA: Diagnosis not present

## 2020-06-20 DIAGNOSIS — Z823 Family history of stroke: Secondary | ICD-10-CM | POA: Diagnosis not present

## 2020-06-20 DIAGNOSIS — G43909 Migraine, unspecified, not intractable, without status migrainosus: Secondary | ICD-10-CM | POA: Diagnosis not present

## 2020-06-20 DIAGNOSIS — R768 Other specified abnormal immunological findings in serum: Secondary | ICD-10-CM | POA: Diagnosis not present

## 2020-06-20 DIAGNOSIS — I7300 Raynaud's syndrome without gangrene: Secondary | ICD-10-CM | POA: Diagnosis not present

## 2020-06-20 DIAGNOSIS — E507 Other ocular manifestations of vitamin A deficiency: Secondary | ICD-10-CM | POA: Diagnosis not present

## 2020-06-20 DIAGNOSIS — Z7982 Long term (current) use of aspirin: Secondary | ICD-10-CM | POA: Diagnosis not present

## 2020-06-20 DIAGNOSIS — K117 Disturbances of salivary secretion: Secondary | ICD-10-CM | POA: Diagnosis not present

## 2020-06-23 DIAGNOSIS — G9600 Cerebrospinal fluid leak, unspecified: Secondary | ICD-10-CM | POA: Diagnosis not present

## 2020-07-03 DIAGNOSIS — I351 Nonrheumatic aortic (valve) insufficiency: Secondary | ICD-10-CM | POA: Diagnosis not present

## 2020-07-03 DIAGNOSIS — I7410 Embolism and thrombosis of unspecified parts of aorta: Secondary | ICD-10-CM | POA: Diagnosis not present

## 2020-07-03 DIAGNOSIS — I081 Rheumatic disorders of both mitral and tricuspid valves: Secondary | ICD-10-CM | POA: Diagnosis not present

## 2020-07-07 DIAGNOSIS — N200 Calculus of kidney: Secondary | ICD-10-CM | POA: Diagnosis not present

## 2020-07-07 DIAGNOSIS — R1031 Right lower quadrant pain: Secondary | ICD-10-CM | POA: Diagnosis not present

## 2020-07-10 DIAGNOSIS — Z01818 Encounter for other preprocedural examination: Secondary | ICD-10-CM | POA: Diagnosis not present

## 2020-07-10 DIAGNOSIS — I7410 Embolism and thrombosis of unspecified parts of aorta: Secondary | ICD-10-CM | POA: Diagnosis not present

## 2020-07-10 DIAGNOSIS — I351 Nonrheumatic aortic (valve) insufficiency: Secondary | ICD-10-CM | POA: Diagnosis not present

## 2020-07-10 DIAGNOSIS — I700 Atherosclerosis of aorta: Secondary | ICD-10-CM | POA: Diagnosis not present

## 2020-07-11 DIAGNOSIS — J449 Chronic obstructive pulmonary disease, unspecified: Secondary | ICD-10-CM | POA: Diagnosis not present

## 2020-07-11 DIAGNOSIS — M159 Polyosteoarthritis, unspecified: Secondary | ICD-10-CM | POA: Diagnosis not present

## 2020-07-13 ENCOUNTER — Telehealth: Payer: Self-pay | Admitting: Cardiovascular Disease

## 2020-07-13 NOTE — Telephone Encounter (Signed)
[facility] received the requested disk of Echo's. Called patient authorized person to inform that the disk was ready to be picked up by the authorized recipient as indicated on the ROI authorization. AO 07/13/20

## 2020-07-14 NOTE — Telephone Encounter (Signed)
Designated person on authorization came to pick up disk as requested. Verified by Driver's License. AO 07/14/20

## 2020-07-20 DIAGNOSIS — Q796 Ehlers-Danlos syndrome, unspecified: Secondary | ICD-10-CM | POA: Diagnosis not present

## 2020-07-20 DIAGNOSIS — I351 Nonrheumatic aortic (valve) insufficiency: Secondary | ICD-10-CM | POA: Diagnosis not present

## 2020-07-21 DIAGNOSIS — R310 Gross hematuria: Secondary | ICD-10-CM | POA: Diagnosis not present

## 2020-07-21 DIAGNOSIS — N200 Calculus of kidney: Secondary | ICD-10-CM | POA: Diagnosis not present

## 2020-08-02 DIAGNOSIS — R310 Gross hematuria: Secondary | ICD-10-CM | POA: Diagnosis not present

## 2020-08-02 DIAGNOSIS — I700 Atherosclerosis of aorta: Secondary | ICD-10-CM | POA: Diagnosis not present

## 2020-08-02 DIAGNOSIS — N200 Calculus of kidney: Secondary | ICD-10-CM | POA: Diagnosis not present

## 2020-08-10 DIAGNOSIS — M159 Polyosteoarthritis, unspecified: Secondary | ICD-10-CM | POA: Diagnosis not present

## 2020-08-10 DIAGNOSIS — G932 Benign intracranial hypertension: Secondary | ICD-10-CM | POA: Diagnosis not present

## 2020-08-10 DIAGNOSIS — G96810 Intracranial hypotension, unspecified: Secondary | ICD-10-CM | POA: Diagnosis not present

## 2020-08-10 DIAGNOSIS — J449 Chronic obstructive pulmonary disease, unspecified: Secondary | ICD-10-CM | POA: Diagnosis not present

## 2020-08-18 DIAGNOSIS — Z6822 Body mass index (BMI) 22.0-22.9, adult: Secondary | ICD-10-CM | POA: Diagnosis not present

## 2020-08-18 DIAGNOSIS — L0102 Bockhart's impetigo: Secondary | ICD-10-CM | POA: Diagnosis not present

## 2020-08-18 DIAGNOSIS — I351 Nonrheumatic aortic (valve) insufficiency: Secondary | ICD-10-CM | POA: Diagnosis not present

## 2020-08-18 DIAGNOSIS — M3211 Endocarditis in systemic lupus erythematosus: Secondary | ICD-10-CM | POA: Diagnosis not present

## 2020-08-18 DIAGNOSIS — I10 Essential (primary) hypertension: Secondary | ICD-10-CM | POA: Diagnosis not present

## 2020-08-18 DIAGNOSIS — E063 Autoimmune thyroiditis: Secondary | ICD-10-CM | POA: Diagnosis not present

## 2020-08-28 DIAGNOSIS — R5383 Other fatigue: Secondary | ICD-10-CM | POA: Diagnosis not present

## 2020-08-28 DIAGNOSIS — Z6821 Body mass index (BMI) 21.0-21.9, adult: Secondary | ICD-10-CM | POA: Diagnosis not present

## 2020-08-28 DIAGNOSIS — E559 Vitamin D deficiency, unspecified: Secondary | ICD-10-CM | POA: Diagnosis not present

## 2020-08-28 DIAGNOSIS — E538 Deficiency of other specified B group vitamins: Secondary | ICD-10-CM | POA: Diagnosis not present

## 2020-08-28 DIAGNOSIS — R000 Tachycardia, unspecified: Secondary | ICD-10-CM | POA: Diagnosis not present

## 2020-08-28 DIAGNOSIS — L0102 Bockhart's impetigo: Secondary | ICD-10-CM | POA: Diagnosis not present

## 2020-09-08 DIAGNOSIS — R5383 Other fatigue: Secondary | ICD-10-CM | POA: Diagnosis not present

## 2020-09-08 DIAGNOSIS — E559 Vitamin D deficiency, unspecified: Secondary | ICD-10-CM | POA: Diagnosis not present

## 2020-09-08 DIAGNOSIS — E538 Deficiency of other specified B group vitamins: Secondary | ICD-10-CM | POA: Diagnosis not present

## 2020-09-10 DIAGNOSIS — J449 Chronic obstructive pulmonary disease, unspecified: Secondary | ICD-10-CM | POA: Diagnosis not present

## 2020-09-10 DIAGNOSIS — M159 Polyosteoarthritis, unspecified: Secondary | ICD-10-CM | POA: Diagnosis not present

## 2020-09-11 DIAGNOSIS — R0602 Shortness of breath: Secondary | ICD-10-CM | POA: Diagnosis not present

## 2020-09-11 DIAGNOSIS — I351 Nonrheumatic aortic (valve) insufficiency: Secondary | ICD-10-CM | POA: Diagnosis not present

## 2020-09-11 DIAGNOSIS — D680 Von Willebrand's disease: Secondary | ICD-10-CM | POA: Diagnosis not present

## 2020-09-11 DIAGNOSIS — R0609 Other forms of dyspnea: Secondary | ICD-10-CM | POA: Diagnosis not present

## 2020-09-11 DIAGNOSIS — R002 Palpitations: Secondary | ICD-10-CM | POA: Diagnosis not present

## 2020-09-11 DIAGNOSIS — R000 Tachycardia, unspecified: Secondary | ICD-10-CM | POA: Diagnosis not present

## 2020-09-15 DIAGNOSIS — R310 Gross hematuria: Secondary | ICD-10-CM | POA: Diagnosis not present

## 2020-09-15 DIAGNOSIS — N3021 Other chronic cystitis with hematuria: Secondary | ICD-10-CM | POA: Diagnosis not present

## 2020-09-15 DIAGNOSIS — R1031 Right lower quadrant pain: Secondary | ICD-10-CM | POA: Diagnosis not present

## 2020-09-18 ENCOUNTER — Telehealth: Payer: Self-pay | Admitting: Internal Medicine

## 2020-09-18 MED ORDER — FAMOTIDINE 20 MG PO TABS: 20.0000 mg | ORAL_TABLET | Freq: Every day | ORAL | 3 refills | Status: DC

## 2020-09-18 MED ORDER — OMEPRAZOLE 40 MG PO CPDR: 40.0000 mg | DELAYED_RELEASE_CAPSULE | Freq: Two times a day (BID) | ORAL | 3 refills | Status: DC

## 2020-09-18 NOTE — Telephone Encounter (Signed)
Refilled Omeprazole and Pepcid

## 2020-09-23 DIAGNOSIS — Z8489 Family history of other specified conditions: Secondary | ICD-10-CM | POA: Diagnosis not present

## 2020-09-24 DIAGNOSIS — R000 Tachycardia, unspecified: Secondary | ICD-10-CM | POA: Diagnosis not present

## 2020-09-25 ENCOUNTER — Encounter: Payer: Self-pay | Admitting: Infectious Disease

## 2020-09-25 ENCOUNTER — Ambulatory Visit: Payer: Medicare Other | Admitting: Infectious Disease

## 2020-09-25 ENCOUNTER — Other Ambulatory Visit: Payer: Self-pay

## 2020-09-25 VITALS — BP 147/88 | HR 113 | Temp 98.4°F | Resp 16 | Ht 64.0 in | Wt 136.0 lb

## 2020-09-25 DIAGNOSIS — M00032 Staphylococcal arthritis, left wrist: Secondary | ICD-10-CM

## 2020-09-25 DIAGNOSIS — B9562 Methicillin resistant Staphylococcus aureus infection as the cause of diseases classified elsewhere: Secondary | ICD-10-CM

## 2020-09-25 DIAGNOSIS — T82817D Embolism of cardiac prosthetic devices, implants and grafts, subsequent encounter: Secondary | ICD-10-CM | POA: Diagnosis not present

## 2020-09-25 DIAGNOSIS — M009 Pyogenic arthritis, unspecified: Secondary | ICD-10-CM

## 2020-09-25 DIAGNOSIS — M4322 Fusion of spine, cervical region: Secondary | ICD-10-CM

## 2020-09-25 DIAGNOSIS — Q796 Ehlers-Danlos syndrome, unspecified: Secondary | ICD-10-CM | POA: Diagnosis not present

## 2020-09-25 DIAGNOSIS — Z862 Personal history of diseases of the blood and blood-forming organs and certain disorders involving the immune mechanism: Secondary | ICD-10-CM

## 2020-09-25 DIAGNOSIS — D68318 Other hemorrhagic disorder due to intrinsic circulating anticoagulants, antibodies, or inhibitors: Secondary | ICD-10-CM | POA: Diagnosis not present

## 2020-09-25 DIAGNOSIS — I351 Nonrheumatic aortic (valve) insufficiency: Secondary | ICD-10-CM

## 2020-09-25 DIAGNOSIS — G9600 Cerebrospinal fluid leak, unspecified: Secondary | ICD-10-CM | POA: Diagnosis not present

## 2020-09-25 DIAGNOSIS — I871 Compression of vein: Secondary | ICD-10-CM

## 2020-09-25 DIAGNOSIS — I358 Other nonrheumatic aortic valve disorders: Secondary | ICD-10-CM

## 2020-09-25 DIAGNOSIS — R7881 Bacteremia: Secondary | ICD-10-CM | POA: Diagnosis not present

## 2020-09-25 DIAGNOSIS — R21 Rash and other nonspecific skin eruption: Secondary | ICD-10-CM

## 2020-09-25 DIAGNOSIS — I7410 Embolism and thrombosis of unspecified parts of aorta: Secondary | ICD-10-CM | POA: Diagnosis not present

## 2020-09-25 DIAGNOSIS — I10 Essential (primary) hypertension: Secondary | ICD-10-CM

## 2020-09-25 DIAGNOSIS — M3211 Endocarditis in systemic lupus erythematosus: Secondary | ICD-10-CM

## 2020-09-25 HISTORY — DX: Pyogenic arthritis, unspecified: M00.9

## 2020-09-25 HISTORY — DX: Rash and other nonspecific skin eruption: R21

## 2020-09-25 HISTORY — DX: Endocarditis in systemic lupus erythematosus: M32.11

## 2020-09-25 HISTORY — DX: Other nonrheumatic aortic valve disorders: I35.8

## 2020-09-25 MED ORDER — TRIAMCINOLONE ACETONIDE 0.5 % EX OINT: 1.0000 "application " | TOPICAL_OINTMENT | Freq: Two times a day (BID) | CUTANEOUS | 2 refills | Status: DC

## 2020-09-25 NOTE — Progress Notes (Signed)
Subjective:  Reasons for infectious disease consult: concern over rash on back thought to be due to staphylococcal infection, aortic valve endocarditis (which seems more likely to be due to a prior infection vs a Martie Lee endocardits   Requesting Physician: Redmond School, MD   Patient ID: Lorraine Weber, female    DOB: [birth year], 40 y.o.   MRN: [id number]  HPI  Lorraine Weber is a very nice 40 year old who has had a complicated medical history including history of von Willebrand's disease, prior brain injury, chronic headaches with intracranial hypertension requiring lumbar puncture, Ehlers-Danlos syndrome hypertension  and family history of aortic dissection had developed worsening exertional dyspnea and was found in April to have very aortic insufficiency.  She underwent transesophageal echocardiogram on May 17, 2020 with Dr. Sallyanne Kuster was found to have vegetations on all 3 aortic leaflets.  He felt that the appearance was suggestive of Libman-Sacks endocarditis.  Patient had an ANA checked which was positive and also an increase in SSA antibodies though segmentation rate was normal normal anticardiolipin antibodies were negative. He also noted that it was possible that these lesions were from prior bacteremia--and in fact the patient DID have MRSA bacteremia while in [place] in 2006 with septic wrist after elective surgery that then required I&D and protracted IV antibiotics.  Is also seen by my partner Dr. Johnnye Sima in 2012 when she had an infection of a Botox injection site on her face.   Been referred to [facility] CT surgery and seen there by Dr Ysidro Evert along with Cardiology with Dr. Sheila Oats.   She has hardware in spine placed by Dr. Owens Shark with Orthopedics also at [facility] that has recently become more and more palpable in the context of weight loss.  Is developed a rash on her back that started apparently with a lumbar puncture site that became inflamed in her lower back.  Since  then she has had other lesions develop on her upper back largely between her scapula they are very pruritic and I suspect she may be scratching them due to the intense itching that they cause at times.  She has been given several rounds of antibiotics with the presumption that this might be a staphylococcal infection which she has had in the past.  She says that when she took doxycycline this made no impact on her rash nor did clindamycin.  There is certainly concerned about her back and whether this is an infection is not responding to antibiotics.  There also is of course the concern about whether she could have had an actual bacteremia that caused her endocarditis.         Past Medical History:  Diagnosis Date   Anemia    Aortic valve endocarditis 09/25/2020   Clotting disorder ([place])    Von Willebrand    Concussion    x8, last 2010, residual homonymous Hemianopsia   CSF leak    multiple serum pathes   Frequent sinus infections    tx with flonase nasal spray   GERD (gastroesophageal reflux disease)    History of blood transfusion    over 5 yrs ago   Hypertension    Macular rash 09/25/2020   Mastoiditis    Migraines    Nephrolithiasis    X5, Dr Jeffie Pollock, passed stones, no surgery   Peptic ulcer disease    Septic joint of left wrist ([place]) 09/25/2020   Von Willebrand disease ([place])    mild form per patient    Past  Surgical History:  Procedure Laterality Date   APPENDECTOMY     cerebral stent Bilateral    jugular vein   CERVICAL FUSION  2018   C5-C7   DILITATION & CURRETTAGE/HYSTROSCOPY WITH NOVASURE ABLATION N/A 08/23/2015   Procedure: DILATATION & CURETTAGE/HYSTEROSCOPY WITH NOVASURE ABLATION;  Surgeon: Vanessa Kick, MD;  Location: [facility];  Service: Gynecology;  Laterality: N/A;   EXTRACORPOREAL SHOCK WAVE LITHOTRIPSY Left 03/19/2018   Procedure: EXTRACORPOREAL SHOCK WAVE LITHOTRIPSY (ESWL);  Surgeon: Bjorn Loser, MD;  Location: [facility];  Service: Urology;  Laterality:  Left;   HAND TENDON SURGERY Left    left, complicated by MRSA   LAMINECTOMY  21016   T10-T12   spinal fluid leak patching     > 10X;[facility] - blood patching   SPINAL FUSION  2017   T10-T11   SPINAL FUSION  2018   T10-L1   TEE WITHOUT CARDIOVERSION N/A 05/17/2020   Procedure: TRANSESOPHAGEAL ECHOCARDIOGRAM (TEE);  Surgeon: Sanda Klein, MD;  Location: [facility] ENDOSCOPY;  Service: Cardiovascular;  Laterality: N/A;   WISDOM TOOTH EXTRACTION      Family History  Problem Relation Age of Onset   Arthritis Father    Hyperlipidemia Father    Diabetes Father    Stroke Father        PTE post CVA   Kidney disease Father    Prostate cancer Paternal Grandfather    Hyperlipidemia Paternal Grandfather    Diabetes Paternal Grandfather    Heart disease Mother        ?hypertrophic idiopathic subaortic stenosis   Heart disease Maternal Grandmother    Diabetes Maternal Grandmother    Heart disease Maternal Grandfather        HISS   Heart disease Maternal Aunt        HISS   Stroke Maternal Aunt 50   Heart disease Maternal Uncle    Heart disease Brother        HISS   Other Other        EDS in M, bro & niece   Stomach cancer Neg Hx    Colon cancer Neg Hx    Rectal cancer Neg Hx    Ovarian cancer Neg Hx       Social History   Socioeconomic History   Marital status: Married    Spouse name: Not on file   Number of children: 0   Years of education: Not on file   Highest education level: Not on file  Occupational History   Occupation: [employer]    Employer: [employer]   Occupation: disabled  Tobacco Use   Smoking status: Former    Packs/day: 0.30    Years: 10.00    Pack years: 3.00    Types: Cigarettes   Smokeless tobacco: Never   Tobacco comments:    smoked age 13-present, up to 1 ppd.05/24/14 1/2 ppd  Vaping Use   Vaping Use: Never used  Substance and Sexual Activity   Alcohol use: No    Alcohol/week: 0.0 standard drinks   Drug use: No   Sexual activity: Yes     Partners: Male    Birth control/protection: None    Comment: IUD removed 06/2015  Other Topics Concern   Not on file  Social History Narrative   Not on file   Social Determinants of Health   Financial Resource Strain: Not on file  Food Insecurity: Not on file  Transportation Needs: Not on file  Physical Activity: Not on file  Stress: Not on file  Social Connections: Not on file    Allergies  Allergen Reactions   Acetazolamide Shortness Of Breath   Other Other (See Comments)    Pt Instructed by hematologist not to take blood thinners  ALL BLOOD THINNERS-CANNOT TAKE DUE TO VON WILDEBRAND'S    Levofloxacin Itching and Rash   Levofloxacin In D5w Swelling and Rash   Sulfamethoxazole-Trimethoprim Rash    "Body Aches"     Current Outpatient Medications:    cyanocobalamin (,VITAMIN B-12,) 1000 MCG/ML injection, Inject 1,000 mcg into the muscle every 30 (thirty) days., Disp: , Rfl:    famotidine (PEPCID) 20 MG tablet, Take 1 tablet (20 mg total) by mouth at bedtime., Disp: 90 tablet, Rfl: 3   fluticasone (FLONASE) 50 MCG/ACT nasal spray, Place 1 spray into both nostrils daily., Disp: , Rfl:    furosemide (LASIX) 40 MG tablet, Take 1 tablet (40 mg total) by mouth daily., Disp: 30 tablet, Rfl: 1   gabapentin (NEURONTIN) 300 MG capsule, Take 300 mg by mouth 3 (three) times daily., Disp: , Rfl:    losartan (COZAAR) 100 MG tablet, Take 1 tablet (100 mg total) by mouth daily., Disp: 90 tablet, Rfl: 3   methocarbamol (ROBAXIN) 750 MG tablet, Take 750 mg by mouth every 6 (six) hours as needed for muscle spasms., Disp: , Rfl:    metoprolol succinate (TOPROL-XL) 50 MG 24 hr tablet, Take 1 tablet (50 mg total) by mouth daily. Take with or immediately following a meal. (Patient taking differently: Take 50 mg by mouth at bedtime. Take with or immediately following a meal.), Disp: 90 tablet, Rfl: 3   nortriptyline (PAMELOR) 50 MG capsule, Take 50 mg by mouth 2 (two) times daily., Disp: , Rfl:     omeprazole (PRILOSEC) 40 MG capsule, Take 1 capsule (40 mg total) by mouth 2 (two) times daily., Disp: 180 capsule, Rfl: 3   ondansetron (ZOFRAN) 4 MG tablet, Take 4 mg by mouth every 8 (eight) hours as needed for vomiting or nausea., Disp: , Rfl:    spironolactone (ALDACTONE) 25 MG tablet, Take 25 mg by mouth at bedtime., Disp: , Rfl:    tiZANidine (ZANAFLEX) 2 MG tablet, Take 2 mg by mouth every 6 (six) hours as needed for muscle spasms., Disp: , Rfl:    topiramate (TOPAMAX) 25 MG tablet, Take 25 mg by mouth 3 (three) times daily., Disp: , Rfl:    traMADol (ULTRAM) 50 MG tablet, Take 50-100 mg by mouth 4 (four) times daily as needed for moderate pain., Disp: , Rfl: 2   triamcinolone ointment (KENALOG) 0.5 %, Apply 1 application topically 2 (two) times daily., Disp: 30 g, Rfl: 2   Vitamin D, Ergocalciferol, (DRISDOL) 1.25 MG (50000 UNIT) CAPS capsule, Take 50,000 Units by mouth once a week., Disp: , Rfl:     Review of Systems  Constitutional:  Negative for chills and fever.  HENT:  Negative for congestion, hearing loss and sore throat.   Eyes:  Negative for photophobia.  Respiratory:  Negative for cough, shortness of breath and wheezing.   Cardiovascular:  Positive for palpitations. Negative for chest pain and leg swelling.  Gastrointestinal:  Negative for abdominal pain, blood in stool, constipation, diarrhea, nausea and vomiting.  Genitourinary:  Negative for dysuria, flank pain and hematuria.  Musculoskeletal:  Negative for back pain and myalgias.  Skin:  Positive for rash and wound.  Neurological:  Negative for dizziness, seizures, weakness and headaches.  Hematological:  Does not bruise/bleed easily.  Psychiatric/Behavioral:  Negative for agitation, behavioral problems, decreased concentration, dysphoric mood and suicidal ideas. The patient is nervous/anxious.       Objective:   Physical Exam Constitutional:      General: She is not in acute distress.    Appearance: Normal  appearance. She is well-developed. She is not ill-appearing or diaphoretic.  HENT:     Head: Normocephalic and atraumatic.     Right Ear: Hearing and external ear normal.     Left Ear: Hearing and external ear normal.     Nose: No nasal deformity or rhinorrhea.  Eyes:     General: No scleral icterus.    Extraocular Movements: Extraocular movements intact.     Conjunctiva/sclera: Conjunctivae normal.     Right eye: Right conjunctiva is not injected.     Left eye: Left conjunctiva is not injected.     Pupils: Pupils are equal, round, and reactive to light.  Neck:     Vascular: No JVD.  Cardiovascular:     Rate and Rhythm: Regular rhythm. Tachycardia present.     Heart sounds: S1 normal and S2 normal. Murmur heard.    No friction rub.  Pulmonary:     Effort: Pulmonary effort is normal. No respiratory distress.     Breath sounds: No stridor. No wheezing or rhonchi.  Abdominal:     General: Bowel sounds are normal. There is no distension.     Palpations: Abdomen is soft.     Tenderness: There is no abdominal tenderness.  Musculoskeletal:        General: Normal range of motion.     Right shoulder: Normal.     Left shoulder: Normal.     Cervical back: Normal range of motion and neck supple.     Right hip: Normal.     Left hip: Normal.     Right knee: Normal.     Left knee: Normal.  Lymphadenopathy:     Head:     Right side of head: No submandibular, preauricular or posterior auricular adenopathy.     Left side of head: No submandibular, preauricular or posterior auricular adenopathy.     Cervical: No cervical adenopathy.     Right cervical: No superficial or deep cervical adenopathy.    Left cervical: No superficial or deep cervical adenopathy.  Skin:    General: Skin is warm and dry.     Coloration: Skin is not pale.     Findings: No abrasion, bruising, ecchymosis, erythema, lesion or rash.     Nails: There is no clubbing.  Neurological:     General: No focal deficit  present.     Mental Status: She is alert and oriented to person, place, and time.     Sensory: No sensory deficit.     Coordination: Coordination normal.     Gait: Gait normal.  Psychiatric:        Attention and Perception: She is attentive.        Mood and Affect: Mood normal.        Speech: Speech normal.        Behavior: Behavior normal. Behavior is cooperative.        Thought Content: Thought content normal.        Judgment: Judgment normal.    Rash on her back 09/25/2020:             Assessment & Plan:   Rash on back: Fact that this has not responded to anti-MRSA therapy with doxycycline  or clindamycin which also sometimes has activity against MRSA, MSSA and streptococcal species suggest that there is not likely skin infection.  He would also seem odd if this was allergic reaction given that it is really confined to her back.  When I initially heard her story I had some concern about whether her hardware was actually protruding from her wound but it clearly is not.  I suspect this may be some type of contact dermatitis that is potentially made worse by the itching and potentially scratching.  With her complex medical history and in view of likely autoimmune pathology she would be benefit fitted by seeing a dermatologist I have made a referral to dermatologist here in town that her husband is seen before.  In the meantime we will do a trial of topical triamcinolone and 0.5% ointment that she can apply on some of the lesions to see if they respond to this.  Other interventions could include hydroxyzine or other anti histamines to help with the pruritis   Severe aortic insufficiency with what sounds like a Libman-Sacks type endocarditis seen on transesophageal echocardiogram:  Cardiothoracic surgery is tentatively planned for early 2023.  She is going to be seen by rheumatology at [facility].  I will obtain a set of blood cultures today in clinic  though there likely to be of very low yield given the fact that she does not appear systemically ill I would be shocked if she is actively bacteremic.  Certainly is possible that she could have seeded this valve previously when she was bacteremic in [place] or in the interval.  I will also reach out to Dr. Orene Desanctis, I know typically that if he was concerned about active factious endocarditis he would have reached out to [place] in the inpatient world and would have formally seen her as an inpatient and given her course of empiric antibiotics.  Hx of CSF leak: not an issue currently  I spent 845 minutes with the patient including greater than 50% of time in face to face counseling of the patient and her husband regarding her prior bacteremia, her findings on transesophageal echocardiogram the different types of endocarditis including infectious and noninfectious or marantic and Libman-Sacks endocarditis, reviewing her clinical course, reviewing notes from her cardiothoracic surgeon cardiologist at [facility] as well as her orthopedic surgeon there and Dr.  Orene Desanctis here, reviewing her autoimmune antibodies, along with  review of medical records in preparation for the visit and during the visit and in coordination of her care.

## 2020-09-29 ENCOUNTER — Encounter: Payer: Self-pay | Admitting: Cardiovascular Disease

## 2020-09-29 ENCOUNTER — Other Ambulatory Visit: Payer: Self-pay

## 2020-09-29 ENCOUNTER — Ambulatory Visit: Payer: Medicare Other | Admitting: Cardiovascular Disease

## 2020-09-29 VITALS — BP 110/70 | HR 97 | Resp 20 | Ht 64.0 in | Wt 130.4 lb

## 2020-09-29 DIAGNOSIS — Q796 Ehlers-Danlos syndrome, unspecified: Secondary | ICD-10-CM | POA: Diagnosis not present

## 2020-09-29 DIAGNOSIS — I351 Nonrheumatic aortic (valve) insufficiency: Secondary | ICD-10-CM | POA: Diagnosis not present

## 2020-09-29 DIAGNOSIS — I871 Compression of vein: Secondary | ICD-10-CM | POA: Diagnosis not present

## 2020-09-29 DIAGNOSIS — T82817D Embolism of cardiac prosthetic devices, implants and grafts, subsequent encounter: Secondary | ICD-10-CM

## 2020-09-29 DIAGNOSIS — I10 Essential (primary) hypertension: Secondary | ICD-10-CM

## 2020-09-29 DIAGNOSIS — I5032 Chronic diastolic (congestive) heart failure: Secondary | ICD-10-CM | POA: Diagnosis not present

## 2020-09-29 DIAGNOSIS — R768 Other specified abnormal immunological findings in serum: Secondary | ICD-10-CM

## 2020-09-29 DIAGNOSIS — R079 Chest pain, unspecified: Secondary | ICD-10-CM

## 2020-09-29 DIAGNOSIS — I7300 Raynaud's syndrome without gangrene: Secondary | ICD-10-CM

## 2020-09-29 NOTE — Progress Notes (Signed)
Cardiology Office Note   Date:  09/30/2020   ID:  Lorraine, Weber 30-Nov-1980, MRN [id number]  PCP:  Redmond School, MD  Cardiologist:  Sanda Klein, MD EP: None  Chief Complaint  Patient presents with   Cardiac Valve Problem       History of Present Illness: Lorraine Weber is a 40 y.o. female with a PMH of HTN, presumed Ehlers-Danlos syndrome with vascular manifestations, Von Willebrands disease, idiopathic peripheral neuropathy, who presents for follow-up of aortic insufficiency.  Several months of worsening exertional dyspnea through a repeat echocardiogram that showed moderate to severe aortic insufficiency, with preserved left ventricular systolic function..  The suspicion was initially that this could be related to aneurysm formation due to her reported diagnosis of Ehlers-Danlos syndrome.  Transesophageal echocardiography in April showed no abnormalities of the aortic root, which showed unusual changes of the aortic valve cusps, suggestive of healed or noninfective endocarditis.  The appearance was vaguely suggestive of Libman-Sacks endocarditis so we sent off screening tests for autoimmune disease.  ANA was positive with a striking increase in SSA (Ro) antibodies, but the ESR was only 1 suggesting that there is no active ongoing inflammation.  Anticardiolipin antibodies were negative.  Her platelet count is normal and she is not anemic.  Since then she has been evaluated by Dr. Mali Hughes at [facility] who recommended against surgery at this time.  She has been referred to a rheumatologist and has already submitted a sample for genetic testing for Ehlers-Danlos syndrome.  She is also established contact with cardiology at [facility] with Dr. Sheila Oats.  She had another transthoracic echocardiogram at [facility] on 07/03/2020 which described her aortic insufficiency as moderate.  And another CT angiogram of the aorta on 07/10/2020.  This did not change the diagnostic findings.  Finally she had a  3-day event monitor that showed rare PVCs and PACs with 2 episodes of nonsustained atrial tachycardia with a maximum of 16 beats.  Emergency work-up in January 2022 showed normal cardiac troponins, normal chest x-ray, no evidence of pulmonary embolism on CT angiography.  There was also no evidence of aortic aneurysm or dissection. She was noted to have age advanced atheromatous plaque of the aorta and proximal left subclavian on CT scan 02/18/2020.  The same surprising finding was noted on her TEE.  She does have a history of severe illness with Staph aureus bacteremia in 2006.  She had a PICC line and received antibiotics intravenously for several weeks.  She underwent left internal jugular vein stenting 10/2019 for management of stenosis causing elevated intracerebral pressure (bilateral stenosis in turn occurring close to the level of previous cervical spine surgery). Shortly thereafter she was found to have embolization of a Celt closure device to the right ventricle. She underwent a Coronary CTA which showed no calcifications in the coronary arteries and a metallic device in the right ventricle, trapped by the trabeculation of the right ventricle. No further intervention or work-up was recommended at that time. She subsequently underwent right internal jugular vein stenting 01/2020.  Before undergoing her TEE, she first had to undergo esophagoplasty due to dysphagia and esophageal stenosis (04/18/2020).     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   Macular rash 09/25/2020   Mastoiditis     Migraines    Nephrolithiasis    X5, Dr Jeffie Pollock, passed stones, no surgery   Peptic ulcer disease    Septic joint of left wrist ([place]) 09/25/2020   Von Willebrand disease ([place])    mild form per patient    Past Surgical History:  Procedure Laterality Date   APPENDECTOMY     cerebral stent Bilateral    jugular vein   CERVICAL FUSION  2018   C5-C7   DILITATION & CURRETTAGE/HYSTROSCOPY WITH NOVASURE ABLATION N/A 08/23/2015   Procedure: DILATATION & CURETTAGE/HYSTEROSCOPY WITH NOVASURE ABLATION;  Surgeon: Vanessa Kick, MD;  Location: [facility];  Service: Gynecology;  Laterality: N/A;   EXTRACORPOREAL SHOCK WAVE LITHOTRIPSY Left 03/19/2018   Procedure: EXTRACORPOREAL SHOCK WAVE LITHOTRIPSY (ESWL);  Surgeon: Bjorn Loser, MD;  Location: [facility];  Service: Urology;  Laterality: Left;   HAND TENDON SURGERY Left    left, complicated by MRSA   LAMINECTOMY  21016   T10-T12   spinal fluid leak patching     > 10X;[facility] - blood patching   SPINAL FUSION  2017   T10-T11   SPINAL FUSION  2018   T10-L1   TEE WITHOUT CARDIOVERSION N/A 05/17/2020   Procedure: TRANSESOPHAGEAL ECHOCARDIOGRAM (TEE);  Surgeon: Sanda Klein, MD;  Location: [facility];  Service: Cardiovascular;  Laterality: N/A;   WISDOM TOOTH EXTRACTION       Current Outpatient Medications  Medication Sig Dispense Refill   cyanocobalamin (,VITAMIN B-12,) 1000 MCG/ML injection Inject 1,000 mcg into the muscle every 30 (thirty) days.     famotidine (PEPCID) 20 MG tablet Take 1 tablet (20 mg total) by mouth at bedtime. 90 tablet 3   fluticasone (FLONASE) 50 MCG/ACT nasal spray Place 1 spray into both nostrils daily.     gabapentin (NEURONTIN) 300 MG capsule Take 300 mg by mouth 3 (three) times daily.     methocarbamol (ROBAXIN) 750 MG tablet Take 750 mg by mouth every 6 (six) hours as needed for muscle spasms.     metoprolol succinate (TOPROL-XL) 50 MG 24 hr tablet Take 1 tablet (50 mg total) by mouth daily. Take with or immediately  following a meal. (Patient taking differently: Take 50 mg by mouth at bedtime. Take with or immediately following a meal.) 90 tablet 3   nortriptyline (PAMELOR) 50 MG capsule Take 50 mg by mouth 2 (two) times daily.     omeprazole (PRILOSEC) 40 MG capsule Take 1 capsule (40 mg total) by mouth 2 (two) times daily. 180 capsule 3   ondansetron (ZOFRAN) 4 MG tablet Take 4 mg by mouth every 8 (eight) hours as needed for vomiting or nausea.     spironolactone (ALDACTONE) 25 MG tablet Take 25 mg by mouth at bedtime.     tiZANidine (ZANAFLEX) 2 MG tablet Take 2 mg by mouth every 6 (six) hours as needed for muscle spasms.     topiramate (TOPAMAX) 25 MG tablet Take 25 mg by mouth 3 (three) times daily.     traMADol (ULTRAM) 50 MG tablet Take 50-100 mg by mouth 4 (four) times daily as needed for moderate pain.  2   triamcinolone ointment (KENALOG) 0.5 % Apply 1 application topically 2 (two) times daily. 30 g 2  Vitamin D, Ergocalciferol, (DRISDOL) 1.25 MG (50000 UNIT) CAPS capsule Take 50,000 Units by mouth once a week.     furosemide (LASIX) 40 MG tablet Take 1 tablet (40 mg total) by mouth daily. 30 tablet 1   losartan (COZAAR) 100 MG tablet Take 1 tablet (100 mg total) by mouth daily. 90 tablet 3   No current facility-administered medications for this visit.    Allergies:   Acetazolamide, Other, Levofloxacin, Levofloxacin in d5w, and Sulfamethoxazole-trimethoprim    Social History:  The patient  reports that she has quit smoking. Her smoking use included cigarettes. She has a 3.00 pack-year smoking history. She has never used smokeless tobacco. She reports that she does not drink alcohol and does not use drugs.   Family History:  The patient's family history includes Arthritis in her father; Diabetes in her father, maternal grandmother, and paternal grandfather; Heart disease in her brother, maternal aunt, maternal grandfather, maternal grandmother, maternal uncle, and mother; Hyperlipidemia in her  father and paternal grandfather; Kidney disease in her father; Other in an other family member; Prostate cancer in her paternal grandfather; Stroke in her father; Stroke (age of onset: 45) in her maternal aunt.    ROS:  Please see the history of present illness.   Otherwise, review of systems are positive for none.   All other systems are reviewed and negative.    PHYSICAL EXAM: VS:  BP 110/70   Pulse 97   Resp 20   Ht '5\' 4"'  (1.626 m)   Wt 130 lb 6.4 oz (59.1 kg)   SpO2 100%   BMI 22.38 kg/m  , BMI Body mass index is 22.38 kg/m.   General: Alert, oriented x3, no distress, slender Head: no evidence of trauma, PERRL, EOMI, no exophtalmos or lid lag, no myxedema, no xanthelasma; normal ears, nose and oropharynx Neck: normal jugular venous pulsations and no hepatojugular reflux; brisk carotid pulses without delay and no carotid bruits Chest: clear to auscultation, no signs of consolidation by percussion or palpation, normal fremitus, symmetrical and full respiratory excursions Cardiovascular: normal position and quality of the apical impulse, regular rhythm, normal first and second heart sounds, 2/6 decrescendo aortic murmur heard up and down the right sternal border, faint accompanying systolic ejection murmur, no apical murmurs, rubs or gallops Abdomen: no tenderness or distention, no masses by palpation, no abnormal pulsatility or arterial bruits, normal bowel sounds, no hepatosplenomegaly Extremities: no clubbing, cyanosis or edema; 2+ radial, ulnar and brachial pulses bilaterally; 2+ right femoral, posterior tibial and dorsalis pedis pulses; 2+ left femoral, posterior tibial and dorsalis pedis pulses; no subclavian or femoral bruits Neurological: grossly nonfocal Psych: Normal mood and affect    EKG:  EKG is ordered today.  It shows sinus tachycardia and possible left atrial abnormality, but is otherwise a normal tracing.  Recent Labs: 02/18/2020: B Natriuretic Peptide 18.4 05/12/2020:  BUN 11; Creatinine, Ser 0.82; Hemoglobin 12.8; Platelets 311; Potassium 4.8; Sodium 141    Lipid Panel    Component Value Date/Time   CHOL 128 06/28/2016 1531   TRIG 98 06/28/2016 1531   HDL 48 06/28/2016 1531   LDLCALC 60 06/28/2016 1531      Wt Readings from Last 3 Encounters:  09/29/20 130 lb 6.4 oz (59.1 kg)  09/25/20 136 lb (61.7 kg)  05/23/20 125 lb (56.7 kg)      Other studies Reviewed: Additional studies/ records that were reviewed today include:  [facility] CTA Aorta 07/10/2020 1. No evidence of acute aortic syndrome or aneurysm.  There are no findings to suggest aortic thrombus.   2. Tricuspid aortic valve with leaflet thickening but no appreciable calcifications.   3. Approximately 50% stenosis of the proximal left subclavian artery by mixed plaque.   4. ACDF and T11-L1 lumbar fixation hardware are intact.  [facility] echocardiogram 07/03/2020  NORMAL LEFT VENTRICULAR SYSTOLIC FUNCTION    NORMAL LA PRESSURES WITH NORMAL DIASTOLIC FUNCTION    NORMAL RIGHT VENTRICULAR SYSTOLIC FUNCTION    VALVULAR REGURGITATION: TRIVIAL AR, MILD MR, MILD TR    NO VALVULAR STENOSIS    TACHYCARDIA THROUGHOUT IMAGING    NO VALVULAR VEGETATION NOT SEEN ON TODAY'S EXAM.    TRIVIAL AORTIC REGURGITATION NOTED ON TODAY'S EXAM WITH PHT = 653 ms.    NO PRIOR STUDY FOR COMPARISON  [facility] 3-day event monitor 09/24/2020 11:53 AM EDT    *The observed rhythms are sinus rhythm to sinus tachycardia. *The Maximum Heart Rate recorded was 146 bpm, Day 4 / 12:56:35 pm, the Minimum Heart Rate recorded was 64 bpm, Day 3 / 06:16:14 am and the Average Heart Rate was 94 bpm. *There were 13 PVCs  with a burden of < 0.01 %. *There were 20 PSVCs with a burden of < 0.01 %. There were 2 occurrences of Supraventricular Tachycardia with the longest episode 16 beats, Day 4 / 03:28:41 am and the fastest episode 123 bpm, Day 2 / 04:38:10 am. *There were  22 Patient reported events.  Echocardiogram 03/23/2020  1. Left  ventricular ejection fraction, by estimation, is 65 to 70%. Left  ventricular ejection fraction by 3D volume is 68 %. The left ventricle has  normal function. The left ventricle has no regional wall motion  abnormalities. Left ventricular diastolic   parameters are consistent with Grade I diastolic dysfunction (impaired  relaxation). The average left ventricular global longitudinal strain is  -24.1 %. The global longitudinal strain is normal.   2. Right ventricular systolic function is normal. The right ventricular  size is normal. There is normal pulmonary artery systolic pressure. The  estimated right ventricular systolic pressure is 69.6 mmHg.   3. The mitral valve is normal in structure. Trivial mitral valve  regurgitation. No evidence of mitral stenosis.   4. The aortic valve is calcified. There is moderate calcification of the  aortic valve. There is moderate thickening of the aortic valve. Aortic  valve regurgitation is moderate to severe. Aortic regurgitation PHT ranges  from 399to 270 msec. Mild to  moderate aortic valve sclerosis/calcification is present, without any  evidence of aortic stenosis.   5. The inferior vena cava is normal in size with greater than 50%  respiratory variability, suggesting right atrial pressure of 3 mmHg.   6. The patient has a hx of Ehler-Danlos syndrome with normal aortic root  and ascending aorta but the aortic arch and descending aorta appear  generous. Consider dedicated gated Chest CTA for further assessment.   7. Compared to prior echo, AI is now moderate to severe. Consider TEE for  further assessment.  Coronary CTA 01/2020: 1. No calcifications seen in the coronary arteries.  2. There is a metallic device in the right ventricle, trapped by the trabeculation of the right ventricle. Clinical correlation advised. Ordering physician notified.  TEE 05/18/2018   1. Left ventricular ejection fraction, by estimation, is 60 to 65%. The  left  ventricle has normal function. Left ventricular diastolic parameters  were normal.   2. Right ventricular systolic function is normal. The right ventricular  size is normal. There  is normal pulmonary artery systolic pressure. The  estimated right ventricular systolic pressure is 24.0 mmHg.   3. Left atrial size was mild to moderately dilated. No left atrial/left  atrial appendage thrombus was detected.   4. The mitral valve is myxomatous. Trivial mitral valve regurgitation.   5. The tricuspid valve is myxomatous.   6. There are fixed sessile, verrucous appearing echodensities on the  margins of all three aortic valve leaflets, particularly prominent on the  right and left cusps. They impair leaflet coaptation allowing for a  central regurgitant orifice.. The aortic  valve is tricuspid. There is moderate thickening of the aortic valve.  Aortic valve regurgitation is moderate to severe. No aortic stenosis is  present. Aortic regurgitation PHT measures 296 msec.   7. There is mild (Grade II) layered plaque involving the descending  aorta.   Conclusion(s)/Recommendation(s): The cause of the aortic valve changes is  unclear. Consider Libman-Sacks endocarditis or late changes of healed  bacterial endocarditis.   LEFT VENTRICLE  LVIDd:         4.19 cm  LVIDs:         2.89 cm      AORTIC VALVE  AI PHT:            296 msec  AR Vena Contracta: 0.43 cm     AORTA  Ao Root diam: 2.83 cm  Ao STJ diam:  2.7 cm  Ao Asc diam:  2.90 cm  Ao Arch diam: 1.9 cm  Ao Desc diam: 2.00 cm   ASSESSMENT AND PLAN:  1. Chronic diastolic heart failure ([place])   2. Nonrheumatic aortic (valve) insufficiency   3. Chest pain of uncertain etiology   4. Embolism involving cardiac device, subsequent encounter   5. Essential hypertension   6. Jugular vein stenosis   7. EDS (Ehlers-Danlos syndrome)   8. Raynaud's phenomenon without gangrene   9. Antinuclear factor positive       1. CHF: Presumptive  diagnosis based on response to treatment, but she has not undergone right heart catheterization and her BNP has been normal.  Appears clinically euvolemic.  Recently performed [facility] at [facility] was normal at only 90.  She has preserved left ventricular systolic function and the left ventricle is not dilated.  Blood pressure is better.  She is now on the maximum dose of losartan as well as spironolactone and low-dose furosemide.  Avoid excessive doses of beta-blocker since bradycardia could be disadvantageous with aortic insufficiency.  2. AI: Despite her history of Ehlers-Danlos syndrome, her aorta is normal in caliber and the aortic insufficiency is not due to aorta annular ectasia.  Instead, it appears to be postinflammatory it may be sequela of endocarditis that occurred 15 years ago, although the echo in 2016 described only mild aortic insufficiency.  She does have some autoimmune abnormalities and she has a scheduled appointment with rheumatology.  However, with a sedimentation rate of only 1, it is very unlikely that she has active inflammatory valvulitis.  Continue to monitor with yearly echocardiography.  Awaiting results of full rheumatological and genetic testing.  3. Chest pain/angina decubitus in patient with significant family history of CAD: Currently she is not describing these complaints.  Symptoms are not typical for exertional angina.  Suspect that this could have been AI-related angina. No evidence of pulmonary embolism or aortic dissection on  recent perform CT and TEE.   Coronary CTA 01/2020 at [facility] (to further evaluate device embolism to RV) was without coronary artery calcifications.  4. 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 No evidence of migration on CT chest 02/18/20.  Unlikely that any of her symptoms are due to this and suspect that by this point the device may be fully endothelialized.  5. HTN:  Improved.  Prefer use of losartan in case she does indeed have aortic connective disorder.  Avoid excessive bradycardia with aortic insufficiency.  6. 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 Followed by neurosurgery outpatient - Continue aspirin and clopidogrel.  If surgery is considered for her aortic valve, will have to discontinue the clopidogrel.  Although I do not have a lot of experience with stenting in the veins, suspect that this could be safely done after 6 months (by June 2022)  7. Ehlers-Danlos Syndrome: reportedly mother has EDS with vascular component as well ; genetic testing has been submitted  8. MVP: This was not confirmed on TEE  9.  Leg pain: Some symptoms suggestive of Raynaud's syndrome.  She describes pain in her legs with walking, pallor on dependency, cold feet, and occasional tingling/numbness in her feet, but lower extremity arterial ultrasound 03/31/2020 was negative for obstruction.  10. Positive ANA/anti-Ro: She has an appointment with [facility]ology at [facility]. She has normal renal function, no history of clotting disorders, no rashes or inflammatory joint complaints, obvious myositis.  Clinical significance remains unclear.     Current medicines are reviewed at length with the patient today.  The patient does not have concerns regarding medicines.  The following changes have been made:  As above  Labs/ tests ordered today include:   No orders of the defined types were placed in this encounter.  Patient Instructions  Medication Instructions:  No changes *If you need a refill on your cardiac medications before your next appointment, please call your pharmacy*   Lab Work: None ordered If you have labs (blood work) drawn today and your tests are completely normal, you will receive your results only by: [place] (if you have MyChart) OR A paper copy in the mail If you have any lab test that is  abnormal or we need to change your treatment, we will call you to review the results.   Testing/Procedures: None ordered   Follow-Up: At [facility], you and your health needs are our priority.  As part of our continuing mission to provide you with exceptional heart care, we have created designated Provider Care Teams.  These Care Teams include your primary Cardiologist (physician) and Advanced Practice Providers (APPs -  Physician Assistants and Nurse Practitioners) who all work together to provide you with the care you need, when you need it.  We recommend signing up for the patient portal called "MyChart".  Sign up information is provided on this After Visit Summary.  MyChart is used to connect with patients for Virtual Visits (Telemedicine).  Patients are able to view lab/test results, encounter notes, upcoming appointments, etc.  Non-urgent messages can be sent to your provider as well.   To learn more about what you can do with MyChart, go to [email or web address].    Your next appointment:   6 month(s)  The format for your next appointment:   In Person  Provider:   You may see Sanda Klein, MD or one of the following Advanced Practice Providers on your designated Care Team:   Almyra Deforest, PA-C Fabian Sharp, PA-C or  Roby Lofts, [place]  Signed, Sanda Klein, MD  09/30/2020 6:50 PM

## 2020-09-29 NOTE — Patient Instructions (Signed)

## 2020-09-30 ENCOUNTER — Encounter: Payer: Self-pay | Admitting: Cardiovascular Disease

## 2020-10-01 LAB — CULTURE, BLOOD (SINGLE)
MICRO NUMBER:: 12248310
MICRO NUMBER:: 12248311
Result:: NO GROWTH
Result:: NO GROWTH
SPECIMEN QUALITY:: ADEQUATE
SPECIMEN QUALITY:: ADEQUATE

## 2020-10-11 DIAGNOSIS — R519 Headache, unspecified: Secondary | ICD-10-CM | POA: Diagnosis not present

## 2020-10-11 DIAGNOSIS — G43909 Migraine, unspecified, not intractable, without status migrainosus: Secondary | ICD-10-CM | POA: Diagnosis not present

## 2020-10-11 DIAGNOSIS — I10 Essential (primary) hypertension: Secondary | ICD-10-CM | POA: Diagnosis not present

## 2020-10-11 DIAGNOSIS — J449 Chronic obstructive pulmonary disease, unspecified: Secondary | ICD-10-CM | POA: Diagnosis not present

## 2020-10-11 DIAGNOSIS — M159 Polyosteoarthritis, unspecified: Secondary | ICD-10-CM | POA: Diagnosis not present

## 2020-10-11 DIAGNOSIS — J329 Chronic sinusitis, unspecified: Secondary | ICD-10-CM | POA: Diagnosis not present

## 2020-10-17 DIAGNOSIS — Z888 Allergy status to other drugs, medicaments and biological substances status: Secondary | ICD-10-CM | POA: Diagnosis not present

## 2020-10-17 DIAGNOSIS — Q796 Ehlers-Danlos syndrome, unspecified: Secondary | ICD-10-CM | POA: Diagnosis not present

## 2020-10-17 DIAGNOSIS — Z881 Allergy status to other antibiotic agents status: Secondary | ICD-10-CM | POA: Diagnosis not present

## 2020-10-17 DIAGNOSIS — E507 Other ocular manifestations of vitamin A deficiency: Secondary | ICD-10-CM | POA: Diagnosis not present

## 2020-10-17 DIAGNOSIS — R768 Other specified abnormal immunological findings in serum: Secondary | ICD-10-CM | POA: Diagnosis not present

## 2020-10-17 DIAGNOSIS — I351 Nonrheumatic aortic (valve) insufficiency: Secondary | ICD-10-CM | POA: Diagnosis not present

## 2020-10-17 DIAGNOSIS — R760 Raised antibody titer: Secondary | ICD-10-CM | POA: Diagnosis not present

## 2020-10-17 DIAGNOSIS — R21 Rash and other nonspecific skin eruption: Secondary | ICD-10-CM | POA: Diagnosis not present

## 2020-10-17 DIAGNOSIS — K117 Disturbances of salivary secretion: Secondary | ICD-10-CM | POA: Diagnosis not present

## 2020-10-17 DIAGNOSIS — I7300 Raynaud's syndrome without gangrene: Secondary | ICD-10-CM | POA: Diagnosis not present

## 2020-10-17 DIAGNOSIS — Z8614 Personal history of Methicillin resistant Staphylococcus aureus infection: Secondary | ICD-10-CM | POA: Diagnosis not present

## 2020-10-17 DIAGNOSIS — Z87891 Personal history of nicotine dependence: Secondary | ICD-10-CM | POA: Diagnosis not present

## 2020-10-17 DIAGNOSIS — Z8679 Personal history of other diseases of the circulatory system: Secondary | ICD-10-CM | POA: Diagnosis not present

## 2020-10-17 DIAGNOSIS — G43909 Migraine, unspecified, not intractable, without status migrainosus: Secondary | ICD-10-CM | POA: Diagnosis not present

## 2020-10-23 DIAGNOSIS — M3500 Sicca syndrome, unspecified: Secondary | ICD-10-CM | POA: Diagnosis not present

## 2020-10-23 DIAGNOSIS — G932 Benign intracranial hypertension: Secondary | ICD-10-CM | POA: Diagnosis not present

## 2020-10-26 DIAGNOSIS — U071 COVID-19: Secondary | ICD-10-CM | POA: Diagnosis not present

## 2020-11-10 ENCOUNTER — Ambulatory Visit: Payer: Medicare Other | Admitting: Infectious Disease

## 2020-11-10 DIAGNOSIS — J449 Chronic obstructive pulmonary disease, unspecified: Secondary | ICD-10-CM | POA: Diagnosis not present

## 2020-11-10 DIAGNOSIS — M159 Polyosteoarthritis, unspecified: Secondary | ICD-10-CM | POA: Diagnosis not present

## 2020-11-17 ENCOUNTER — Other Ambulatory Visit: Payer: Self-pay

## 2020-11-17 ENCOUNTER — Ambulatory Visit: Payer: Medicare Other | Admitting: Infectious Disease

## 2020-11-17 VITALS — BP 124/79 | HR 96 | Resp 16 | Ht 64.0 in | Wt 132.0 lb

## 2020-11-17 DIAGNOSIS — Z7185 Encounter for immunization safety counseling: Secondary | ICD-10-CM

## 2020-11-17 DIAGNOSIS — I358 Other nonrheumatic aortic valve disorders: Secondary | ICD-10-CM | POA: Diagnosis not present

## 2020-11-17 DIAGNOSIS — G9600 Cerebrospinal fluid leak, unspecified: Secondary | ICD-10-CM

## 2020-11-17 DIAGNOSIS — R7881 Bacteremia: Secondary | ICD-10-CM

## 2020-11-17 DIAGNOSIS — B9562 Methicillin resistant Staphylococcus aureus infection as the cause of diseases classified elsewhere: Secondary | ICD-10-CM | POA: Diagnosis not present

## 2020-11-17 DIAGNOSIS — D68318 Other hemorrhagic disorder due to intrinsic circulating anticoagulants, antibodies, or inhibitors: Secondary | ICD-10-CM

## 2020-11-17 DIAGNOSIS — T82817D Embolism of cardiac prosthetic devices, implants and grafts, subsequent encounter: Secondary | ICD-10-CM

## 2020-11-17 DIAGNOSIS — M3211 Endocarditis in systemic lupus erythematosus: Secondary | ICD-10-CM | POA: Diagnosis not present

## 2020-11-17 DIAGNOSIS — G932 Benign intracranial hypertension: Secondary | ICD-10-CM

## 2020-11-17 DIAGNOSIS — R21 Rash and other nonspecific skin eruption: Secondary | ICD-10-CM | POA: Diagnosis not present

## 2020-11-17 NOTE — Progress Notes (Signed)
Subjective:   Chief complaint follow-up for rash that I prescribed triamcinolone for as well as her endocarditis     Patient ID: Lorraine Weber, female    DOB: 11-Jun-1980, 40 y.o.   MRN: [id number]  HPI  Lorraine Weber is a very nice 40 year old who has had a complicated medical history including history of von Willebrand's disease, prior brain injury, chronic headaches with intracranial hypertension requiring lumbar puncture, Ehlers-Danlos syndrome hypertension  and family history of aortic dissection had developed worsening exertional dyspnea and was found in April to have very aortic insufficiency.  She underwent transesophageal echocardiogram on May 17, 2020 with Dr. Sallyanne Kuster was found to have vegetations on all 3 aortic leaflets.  He felt that the appearance was suggestive of Libman-Sacks endocarditis.  Patient had an ANA checked which was positive and also an increase in SSA antibodies though segmentation rate was normal normal anticardiolipin antibodies were negative. He also noted that it was possible that these lesions were from prior bacteremia--and in fact the patient DID have MRSA bacteremia while in [place] in 2006 with septic wrist after elective surgery that then required I&D and protracted IV antibiotics.  Is also seen by my partner Dr. Johnnye Sima in 2012 when she had an infection of a Botox injection site on her face.   Been referred to [facility] CT surgery and seen there by Dr Ysidro Evert along with Cardiology with Dr. Sheila Oats.   She has hardware in spine placed by Dr. Owens Shark with Orthopedics also at [facility] that has recently become more and more palpable in the context of weight loss.  Is developed a rash on her back that started apparently with a lumbar puncture site that became inflamed in her lower back.  Since then she has had other lesions develop on her upper back largely between her scapula they are very pruritic and I suspect she may be scratching them due to the intense  itching that they cause at times.  She haf been given several rounds of antibiotics with the presumption that this might be a staphylococcal infection which she has had in the past.  She says that when she took doxycycline this made no impact on her rash nor did clindamycin.  I did not find evidence of infection but instead thought that she was having more of an allergic reaction and prescribed topical triamcinolone which has resulted in improvement in her rash.  With regards to her endocarditis we did blood cultures which did not yield any organisms.  I also discussed this case with Dr. Orene Desanctis who believes patient either had valvular damage from prior bacteremia and endocarditis vs a Martie Lee type endocarditis.         Past Medical History:  Diagnosis Date   Anemia    Aortic valve endocarditis 09/25/2020   Clotting disorder ([place])    Von Willebrand    Concussion    x8, last 2010, residual homonymous Hemianopsia   CSF leak    multiple serum pathes   Frequent sinus infections    tx with flonase nasal spray   GERD (gastroesophageal reflux disease)    History of blood transfusion    over 5 yrs ago   Hypertension    Rudene Christians endocarditis ([place]) 09/25/2020   Macular rash 09/25/2020   Mastoiditis    Migraines    Nephrolithiasis    X5, Dr Jeffie Pollock, passed stones, no surgery   Peptic ulcer disease    Septic joint of left wrist ([place]) 09/25/2020  Von Willebrand disease ([place])    mild form per patient    Past Surgical History:  Procedure Laterality Date   APPENDECTOMY     cerebral stent Bilateral    jugular vein   CERVICAL FUSION  2018   C5-C7   DILITATION & CURRETTAGE/HYSTROSCOPY WITH NOVASURE ABLATION N/A 08/23/2015   Procedure: DILATATION & CURETTAGE/HYSTEROSCOPY WITH NOVASURE ABLATION;  Surgeon: Vanessa Kick, MD;  Location: [facility];  Service: Gynecology;  Laterality: N/A;   EXTRACORPOREAL SHOCK WAVE LITHOTRIPSY Left 03/19/2018   Procedure: EXTRACORPOREAL SHOCK WAVE  LITHOTRIPSY (ESWL);  Surgeon: Bjorn Loser, MD;  Location: [facility];  Service: Urology;  Laterality: Left;   HAND TENDON SURGERY Left    left, complicated by MRSA   LAMINECTOMY  21016   T10-T12   spinal fluid leak patching     > 10X;[facility] - blood patching   SPINAL FUSION  2017   T10-T11   SPINAL FUSION  2018   T10-L1   TEE WITHOUT CARDIOVERSION N/A 05/17/2020   Procedure: TRANSESOPHAGEAL ECHOCARDIOGRAM (TEE);  Surgeon: Sanda Klein, MD;  Location: [facility];  Service: Cardiovascular;  Laterality: N/A;   WISDOM TOOTH EXTRACTION      Family History  Problem Relation Age of Onset   Arthritis Father    Hyperlipidemia Father    Diabetes Father    Stroke Father        PTE post CVA   Kidney disease Father    Prostate cancer Paternal Grandfather    Hyperlipidemia Paternal Grandfather    Diabetes Paternal Grandfather    Heart disease Mother        ?hypertrophic idiopathic subaortic stenosis   Heart disease Maternal Grandmother    Diabetes Maternal Grandmother    Heart disease Maternal Grandfather        HISS   Heart disease Maternal Aunt        HISS   Stroke Maternal Aunt 50   Heart disease Maternal Uncle    Heart disease Brother        HISS   Other Other        EDS in M, bro & niece   Stomach cancer Neg Hx    Colon cancer Neg Hx    Rectal cancer Neg Hx    Ovarian cancer Neg Hx       Social History   Socioeconomic History   Marital status: Married    Spouse name: Not on file   Number of children: 0   Years of education: Not on file   Highest education level: Not on file  Occupational History   Occupation: [employer]    Employer: [employer]   Occupation: disabled  Tobacco Use   Smoking status: Former    Packs/day: 0.30    Years: 10.00    Pack years: 3.00    Types: Cigarettes   Smokeless tobacco: Never   Tobacco comments:    smoked age 70-present, up to 1 ppd.05/24/14 1/2 ppd  Vaping Use   Vaping Use: Never used  Substance and Sexual  Activity   Alcohol use: No    Alcohol/week: 0.0 standard drinks   Drug use: No   Sexual activity: Yes    Partners: Male    Birth control/protection: None    Comment: IUD removed 06/2015  Other Topics Concern   Not on file  Social History Narrative   Not on file   Social Determinants of Health   Financial Resource Strain: Not on file  Food Insecurity: Not  on file  Transportation Needs: Not on file  Physical Activity: Not on file  Stress: Not on file  Social Connections: Not on file    Allergies  Allergen Reactions   Acetazolamide Shortness Of Breath   Other Other (See Comments)    Pt Instructed by hematologist not to take blood thinners  ALL BLOOD THINNERS-CANNOT TAKE DUE TO VON WILDEBRAND'S    Levofloxacin Itching and Rash   Levofloxacin In D5w Swelling and Rash   Sulfamethoxazole-Trimethoprim Rash    "Body Aches"     Current Outpatient Medications:    cyanocobalamin (,VITAMIN B-12,) 1000 MCG/ML injection, Inject 1,000 mcg into the muscle every 30 (thirty) days., Disp: , Rfl:    famotidine (PEPCID) 20 MG tablet, Take 1 tablet (20 mg total) by mouth at bedtime., Disp: 90 tablet, Rfl: 3   fluticasone (FLONASE) 50 MCG/ACT nasal spray, Place 1 spray into both nostrils daily., Disp: , Rfl:    furosemide (LASIX) 40 MG tablet, Take 1 tablet (40 mg total) by mouth daily., Disp: 30 tablet, Rfl: 1   gabapentin (NEURONTIN) 300 MG capsule, Take 300 mg by mouth 3 (three) times daily., Disp: , Rfl:    losartan (COZAAR) 100 MG tablet, Take 1 tablet (100 mg total) by mouth daily., Disp: 90 tablet, Rfl: 3   methocarbamol (ROBAXIN) 750 MG tablet, Take 750 mg by mouth every 6 (six) hours as needed for muscle spasms., Disp: , Rfl:    metoprolol succinate (TOPROL-XL) 50 MG 24 hr tablet, Take 1 tablet (50 mg total) by mouth daily. Take with or immediately following a meal. (Patient taking differently: Take 50 mg by mouth at bedtime. Take with or immediately following a meal.), Disp: 90 tablet,  Rfl: 3   nortriptyline (PAMELOR) 50 MG capsule, Take 50 mg by mouth 2 (two) times daily., Disp: , Rfl:    omeprazole (PRILOSEC) 40 MG capsule, Take 1 capsule (40 mg total) by mouth 2 (two) times daily., Disp: 180 capsule, Rfl: 3   ondansetron (ZOFRAN) 4 MG tablet, Take 4 mg by mouth every 8 (eight) hours as needed for vomiting or nausea., Disp: , Rfl:    spironolactone (ALDACTONE) 25 MG tablet, Take 25 mg by mouth at bedtime., Disp: , Rfl:    tiZANidine (ZANAFLEX) 2 MG tablet, Take 2 mg by mouth every 6 (six) hours as needed for muscle spasms., Disp: , Rfl:    topiramate (TOPAMAX) 25 MG tablet, Take 25 mg by mouth 3 (three) times daily., Disp: , Rfl:    traMADol (ULTRAM) 50 MG tablet, Take 50-100 mg by mouth 4 (four) times daily as needed for moderate pain., Disp: , Rfl: 2   triamcinolone ointment (KENALOG) 0.5 %, Apply 1 application topically 2 (two) times daily., Disp: 30 g, Rfl: 2   Vitamin D, Ergocalciferol, (DRISDOL) 1.25 MG (50000 UNIT) CAPS capsule, Take 50,000 Units by mouth once a week., Disp: , Rfl:     Review of Systems  Constitutional:  Negative for activity change, appetite change, chills, diaphoresis, fatigue, fever and unexpected weight change.  HENT:  Negative for congestion, rhinorrhea, sinus pressure, sneezing, sore throat and trouble swallowing.   Eyes:  Negative for photophobia and visual disturbance.  Respiratory:  Negative for cough, chest tightness, shortness of breath, wheezing and stridor.   Cardiovascular:  Negative for chest pain, palpitations and leg swelling.  Gastrointestinal:  Negative for abdominal distention, abdominal pain, anal bleeding, blood in stool, constipation, diarrhea, nausea and vomiting.  Genitourinary:  Negative for difficulty urinating, dysuria, flank  pain and hematuria.  Musculoskeletal:  Negative for arthralgias, back pain, gait problem, joint swelling and myalgias.  Skin:  Positive for rash. Negative for color change, pallor and wound.   Neurological:  Positive for headaches. Negative for dizziness, tremors, weakness and light-headedness.  Hematological:  Negative for adenopathy. Does not bruise/bleed easily.  Psychiatric/Behavioral:  Negative for agitation, behavioral problems, confusion, decreased concentration, dysphoric mood and sleep disturbance.       Objective:   Physical Exam Constitutional:      General: She is not in acute distress.    Appearance: Normal appearance. She is well-developed. She is not ill-appearing or diaphoretic.  HENT:     Head: Normocephalic and atraumatic.     Right Ear: Hearing and external ear normal.     Left Ear: Hearing and external ear normal.     Nose: No nasal deformity or rhinorrhea.  Eyes:     General: No scleral icterus.    Extraocular Movements: Extraocular movements intact.     Conjunctiva/sclera: Conjunctivae normal.     Right eye: Right conjunctiva is not injected.     Left eye: Left conjunctiva is not injected.     Pupils: Pupils are equal, round, and reactive to light.  Neck:     Vascular: No JVD.  Cardiovascular:     Rate and Rhythm: Normal rate and regular rhythm.     Heart sounds: S1 normal and S2 normal.  Pulmonary:     Effort: Pulmonary effort is normal. No respiratory distress.     Breath sounds: No wheezing.  Abdominal:     General: There is no distension.     Palpations: Abdomen is soft.  Musculoskeletal:        General: Normal range of motion.     Right shoulder: Normal.     Left shoulder: Normal.     Cervical back: Normal range of motion and neck supple.     Right hip: Normal.     Left hip: Normal.     Right knee: Normal.     Left knee: Normal.  Lymphadenopathy:     Head:     Right side of head: No submandibular, preauricular or posterior auricular adenopathy.     Left side of head: No submandibular, preauricular or posterior auricular adenopathy.     Cervical: No cervical adenopathy.     Right cervical: No superficial or deep cervical  adenopathy.    Left cervical: No superficial or deep cervical adenopathy.  Skin:    General: Skin is warm and dry.     Coloration: Skin is not pale.     Findings: No abrasion, bruising, ecchymosis, erythema, lesion or rash.     Nails: There is no clubbing.  Neurological:     General: No focal deficit present.     Mental Status: She is alert and oriented to person, place, and time.     Sensory: No sensory deficit.     Coordination: Coordination normal.     Gait: Gait normal.  Psychiatric:        Attention and Perception: She is attentive.        Mood and Affect: Mood normal.        Speech: Speech normal.        Behavior: Behavior normal. Behavior is cooperative.        Thought Content: Thought content normal.        Judgment: Judgment normal.    Rash on her back 09/25/2020:  Rash today 11/17/2020:        Assessment & Plan:   Rash on back: This is clearly not an infectious process but more of a dermatitis that is responding to corticosteroid therapy topically  Severe aortic insufficiency with Libman-Sacks endocarditis versus damage to the heart valve from prior bloodstream infection with endocarditis with a pyogenic bacteria  She is being followed closely by cardiology and cardiothoracic surgery  Severe aortic insufficiency with what sounds like a Libman-Sacks type endocarditis seen on transesophageal echocardiogram:   Evaded intracranial pressures: Now that her CSF leak has stopped she is actually developed elevated cranial pressures which she apparently has had chronically and she is going to have neurosurgery with a shunt sometime in the next few weeks.  Vaccine counseling I offered to have her give given COVID-19 updated bivalent vaccine --but we did not have an open container at end of this clinic. We did give her influenza vaccine.

## 2020-11-27 DIAGNOSIS — M3504 Sicca syndrome with tubulo-interstitial nephropathy: Secondary | ICD-10-CM | POA: Diagnosis not present

## 2020-12-04 ENCOUNTER — Other Ambulatory Visit: Payer: Self-pay | Admitting: Infectious Disease

## 2020-12-04 DIAGNOSIS — G932 Benign intracranial hypertension: Secondary | ICD-10-CM | POA: Diagnosis not present

## 2020-12-04 DIAGNOSIS — Z01818 Encounter for other preprocedural examination: Secondary | ICD-10-CM | POA: Diagnosis not present

## 2020-12-05 DIAGNOSIS — R202 Paresthesia of skin: Secondary | ICD-10-CM | POA: Diagnosis not present

## 2020-12-11 DIAGNOSIS — M159 Polyosteoarthritis, unspecified: Secondary | ICD-10-CM | POA: Diagnosis not present

## 2020-12-11 DIAGNOSIS — J449 Chronic obstructive pulmonary disease, unspecified: Secondary | ICD-10-CM | POA: Diagnosis not present

## 2020-12-12 HISTORY — PX: VENTRICULOPERITONEAL SHUNT: SHX204

## 2020-12-15 DIAGNOSIS — Z888 Allergy status to other drugs, medicaments and biological substances status: Secondary | ICD-10-CM | POA: Diagnosis not present

## 2020-12-15 DIAGNOSIS — Z79899 Other long term (current) drug therapy: Secondary | ICD-10-CM | POA: Diagnosis not present

## 2020-12-15 DIAGNOSIS — G932 Benign intracranial hypertension: Secondary | ICD-10-CM | POA: Diagnosis not present

## 2020-12-15 DIAGNOSIS — Z881 Allergy status to other antibiotic agents status: Secondary | ICD-10-CM | POA: Diagnosis not present

## 2020-12-15 DIAGNOSIS — I10 Essential (primary) hypertension: Secondary | ICD-10-CM | POA: Diagnosis not present

## 2020-12-15 DIAGNOSIS — Z8249 Family history of ischemic heart disease and other diseases of the circulatory system: Secondary | ICD-10-CM | POA: Diagnosis not present

## 2020-12-15 DIAGNOSIS — K219 Gastro-esophageal reflux disease without esophagitis: Secondary | ICD-10-CM | POA: Diagnosis not present

## 2020-12-15 DIAGNOSIS — G43909 Migraine, unspecified, not intractable, without status migrainosus: Secondary | ICD-10-CM | POA: Diagnosis not present

## 2020-12-15 DIAGNOSIS — Z823 Family history of stroke: Secondary | ICD-10-CM | POA: Diagnosis not present

## 2020-12-15 DIAGNOSIS — G4489 Other headache syndrome: Secondary | ICD-10-CM | POA: Diagnosis not present

## 2020-12-15 DIAGNOSIS — Z981 Arthrodesis status: Secondary | ICD-10-CM | POA: Diagnosis not present

## 2020-12-15 DIAGNOSIS — Z87891 Personal history of nicotine dependence: Secondary | ICD-10-CM | POA: Diagnosis not present

## 2020-12-19 DIAGNOSIS — Z982 Presence of cerebrospinal fluid drainage device: Secondary | ICD-10-CM | POA: Diagnosis not present

## 2020-12-19 DIAGNOSIS — Z4541 Encounter for adjustment and management of cerebrospinal fluid drainage device: Secondary | ICD-10-CM | POA: Diagnosis not present

## 2020-12-19 DIAGNOSIS — Z9889 Other specified postprocedural states: Secondary | ICD-10-CM | POA: Diagnosis not present

## 2020-12-27 DIAGNOSIS — Z4541 Encounter for adjustment and management of cerebrospinal fluid drainage device: Secondary | ICD-10-CM | POA: Diagnosis not present

## 2020-12-27 DIAGNOSIS — Z982 Presence of cerebrospinal fluid drainage device: Secondary | ICD-10-CM | POA: Diagnosis not present

## 2020-12-27 DIAGNOSIS — R519 Headache, unspecified: Secondary | ICD-10-CM | POA: Diagnosis not present

## 2020-12-27 DIAGNOSIS — Z5189 Encounter for other specified aftercare: Secondary | ICD-10-CM | POA: Diagnosis not present

## 2021-01-03 DIAGNOSIS — Z48811 Encounter for surgical aftercare following surgery on the nervous system: Secondary | ICD-10-CM | POA: Diagnosis not present

## 2021-01-03 DIAGNOSIS — Z982 Presence of cerebrospinal fluid drainage device: Secondary | ICD-10-CM | POA: Diagnosis not present

## 2021-01-03 DIAGNOSIS — G932 Benign intracranial hypertension: Secondary | ICD-10-CM | POA: Diagnosis not present

## 2021-01-10 DIAGNOSIS — J449 Chronic obstructive pulmonary disease, unspecified: Secondary | ICD-10-CM | POA: Diagnosis not present

## 2021-01-10 DIAGNOSIS — M159 Polyosteoarthritis, unspecified: Secondary | ICD-10-CM | POA: Diagnosis not present

## 2021-01-19 DIAGNOSIS — R519 Headache, unspecified: Secondary | ICD-10-CM | POA: Diagnosis not present

## 2021-01-19 DIAGNOSIS — H93A1 Pulsatile tinnitus, right ear: Secondary | ICD-10-CM | POA: Diagnosis not present

## 2021-01-19 DIAGNOSIS — Z982 Presence of cerebrospinal fluid drainage device: Secondary | ICD-10-CM | POA: Diagnosis not present

## 2021-01-19 DIAGNOSIS — R112 Nausea with vomiting, unspecified: Secondary | ICD-10-CM | POA: Diagnosis not present

## 2021-01-19 DIAGNOSIS — Z4541 Encounter for adjustment and management of cerebrospinal fluid drainage device: Secondary | ICD-10-CM | POA: Diagnosis not present

## 2021-01-23 ENCOUNTER — Encounter: Payer: Self-pay | Admitting: Cardiovascular Disease

## 2021-01-23 DIAGNOSIS — E507 Other ocular manifestations of vitamin A deficiency: Secondary | ICD-10-CM | POA: Diagnosis not present

## 2021-01-23 DIAGNOSIS — G43909 Migraine, unspecified, not intractable, without status migrainosus: Secondary | ICD-10-CM | POA: Diagnosis not present

## 2021-01-23 DIAGNOSIS — B078 Other viral warts: Secondary | ICD-10-CM | POA: Diagnosis not present

## 2021-01-23 DIAGNOSIS — I7300 Raynaud's syndrome without gangrene: Secondary | ICD-10-CM | POA: Diagnosis not present

## 2021-01-23 DIAGNOSIS — J329 Chronic sinusitis, unspecified: Secondary | ICD-10-CM | POA: Diagnosis not present

## 2021-01-23 DIAGNOSIS — Z888 Allergy status to other drugs, medicaments and biological substances status: Secondary | ICD-10-CM | POA: Diagnosis not present

## 2021-01-23 DIAGNOSIS — R202 Paresthesia of skin: Secondary | ICD-10-CM | POA: Diagnosis not present

## 2021-01-23 DIAGNOSIS — Z982 Presence of cerebrospinal fluid drainage device: Secondary | ICD-10-CM | POA: Diagnosis not present

## 2021-01-23 DIAGNOSIS — Z9889 Other specified postprocedural states: Secondary | ICD-10-CM | POA: Diagnosis not present

## 2021-01-23 DIAGNOSIS — Z881 Allergy status to other antibiotic agents status: Secondary | ICD-10-CM | POA: Diagnosis not present

## 2021-01-23 DIAGNOSIS — G932 Benign intracranial hypertension: Secondary | ICD-10-CM | POA: Diagnosis not present

## 2021-01-23 DIAGNOSIS — Z8669 Personal history of other diseases of the nervous system and sense organs: Secondary | ICD-10-CM | POA: Diagnosis not present

## 2021-01-23 DIAGNOSIS — Z8679 Personal history of other diseases of the circulatory system: Secondary | ICD-10-CM | POA: Diagnosis not present

## 2021-01-23 DIAGNOSIS — R768 Other specified abnormal immunological findings in serum: Secondary | ICD-10-CM | POA: Diagnosis not present

## 2021-01-23 DIAGNOSIS — K117 Disturbances of salivary secretion: Secondary | ICD-10-CM | POA: Diagnosis not present

## 2021-01-23 DIAGNOSIS — R760 Raised antibody titer: Secondary | ICD-10-CM | POA: Diagnosis not present

## 2021-01-24 MED ORDER — AMLODIPINE BESYLATE 2.5 MG PO TABS: 2.5000 mg | ORAL_TABLET | Freq: Every day | ORAL | 3 refills | Status: DC

## 2021-01-29 DIAGNOSIS — Z5181 Encounter for therapeutic drug level monitoring: Secondary | ICD-10-CM | POA: Diagnosis not present

## 2021-01-29 DIAGNOSIS — R202 Paresthesia of skin: Secondary | ICD-10-CM | POA: Diagnosis not present

## 2021-01-29 DIAGNOSIS — Z79899 Other long term (current) drug therapy: Secondary | ICD-10-CM | POA: Diagnosis not present

## 2021-02-14 DIAGNOSIS — Z4541 Encounter for adjustment and management of cerebrospinal fluid drainage device: Secondary | ICD-10-CM | POA: Diagnosis not present

## 2021-02-28 DIAGNOSIS — Z4541 Encounter for adjustment and management of cerebrospinal fluid drainage device: Secondary | ICD-10-CM | POA: Diagnosis not present

## 2021-03-08 DIAGNOSIS — R519 Headache, unspecified: Secondary | ICD-10-CM | POA: Diagnosis not present

## 2021-03-08 DIAGNOSIS — Z982 Presence of cerebrospinal fluid drainage device: Secondary | ICD-10-CM | POA: Diagnosis not present

## 2021-03-08 DIAGNOSIS — G932 Benign intracranial hypertension: Secondary | ICD-10-CM | POA: Diagnosis not present

## 2021-03-13 DIAGNOSIS — M159 Polyosteoarthritis, unspecified: Secondary | ICD-10-CM | POA: Diagnosis not present

## 2021-03-13 DIAGNOSIS — J449 Chronic obstructive pulmonary disease, unspecified: Secondary | ICD-10-CM | POA: Diagnosis not present

## 2021-03-14 DIAGNOSIS — H6980 Other specified disorders of Eustachian tube, unspecified ear: Secondary | ICD-10-CM | POA: Diagnosis not present

## 2021-03-14 DIAGNOSIS — Z6824 Body mass index (BMI) 24.0-24.9, adult: Secondary | ICD-10-CM | POA: Diagnosis not present

## 2021-03-14 DIAGNOSIS — E063 Autoimmune thyroiditis: Secondary | ICD-10-CM | POA: Diagnosis not present

## 2021-03-14 DIAGNOSIS — Z982 Presence of cerebrospinal fluid drainage device: Secondary | ICD-10-CM | POA: Diagnosis not present

## 2021-03-14 DIAGNOSIS — L0292 Furuncle, unspecified: Secondary | ICD-10-CM | POA: Diagnosis not present

## 2021-03-14 DIAGNOSIS — I10 Essential (primary) hypertension: Secondary | ICD-10-CM | POA: Diagnosis not present

## 2021-03-16 DIAGNOSIS — Z4541 Encounter for adjustment and management of cerebrospinal fluid drainage device: Secondary | ICD-10-CM | POA: Diagnosis not present

## 2021-03-16 DIAGNOSIS — T859XXA Unspecified complication of internal prosthetic device, implant and graft, initial encounter: Secondary | ICD-10-CM | POA: Diagnosis not present

## 2021-03-16 DIAGNOSIS — G932 Benign intracranial hypertension: Secondary | ICD-10-CM | POA: Diagnosis not present

## 2021-03-19 DIAGNOSIS — Z982 Presence of cerebrospinal fluid drainage device: Secondary | ICD-10-CM | POA: Diagnosis not present

## 2021-03-19 DIAGNOSIS — Z4541 Encounter for adjustment and management of cerebrospinal fluid drainage device: Secondary | ICD-10-CM | POA: Diagnosis not present

## 2021-03-19 DIAGNOSIS — G932 Benign intracranial hypertension: Secondary | ICD-10-CM | POA: Diagnosis not present

## 2021-03-26 DIAGNOSIS — M359 Systemic involvement of connective tissue, unspecified: Secondary | ICD-10-CM | POA: Diagnosis not present

## 2021-03-26 DIAGNOSIS — R21 Rash and other nonspecific skin eruption: Secondary | ICD-10-CM | POA: Diagnosis not present

## 2021-04-04 DIAGNOSIS — Z01818 Encounter for other preprocedural examination: Secondary | ICD-10-CM | POA: Diagnosis not present

## 2021-04-04 DIAGNOSIS — G932 Benign intracranial hypertension: Secondary | ICD-10-CM | POA: Diagnosis not present

## 2021-04-13 DIAGNOSIS — I10 Essential (primary) hypertension: Secondary | ICD-10-CM | POA: Diagnosis not present

## 2021-04-13 DIAGNOSIS — K219 Gastro-esophageal reflux disease without esophagitis: Secondary | ICD-10-CM | POA: Diagnosis not present

## 2021-04-13 DIAGNOSIS — T8501XA Breakdown (mechanical) of ventricular intracranial (communicating) shunt, initial encounter: Secondary | ICD-10-CM | POA: Diagnosis not present

## 2021-04-13 DIAGNOSIS — Z4541 Encounter for adjustment and management of cerebrospinal fluid drainage device: Secondary | ICD-10-CM | POA: Diagnosis not present

## 2021-04-13 DIAGNOSIS — G932 Benign intracranial hypertension: Secondary | ICD-10-CM | POA: Diagnosis not present

## 2021-05-02 DIAGNOSIS — G932 Benign intracranial hypertension: Secondary | ICD-10-CM | POA: Diagnosis not present

## 2021-05-02 DIAGNOSIS — Z4541 Encounter for adjustment and management of cerebrospinal fluid drainage device: Secondary | ICD-10-CM | POA: Diagnosis not present

## 2021-05-02 DIAGNOSIS — Z982 Presence of cerebrospinal fluid drainage device: Secondary | ICD-10-CM | POA: Diagnosis not present

## 2021-05-03 ENCOUNTER — Other Ambulatory Visit: Payer: Self-pay | Admitting: Cardiovascular Disease

## 2021-05-03 DIAGNOSIS — Z1231 Encounter for screening mammogram for malignant neoplasm of breast: Secondary | ICD-10-CM | POA: Diagnosis not present

## 2021-05-08 DIAGNOSIS — J018 Other acute sinusitis: Secondary | ICD-10-CM | POA: Diagnosis not present

## 2021-05-11 DIAGNOSIS — J449 Chronic obstructive pulmonary disease, unspecified: Secondary | ICD-10-CM | POA: Diagnosis not present

## 2021-05-11 DIAGNOSIS — M159 Polyosteoarthritis, unspecified: Secondary | ICD-10-CM | POA: Diagnosis not present

## 2021-05-12 HISTORY — PX: SHUNT REMOVAL: SHX342

## 2021-05-13 ENCOUNTER — Emergency Department (HOSPITAL_BASED_OUTPATIENT_CLINIC_OR_DEPARTMENT_OTHER): Payer: Medicare Other

## 2021-05-13 ENCOUNTER — Emergency Department (HOSPITAL_BASED_OUTPATIENT_CLINIC_OR_DEPARTMENT_OTHER)
Admission: EM | Admit: 2021-05-13 | Discharge: 2021-05-13 | Disposition: A | Discharge status: Home / Self Care | Payer: Medicare Other | Attending: Emergency Medicine | Admitting: Emergency Medicine

## 2021-05-13 ENCOUNTER — Encounter (HOSPITAL_BASED_OUTPATIENT_CLINIC_OR_DEPARTMENT_OTHER): Payer: Self-pay

## 2021-05-13 ENCOUNTER — Other Ambulatory Visit: Payer: Self-pay

## 2021-05-13 DIAGNOSIS — R110 Nausea: Secondary | ICD-10-CM | POA: Diagnosis not present

## 2021-05-13 DIAGNOSIS — Z79899 Other long term (current) drug therapy: Secondary | ICD-10-CM | POA: Diagnosis not present

## 2021-05-13 DIAGNOSIS — N200 Calculus of kidney: Secondary | ICD-10-CM | POA: Diagnosis not present

## 2021-05-13 DIAGNOSIS — I10 Essential (primary) hypertension: Secondary | ICD-10-CM | POA: Insufficient documentation

## 2021-05-13 DIAGNOSIS — R1032 Left lower quadrant pain: Secondary | ICD-10-CM | POA: Diagnosis not present

## 2021-05-13 DIAGNOSIS — K5900 Constipation, unspecified: Secondary | ICD-10-CM | POA: Insufficient documentation

## 2021-05-13 DIAGNOSIS — K3189 Other diseases of stomach and duodenum: Secondary | ICD-10-CM | POA: Diagnosis not present

## 2021-05-13 DIAGNOSIS — I700 Atherosclerosis of aorta: Secondary | ICD-10-CM | POA: Diagnosis not present

## 2021-05-13 LAB — COMPREHENSIVE METABOLIC PANEL
ALT: 6 U/L (ref 0–44)
AST: 13 U/L — ABNORMAL LOW (ref 15–41)
Albumin: 4.5 g/dL (ref 3.5–5.0)
Alkaline Phosphatase: 101 U/L (ref 38–126)
Anion gap: 11 (ref 5–15)
BUN: 9 mg/dL (ref 6–20)
CO2: 27 mmol/L (ref 22–32)
Calcium: 9.7 mg/dL (ref 8.9–10.3)
Chloride: 99 mmol/L (ref 98–111)
Creatinine, Ser: 0.91 mg/dL (ref 0.44–1.00)
GFR, Estimated: >60 mL/min (ref >60)
Glucose, Bld: 94 mg/dL (ref 70–99)
Potassium: 4 mmol/L (ref 3.5–5.1)
Sodium: 137 mmol/L (ref 135–145)
Total Bilirubin: 0.8 mg/dL (ref 0.3–1.2)
Total Protein: 8.4 g/dL — ABNORMAL HIGH (ref 6.5–8.1)

## 2021-05-13 LAB — URINALYSIS, ROUTINE W REFLEX MICROSCOPIC
Bilirubin Urine: NEGATIVE
Glucose, UA: NEGATIVE mg/dL
Hgb urine dipstick: NEGATIVE
Ketones, ur: NEGATIVE mg/dL
Leukocytes,Ua: NEGATIVE
Nitrite: NEGATIVE
Specific Gravity, Urine: 1.021 (ref 1.005–1.030)
pH: 6 (ref 5.0–8.0)

## 2021-05-13 LAB — CBC
HCT: 41.7 % (ref 36.0–46.0)
Hemoglobin: 13.8 g/dL (ref 12.0–15.0)
MCH: 27.7 pg (ref 26.0–34.0)
MCHC: 33.1 g/dL (ref 30.0–36.0)
MCV: 83.7 fL (ref 80.0–100.0)
Platelets: 309 10*3/uL (ref 150–400)
RBC: 4.98 MIL/uL (ref 3.87–5.11)
RDW: 14.6 % (ref 11.5–15.5)
WBC: 8.2 10*3/uL (ref 4.0–10.5)
nRBC: 0 % (ref 0.0–0.2)

## 2021-05-13 LAB — LIPASE, BLOOD: Lipase: 15 U/L (ref 11–51)

## 2021-05-13 LAB — PREGNANCY, URINE: Preg Test, Ur: NEGATIVE

## 2021-05-13 MED ORDER — HYDROMORPHONE HCL 1 MG/ML IJ SOLN
0.5000 mg | Freq: Once | INTRAMUSCULAR | Status: AC
  Administered 2021-05-13: 0.5 mg via INTRAVENOUS
  Filled 2021-05-13: qty 1

## 2021-05-13 MED ORDER — SODIUM CHLORIDE 0.9 % IV BOLUS
500.0000 mL | Freq: Once | INTRAVENOUS | Status: AC
  Administered 2021-05-13: 500 mL via INTRAVENOUS

## 2021-05-13 MED ORDER — ONDANSETRON HCL 4 MG/2ML IJ SOLN
4.0000 mg | Freq: Once | INTRAMUSCULAR | Status: AC
  Administered 2021-05-13: 4 mg via INTRAVENOUS
  Filled 2021-05-13: qty 2

## 2021-05-13 NOTE — ED Triage Notes (Signed)
Patient here POV from Home with ABD Pain. ? ?Patient endorses Pain to Mid/Left ABD for approximately 2 days. Pain has been worsening since yesterday. Sharp and Constant in [place] ? ?History of Renal Stones. Moderate Nausea. No Emesis. No New Constipation. No Diarrhea. No Known Fevers. ? ?NAD Noted during Triage. A&Ox4. GCS 15. Ambulatory. ?

## 2021-05-13 NOTE — Discharge Instructions (Addendum)
Return to ED with any new or worsening symptoms such as increased abdominal pain, nausea, vomiting, fever ?Please follow-up with the PCP for ongoing management ?Please purchase MiraLAX and attempt to take this.  Please also attempt to be active today in the event that this is gas pains ? ?

## 2021-05-13 NOTE — ED Provider Notes (Signed)
?[facility] ?Provider Note ? ? ?CSN: [id number] ?Arrival date & time: 05/13/21  1105 ? ?  ? ?History ? ?Chief Complaint  ?Patient presents with  ? Abdominal Pain  ? ? ?Lorraine Weber is a 41 y.o. female with medical history of hypertension, mastoiditis, migraines, von Willebrand disease, concussion, idiopathic intracranial hypertension with VP shunt in place.  Patient presents to ED for evaluation of left lower quadrant abdominal pain.  Patient states on Friday she began having left-sided flank pain which progressively wrapped around into her left lower quadrant.  Patient states that the pain is gotten progressively worse since Friday.  She describes it as a crampy, bloated, sharp and constant feeling.  Patient reports last bowel movement was on Thursday, she states that it is normal for her to have frequent bowel movements.  Patient denies taking any over-the-counter medications for pain relief.  Patient endorsing abdominal pain, nausea, constipation.  Patient denies any fevers, vomiting, diarrhea, lightheadedness or dizziness or weakness.  Patient recently had VP shunt adjusted and she is concerned about this. ? ? ?Abdominal Pain ?Associated symptoms: constipation and nausea   ?Associated symptoms: no diarrhea, no fever and no vomiting   ? ?  ? ?Home Medications ?Prior to Admission medications   ?Medication Sig Start Date End Date Taking? Authorizing Provider  ?amLODipine (NORVASC) 2.5 MG tablet Take 1 tablet (2.5 mg total) by mouth daily. 01/24/21 04/24/21  Croitoru, Mihai, MD  ?cyanocobalamin (,VITAMIN B-12,) 1000 MCG/ML injection Inject 1,000 mcg into the muscle every 30 (thirty) days. 09/12/20   [provider]  ?famotidine (PEPCID) 20 MG tablet Take 1 tablet (20 mg total) by mouth at bedtime. 09/18/20   Irene Shipper, MD  ?fluticasone Asencion Islam) 50 MCG/ACT nasal spray Place 1 spray into both nostrils daily.    [provider]  ?furosemide (LASIX) 40 MG tablet TAKE ONE  TABLET BY MOUTH DAILY 05/03/21   Croitoru, Mihai, MD  ?gabapentin (NEURONTIN) 300 MG capsule Take 300 mg by mouth 3 (three) times daily.    [provider]  ?losartan (COZAAR) 100 MG tablet TAKE ONE TABLET BY MOUTH ONCE DAILY 05/03/21   Croitoru, Dani Gobble, MD  ?methocarbamol (ROBAXIN) 750 MG tablet Take 750 mg by mouth every 6 (six) hours as needed for muscle spasms. 08/09/19   [provider]  ?metoprolol succinate (TOPROL-XL) 50 MG 24 hr tablet Take 1 tablet (50 mg total) by mouth daily. Take with or immediately following a meal. ?Patient taking differently: Take 50 mg by mouth at bedtime. Take with or immediately following a meal. 10/07/19   Croitoru, Mihai, MD  ?nortriptyline (PAMELOR) 50 MG capsule Take 50 mg by mouth 2 (two) times daily. 08/09/19   [provider]  ?omeprazole (PRILOSEC) 40 MG capsule Take 1 capsule (40 mg total) by mouth 2 (two) times daily. 09/18/20   Irene Shipper, MD  ?ondansetron (ZOFRAN) 4 MG tablet Take 4 mg by mouth every 8 (eight) hours as needed for vomiting or nausea. 12/29/18   [provider]  ?spironolactone (ALDACTONE) 25 MG tablet Take 25 mg by mouth at bedtime. 08/09/19   [provider]  ?tiZANidine (ZANAFLEX) 2 MG tablet Take 2 mg by mouth every 6 (six) hours as needed for muscle spasms. 12/24/19   [provider]  ?topiramate (TOPAMAX) 25 MG tablet Take 25 mg by mouth 3 (three) times daily. 01/31/20   [provider]  ?traMADol (ULTRAM) 50 MG tablet Take 50-100 mg by mouth 4 (four) times  daily as needed for moderate pain. 11/25/17   [provider]  ?triamcinolone ointment (KENALOG) 0.5 % Apply 1 application topically 2 (two) times daily. 09/25/20   Truman Hayward, MD  ?Vitamin D, Ergocalciferol, (DRISDOL) 1.25 MG (50000 UNIT) CAPS capsule Take 50,000 Units by mouth once a week. 08/30/20   [provider]  ?levonorgestrel-ethinyl estradiol (ALTAVERA) 0.15-30 MG-MCG per tablet Take 1 tablet by mouth  daily.    04/29/11  [provider]  ?   ? ?Allergies    ?Acetazolamide, Other, Levofloxacin, Levofloxacin in d5w, and Sulfamethoxazole-trimethoprim   ? ?Review of Systems   ?Review of Systems  ?Constitutional:  Negative for fever.  ?Gastrointestinal:  Positive for abdominal pain, constipation and nausea. Negative for diarrhea and vomiting.  ?Neurological:  Negative for dizziness, weakness and light-headedness.  ?All other systems reviewed and are negative. ? ?Physical Exam ?Updated Vital Signs ?BP 122/72   Pulse 87   Temp 98 ?F (36.7 ?C) (Oral)   Resp 16   Ht '5\' 4"'$  (1.626 m)   Wt 59.9 kg   SpO2 100%   BMI 22.67 kg/m?  ?Physical Exam ?Vitals and nursing note reviewed.  ?Constitutional:   ?   General: She is not in acute distress. ?   Appearance: She is well-developed. She is not ill-appearing, toxic-appearing or diaphoretic.  ?HENT:  ?   Head: Normocephalic and atraumatic.  ?   Nose: Nose normal. No congestion.  ?   Mouth/Throat:  ?   Mouth: Mucous membranes are moist.  ?   Pharynx: Oropharynx is clear.  ?Eyes:  ?   Extraocular Movements: Extraocular movements intact.  ?   Conjunctiva/sclera: Conjunctivae normal.  ?   Pupils: Pupils are equal, round, and reactive to light.  ?Cardiovascular:  ?   Rate and Rhythm: Normal rate and regular rhythm.  ?Pulmonary:  ?   Effort: Pulmonary effort is normal.  ?   Breath sounds: Normal breath sounds. No wheezing.  ?Abdominal:  ?   General: Abdomen is flat. Bowel sounds are normal. There is no distension.  ?   Palpations: Abdomen is rigid.  ?   Tenderness: There is abdominal tenderness in the suprapubic area and left lower quadrant. There is left CVA tenderness.  ?Musculoskeletal:  ?   Cervical back: Normal range of motion and neck supple. No tenderness.  ?Skin: ?   General: Skin is warm and dry.  ?   Capillary Refill: Capillary refill takes less than 2 seconds.  ?Neurological:  ?   Mental Status: She is alert and oriented to person, place, and time.  ? ? ?ED  Results / Procedures / Treatments   ?Labs ?(all labs ordered are listed, but only abnormal results are displayed) ?Labs Reviewed  ?COMPREHENSIVE METABOLIC PANEL - Abnormal; Notable for the following components:  ?    Result Value  ? Total Protein 8.4 (*)   ? AST 13 (*)   ? All other components within normal limits  ?URINALYSIS, ROUTINE W REFLEX MICROSCOPIC - Abnormal; Notable for the following components:  ? Protein, ur TRACE (*)   ? All other components within normal limits  ?LIPASE, BLOOD  ?CBC  ?PREGNANCY, URINE  ? ? ?EKG ?None ? ?Radiology ?CT Renal Stone Study ? ?Result Date: 05/13/2021 ?CLINICAL DATA:  Mid left abdominal and flank pain. EXAM: CT ABDOMEN AND PELVIS WITHOUT CONTRAST TECHNIQUE: Multidetector CT imaging of the abdomen and pelvis was performed following the standard protocol without IV contrast. RADIATION DOSE REDUCTION: This exam was  performed according to the departmental dose-optimization program which includes automated exposure control, adjustment of the mA and/or kV according to patient size and/or use of iterative reconstruction technique. COMPARISON:  08/02/2020 FINDINGS: Lower chest: Unremarkable. Hepatobiliary: No suspicious focal abnormality in the liver on this study without intravenous contrast. There is no evidence for gallstones, gallbladder wall thickening, or pericholecystic fluid. No intrahepatic or extrahepatic biliary dilation. Pancreas: No focal mass lesion. No dilatation of the main duct. No intraparenchymal cyst. No peripancreatic edema. Spleen: No splenomegaly. No focal mass lesion. Adrenals/Urinary Tract: No adrenal nodule or mass. Approximately 4-5 nonobstructing stones are seen in the right kidney measuring up to 1 of the more dominant 5 mm stones in the lower pole. Approximately 5 stones are seen in the left kidney including dominant 5 mm upper pole stone. No ureteral stones. No secondary changes in either kidney or ureter. No bladder stones Stomach/Bowel: Stomach is  nondistended. Duodenum is normally positioned as is the ligament of Treitz. No small bowel wall thickening. No small bowel dilatation. Terminal ileum not well seen. The appendix is not well visualized, but there is no ede

## 2021-05-20 DIAGNOSIS — R110 Nausea: Secondary | ICD-10-CM | POA: Diagnosis not present

## 2021-05-20 DIAGNOSIS — Z982 Presence of cerebrospinal fluid drainage device: Secondary | ICD-10-CM | POA: Diagnosis not present

## 2021-05-20 DIAGNOSIS — Z87891 Personal history of nicotine dependence: Secondary | ICD-10-CM | POA: Diagnosis not present

## 2021-05-20 DIAGNOSIS — Z4541 Encounter for adjustment and management of cerebrospinal fluid drainage device: Secondary | ICD-10-CM | POA: Diagnosis not present

## 2021-05-20 DIAGNOSIS — K668 Other specified disorders of peritoneum: Secondary | ICD-10-CM | POA: Diagnosis not present

## 2021-05-20 DIAGNOSIS — R197 Diarrhea, unspecified: Secondary | ICD-10-CM | POA: Diagnosis not present

## 2021-05-20 DIAGNOSIS — K521 Toxic gastroenteritis and colitis: Secondary | ICD-10-CM | POA: Diagnosis not present

## 2021-05-20 DIAGNOSIS — R1011 Right upper quadrant pain: Secondary | ICD-10-CM | POA: Diagnosis not present

## 2021-05-20 DIAGNOSIS — D6800 Von Willebrand disease, unspecified: Secondary | ICD-10-CM | POA: Diagnosis not present

## 2021-05-20 DIAGNOSIS — R1032 Left lower quadrant pain: Secondary | ICD-10-CM | POA: Diagnosis not present

## 2021-05-20 DIAGNOSIS — N200 Calculus of kidney: Secondary | ICD-10-CM | POA: Diagnosis not present

## 2021-05-20 DIAGNOSIS — R1084 Generalized abdominal pain: Secondary | ICD-10-CM | POA: Diagnosis not present

## 2021-05-20 DIAGNOSIS — G932 Benign intracranial hypertension: Secondary | ICD-10-CM | POA: Diagnosis not present

## 2021-05-20 DIAGNOSIS — T85730A Infection and inflammatory reaction due to ventricular intracranial (communicating) shunt, initial encounter: Secondary | ICD-10-CM | POA: Diagnosis not present

## 2021-05-20 DIAGNOSIS — T368X5A Adverse effect of other systemic antibiotics, initial encounter: Secondary | ICD-10-CM | POA: Diagnosis not present

## 2021-05-20 DIAGNOSIS — R109 Unspecified abdominal pain: Secondary | ICD-10-CM | POA: Diagnosis not present

## 2021-05-20 DIAGNOSIS — R932 Abnormal findings on diagnostic imaging of liver and biliary tract: Secondary | ICD-10-CM | POA: Diagnosis not present

## 2021-05-20 DIAGNOSIS — R188 Other ascites: Secondary | ICD-10-CM | POA: Diagnosis not present

## 2021-05-20 DIAGNOSIS — T85890A Other specified complication of nervous system prosthetic devices, implants and grafts, initial encounter: Secondary | ICD-10-CM | POA: Diagnosis not present

## 2021-05-22 DIAGNOSIS — Z982 Presence of cerebrospinal fluid drainage device: Secondary | ICD-10-CM | POA: Diagnosis not present

## 2021-06-06 ENCOUNTER — Ambulatory Visit: Payer: Medicare Other | Admitting: Physician Assistant

## 2021-06-06 DIAGNOSIS — R61 Generalized hyperhidrosis: Secondary | ICD-10-CM | POA: Diagnosis not present

## 2021-06-06 DIAGNOSIS — R1011 Right upper quadrant pain: Secondary | ICD-10-CM | POA: Diagnosis not present

## 2021-06-06 DIAGNOSIS — Z79899 Other long term (current) drug therapy: Secondary | ICD-10-CM | POA: Diagnosis not present

## 2021-06-06 DIAGNOSIS — B370 Candidal stomatitis: Secondary | ICD-10-CM | POA: Diagnosis not present

## 2021-06-06 DIAGNOSIS — Z982 Presence of cerebrospinal fluid drainage device: Secondary | ICD-10-CM | POA: Diagnosis not present

## 2021-06-06 DIAGNOSIS — R509 Fever, unspecified: Secondary | ICD-10-CM | POA: Diagnosis not present

## 2021-06-06 DIAGNOSIS — G932 Benign intracranial hypertension: Secondary | ICD-10-CM | POA: Diagnosis not present

## 2021-06-06 DIAGNOSIS — Z09 Encounter for follow-up examination after completed treatment for conditions other than malignant neoplasm: Secondary | ICD-10-CM | POA: Diagnosis not present

## 2021-06-06 DIAGNOSIS — R1031 Right lower quadrant pain: Secondary | ICD-10-CM | POA: Diagnosis not present

## 2021-06-06 DIAGNOSIS — R109 Unspecified abdominal pain: Secondary | ICD-10-CM | POA: Diagnosis not present

## 2021-06-06 DIAGNOSIS — R188 Other ascites: Secondary | ICD-10-CM | POA: Diagnosis not present

## 2021-06-06 DIAGNOSIS — R110 Nausea: Secondary | ICD-10-CM | POA: Diagnosis not present

## 2021-06-07 DIAGNOSIS — E063 Autoimmune thyroiditis: Secondary | ICD-10-CM | POA: Diagnosis not present

## 2021-06-07 DIAGNOSIS — T85730A Infection and inflammatory reaction due to ventricular intracranial (communicating) shunt, initial encounter: Secondary | ICD-10-CM | POA: Diagnosis not present

## 2021-06-07 DIAGNOSIS — I10 Essential (primary) hypertension: Secondary | ICD-10-CM | POA: Diagnosis not present

## 2021-06-07 DIAGNOSIS — K750 Abscess of liver: Secondary | ICD-10-CM | POA: Diagnosis not present

## 2021-06-10 DIAGNOSIS — J449 Chronic obstructive pulmonary disease, unspecified: Secondary | ICD-10-CM | POA: Diagnosis not present

## 2021-06-10 DIAGNOSIS — M159 Polyosteoarthritis, unspecified: Secondary | ICD-10-CM | POA: Diagnosis not present

## 2021-06-12 DIAGNOSIS — Z4802 Encounter for removal of sutures: Secondary | ICD-10-CM | POA: Diagnosis not present

## 2021-06-12 DIAGNOSIS — Z9889 Other specified postprocedural states: Secondary | ICD-10-CM | POA: Diagnosis not present

## 2021-06-12 DIAGNOSIS — R519 Headache, unspecified: Secondary | ICD-10-CM | POA: Diagnosis not present

## 2021-06-12 DIAGNOSIS — M2550 Pain in unspecified joint: Secondary | ICD-10-CM | POA: Diagnosis not present

## 2021-06-12 DIAGNOSIS — R109 Unspecified abdominal pain: Secondary | ICD-10-CM | POA: Diagnosis not present

## 2021-06-12 DIAGNOSIS — M1990 Unspecified osteoarthritis, unspecified site: Secondary | ICD-10-CM | POA: Diagnosis not present

## 2021-06-13 ENCOUNTER — Encounter: Payer: Self-pay | Admitting: Physician Assistant

## 2021-06-13 ENCOUNTER — Ambulatory Visit: Payer: Medicare Other | Admitting: Physician Assistant

## 2021-06-13 VITALS — BP 128/76 | HR 101 | Ht 64.0 in | Wt 134.8 lb

## 2021-06-13 DIAGNOSIS — K7689 Other specified diseases of liver: Secondary | ICD-10-CM

## 2021-06-13 DIAGNOSIS — K219 Gastro-esophageal reflux disease without esophagitis: Secondary | ICD-10-CM

## 2021-06-13 DIAGNOSIS — R1011 Right upper quadrant pain: Secondary | ICD-10-CM

## 2021-06-13 DIAGNOSIS — B370 Candidal stomatitis: Secondary | ICD-10-CM

## 2021-06-13 DIAGNOSIS — R1310 Dysphagia, unspecified: Secondary | ICD-10-CM | POA: Diagnosis not present

## 2021-06-13 DIAGNOSIS — R1013 Epigastric pain: Secondary | ICD-10-CM

## 2021-06-13 MED ORDER — FAMOTIDINE 40 MG PO TABS: 40.0000 mg | ORAL_TABLET | Freq: Two times a day (BID) | ORAL | 3 refills | Status: DC

## 2021-06-13 NOTE — Progress Notes (Signed)
Reviewed.  Complex case for sure. ?Nothing further to add at this point from our perspective..  Her neurosurgical team in conjunction with ID can manage her VP shunt and possible cyst related complication. ?

## 2021-06-13 NOTE — Progress Notes (Signed)
? ?Chief Complaint: Trouble swallowing and abdominal pain ? ?HPI: ?   Lorraine Weber is a 41 year old female with a past medical history as listed below including Erler's Danlos syndrome with vascular manifestations and clotting disorder on Plavix, known to Dr. Henrene Pastor, trouble swallowing and abdominal pain who was referred to me by Redmond School, MD for a complaint of trouble swallowing and abdominal pain.   ?   11/30/2019 patient saw Dr. Henrene Pastor for follow-up after endoscopy which was completed 09/30/2019 for dysphagia.  She was found to have whitish exudate throughout the esophagus and biopsies revealed esophagitis.  There is distal narrowing which was dilated with 52 [place] Maloney dilator.  She is continued on twice daily PPI and at night H2 receptor antagonist therapy.  Prescribed mostly control of her reflux symptoms but continues with intermittent dysphagia.  At times discussed she continue PPI and H2 blocker.  Was anticipated that she is going off Plavix at the end of the year and it was noted that if dysphagia persisted or worsen then could consider repeat dilation with larger dilator. ?   03/03/2020 patient saw cardiology and at that time described progressive dyspnea on exertion.  Also chest pain.  CTA was negative for PE earlier that month. ?   03/23/2020 echo with LVEF 65-70%.  She was noted to have mild to moderate aortic valve sclerosis.  No evidence of stenosis ?   04/12/2020 patient seen in clinic by me and discussed that the cardiologist wanted her to have an EGD with dilation prior to proceeding with TEE for evaluation of heart issues.  Schedule patient for EGD with dilation in the [facility].  She had already been holding her Plavix. ?   04/18/2020 EGD with esophageal dilation with a 38 [place] showed benign-appearing esophageal stenosis, normal stomach with small hiatal hernia and normal examined duodenum. ?   05/13/2021 CT renal stone study showed bilateral nonobstructing renal stones and aortic atherosclerosis. ?    05/20/2021 patient had a CT the abdomen pelvis with contrast which showed multiple nonobstructing renal calculi and small well-circumscribed intra-abdominal fluid collection along the tip of the right lobe of the liver which favored pseudocyst from prior VP shunt although no comparison imaging was available to confirm. ?   05/23/2021-05/28/2021 patient admitted to the hospital as she was status post V VP shunt with abdominal pain nausea and diarrhea.  She had a lot of abdominal pain which was thought likely secondary to abdominal pseudocyst.  Please see that admission note for/12/23 for further details. ?  4 /15/23 abdominal ultrasound with nonobstructing right nephrolithiasis and previously described fluid collection inferior to the right hepatic lobe was not visualized. ?   06/06/2021 patient seen in follow-up by infectious disease.  Please see their note for details.  She continued with nausea, right upper quadrant and right lower quadrant abdominal pain and night sweats.  Also had oral thrush and was started on oral Fluconazole q. 4 days x 3 doses with mild relief.  She was then prescribed nystatin swish and swallow. ?   Today, the patient presents to clinic accompanied by her husband who assists with history and tells me that initially the appointment was made because she was having trouble swallowing a few months back but then she had all of the other acute events occur as above with a cyst near her liver and pain in that region.  As far as swallowing patient has been treated with Fluconazole as well as Nystatin swish and swallow for oral  thrush and she feels like this helped her swallowing a little bit, but still continues with troubles with certain pills and meats etc.  Tells me it feels very similar to right before she had her last dilation which did help her a lot with the symptoms. ?   They also discussed today they are following with an infectious disease in regards to treatment for this fluid collection  which they think may be infectious.  Apparently the antibiotics have led to a lot of side effects including an increase in stools up to 4 times a day and patient tells me she has been noticing some swelling in her hands and feet now which she also feels like may be attributed to the antibiotics.  Apparently she has a phone call follow-up with them tomorrow. ?   They do ask if there are any other recommendations for this fluid collection just underneath her liver.  She does continue with pain in this area and if she takes a deep breath or sits the wrong way it is worsened.  Overall she has just been trying to sit very still so that it does not hurt.  Initially when she was in the hospital they did draw some fluid out of this and it helped a little bit, but it seems to be worsening slowly again. ?   Also complains of an increase in heartburn and reflux since all of this started regardless of her Omeprazole 40 mg twice a day and Famotidine 20 mg at night. ?   Denies nausea or vomiting, no blood in her stool. ? ?Past Medical History:  ?Diagnosis Date  ? Anemia   ? Aortic valve endocarditis 09/25/2020  ? Clotting disorder ([place])   ? Von Willebrand   ? Concussion   ? x8, last 2010, residual homonymous Hemianopsia  ? CSF leak   ? multiple serum pathes  ? Frequent sinus infections   ? tx with flonase nasal spray  ? GERD (gastroesophageal reflux disease)   ? History of blood transfusion   ? over 5 yrs ago  ? Hypertension   ? 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 Macular rash 09/25/2020  ? Mastoiditis   ? Migraines   ? Nephrolithiasis   ? X5, Dr Jeffie Pollock, passed stones, no surgery  ? Peptic ulcer disease   ? Septic joint of left wrist ([place]) 09/25/2020  ? Von Willebrand disease ([place])   ? mild form per patient  ? ? ?Past Surgical History:  ?Procedure Laterality Date  ? APPENDECTOMY    ? cerebral stent Bilateral   ? jugular vein  ? CERVICAL FUSION  2018  ? C5-C7  ? DILITATION & CURRETTAGE/HYSTROSCOPY WITH NOVASURE ABLATION N/A  08/23/2015  ? Procedure: DILATATION & CURETTAGE/HYSTEROSCOPY WITH NOVASURE ABLATION;  Surgeon: Vanessa Kick, MD;  Location: [facility];  Service: Gynecology;  Laterality: N/A;  ? EXTRACORPOREAL SHOCK WAVE LITHOTRIPSY Left 03/19/2018  ? Procedure: EXTRACORPOREAL SHOCK WAVE LITHOTRIPSY (ESWL);  Surgeon: Bjorn Loser, MD;  Location: [facility];  Service: Urology;  Laterality: Left;  ? HAND TENDON SURGERY Left   ? left, complicated by MRSA  ? LAMINECTOMY  21016  ? T10-T12  ? spinal fluid leak patching    ? > 10X;[facility] - blood patching  ? SPINAL FUSION  2017  ? T10-T11  ? SPINAL FUSION  2018  ? T10-L1  ? TEE WITHOUT CARDIOVERSION N/A 05/17/2020  ? Procedure: TRANSESOPHAGEAL ECHOCARDIOGRAM (TEE);  Surgeon: Sanda Klein, MD;  Location: [facility];  Service: Cardiovascular;  Laterality: N/A;  ? WISDOM TOOTH EXTRACTION    ? ? ?Current Outpatient Medications  ?Medication Sig Dispense Refill  ? amLODipine (NORVASC) 2.5 MG tablet Take 1 tablet (2.5 mg total) by mouth daily. 90 tablet 3  ? cyanocobalamin (,VITAMIN B-12,) 1000 MCG/ML injection Inject 1,000 mcg into the muscle every 30 (thirty) days.    ? famotidine (PEPCID) 20 MG tablet Take 1 tablet (20 mg total) by mouth at bedtime. 90 tablet 3  ? fluticasone (FLONASE) 50 MCG/ACT nasal spray Place 1 spray into both nostrils daily.    ? furosemide (LASIX) 40 MG tablet TAKE ONE TABLET BY MOUTH DAILY 90 tablet 0  ? gabapentin (NEURONTIN) 300 MG capsule Take 300 mg by mouth 3 (three) times daily.    ? losartan (COZAAR) 100 MG tablet TAKE ONE TABLET BY MOUTH ONCE DAILY 90 tablet 0  ? methocarbamol (ROBAXIN) 750 MG tablet Take 750 mg by mouth every 6 (six) hours as needed for muscle spasms.    ? metoprolol succinate (TOPROL-XL) 50 MG 24 hr tablet Take 1 tablet (50 mg total) by mouth daily. Take with or immediately following a meal. (Patient taking differently: Take 50 mg by mouth at bedtime. Take with or immediately following a meal.) 90 tablet 3  ? nortriptyline (PAMELOR) 50 MG capsule  Take 50 mg by mouth 2 (two) times daily.    ? omeprazole (PRILOSEC) 40 MG capsule Take 1 capsule (40 mg total) by mouth 2 (two) times daily. 180 capsule 3  ? ondansetron (ZOFRAN) 4 MG tablet Take 4 mg by

## 2021-06-13 NOTE — Patient Instructions (Signed)
If you are age 43 or older, your body mass index should be between 23-30. Your Body mass index is 23.14 kg/m?Marland Kitchen If this is out of the aforementioned range listed, please consider follow up with your Primary Care Provider. ? ?If you are age 58 or younger, your body mass index should be between 19-25. Your Body mass index is 23.14 kg/m?Marland Kitchen If this is out of the aformentioned range listed, please consider follow up with your Primary Care Provider.  ? ?We have sent the following medications to your pharmacy for you to pick up at your convenience:Famotidine 40 mg twice daily once in the morning and once at night time.  ? ?The [facility] providers would like to encourage you to use [facility] to communicate with providers for non-urgent requests or questions.  Due to long hold times on the telephone, sending your provider a message by [facility] may be a faster and more efficient way to get a response.  Please allow 48 business hours for a response.  Please remember that this is for non-urgent requests.  ? ?It was a pleasure to see you today! ? ?Thank you for trusting me with your gastrointestinal care!   ? ?Ellouise Newer, PA-C ? ?

## 2021-06-14 DIAGNOSIS — R1011 Right upper quadrant pain: Secondary | ICD-10-CM | POA: Diagnosis not present

## 2021-06-14 DIAGNOSIS — R1031 Right lower quadrant pain: Secondary | ICD-10-CM | POA: Diagnosis not present

## 2021-06-14 DIAGNOSIS — R112 Nausea with vomiting, unspecified: Secondary | ICD-10-CM | POA: Diagnosis not present

## 2021-06-14 DIAGNOSIS — Z79899 Other long term (current) drug therapy: Secondary | ICD-10-CM | POA: Diagnosis not present

## 2021-06-14 DIAGNOSIS — B370 Candidal stomatitis: Secondary | ICD-10-CM | POA: Diagnosis not present

## 2021-06-14 DIAGNOSIS — R000 Tachycardia, unspecified: Secondary | ICD-10-CM | POA: Diagnosis not present

## 2021-06-14 DIAGNOSIS — R509 Fever, unspecified: Secondary | ICD-10-CM | POA: Diagnosis not present

## 2021-06-14 DIAGNOSIS — Z792 Long term (current) use of antibiotics: Secondary | ICD-10-CM | POA: Diagnosis not present

## 2021-06-14 DIAGNOSIS — R109 Unspecified abdominal pain: Secondary | ICD-10-CM | POA: Diagnosis not present

## 2021-06-14 DIAGNOSIS — R110 Nausea: Secondary | ICD-10-CM | POA: Diagnosis not present

## 2021-06-14 DIAGNOSIS — R188 Other ascites: Secondary | ICD-10-CM | POA: Diagnosis not present

## 2021-06-14 DIAGNOSIS — R61 Generalized hyperhidrosis: Secondary | ICD-10-CM | POA: Diagnosis not present

## 2021-06-19 DIAGNOSIS — K117 Disturbances of salivary secretion: Secondary | ICD-10-CM | POA: Diagnosis not present

## 2021-06-19 DIAGNOSIS — I7300 Raynaud's syndrome without gangrene: Secondary | ICD-10-CM | POA: Diagnosis not present

## 2021-06-19 DIAGNOSIS — Z8261 Family history of arthritis: Secondary | ICD-10-CM | POA: Diagnosis not present

## 2021-06-19 DIAGNOSIS — R519 Headache, unspecified: Secondary | ICD-10-CM | POA: Diagnosis not present

## 2021-06-19 DIAGNOSIS — E507 Other ocular manifestations of vitamin A deficiency: Secondary | ICD-10-CM | POA: Diagnosis not present

## 2021-06-19 DIAGNOSIS — Z8679 Personal history of other diseases of the circulatory system: Secondary | ICD-10-CM | POA: Diagnosis not present

## 2021-06-19 DIAGNOSIS — R202 Paresthesia of skin: Secondary | ICD-10-CM | POA: Diagnosis not present

## 2021-06-19 DIAGNOSIS — M130 Polyarthritis, unspecified: Secondary | ICD-10-CM | POA: Diagnosis not present

## 2021-06-19 DIAGNOSIS — R768 Other specified abnormal immunological findings in serum: Secondary | ICD-10-CM | POA: Diagnosis not present

## 2021-06-19 DIAGNOSIS — Z982 Presence of cerebrospinal fluid drainage device: Secondary | ICD-10-CM | POA: Diagnosis not present

## 2021-06-19 DIAGNOSIS — Z87891 Personal history of nicotine dependence: Secondary | ICD-10-CM | POA: Diagnosis not present

## 2021-06-19 DIAGNOSIS — Q796 Ehlers-Danlos syndrome, unspecified: Secondary | ICD-10-CM | POA: Diagnosis not present

## 2021-06-19 DIAGNOSIS — R760 Raised antibody titer: Secondary | ICD-10-CM | POA: Diagnosis not present

## 2021-06-19 DIAGNOSIS — R21 Rash and other nonspecific skin eruption: Secondary | ICD-10-CM | POA: Diagnosis not present

## 2021-06-19 DIAGNOSIS — Z79899 Other long term (current) drug therapy: Secondary | ICD-10-CM | POA: Diagnosis not present

## 2021-06-19 DIAGNOSIS — G932 Benign intracranial hypertension: Secondary | ICD-10-CM | POA: Diagnosis not present

## 2021-06-19 DIAGNOSIS — G8929 Other chronic pain: Secondary | ICD-10-CM | POA: Diagnosis not present

## 2021-06-19 DIAGNOSIS — I358 Other nonrheumatic aortic valve disorders: Secondary | ICD-10-CM | POA: Diagnosis not present

## 2021-06-28 ENCOUNTER — Encounter: Payer: Self-pay | Admitting: Cardiovascular Disease

## 2021-06-28 ENCOUNTER — Ambulatory Visit: Payer: Medicare Other | Admitting: Cardiovascular Disease

## 2021-06-28 VITALS — BP 126/82 | HR 88 | Ht 64.0 in | Wt 138.0 lb

## 2021-06-28 DIAGNOSIS — I5032 Chronic diastolic (congestive) heart failure: Secondary | ICD-10-CM

## 2021-06-28 DIAGNOSIS — R072 Precordial pain: Secondary | ICD-10-CM

## 2021-06-28 DIAGNOSIS — Q796 Ehlers-Danlos syndrome, unspecified: Secondary | ICD-10-CM | POA: Diagnosis not present

## 2021-06-28 DIAGNOSIS — I7300 Raynaud's syndrome without gangrene: Secondary | ICD-10-CM

## 2021-06-28 DIAGNOSIS — I10 Essential (primary) hypertension: Secondary | ICD-10-CM | POA: Diagnosis not present

## 2021-06-28 DIAGNOSIS — T82817D Embolism of cardiac prosthetic devices, implants and grafts, subsequent encounter: Secondary | ICD-10-CM | POA: Diagnosis not present

## 2021-06-28 DIAGNOSIS — I871 Compression of vein: Secondary | ICD-10-CM

## 2021-06-28 DIAGNOSIS — R768 Other specified abnormal immunological findings in serum: Secondary | ICD-10-CM | POA: Diagnosis not present

## 2021-06-28 DIAGNOSIS — I700 Atherosclerosis of aorta: Secondary | ICD-10-CM | POA: Diagnosis not present

## 2021-06-28 DIAGNOSIS — I351 Nonrheumatic aortic (valve) insufficiency: Secondary | ICD-10-CM

## 2021-06-28 NOTE — Progress Notes (Signed)
Cardiology Office Note   Date:  06/28/2021   ID:  Lorraine Weber, DOB 09/21/1980, MRN [id number]  PCP:  Redmond School, MD  Cardiologist:  Sanda Klein, MD EP: None  Chief Complaint  Patient presents with   Cardiac Valve Problem   History of Present Illness: Lorraine Weber is a 41 y.o. female with aortic insufficiency due to leaflet abnormalities, presumed Ehlers-Danlos syndrome with vascular manifestations (genetic work-up negative for common mutations), Von Willebrands disease, idiopathic peripheral neuropathy, who presents for follow-up of aortic insufficiency.  She has not had cardiac problems but had a bout of issues related to intracranial hypertension.  She underwent placement of a ventriculoperitoneal shunt in November.  This had a valve failure and she had to undergo shunt revision in March 2023.  A month later she developed infection and the shunt had to be removed.  She is suffering from headaches because of the shunt removal now.  She did not have bacteremia and she has not undergone repeat echocardiography around this event.   She noticed worsening of her joint inflammation during the acute illness.  She has developed some worsening dysphagia and thinks she may need a repeat esophageal dilation, but is waiting for full recovery from her issues with her shunt.  She has constant mild discomfort to the left of her sternum that is not associated with activity, position or other obvious precipitating factors.  It is "always there".  She denies palpitations, dizziness or syncope.  She does not have orthopnea, PND or lower extremity edema.  She has reduced exercise tolerance which she attributes to deconditioning.  Transesophageal echocardiography in April showed no abnormalities of the aortic root, which showed unusual changes of the aortic valve cusps, suggestive of healed or noninfective endocarditis.  The appearance was vaguely suggestive of Libman-Sacks endocarditis so we  sent off screening tests for autoimmune disease.  ANA was positive with a striking increase in SSA (Ro) antibodies, but the ESR was only 1 suggesting that there is no active ongoing inflammation.  Anticardiolipin antibodies were negative.  Hgb and platelet count normal  She had another transthoracic echocardiogram at [facility] on 07/03/2020 which described her aortic insufficiency as moderate.  And another CT angiogram of the aorta on 07/10/2020.  This did not change the diagnostic findings.  Finally she had a 3-day event monitor that showed rare PVCs and PACs with 2 episodes of nonsustained atrial tachycardia with a maximum of 16 beats.  Emergency work-up in January 2022 showed normal cardiac troponins, normal chest x-ray, no evidence of pulmonary embolism on CT angiography.  There was also no evidence of aortic aneurysm or dissection. She was noted to have age advanced atheromatous plaque of the aorta and proximal left subclavian on CT scan 02/18/2020.  The same surprising finding was noted on her TEE.  She does have a history of severe illness with Staph aureus bacteremia in 2006.  She had a PICC line and received antibiotics intravenously for several weeks.  She underwent left internal jugular vein stenting 10/2019 for management of stenosis causing elevated intracerebral pressure (bilateral stenosis in turn occurring close to the level of previous cervical spine surgery). Shortly thereafter she was found to have embolization of a Celt closure device to the right ventricle. She underwent a Coronary CTA which showed no calcifications in the coronary arteries and a metallic device in the right ventricle, trapped by the trabeculation of the right ventricle. No further intervention or work-up was recommended at that time. She  subsequently underwent right internal jugular vein stenting 01/2020.  Before undergoing her TEE, she first had to undergo esophagoplasty due to dysphagia and esophageal stenosis (04/18/2020).     Negative COL5A1, COL5A2, and COL1A1, autosomal dominant disorders associated with the classical Ehlers Danlos syndrome types 1 and 2. 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   Macular rash 09/25/2020   Mastoiditis    Migraines    Nephrolithiasis    X5, Dr Jeffie Pollock, passed stones, no surgery   Peptic ulcer disease    Septic joint of left wrist ([place]) 09/25/2020   Von Willebrand disease ([place])    mild form per patient    Past Surgical History:  Procedure Laterality Date   APPENDECTOMY     cerebral stent Bilateral    jugular vein   CERVICAL FUSION  2018   C5-C7   DILITATION & CURRETTAGE/HYSTROSCOPY WITH NOVASURE ABLATION N/A 08/23/2015   Procedure: DILATATION & CURETTAGE/HYSTEROSCOPY WITH NOVASURE ABLATION;  Surgeon: Vanessa Kick, MD;  Location: [facility];  Service: Gynecology;  Laterality: N/A;   EXTRACORPOREAL SHOCK WAVE LITHOTRIPSY Left 03/19/2018   Procedure: EXTRACORPOREAL SHOCK WAVE LITHOTRIPSY (ESWL);  Surgeon: Bjorn Loser, MD;  Location: [facility];  Service: Urology;  Laterality: Left;   HAND TENDON SURGERY Left    left, complicated by MRSA   LAMINECTOMY  21016   T10-T12   SHUNT REMOVAL  05/2021   spinal fluid leak patching     > 10X;[facility] - blood patching   SPINAL FUSION  2017   T10-T11   SPINAL FUSION  2018   T10-L1   TEE WITHOUT CARDIOVERSION N/A 05/17/2020   Procedure: TRANSESOPHAGEAL ECHOCARDIOGRAM (TEE);  Surgeon: Sanda Klein,  MD;  Location: [facility];  Service: Cardiovascular;  Laterality: N/A;   VENTRICULOPERITONEAL SHUNT  12/2020   WISDOM TOOTH EXTRACTION       Current Outpatient Medications  Medication Sig Dispense Refill   amLODipine (NORVASC) 2.5 MG tablet Take 1 tablet (2.5 mg total) by mouth daily. 90 tablet 3   cyanocobalamin (,VITAMIN B-12,) 1000 MCG/ML injection Inject 1,000 mcg into the muscle every 30 (thirty) days.     famotidine (PEPCID) 40 MG tablet Take 1 tablet (40 mg total) by mouth 2 (two) times daily. 60 tablet 3   fluticasone (FLONASE) 50 MCG/ACT nasal spray Place 1 spray into both nostrils daily.     furosemide (LASIX) 40 MG tablet TAKE ONE TABLET BY MOUTH DAILY 90 tablet 0   gabapentin (NEURONTIN) 300 MG capsule Take 300 mg by mouth 3 (three) times daily.     losartan (COZAAR) 100 MG tablet TAKE ONE TABLET BY MOUTH ONCE DAILY 90 tablet 0   methocarbamol (ROBAXIN) 750 MG tablet Take 750 mg by mouth every 6 (six) hours as needed for muscle spasms.     metoprolol succinate (TOPROL-XL) 50 MG 24 hr tablet Take 1 tablet (50 mg total) by  mouth daily. Take with or immediately following a meal. (Patient taking differently: Take 50 mg by mouth at bedtime. Take with or immediately following a meal.) 90 tablet 3   nortriptyline (PAMELOR) 50 MG capsule Take 50 mg by mouth 2 (two) times daily.     omeprazole (PRILOSEC) 40 MG capsule Take 1 capsule (40 mg total) by mouth 2 (two) times daily. 180 capsule 3   ondansetron (ZOFRAN) 4 MG tablet Take 4 mg by mouth every 8 (eight) hours as needed for vomiting or nausea.     tiZANidine (ZANAFLEX) 2 MG tablet Take 2 mg by mouth every 6 (six) hours as needed for muscle spasms.     topiramate (TOPAMAX) 25 MG tablet Take 25 mg by mouth 3 (three) times daily.     traMADol (ULTRAM) 50 MG tablet Take 50-100 mg by mouth 4 (four) times daily as needed for moderate pain.  2   Vitamin D, Ergocalciferol, (DRISDOL) 1.25 MG (50000 UNIT) CAPS capsule Take 50,000 Units by mouth  once a week.     No current facility-administered medications for this visit.    Allergies:   Acetazolamide, Other, Vancomycin, Levofloxacin, Levofloxacin in d5w, and Sulfamethoxazole-trimethoprim    Social History:  The patient  reports that she has quit smoking. Her smoking use included cigarettes. She has a 3.00 pack-year smoking history. She has never used smokeless tobacco. She reports that she does not drink alcohol and does not use drugs.   Family History:  The patient's family history includes Arthritis in her father; Diabetes in her father, maternal grandmother, and paternal grandfather; Heart disease in her brother, maternal aunt, maternal grandfather, maternal grandmother, maternal uncle, and mother; Hyperlipidemia in her father and paternal grandfather; Kidney disease in her father; Other in an other family member; Prostate cancer in her paternal grandfather; Stroke in her father; Stroke (age of onset: 82) in her maternal aunt.    ROS:  Please see the history of present illness.   Otherwise, review of systems are positive for none.   All other systems are reviewed and negative.    PHYSICAL EXAM: VS:  BP 126/82   Pulse 88   Ht '5\' 4"'  (1.626 m)   Wt 138 lb (62.6 kg)   SpO2 100%   BMI 23.69 kg/m  , BMI Body mass index is 23.69 kg/m.   General: Alert, oriented x3, no distress, slender Head: no evidence of trauma, PERRL, EOMI, no exophtalmos or lid lag, no myxedema, no xanthelasma; normal ears, nose and oropharynx Neck: normal jugular venous pulsations and no hepatojugular reflux; brisk carotid pulses without delay and no carotid bruits Chest: clear to auscultation, no signs of consolidation by percussion or palpation, normal fremitus, symmetrical and full respiratory excursions Cardiovascular: normal position and quality of the apical impulse, regular rhythm, normal first and second heart sounds, 2/6 decrescendo aortic murmur heard up and down the right sternal border, faint  accompanying systolic ejection murmur, no apical murmurs, rubs or gallops Abdomen: no tenderness or distention, no masses by palpation, no abnormal pulsatility or arterial bruits, normal bowel sounds, no hepatosplenomegaly Extremities: no clubbing, cyanosis or edema; 2+ radial, ulnar and brachial pulses bilaterally; 2+ right femoral, posterior tibial and dorsalis pedis pulses; 2+ left femoral, posterior tibial and dorsalis pedis pulses; no subclavian or femoral bruits Neurological: grossly nonfocal Psych: Normal mood and affect    EKG:  EKG is ordered today.  It shows sinus tachycardia and possible left atrial abnormality, but is otherwise a normal tracing.  Recent  Labs: 05/13/2021: ALT 6; BUN 9; Creatinine, Ser 0.91; Hemoglobin 13.8; Platelets 309; Potassium 4.0; Sodium 137    Lipid Panel    Component Value Date/Time   CHOL 128 06/28/2016 1531   TRIG 98 06/28/2016 1531   HDL 48 06/28/2016 1531   LDLCALC 60 06/28/2016 1531      Wt Readings from Last 3 Encounters:  06/28/21 138 lb (62.6 kg)  06/13/21 134 lb 12.8 oz (61.1 kg)  05/13/21 132 lb 0.9 oz (59.9 kg)      Other studies Reviewed: Additional studies/ records that were reviewed today include:  [facility] CTA Aorta 07/10/2020 1. No evidence of acute aortic syndrome or aneurysm. There are no findings to suggest aortic thrombus.   2. Tricuspid aortic valve with leaflet thickening but no appreciable calcifications.   3. Approximately 50% stenosis of the proximal left subclavian artery by mixed plaque.   4. ACDF and T11-L1 lumbar fixation hardware are intact.  [facility] echocardiogram 07/03/2020  NORMAL LEFT VENTRICULAR SYSTOLIC FUNCTION    NORMAL LA PRESSURES WITH NORMAL DIASTOLIC FUNCTION    NORMAL RIGHT VENTRICULAR SYSTOLIC FUNCTION    VALVULAR REGURGITATION: TRIVIAL AR, MILD MR, MILD TR    NO VALVULAR STENOSIS    TACHYCARDIA THROUGHOUT IMAGING    NO VALVULAR VEGETATION NOT SEEN ON TODAY'S EXAM.    TRIVIAL AORTIC REGURGITATION  NOTED ON TODAY'S EXAM WITH PHT = 653 ms.    NO PRIOR STUDY FOR COMPARISON  [facility] 3-day event monitor 09/24/2020 11:53 AM EDT    *The observed rhythms are sinus rhythm to sinus tachycardia. *The Maximum Heart Rate recorded was 146 bpm, Day 4 / 12:56:35 pm, the Minimum Heart Rate recorded was 64 bpm, Day 3 / 06:16:14 am and the Average Heart Rate was 94 bpm. *There were 13 PVCs  with a burden of < 0.01 %. *There were 20 PSVCs with a burden of < 0.01 %. There were 2 occurrences of Supraventricular Tachycardia with the longest episode 16 beats, Day 4 / 03:28:41 am and the fastest episode 123 bpm, Day 2 / 04:38:10 am. *There were  22 Patient reported events.  Echocardiogram 03/23/2020  1. Left ventricular ejection fraction, by estimation, is 65 to 70%. Left  ventricular ejection fraction by 3D volume is 68 %. The left ventricle has  normal function. The left ventricle has no regional wall motion  abnormalities. Left ventricular diastolic   parameters are consistent with Grade I diastolic dysfunction (impaired  relaxation). The average left ventricular global longitudinal strain is  -24.1 %. The global longitudinal strain is normal.   2. Right ventricular systolic function is normal. The right ventricular  size is normal. There is normal pulmonary artery systolic pressure. The  estimated right ventricular systolic pressure is 78.6 mmHg.   3. The mitral valve is normal in structure. Trivial mitral valve  regurgitation. No evidence of mitral stenosis.   4. The aortic valve is calcified. There is moderate calcification of the  aortic valve. There is moderate thickening of the aortic valve. Aortic  valve regurgitation is moderate to severe. Aortic regurgitation PHT ranges  from 399to 270 msec. Mild to  moderate aortic valve sclerosis/calcification is present, without any  evidence of aortic stenosis.   5. The inferior vena cava is normal in size with greater than 50%  respiratory variability,  suggesting right atrial pressure of 3 mmHg.   6. The patient has a hx of Ehler-Danlos syndrome with normal aortic root  and ascending aorta but the aortic arch and descending  aorta appear  generous. Consider dedicated gated Chest CTA for further assessment.   7. Compared to prior echo, AI is now moderate to severe. Consider TEE for  further assessment.  Coronary CTA 01/2020: 1. No calcifications seen in the coronary arteries.  2. There is a metallic device in the right ventricle, trapped by the trabeculation of the right ventricle. Clinical correlation advised. Ordering physician notified.  TEE 05/18/2018   1. Left ventricular ejection fraction, by estimation, is 60 to 65%. The  left ventricle has normal function. Left ventricular diastolic parameters  were normal.   2. Right ventricular systolic function is normal. The right ventricular  size is normal. There is normal pulmonary artery systolic pressure. The  estimated right ventricular systolic pressure is 52.7 mmHg.   3. Left atrial size was mild to moderately dilated. No left atrial/left  atrial appendage thrombus was detected.   4. The mitral valve is myxomatous. Trivial mitral valve regurgitation.   5. The tricuspid valve is myxomatous.   6. There are fixed sessile, verrucous appearing echodensities on the  margins of all three aortic valve leaflets, particularly prominent on the  right and left cusps. They impair leaflet coaptation allowing for a  central regurgitant orifice.. The aortic  valve is tricuspid. There is moderate thickening of the aortic valve.  Aortic valve regurgitation is moderate to severe. No aortic stenosis is  present. Aortic regurgitation PHT measures 296 msec.   7. There is mild (Grade II) layered plaque involving the descending  aorta.   Conclusion(s)/Recommendation(s): The cause of the aortic valve changes is  unclear. Consider Libman-Sacks endocarditis or late changes of healed  bacterial  endocarditis.   LEFT VENTRICLE  LVIDd:         4.19 cm  LVIDs:         2.89 cm      AORTIC VALVE  AI PHT:            296 msec  AR Vena Contracta: 0.43 cm     AORTA  Ao Root diam: 2.83 cm  Ao STJ diam:  2.7 cm  Ao Asc diam:  2.90 cm  Ao Arch diam: 1.9 cm  Ao Desc diam: 2.00 cm   ASSESSMENT AND PLAN:  1. Chronic diastolic heart failure ([place])   2. Nonrheumatic aortic valve insufficiency   3. Precordial pain   4. Embolism involving cardiac device, subsequent encounter   5. Essential hypertension   6. Jugular vein stenosis   7. EDS (Ehlers-Danlos syndrome)   8. Raynaud's phenomenon without gangrene   9. Antinuclear factor positive   10. Aortic atherosclerosis ([place])        1. CHF: Presumptive diagnosis based on response to treatment, but she has not undergone right heart catheterization and her BNP has been normal.  No sign of hypervolemia, NYHA class I.  Avoid excessive doses of beta-blocker since bradycardia could be disadvantageous with aortic insufficiency.  2. AI: mechanism appears to be postinflammatory (sequela of endocarditis that occurred 15 years ago versus Libman-Sacks). Recheck yearly echo. Not meeting criteria for replacement at this time.  3. Chest pain: atypical, constant, not worsened by exertion. No evidence of pulmonary embolism or aortic dissection on  recent perform CT and TEE.   Cor[facility] 01/2020 [facility] (to further evaluate device embolism to RV) was without coronary artery calcifications.   4. 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 No evidence of migration on CT  chest 02/18/20.  Unlikely that any of her symptoms are due to this and suspect that by this point the device may be fully endothelialized.  5. HTN: well controlled.  6. 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 Followed by neurosurgery outpatient. S/P removal of VP shunt due to  infection.  7. Ehlers-Danlos Syndrome: reportedly mother has EDS with vascular component as well ; genetic testing has been negative for common variants  8. Raynaud's sd: currently quiescent  10. Positive ANA/anti-Ro: seeing Dr. Genella Mech. ANA 1:320, homogeneous. Smith, RNP, dsDNA, RF and anti CCP negative. Normal recent complement levels. Negative anticardiolipin antibodies.  She tells me that the possible diagnosis is mixed connective tissue disease.   11. Aortic atherosclerosis: surprisingly advanced for age, but normal caliber of the aorta and no clinical PAD. Check lipid panel.     Current medicines are reviewed at length with the patient today.  The patient does not have concerns regarding medicines.   Labs/ tests ordered today include:   Orders Placed This Encounter  Procedures   Lipid panel   EKG 12-Lead   ECHOCARDIOGRAM COMPLETE    Patient Instructions  Medication Instructions:  No changes *If you need a refill on your cardiac medications before your next appointment, please call your pharmacy*   Lab Work: leftYour provider would like for you to return in a few weeks to have the following labs drawn: Fasting Lipid. You do not need an appointment for the lab. Once in our office lobby there is a podium where you can sign in and ring the doorbell to alert [place] that you are here. The lab is open from 8:00 am to 4 pm; closed for lunch from 12:45pm-1:45pm.  You may also go to any of these [facility] locations:   [place] ([place]) - [address] [place] [address] [employer] [address]   If you have labs (blood work) drawn today and your tests are completely normal, you will receive your results only by: [employer] (if you have MyChart) OR A paper copy in the mail If you have any lab test that is abnormal or we need to change your treatment, we will call you to review the results.   Testing/Procedures: Your  physician has requested that you have an echocardiogram. Echocardiography is a painless test that uses sound waves to create images of your heart. It provides your doctor with information about the size and shape of your heart and how well your heart's chambers and valves are working. You may receive an ultrasound enhancing agent through an IV if needed to better visualize your heart during the echo.This procedure takes approximately one hour. There are no restrictions for this procedure. This will take place at the [address], [address].    Follow-Up: At [facility], you and your health needs are our priority.  As part of our continuing mission to provide you with exceptional heart care, we have created designated Provider Care Teams.  These Care Teams include your primary Cardiologist (physician) and Advanced Practice Providers (APPs -  Physician Assistants and Nurse Practitioners) who all work together to provide you with the care you need, when you need it.  We recommend signing up for the patient portal called "MyChart".  Sign up information is provided on this After Visit Summary.  MyChart is used to connect with patients for Virtual Visits (Telemedicine).  Patients are able to  view lab/test results, encounter notes, upcoming appointments, etc.  Non-urgent messages can be sent to your provider as well.   To learn more about what you can do with MyChart, go to [email or web address].    Your next appointment:   12 month(s)  The format for your next appointment:   In Person  Provider:   Sanda Klein, MD {    Important Information About [place]         Signed, Sanda Klein, MD  06/28/2021 8:21 PM

## 2021-06-28 NOTE — Patient Instructions (Addendum)
Medication Instructions:  No changes *If you need a refill on your cardiac medications before your next appointment, please call your pharmacy*   Lab Work: leftYour provider would like for you to return in a few weeks to have the following labs drawn: Fasting Lipid. You do not need an appointment for the lab. Once in our office lobby there is a podium where you can sign in and ring the doorbell to alert [place] that you are here. The lab is open from 8:00 am to 4 pm; closed for lunch from 12:45pm-1:45pm.  You may also go to any of these [facility] locations:   [place] ([place]) - [place] [employer] [address]   If you have labs (blood work) drawn today and your tests are completely normal, you will receive your results only by: [employer] (if you have MyChart) OR A paper copy in the mail If you have any lab test that is abnormal or we need to change your treatment, we will call you to review the results.   Testing/Procedures: Your physician has requested that you have an echocardiogram. Echocardiography is a painless test that uses sound waves to create images of your heart. It provides your doctor with information about the size and shape of your heart and how well your heart's chambers and valves are working. You may receive an ultrasound enhancing agent through an IV if needed to better visualize your heart during the echo.This procedure takes approximately one hour. There are no restrictions for this procedure. This will take place at the [address], [address].    Follow-Up: At [facility], you and your health needs are our priority.  As part of our continuing mission to provide you with exceptional heart care, we have created designated Provider Care Teams.  These Care Teams include your primary Cardiologist (physician) and Advanced Practice Providers (APPs -  Physician Assistants and Nurse  Practitioners) who all work together to provide you with the care you need, when you need it.  We recommend signing up for the patient portal called "MyChart".  Sign up information is provided on this After Visit Summary.  MyChart is used to connect with patients for Virtual Visits (Telemedicine).  Patients are able to view lab/test results, encounter notes, upcoming appointments, etc.  Non-urgent messages can be sent to your provider as well.   To learn more about what you can do with MyChart, go to [email or web address].    Your next appointment:   12 month(s)  The format for your next appointment:   In Person  Provider:   Sanda Klein, MD {    Important Information About [place]

## 2021-07-06 ENCOUNTER — Other Ambulatory Visit (HOSPITAL_COMMUNITY): Payer: Self-pay

## 2021-07-11 DIAGNOSIS — J449 Chronic obstructive pulmonary disease, unspecified: Secondary | ICD-10-CM | POA: Diagnosis not present

## 2021-07-11 DIAGNOSIS — M159 Polyosteoarthritis, unspecified: Secondary | ICD-10-CM | POA: Diagnosis not present

## 2021-07-24 ENCOUNTER — Ambulatory Visit (HOSPITAL_COMMUNITY): Payer: Medicare Other | Attending: Cardiology

## 2021-07-24 DIAGNOSIS — I351 Nonrheumatic aortic (valve) insufficiency: Secondary | ICD-10-CM | POA: Diagnosis not present

## 2021-07-25 LAB — ECHOCARDIOGRAM COMPLETE
Area-P 1/2: 4.6 cm2
P 1/2 time: 271 msec
S' Lateral: 3.4 cm

## 2021-08-02 ENCOUNTER — Other Ambulatory Visit: Payer: Self-pay | Admitting: Cardiovascular Disease

## 2021-08-06 ENCOUNTER — Telehealth: Payer: Self-pay | Admitting: Physician Assistant

## 2021-08-09 ENCOUNTER — Ambulatory Visit (AMBULATORY_SURGERY_CENTER): Payer: Medicare Other | Admitting: *Deleted

## 2021-08-09 VITALS — Ht 64.0 in | Wt 138.0 lb

## 2021-08-09 DIAGNOSIS — R1310 Dysphagia, unspecified: Secondary | ICD-10-CM

## 2021-08-09 DIAGNOSIS — K219 Gastro-esophageal reflux disease without esophagitis: Secondary | ICD-10-CM

## 2021-08-09 DIAGNOSIS — R1013 Epigastric pain: Secondary | ICD-10-CM

## 2021-08-09 NOTE — Progress Notes (Signed)
Patient's pre-visit was done today over the phone with the patient. Name,DOB and address verified. Patient denies any allergies to Eggs and Soy. Patient denies any problems with anesthesia/sedation. Patient is not taking any diet pills or blood thinners. No home Oxygen. Pt denies any medical chart hx changes since last OV 06/13/21. Went over prep instructions with patient. Prep instructions sent to pt's MyChart -pt is aware. Patient understands to call us back with any questions or concerns. Patient is aware of our care-partner policy.

## 2021-08-10 DIAGNOSIS — J449 Chronic obstructive pulmonary disease, unspecified: Secondary | ICD-10-CM | POA: Diagnosis not present

## 2021-08-10 DIAGNOSIS — M159 Polyosteoarthritis, unspecified: Secondary | ICD-10-CM | POA: Diagnosis not present

## 2021-08-23 ENCOUNTER — Encounter: Payer: Self-pay | Admitting: Internal Medicine

## 2021-08-28 ENCOUNTER — Ambulatory Visit (AMBULATORY_SURGERY_CENTER): Payer: Medicare Other | Admitting: Internal Medicine

## 2021-08-28 ENCOUNTER — Encounter: Payer: Self-pay | Admitting: Internal Medicine

## 2021-08-28 VITALS — BP 122/69 | HR 79 | Temp 98.6°F | Resp 16 | Ht 64.0 in | Wt 138.0 lb

## 2021-08-28 DIAGNOSIS — K222 Esophageal obstruction: Secondary | ICD-10-CM | POA: Diagnosis not present

## 2021-08-28 DIAGNOSIS — R1310 Dysphagia, unspecified: Secondary | ICD-10-CM

## 2021-08-28 DIAGNOSIS — R1013 Epigastric pain: Secondary | ICD-10-CM

## 2021-08-28 DIAGNOSIS — K219 Gastro-esophageal reflux disease without esophagitis: Secondary | ICD-10-CM

## 2021-08-28 MED ORDER — SODIUM CHLORIDE 0.9 % IV SOLN: 500.0000 mL | Freq: Once | INTRAVENOUS | Status: DC

## 2021-08-28 NOTE — Progress Notes (Unsigned)
Chief Complaint: Trouble swallowing and abdominal pain   HPI:    Lorraine Weber is a 41 year old female with a past medical history as listed below including Erler's Danlos syndrome with vascular manifestations and clotting disorder on Plavix, known to Dr. Henrene Pastor, trouble swallowing and abdominal pain who was referred to me by Redmond School, MD for a complaint of trouble swallowing and abdominal pain.      11/30/2019 patient saw Dr. Henrene Pastor for follow-up after endoscopy which was completed 09/30/2019 for dysphagia.  She was found to have whitish exudate throughout the esophagus and biopsies revealed esophagitis.  There is distal narrowing which was dilated with 52 [place] Maloney dilator.  She is continued on twice daily PPI and at night H2 receptor antagonist therapy.  Prescribed mostly control of her reflux symptoms but continues with intermittent dysphagia.  At times discussed she continue PPI and H2 blocker.  Was anticipated that she is going off Plavix at the end of the year and it was noted that if dysphagia persisted or worsen then could consider repeat dilation with larger dilator.    03/03/2020 patient saw cardiology and at that time described progressive dyspnea on exertion.  Also chest pain.  CTA was negative for PE earlier that month.    03/23/2020 echo with LVEF 65-70%.  She was noted to have mild to moderate aortic valve sclerosis.  No evidence of stenosis    04/12/2020 patient seen in clinic by me and discussed that the cardiologist wanted her to have an EGD with dilation prior to proceeding with TEE for evaluation of heart issues.  Schedule patient for EGD with dilation in the [place].  She had already been holding her Plavix.    04/18/2020 EGD with esophageal dilation with a 35 [place] showed benign-appearing esophageal stenosis, normal stomach with small hiatal hernia and normal examined duodenum.    05/13/2021 CT renal stone study showed bilateral nonobstructing renal stones and aortic atherosclerosis.     05/20/2021 patient had a CT the abdomen pelvis with contrast which showed multiple nonobstructing renal calculi and small well-circumscribed intra-abdominal fluid collection along the tip of the right lobe of the liver which favored pseudocyst from prior VP shunt although no comparison imaging was available to confirm.    05/23/2021-05/28/2021 patient admitted to the hospital as she was status post V VP shunt with abdominal pain nausea and diarrhea.  She had a lot of abdominal pain which was thought likely secondary to abdominal pseudocyst.  Please see that admission note for/12/23 for further details.   4 /15/23 abdominal ultrasound with nonobstructing right nephrolithiasis and previously described fluid collection inferior to the right hepatic lobe was not visualized.    06/06/2021 patient seen in follow-up by infectious disease.  Please see their note for details.  She continued with nausea, right upper quadrant and right lower quadrant abdominal pain and night sweats.  Also had oral thrush and was started on oral Fluconazole q. 4 days x 3 doses with mild relief.  She was then prescribed nystatin swish and swallow.    Today, the patient presents to clinic accompanied by her husband who assists with history and tells me that initially the appointment was made because she was having trouble swallowing a few months back but then she had all of the other acute events occur as above with a cyst near her liver and pain in that region.  As far as swallowing patient has been treated with Fluconazole as well as Nystatin swish and swallow for oral  thrush and she feels like this helped her swallowing a little bit, but still continues with troubles with certain pills and meats etc.  Tells me it feels very similar to right before she had her last dilation which did help her a lot with the symptoms.    They also discussed today they are following with an infectious disease in regards to treatment for this fluid collection  which they think may be infectious.  Apparently the antibiotics have led to a lot of side effects including an increase in stools up to 4 times a day and patient tells me she has been noticing some swelling in her hands and feet now which she also feels like may be attributed to the antibiotics.  Apparently she has a phone call follow-up with them tomorrow.    They do ask if there are any other recommendations for this fluid collection just underneath her liver.  She does continue with pain in this area and if she takes a deep breath or sits the wrong way it is worsened.  Overall she has just been trying to sit very still so that it does not hurt.  Initially when she was in the hospital they did draw some fluid out of this and it helped a little bit, but it seems to be worsening slowly again.    Also complains of an increase in heartburn and reflux since all of this started regardless of her Omeprazole 40 mg twice a day and Famotidine 20 mg at night.    Denies nausea or vomiting, no blood in her stool.       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   Macular rash 09/25/2020   Mastoiditis     Migraines     Nephrolithiasis      X5, Dr Jeffie Pollock, passed stones, no surgery   Peptic ulcer disease     Septic joint of left wrist ([place]) 09/25/2020   Von Willebrand disease ([place])      mild form per patient           Past Surgical History:  Procedure Laterality Date   APPENDECTOMY       cerebral stent Bilateral      jugular vein   CERVICAL FUSION   2018    C5-C7   DILITATION &  CURRETTAGE/HYSTROSCOPY WITH NOVASURE ABLATION N/A 08/23/2015    Procedure: DILATATION & CURETTAGE/HYSTEROSCOPY WITH NOVASURE ABLATION;  Surgeon: Vanessa Kick, MD;  Location: [facility];  Service: Gynecology;  Laterality: N/A;   EXTRACORPOREAL SHOCK WAVE LITHOTRIPSY Left 03/19/2018    Procedure: EXTRACORPOREAL SHOCK WAVE LITHOTRIPSY (ESWL);  Surgeon: Bjorn Loser, MD;  Location: [facility];  Service: Urology;  Laterality: Left;   HAND TENDON SURGERY Left      left, complicated by MRSA   LAMINECTOMY   21016    T10-T12   spinal fluid leak patching        > 10X;[facility] -  blood patching   SPINAL FUSION   2017    T10-T11   SPINAL FUSION   2018    T10-L1   TEE WITHOUT CARDIOVERSION N/A 05/17/2020    Procedure: TRANSESOPHAGEAL ECHOCARDIOGRAM (TEE);  Surgeon: Sanda Klein, MD;  Location: [facility];  Service: Cardiovascular;  Laterality: N/A;   WISDOM TOOTH EXTRACTION                Current Outpatient Medications  Medication Sig Dispense Refill   amLODipine (NORVASC) 2.5 MG tablet Take 1 tablet (2.5 mg total) by mouth daily. 90 tablet 3   cyanocobalamin (,VITAMIN B-12,) 1000 MCG/ML injection Inject 1,000 mcg into the muscle every 30 (thirty) days.       famotidine (PEPCID) 20 MG tablet Take 1 tablet (20 mg total) by mouth at bedtime. 90 tablet 3   fluticasone (FLONASE) 50 MCG/ACT nasal spray Place 1 spray into both nostrils daily.       furosemide (LASIX) 40 MG tablet TAKE ONE TABLET BY MOUTH DAILY 90 tablet 0   gabapentin (NEURONTIN) 300 MG capsule Take 300 mg by mouth 3 (three) times daily.       losartan (COZAAR) 100 MG tablet TAKE ONE TABLET BY MOUTH ONCE DAILY 90 tablet 0   methocarbamol (ROBAXIN) 750 MG tablet Take 750 mg by mouth every 6 (six) hours as needed for muscle spasms.       metoprolol succinate (TOPROL-XL) 50 MG 24 hr tablet Take 1 tablet (50 mg total) by mouth daily. Take with or immediately following a meal. (Patient taking differently: Take 50 mg by mouth at bedtime. Take with or  immediately following a meal.) 90 tablet 3   nortriptyline (PAMELOR) 50 MG capsule Take 50 mg by mouth 2 (two) times daily.       omeprazole (PRILOSEC) 40 MG capsule Take 1 capsule (40 mg total) by mouth 2 (two) times daily. 180 capsule 3   ondansetron (ZOFRAN) 4 MG tablet Take 4 mg by mouth every 8 (eight) hours as needed for vomiting or nausea.       tiZANidine (ZANAFLEX) 2 MG tablet Take 2 mg by mouth every 6 (six) hours as needed for muscle spasms.       topiramate (TOPAMAX) 25 MG tablet Take 25 mg by mouth 3 (three) times daily.       traMADol (ULTRAM) 50 MG tablet Take 50-100 mg by mouth 4 (four) times daily as needed for moderate pain.   2   Vitamin D, Ergocalciferol, (DRISDOL) 1.25 MG (50000 UNIT) CAPS capsule Take 50,000 Units by mouth once a week.        No current facility-administered medications for this visit.           Allergies as of 06/13/2021 - Review Complete 06/13/2021  Allergen Reaction Noted   Acetazolamide Shortness Of Breath 12/31/2018   Other Other (See Comments) 01/01/2013   Vancomycin Itching and Other (See Comments) 05/23/2021   Levofloxacin Itching and Rash     Levofloxacin in d5w Swelling and Rash 08/08/2015   Sulfamethoxazole-trimethoprim Rash 01/17/2016           Family History  Problem Relation Age of Onset   Arthritis Father     Hyperlipidemia Father     Diabetes Father     Stroke Father          PTE post CVA   Kidney disease Father     Prostate cancer Paternal Grandfather     Hyperlipidemia Paternal Merchant [employer] officer  Diabetes Paternal Grandfather     Heart disease Mother          ?hypertrophic idiopathic subaortic stenosis   Heart disease Maternal Grandmother     Diabetes Maternal Grandmother     Heart disease Maternal Grandfather          HISS   Heart disease Maternal Aunt          HISS   Stroke Maternal Aunt 49   Heart disease Maternal Uncle     Heart disease Brother          HISS   Other Other          EDS in M, bro & niece    Stomach cancer Neg Hx     Colon cancer Neg Hx     Rectal cancer Neg Hx     Ovarian cancer Neg Hx        Social History         Socioeconomic History   Marital status: Married      Spouse name: Not on file   Number of children: 0   Years of education: Not on file   Highest education level: Not on file  Occupational History   Occupation: [employer]      Employer: [employer]   Occupation: disabled  Tobacco Use   Smoking status: Former      Packs/day: 0.30      Years: 10.00      Pack years: 3.00      Types: Cigarettes   Smokeless tobacco: Never   Tobacco comments:      smoked age 3-present, up to 1 ppd.05/24/14 1/2 ppd  Vaping Use   Vaping Use: Never used  Substance and Sexual Activity   Alcohol use: No      Alcohol/week: 0.0 standard drinks   Drug use: No   Sexual activity: Yes      Partners: Male      Birth control/protection: None      Comment: IUD removed 06/2015  Other Topics Concern   Not on file  Social History Narrative   Not on file    Social Determinants of Health    Financial Resource Strain: Not on file  Food Insecurity: Not on file  Transportation Needs: Not on file  Physical Activity: Not on file  Stress: Not on file  Social Connections: Not on file  Intimate Partner Violence: Not on file      Review of Systems:    Constitutional: No weight loss, fever or chills Cardiovascular: No chest pain   Respiratory: No SOB  Gastrointestinal: See HPI and otherwise negative    Physical Exam:  Vital signs: BP 128/76   Pulse (!) 101   Ht '5\' 4"'  (1.626 m)   Wt 134 lb 12.8 oz (61.1 kg)   SpO2 99%   BMI 23.14 kg/m     Constitutional:   Pleasant Caucasian female appears to be in NAD, Well developed, Well nourished, alert and cooperative Respiratory: Respirations even and unlabored. Lungs clear to auscultation bilaterally.   No wheezes, crackles, or rhonchi.  Cardiovascular: Normal S1, S2. No MRG. Regular rate and rhythm. No peripheral  edema, cyanosis or pallor.  Gastrointestinal:  Soft, nondistended, marked epigastric and right upper quadrant TTP with involuntary guarding. Normal bowel sounds. No appreciable masses or hepatomegaly. Rectal:  Not performed.  Psychiatric: Oriented to person, place and time. Demonstrates good judgement and reason without abnormal affect or behaviors.   RELEVANT LABS AND  IMAGING: CBC Labs (Brief)          Component Value Date/Time    WBC 8.2 05/13/2021 1132    RBC 4.98 05/13/2021 1132    HGB 13.8 05/13/2021 1132    HGB 12.8 05/12/2020 1432    HGB 13.7 03/15/2013 1345    HCT 41.7 05/13/2021 1132    HCT 38.4 05/12/2020 1432    HCT 41.2 03/15/2013 1345    PLT 309 05/13/2021 1132    PLT 311 05/12/2020 1432    MCV 83.7 05/13/2021 1132    MCV 85 05/12/2020 1432    MCV 88.2 03/15/2013 1345    MCH 27.7 05/13/2021 1132    MCHC 33.1 05/13/2021 1132    RDW 14.6 05/13/2021 1132    RDW 14.2 05/12/2020 1432    RDW 18.7 (H) 03/15/2013 1345    LYMPHSABS 2.1 09/16/2017 1709    LYMPHSABS 2.4 03/15/2013 1345    MONOABS 0.6 09/16/2017 1709    MONOABS 0.7 03/15/2013 1345    EOSABS 0.0 09/16/2017 1709    EOSABS 0.1 03/15/2013 1345    BASOSABS 0.0 09/16/2017 1709    BASOSABS 0.0 03/15/2013 1345        CMP     Labs (Brief)          Component Value Date/Time    NA 137 05/13/2021 1132    NA 141 05/12/2020 1432    NA 138 03/15/2013 1345    K 4.0 05/13/2021 1132    K 3.3 (L) 03/15/2013 1345    CL 99 05/13/2021 1132    CO2 27 05/13/2021 1132    CO2 26 03/15/2013 1345    GLUCOSE 94 05/13/2021 1132    GLUCOSE 97 03/15/2013 1345    BUN 9 05/13/2021 1132    BUN 11 05/12/2020 1432    BUN 6.8 (L) 03/15/2013 1345    CREATININE 0.91 05/13/2021 1132    CREATININE 0.67 02/16/2016 1219    CREATININE 0.7 03/15/2013 1345    CALCIUM 9.7 05/13/2021 1132    CALCIUM 9.3 03/15/2013 1345    PROT 8.4 (H) 05/13/2021 1132    PROT 7.1 03/15/2013 1345    ALBUMIN 4.5 05/13/2021 1132    ALBUMIN 4.0  03/15/2013 1345    AST 13 (L) 05/13/2021 1132    AST 17 03/15/2013 1345    ALT 6 05/13/2021 1132    ALT 13 03/15/2013 1345    ALKPHOS 101 05/13/2021 1132    ALKPHOS 112 03/15/2013 1345    BILITOT 0.8 05/13/2021 1132    BILITOT 0.27 03/15/2013 1345    GFRNONAA >60 05/13/2021 1132    GFRAA 88 03/03/2020 1500        Assessment: 1.  Epigastric/right upper quadrant pain: With liver cyst in this area and recent admission in regards to management of this, continues with pain now 2.  Dysphagia: History of benign-appearing esophageal stenosis dilated last about a year ago which was helpful for the patient, now with increasing symptoms; likely repeat stricture 3.  Thrush: Recently treated with Fluconazole and Nystatin swish and swallow, does feel slightly better   Plan: 1.  Discussed with patient that she is a very complicated case.  At this moment I do not have any further recommendations in regards to her ongoing right upper quadrant pain.  If she did have relief when some of this fluid was drawn off that could be a consideration in the future.  She would need an appointment with interventional radiology for further intervention.  She tells me she has follow-up with her infectious disease specialist tomorrow and they had mentioned possibly ordering a repeat CT with IR. 2.  As far as dysphagia patient would likely benefit from a repeat EGD with dilation.  I am not sure that right now it is a great time for that.  She seems to be doing okay and able to swallow most things as long as she is careful.  Encouraged her to continue to do this.  If she becomes more stable at some point from all of her acute events then she could proceed with repeat EGD. 3.  Increased Famotidine to 40 mg twice a day, 30-60 minutes before breakfast and dinner.  Prescribed #60 with 5 refills. 4.  Continue Omeprazole 40 mg twice daily 5.  I will let Dr. Henrene Pastor review all of patient's recent admissions and imaging and see if he has  any further recommendations going forward. 6.  Patient to follow in clinic with [place] as needed/directed by her other physicians.   Lorraine Newer, PA-C [facility]  Recent complete H&P as outlined above.  The since that time the patient has had cardiology evaluation with recent echocardiogram.  Reviewed.  She is now for upper endoscopy with esophageal dilation for symptomatic esophageal stricture.  Patient is high risk given her myriad of comorbidities.  She understands.  Not taking Plavix.

## 2021-08-28 NOTE — Patient Instructions (Addendum)
Post Dilation diet instructions given.  Continue present medications.  Resume care with your primary providers.  GI follow-up as needed.   YOU HAD AN ENDOSCOPIC PROCEDURE TODAY AT [facility]:   Refer to the procedure report that was given to you for any specific questions about what was found during the examination.  If the procedure report does not answer your questions, please call your gastroenterologist to clarify.  If you requested that your care partner not be given the details of your procedure findings, then the procedure report has been included in a sealed envelope for you to review at your convenience later.  YOU SHOULD EXPECT: Some feelings of bloating in the abdomen. Passage of more gas than usual.  Walking can help get rid of the air that was put into your GI tract during the procedure and reduce the bloating. If you had a lower endoscopy (such as a colonoscopy or flexible sigmoidoscopy) you may notice spotting of blood in your stool or on the toilet paper. If you underwent a bowel prep for your procedure, you may not have a normal bowel movement for a few days.  Please Note:  You might notice some irritation and congestion in your nose or some drainage.  This is from the oxygen used during your procedure.  There is no need for concern and it should clear up in a day or so.  SYMPTOMS TO REPORT IMMEDIATELY:  Following upper endoscopy (EGD)  Vomiting of blood or coffee ground material  New chest pain or pain under the shoulder blades  Painful or persistently difficult swallowing  New shortness of breath  Fever of 100F or higher  Black, tarry-looking stools  For urgent or emergent issues, a gastroenterologist can be reached at any hour by calling [phone]. Do not use MyChart messaging for urgent concerns.    DIET:  Post Dilation diet today then regular diet tomorrow.  Instructions and handout provided.  Drink plenty of fluids but you should avoid alcoholic  beverages for 24 hours.  ACTIVITY:  You should plan to take it easy for the rest of today and you should NOT DRIVE or use heavy machinery until tomorrow (because of the sedation medicines used during the test).    FOLLOW UP: Our staff will call the number listed on your records the next business day following your procedure.  We will call around 7:15- 8:00 am to check on you and address any questions or concerns that you may have regarding the information given to you following your procedure. If we do not reach you, we will leave a message.  If you develop any symptoms (ie: fever, flu-like symptoms, shortness of breath, cough etc.) before then, please call [phone].  If you test positive for Covid 19 in the 2 weeks post procedure, please call and report this information to [place].    If any biopsies were taken you will be contacted by phone or by letter within the next 1-3 weeks.  Please call us at [phone] if you have not heard about the biopsies in 3 weeks.    SIGNATURES/CONFIDENTIALITY: You and/or your care partner have signed paperwork which will be entered into your electronic medical record.  These signatures attest to the fact that that the information above on your After Visit Summary has been reviewed and is understood.  Full responsibility of the confidentiality of this discharge information lies with you and/or your care-partner.

## 2021-08-28 NOTE — Progress Notes (Signed)
Called to room to assist during endoscopic procedure.  Patient ID and intended procedure confirmed with present staff. Received instructions for my participation in the procedure from the performing physician.  

## 2021-08-28 NOTE — Op Note (Signed)
[place] [place] Patient Name: Lorraine Weber Procedure Date: 08/28/2021 3:25 PM MRN: [id number] Endoscopist: Docia Chuck. Henrene Pastor , MD Age: 41 Referring MD:  Date of Birth: [birth year] Gender: Female Account #: [id number] Procedure:                Upper GI endoscopy with [facility] dilation of the                            esophagus. 70 [place] Indications:              Dysphagia, Therapeutic procedure, Esophageal reflux Medicines:                Monitored Anesthesia Care Procedure:                Pre-Anesthesia Assessment:                           - Prior to the procedure, a History and Physical                            was performed, and patient medications and                            allergies were reviewed. The patient's tolerance of                            previous anesthesia was also reviewed. The risks                            and benefits of the procedure and the sedation                            options and risks were discussed with the patient.                            All questions were answered, and informed consent                            was obtained. Prior Anticoagulants: The patient has                            taken no previous anticoagulant or antiplatelet                            agents. ASA Grade Assessment: III - A patient with                            severe systemic disease. After reviewing the risks                            and benefits, the patient was deemed in                            satisfactory condition to undergo the procedure.  After obtaining informed consent, the endoscope was                            passed under direct vision. Throughout the                            procedure, the patient's blood pressure, pulse, and                            oxygen saturations were monitored continuously. The                            Endoscope was introduced through the mouth, and                             advanced to the second part of duodenum. The upper                            GI endoscopy was accomplished without difficulty.                            The patient tolerated the procedure well. Scope In: Scope Out: Findings:                 One benign-appearing, intrinsic moderate stenosis                            was found 35 cm from the incisors. This stenosis                            measured 1.5 cm (inner diameter). The scope was                            withdrawn. Dilation was performed with a Maloney                            dilator with no resistance at 24 Fr.                           The exam of the esophagus was otherwise normal.                           The stomach was normal.                           The examined duodenum was normal.                           The cardia and gastric fundus were normal on                            retroflexion. Complications:            No immediate complications. Estimated Blood Loss:     Estimated blood loss: none. Impression:               -  Benign-appearing esophageal stenosis. Dilated.                           - Normal stomach.                           - Normal examined duodenum.                           - No specimens collected. Recommendation:           1. Patient has a contact number available for                            emergencies. The signs and symptoms of potential                            delayed complications were discussed with the                            patient. Return to normal activities tomorrow.                            Written discharge instructions were provided to the                            patient.                           2. Post dilation diet.                           3. Continue present medications.                           4. Resume care with your primary providers. GI                            follow-up as needed Docia Chuck. Henrene Pastor, MD 08/28/2021 3:57:52 PM This report has been  signed electronically.

## 2021-08-28 NOTE — Progress Notes (Unsigned)
Pt in recovery with monitors in place, VSS. Report given to receiving RN. Bite guard was placed with pt awake to ensure comfort. Tech removed bite guard at end of procedure. No dental or soft tissue damage noted.

## 2021-08-29 ENCOUNTER — Telehealth: Payer: Self-pay

## 2021-08-29 NOTE — Telephone Encounter (Signed)
Left message on follow up call. 

## 2021-09-03 DIAGNOSIS — H5713 Ocular pain, bilateral: Secondary | ICD-10-CM | POA: Diagnosis not present

## 2021-09-07 DIAGNOSIS — G932 Benign intracranial hypertension: Secondary | ICD-10-CM | POA: Diagnosis not present

## 2021-09-07 DIAGNOSIS — Z79899 Other long term (current) drug therapy: Secondary | ICD-10-CM | POA: Diagnosis not present

## 2021-09-07 DIAGNOSIS — Z4541 Encounter for adjustment and management of cerebrospinal fluid drainage device: Secondary | ICD-10-CM | POA: Diagnosis not present

## 2021-09-11 DIAGNOSIS — I5031 Acute diastolic (congestive) heart failure: Secondary | ICD-10-CM | POA: Diagnosis not present

## 2021-09-11 DIAGNOSIS — Z6823 Body mass index (BMI) 23.0-23.9, adult: Secondary | ICD-10-CM | POA: Diagnosis not present

## 2021-09-11 DIAGNOSIS — Z0001 Encounter for general adult medical examination with abnormal findings: Secondary | ICD-10-CM | POA: Diagnosis not present

## 2021-09-11 DIAGNOSIS — G43009 Migraine without aura, not intractable, without status migrainosus: Secondary | ICD-10-CM | POA: Diagnosis not present

## 2021-09-11 DIAGNOSIS — E039 Hypothyroidism, unspecified: Secondary | ICD-10-CM | POA: Diagnosis not present

## 2021-09-11 DIAGNOSIS — E559 Vitamin D deficiency, unspecified: Secondary | ICD-10-CM | POA: Diagnosis not present

## 2021-09-11 DIAGNOSIS — I10 Essential (primary) hypertension: Secondary | ICD-10-CM | POA: Diagnosis not present

## 2021-09-11 DIAGNOSIS — Q796 Ehlers-Danlos syndrome, unspecified: Secondary | ICD-10-CM | POA: Diagnosis not present

## 2021-09-11 DIAGNOSIS — D518 Other vitamin B12 deficiency anemias: Secondary | ICD-10-CM | POA: Diagnosis not present

## 2021-09-11 DIAGNOSIS — R300 Dysuria: Secondary | ICD-10-CM | POA: Diagnosis not present

## 2021-09-18 DIAGNOSIS — R7989 Other specified abnormal findings of blood chemistry: Secondary | ICD-10-CM | POA: Diagnosis not present

## 2021-09-18 DIAGNOSIS — E039 Hypothyroidism, unspecified: Secondary | ICD-10-CM | POA: Diagnosis not present

## 2021-10-02 ENCOUNTER — Other Ambulatory Visit: Payer: Self-pay | Admitting: Internal Medicine

## 2021-10-02 ENCOUNTER — Other Ambulatory Visit: Payer: Self-pay | Admitting: Physician Assistant

## 2021-10-16 DIAGNOSIS — Q796 Ehlers-Danlos syndrome, unspecified: Secondary | ICD-10-CM | POA: Diagnosis not present

## 2021-10-16 DIAGNOSIS — R760 Raised antibody titer: Secondary | ICD-10-CM | POA: Diagnosis not present

## 2021-10-16 DIAGNOSIS — Q7961 Classical Ehlers-Danlos syndrome: Secondary | ICD-10-CM | POA: Diagnosis not present

## 2021-10-16 DIAGNOSIS — G9600 Cerebrospinal fluid leak, unspecified: Secondary | ICD-10-CM | POA: Diagnosis not present

## 2021-10-16 DIAGNOSIS — Z79899 Other long term (current) drug therapy: Secondary | ICD-10-CM | POA: Diagnosis not present

## 2021-10-16 DIAGNOSIS — I7300 Raynaud's syndrome without gangrene: Secondary | ICD-10-CM | POA: Diagnosis not present

## 2021-10-16 DIAGNOSIS — G932 Benign intracranial hypertension: Secondary | ICD-10-CM | POA: Diagnosis not present

## 2021-10-16 DIAGNOSIS — I330 Acute and subacute infective endocarditis: Secondary | ICD-10-CM | POA: Diagnosis not present

## 2021-10-16 DIAGNOSIS — M25539 Pain in unspecified wrist: Secondary | ICD-10-CM | POA: Diagnosis not present

## 2021-10-16 DIAGNOSIS — Z8261 Family history of arthritis: Secondary | ICD-10-CM | POA: Diagnosis not present

## 2021-10-16 DIAGNOSIS — M130 Polyarthritis, unspecified: Secondary | ICD-10-CM | POA: Diagnosis not present

## 2021-10-16 DIAGNOSIS — R682 Dry mouth, unspecified: Secondary | ICD-10-CM | POA: Diagnosis not present

## 2021-10-16 DIAGNOSIS — M542 Cervicalgia: Secondary | ICD-10-CM | POA: Diagnosis not present

## 2021-10-16 DIAGNOSIS — R21 Rash and other nonspecific skin eruption: Secondary | ICD-10-CM | POA: Diagnosis not present

## 2021-10-16 DIAGNOSIS — R519 Headache, unspecified: Secondary | ICD-10-CM | POA: Diagnosis not present

## 2021-10-16 DIAGNOSIS — R6889 Other general symptoms and signs: Secondary | ICD-10-CM | POA: Diagnosis not present

## 2021-10-16 DIAGNOSIS — R768 Other specified abnormal immunological findings in serum: Secondary | ICD-10-CM | POA: Diagnosis not present

## 2021-10-16 DIAGNOSIS — G43909 Migraine, unspecified, not intractable, without status migrainosus: Secondary | ICD-10-CM | POA: Diagnosis not present

## 2021-10-16 DIAGNOSIS — R202 Paresthesia of skin: Secondary | ICD-10-CM | POA: Diagnosis not present

## 2021-10-16 DIAGNOSIS — M359 Systemic involvement of connective tissue, unspecified: Secondary | ICD-10-CM | POA: Diagnosis not present

## 2021-10-22 DIAGNOSIS — G932 Benign intracranial hypertension: Secondary | ICD-10-CM | POA: Diagnosis not present

## 2021-10-23 ENCOUNTER — Encounter: Payer: Self-pay | Admitting: *Deleted

## 2021-10-23 ENCOUNTER — Telehealth: Payer: Self-pay | Admitting: *Deleted

## 2021-10-23 NOTE — Patient Outreach (Addendum)
  Care Coordination   Initial Visit Note   10/23/2021 Name: Lorraine Weber MRN: [id number] DOB: September 20, 1980  Lorraine Weber is a 41 y.o. year old female who sees Redmond School, MD for primary care. I spoke with  Lorraine Weber by phone today.  What matters to the patients health and wellness today?  Report she is doing well, will need brain shunt placement at [facility] in the next couple weeks.  This is not her first shunt, previous one removed due to infection.  Surgeon waiting to have particular style of shunt delivered prior to setting exact date.  Husband will provide support pos surgery.   Denies any urgent concerns, encouraged to contact this care manager with questions.      Goals Addressed               This Visit's Progress     Shunt placement without complications (pt-stated)        Care Coordination Interventions: Evaluation of current treatment plan related to shunt placement and patient's adherence to plan as established by provider Advised patient to Notify surgeon or PCP if she has concerns prior to surgery Reviewed medications with patient and discussed affordability Reviewed scheduled/upcoming provider appointments including plan for surgery, will follow up with surgeon regarding specific date Discussed plans with patient for ongoing care management follow up and provided patient with direct contact information for care management team Assessed social determinant of health barriers         SDOH assessments and interventions completed:  Yes  SDOH Interventions Today    Flowsheet Row Most Recent Value  SDOH Interventions   Food Insecurity Interventions Intervention Not Indicated  Housing Interventions Intervention Not Indicated  Transportation Interventions Intervention Not Indicated  Utilities Interventions Intervention Not Indicated        Care Coordination Interventions Activated:  Yes  Care Coordination Interventions:  Yes, provided   Follow up plan:  Follow up call scheduled for 10/16    Encounter Outcome:  Pt. Visit Completed   Valente David, RN, MSN, [place] Care Management Care Management Coordinator [phone]

## 2021-10-23 NOTE — Patient Instructions (Signed)
Visit Information  Thank you for taking time to visit with me today. Please don't hesitate to contact me if I can be of assistance to you before our next scheduled telephone appointment.  Following are the goals we discussed today:  Notify RNCM with concerns.  Our next appointment is by telephone on 10/16  Please call the care guide team at [phone] if you need to cancel or reschedule your appointment.   Please call the Suicide and Crisis Lifeline: 988 call the [place] National Suicide Prevention Lifeline: 563 399 7789 or TTY: 434-231-5017 TTY 319-145-1227) to talk to a trained counselor call 1-800-273-TALK (toll free, 24 hour hotline) call the [facility]: [phone] call 911 if you are experiencing a Mental Health or [place] or need someone to talk to.  Patient verbalizes understanding of instructions and care plan provided today and agrees to view in [place]. Active MyChart status and patient understanding of how to access instructions and care plan via MyChart confirmed with patient.     The patient has been provided with contact information for the care management team and has been advised to call with any health related questions or concerns.   [place] Management Care Management Coordinator [phone]

## 2021-10-27 DIAGNOSIS — Z01818 Encounter for other preprocedural examination: Secondary | ICD-10-CM | POA: Diagnosis not present

## 2021-10-27 DIAGNOSIS — G932 Benign intracranial hypertension: Secondary | ICD-10-CM | POA: Diagnosis not present

## 2021-10-29 DIAGNOSIS — G43909 Migraine, unspecified, not intractable, without status migrainosus: Secondary | ICD-10-CM | POA: Diagnosis not present

## 2021-10-29 DIAGNOSIS — Z01818 Encounter for other preprocedural examination: Secondary | ICD-10-CM | POA: Diagnosis not present

## 2021-10-29 DIAGNOSIS — G932 Benign intracranial hypertension: Secondary | ICD-10-CM | POA: Diagnosis not present

## 2021-10-29 DIAGNOSIS — M1990 Unspecified osteoarthritis, unspecified site: Secondary | ICD-10-CM | POA: Diagnosis not present

## 2021-10-29 DIAGNOSIS — K219 Gastro-esophageal reflux disease without esophagitis: Secondary | ICD-10-CM | POA: Diagnosis not present

## 2021-10-29 DIAGNOSIS — Z8711 Personal history of peptic ulcer disease: Secondary | ICD-10-CM | POA: Diagnosis not present

## 2021-10-29 DIAGNOSIS — Z982 Presence of cerebrospinal fluid drainage device: Secondary | ICD-10-CM | POA: Diagnosis not present

## 2021-10-29 DIAGNOSIS — Z87440 Personal history of urinary (tract) infections: Secondary | ICD-10-CM | POA: Diagnosis not present

## 2021-10-29 DIAGNOSIS — Z981 Arthrodesis status: Secondary | ICD-10-CM | POA: Diagnosis not present

## 2021-10-29 DIAGNOSIS — G9600 Cerebrospinal fluid leak, unspecified: Secondary | ICD-10-CM | POA: Diagnosis not present

## 2021-10-29 DIAGNOSIS — Z9889 Other specified postprocedural states: Secondary | ICD-10-CM | POA: Diagnosis not present

## 2021-10-29 DIAGNOSIS — Z8614 Personal history of Methicillin resistant Staphylococcus aureus infection: Secondary | ICD-10-CM | POA: Diagnosis not present

## 2021-10-29 DIAGNOSIS — Z886 Allergy status to analgesic agent status: Secondary | ICD-10-CM | POA: Diagnosis not present

## 2021-10-29 DIAGNOSIS — Z881 Allergy status to other antibiotic agents status: Secondary | ICD-10-CM | POA: Diagnosis not present

## 2021-10-29 DIAGNOSIS — Z87442 Personal history of urinary calculi: Secondary | ICD-10-CM | POA: Diagnosis not present

## 2021-10-29 DIAGNOSIS — Z87891 Personal history of nicotine dependence: Secondary | ICD-10-CM | POA: Diagnosis not present

## 2021-10-29 DIAGNOSIS — G3184 Mild cognitive impairment, so stated: Secondary | ICD-10-CM | POA: Diagnosis not present

## 2021-10-29 DIAGNOSIS — Z9089 Acquired absence of other organs: Secondary | ICD-10-CM | POA: Diagnosis not present

## 2021-10-29 DIAGNOSIS — I10 Essential (primary) hypertension: Secondary | ICD-10-CM | POA: Diagnosis not present

## 2021-10-29 DIAGNOSIS — R69 Illness, unspecified: Secondary | ICD-10-CM | POA: Diagnosis not present

## 2021-10-29 DIAGNOSIS — Z79899 Other long term (current) drug therapy: Secondary | ICD-10-CM | POA: Diagnosis not present

## 2021-10-29 DIAGNOSIS — G8929 Other chronic pain: Secondary | ICD-10-CM | POA: Diagnosis not present

## 2021-10-29 DIAGNOSIS — Z888 Allergy status to other drugs, medicaments and biological substances status: Secondary | ICD-10-CM | POA: Diagnosis not present

## 2021-10-29 DIAGNOSIS — Z8616 Personal history of COVID-19: Secondary | ICD-10-CM | POA: Diagnosis not present

## 2021-10-31 DIAGNOSIS — Z982 Presence of cerebrospinal fluid drainage device: Secondary | ICD-10-CM | POA: Diagnosis not present

## 2021-10-31 DIAGNOSIS — G932 Benign intracranial hypertension: Secondary | ICD-10-CM | POA: Diagnosis not present

## 2021-10-31 DIAGNOSIS — R69 Illness, unspecified: Secondary | ICD-10-CM | POA: Diagnosis not present

## 2021-11-01 DIAGNOSIS — R69 Illness, unspecified: Secondary | ICD-10-CM | POA: Diagnosis not present

## 2021-11-01 DIAGNOSIS — Z982 Presence of cerebrospinal fluid drainage device: Secondary | ICD-10-CM | POA: Diagnosis not present

## 2021-11-16 DIAGNOSIS — Z6823 Body mass index (BMI) 23.0-23.9, adult: Secondary | ICD-10-CM | POA: Diagnosis not present

## 2021-11-16 DIAGNOSIS — H9201 Otalgia, right ear: Secondary | ICD-10-CM | POA: Diagnosis not present

## 2021-11-19 DIAGNOSIS — G932 Benign intracranial hypertension: Secondary | ICD-10-CM | POA: Diagnosis not present

## 2021-11-26 ENCOUNTER — Ambulatory Visit: Payer: Self-pay | Admitting: *Deleted

## 2021-11-26 NOTE — Patient Outreach (Signed)
  Care Coordination   Follow Up Visit Note   11/26/2021 Name: Lorraine Weber MRN: [id number] DOB: 1980/12/20  Lorraine Weber is a 41 y.o. year old female who sees Redmond School, MD for primary care. I spoke with  Lorraine Weber by phone today.  What matters to the patients health and wellness today?  Confirms shunt was placed, denies pain or discomfort but does report low grade fever and intermittent headache.  She has followed up with provider, will possibly have lumbar puncture later this week.  Does not feel further RNCM follow up is needed but will call if she has questions.    Goals Addressed               This Visit's Progress     COMPLETED: Shunt placement without complications (pt-stated)   On track     Care Coordination Interventions: Evaluation of current treatment plan related to shunt placement and patient's adherence to plan as established by provider Advised patient to Notify surgeon or PCP if she has concerns prior to surgery Reviewed medications with patient and discussed affordability Reviewed scheduled/upcoming provider appointments including plan for surgery, will follow up with surgeon regarding specific date Discussed plans with patient for ongoing care management follow up and provided patient with direct contact information for care management team Assessed social determinant of health barriers         SDOH assessments and interventions completed:  No     Care Coordination Interventions Activated:  Yes  Care Coordination Interventions:  Yes, provided   Follow up plan: No further intervention required.   Encounter Outcome:  Pt. Visit Completed   Valente David, RN, MSN, [place] Care Management Care Management Coordinator [phone]

## 2021-11-26 NOTE — Patient Instructions (Signed)
Visit Information  Thank you for taking time to visit with me today. Please don't hesitate to contact me if I can be of assistance to you.  Following are the goals we discussed today:  Call this RNCM with questions/concerns   Please call the Suicide and Crisis Lifeline: 988 call the [place] National Suicide Prevention Lifeline: (773)516-7788 or TTY: 539-647-6955 TTY 6676131637) to talk to a trained counselor call 1-800-273-TALK (toll free, 24 hour hotline) call the [facility]: [phone] call 911 if you are experiencing a Mental Health or [place] or need someone to talk to.  Patient verbalizes understanding of instructions and care plan provided today and agrees to view in [place]. Active MyChart status and patient understanding of how to access instructions and care plan via MyChart confirmed with patient.     The patient has been provided with contact information for the care management team and has been advised to call with any health related questions or concerns.   Valente David, RN, MSN, [place] Care Management Care Management Coordinator [phone]

## 2021-11-30 DIAGNOSIS — G932 Benign intracranial hypertension: Secondary | ICD-10-CM | POA: Diagnosis not present

## 2021-11-30 DIAGNOSIS — M542 Cervicalgia: Secondary | ICD-10-CM | POA: Diagnosis not present

## 2021-11-30 DIAGNOSIS — Z87891 Personal history of nicotine dependence: Secondary | ICD-10-CM | POA: Diagnosis not present

## 2021-11-30 DIAGNOSIS — R509 Fever, unspecified: Secondary | ICD-10-CM | POA: Diagnosis not present

## 2021-11-30 DIAGNOSIS — Z982 Presence of cerebrospinal fluid drainage device: Secondary | ICD-10-CM | POA: Diagnosis not present

## 2021-11-30 DIAGNOSIS — R519 Headache, unspecified: Secondary | ICD-10-CM | POA: Diagnosis not present

## 2021-12-04 DIAGNOSIS — T85730A Infection and inflammatory reaction due to ventricular intracranial (communicating) shunt, initial encounter: Secondary | ICD-10-CM | POA: Diagnosis not present

## 2021-12-04 DIAGNOSIS — R519 Headache, unspecified: Secondary | ICD-10-CM | POA: Diagnosis not present

## 2021-12-04 DIAGNOSIS — Z886 Allergy status to analgesic agent status: Secondary | ICD-10-CM | POA: Diagnosis not present

## 2021-12-04 DIAGNOSIS — K219 Gastro-esophageal reflux disease without esophagitis: Secondary | ICD-10-CM | POA: Diagnosis not present

## 2021-12-04 DIAGNOSIS — Z982 Presence of cerebrospinal fluid drainage device: Secondary | ICD-10-CM | POA: Diagnosis not present

## 2021-12-04 DIAGNOSIS — I10 Essential (primary) hypertension: Secondary | ICD-10-CM | POA: Diagnosis not present

## 2021-12-04 DIAGNOSIS — Z8349 Family history of other endocrine, nutritional and metabolic diseases: Secondary | ICD-10-CM | POA: Diagnosis not present

## 2021-12-04 DIAGNOSIS — Z881 Allergy status to other antibiotic agents status: Secondary | ICD-10-CM | POA: Diagnosis not present

## 2021-12-04 DIAGNOSIS — G8929 Other chronic pain: Secondary | ICD-10-CM | POA: Diagnosis not present

## 2021-12-05 DIAGNOSIS — Z982 Presence of cerebrospinal fluid drainage device: Secondary | ICD-10-CM | POA: Diagnosis not present

## 2021-12-05 DIAGNOSIS — G8929 Other chronic pain: Secondary | ICD-10-CM | POA: Diagnosis not present

## 2021-12-05 DIAGNOSIS — R519 Headache, unspecified: Secondary | ICD-10-CM | POA: Diagnosis not present

## 2021-12-17 DIAGNOSIS — G932 Benign intracranial hypertension: Secondary | ICD-10-CM | POA: Diagnosis not present

## 2021-12-31 ENCOUNTER — Other Ambulatory Visit: Payer: Self-pay | Admitting: Internal Medicine

## 2022-01-07 DIAGNOSIS — R1011 Right upper quadrant pain: Secondary | ICD-10-CM | POA: Diagnosis not present

## 2022-01-07 DIAGNOSIS — Q796 Ehlers-Danlos syndrome, unspecified: Secondary | ICD-10-CM | POA: Diagnosis not present

## 2022-01-07 DIAGNOSIS — Z6824 Body mass index (BMI) 24.0-24.9, adult: Secondary | ICD-10-CM | POA: Diagnosis not present

## 2022-01-07 DIAGNOSIS — M545 Low back pain, unspecified: Secondary | ICD-10-CM | POA: Diagnosis not present

## 2022-01-07 DIAGNOSIS — I10 Essential (primary) hypertension: Secondary | ICD-10-CM | POA: Diagnosis not present

## 2022-01-07 DIAGNOSIS — R3129 Other microscopic hematuria: Secondary | ICD-10-CM | POA: Diagnosis not present

## 2022-01-08 ENCOUNTER — Other Ambulatory Visit (HOSPITAL_COMMUNITY): Payer: Self-pay | Admitting: Internal Medicine

## 2022-01-08 DIAGNOSIS — R1011 Right upper quadrant pain: Secondary | ICD-10-CM

## 2022-01-10 DIAGNOSIS — J449 Chronic obstructive pulmonary disease, unspecified: Secondary | ICD-10-CM | POA: Diagnosis not present

## 2022-01-10 DIAGNOSIS — M159 Polyosteoarthritis, unspecified: Secondary | ICD-10-CM | POA: Diagnosis not present

## 2022-01-17 ENCOUNTER — Ambulatory Visit (HOSPITAL_COMMUNITY)
Admission: RE | Admit: 2022-01-17 | Discharge: 2022-01-17 | Disposition: A | Discharge status: Home / Self Care | Payer: Medicare Other | Source: Ambulatory Visit | Attending: Internal Medicine | Admitting: Internal Medicine

## 2022-01-17 DIAGNOSIS — R1011 Right upper quadrant pain: Secondary | ICD-10-CM | POA: Insufficient documentation

## 2022-01-17 DIAGNOSIS — N200 Calculus of kidney: Secondary | ICD-10-CM | POA: Diagnosis not present

## 2022-01-17 MED ORDER — IOHEXOL 300 MG/ML  SOLN
150.0000 mL | Freq: Once | INTRAMUSCULAR | Status: AC | PRN
  Administered 2022-01-17: 150 mL via INTRAVENOUS

## 2022-01-29 ENCOUNTER — Other Ambulatory Visit: Payer: Self-pay | Admitting: Physician Assistant

## 2022-01-29 DIAGNOSIS — Z5181 Encounter for therapeutic drug level monitoring: Secondary | ICD-10-CM | POA: Diagnosis not present

## 2022-01-29 DIAGNOSIS — Z79899 Other long term (current) drug therapy: Secondary | ICD-10-CM | POA: Diagnosis not present

## 2022-01-29 DIAGNOSIS — R202 Paresthesia of skin: Secondary | ICD-10-CM | POA: Diagnosis not present

## 2022-02-10 DIAGNOSIS — J449 Chronic obstructive pulmonary disease, unspecified: Secondary | ICD-10-CM | POA: Diagnosis not present

## 2022-02-10 DIAGNOSIS — M159 Polyosteoarthritis, unspecified: Secondary | ICD-10-CM | POA: Diagnosis not present

## 2022-02-14 DIAGNOSIS — J329 Chronic sinusitis, unspecified: Secondary | ICD-10-CM | POA: Diagnosis not present

## 2022-03-06 DIAGNOSIS — Z8249 Family history of ischemic heart disease and other diseases of the circulatory system: Secondary | ICD-10-CM | POA: Diagnosis not present

## 2022-03-06 DIAGNOSIS — I700 Atherosclerosis of aorta: Secondary | ICD-10-CM | POA: Diagnosis not present

## 2022-03-07 LAB — LIPID PANEL
Chol/HDL Ratio: 2.2 ratio (ref 0.0–4.4)
Cholesterol, Total: 129 mg/dL (ref 100–199)
HDL: 59 mg/dL (ref >39)
LDL Chol Calc (NIH): 55 mg/dL (ref 0–99)
Triglycerides: 77 mg/dL (ref 0–149)
VLDL Cholesterol Cal: 15 mg/dL (ref 5–40)

## 2022-03-13 DIAGNOSIS — J449 Chronic obstructive pulmonary disease, unspecified: Secondary | ICD-10-CM | POA: Diagnosis not present

## 2022-03-13 DIAGNOSIS — M159 Polyosteoarthritis, unspecified: Secondary | ICD-10-CM | POA: Diagnosis not present

## 2022-03-15 DIAGNOSIS — I10 Essential (primary) hypertension: Secondary | ICD-10-CM | POA: Diagnosis not present

## 2022-03-15 DIAGNOSIS — Z8614 Personal history of Methicillin resistant Staphylococcus aureus infection: Secondary | ICD-10-CM | POA: Diagnosis not present

## 2022-03-15 DIAGNOSIS — Z87442 Personal history of urinary calculi: Secondary | ICD-10-CM | POA: Diagnosis not present

## 2022-03-15 DIAGNOSIS — Z87891 Personal history of nicotine dependence: Secondary | ICD-10-CM | POA: Diagnosis not present

## 2022-03-15 DIAGNOSIS — K219 Gastro-esophageal reflux disease without esophagitis: Secondary | ICD-10-CM | POA: Diagnosis not present

## 2022-03-15 DIAGNOSIS — D6801 Von willebrand disease, type 1: Secondary | ICD-10-CM | POA: Diagnosis not present

## 2022-03-15 DIAGNOSIS — Z8669 Personal history of other diseases of the nervous system and sense organs: Secondary | ICD-10-CM | POA: Diagnosis not present

## 2022-03-15 DIAGNOSIS — Z4541 Encounter for adjustment and management of cerebrospinal fluid drainage device: Secondary | ICD-10-CM | POA: Diagnosis not present

## 2022-03-15 DIAGNOSIS — Z8711 Personal history of peptic ulcer disease: Secondary | ICD-10-CM | POA: Diagnosis not present

## 2022-03-15 DIAGNOSIS — R519 Headache, unspecified: Secondary | ICD-10-CM | POA: Diagnosis not present

## 2022-03-15 DIAGNOSIS — G932 Benign intracranial hypertension: Secondary | ICD-10-CM | POA: Diagnosis not present

## 2022-03-15 DIAGNOSIS — H538 Other visual disturbances: Secondary | ICD-10-CM | POA: Diagnosis not present

## 2022-04-03 DIAGNOSIS — J329 Chronic sinusitis, unspecified: Secondary | ICD-10-CM | POA: Diagnosis not present

## 2022-04-03 DIAGNOSIS — I700 Atherosclerosis of aorta: Secondary | ICD-10-CM | POA: Diagnosis not present

## 2022-04-03 DIAGNOSIS — I10 Essential (primary) hypertension: Secondary | ICD-10-CM | POA: Diagnosis not present

## 2022-04-03 DIAGNOSIS — G43009 Migraine without aura, not intractable, without status migrainosus: Secondary | ICD-10-CM | POA: Diagnosis not present

## 2022-04-16 DIAGNOSIS — R21 Rash and other nonspecific skin eruption: Secondary | ICD-10-CM | POA: Diagnosis not present

## 2022-04-16 DIAGNOSIS — R202 Paresthesia of skin: Secondary | ICD-10-CM | POA: Diagnosis not present

## 2022-04-16 DIAGNOSIS — Z79899 Other long term (current) drug therapy: Secondary | ICD-10-CM | POA: Diagnosis not present

## 2022-04-16 DIAGNOSIS — I7300 Raynaud's syndrome without gangrene: Secondary | ICD-10-CM | POA: Diagnosis not present

## 2022-04-16 DIAGNOSIS — M1990 Unspecified osteoarthritis, unspecified site: Secondary | ICD-10-CM | POA: Diagnosis not present

## 2022-04-16 DIAGNOSIS — Q796 Ehlers-Danlos syndrome, unspecified: Secondary | ICD-10-CM | POA: Diagnosis not present

## 2022-04-16 DIAGNOSIS — M130 Polyarthritis, unspecified: Secondary | ICD-10-CM | POA: Diagnosis not present

## 2022-04-16 DIAGNOSIS — M359 Systemic involvement of connective tissue, unspecified: Secondary | ICD-10-CM | POA: Diagnosis not present

## 2022-04-19 DIAGNOSIS — G9600 Cerebrospinal fluid leak, unspecified: Secondary | ICD-10-CM | POA: Diagnosis not present

## 2022-04-19 DIAGNOSIS — M4802 Spinal stenosis, cervical region: Secondary | ICD-10-CM | POA: Diagnosis not present

## 2022-04-19 DIAGNOSIS — M4807 Spinal stenosis, lumbosacral region: Secondary | ICD-10-CM | POA: Diagnosis not present

## 2022-05-06 DIAGNOSIS — G932 Benign intracranial hypertension: Secondary | ICD-10-CM | POA: Diagnosis not present

## 2022-05-06 DIAGNOSIS — Z982 Presence of cerebrospinal fluid drainage device: Secondary | ICD-10-CM | POA: Diagnosis not present

## 2022-05-06 DIAGNOSIS — G919 Hydrocephalus, unspecified: Secondary | ICD-10-CM | POA: Diagnosis not present

## 2022-05-09 DIAGNOSIS — Z1231 Encounter for screening mammogram for malignant neoplasm of breast: Secondary | ICD-10-CM | POA: Diagnosis not present

## 2022-05-16 DIAGNOSIS — G43009 Migraine without aura, not intractable, without status migrainosus: Secondary | ICD-10-CM | POA: Diagnosis not present

## 2022-05-16 DIAGNOSIS — Z6824 Body mass index (BMI) 24.0-24.9, adult: Secondary | ICD-10-CM | POA: Diagnosis not present

## 2022-05-16 DIAGNOSIS — J329 Chronic sinusitis, unspecified: Secondary | ICD-10-CM | POA: Diagnosis not present

## 2022-05-28 ENCOUNTER — Other Ambulatory Visit: Payer: Self-pay | Admitting: Internal Medicine

## 2022-06-04 DIAGNOSIS — M4322 Fusion of spine, cervical region: Secondary | ICD-10-CM | POA: Diagnosis not present

## 2022-06-04 DIAGNOSIS — M4807 Spinal stenosis, lumbosacral region: Secondary | ICD-10-CM | POA: Diagnosis not present

## 2022-06-04 DIAGNOSIS — M4802 Spinal stenosis, cervical region: Secondary | ICD-10-CM | POA: Diagnosis not present

## 2022-06-04 DIAGNOSIS — Z9889 Other specified postprocedural states: Secondary | ICD-10-CM | POA: Diagnosis not present

## 2022-06-04 DIAGNOSIS — M4325 Fusion of spine, thoracolumbar region: Secondary | ICD-10-CM | POA: Diagnosis not present

## 2022-06-11 DIAGNOSIS — I10 Essential (primary) hypertension: Secondary | ICD-10-CM | POA: Diagnosis not present

## 2022-06-11 DIAGNOSIS — M545 Low back pain, unspecified: Secondary | ICD-10-CM | POA: Diagnosis not present

## 2022-06-13 IMAGING — CT CT RENAL STONE PROTOCOL
2 of 4 series · 16 of 46 positions shown, 18 images · non-contrast
Comparison: 08/02/2020

CLINICAL DATA: Mid left abdominal and flank pain.



[Series 2: stone full · axial · 0.76mm/px · z∈[-420,-56]mm · 13 of 81 slices shown, 15 images]
[im 4/81  soft-tissue]
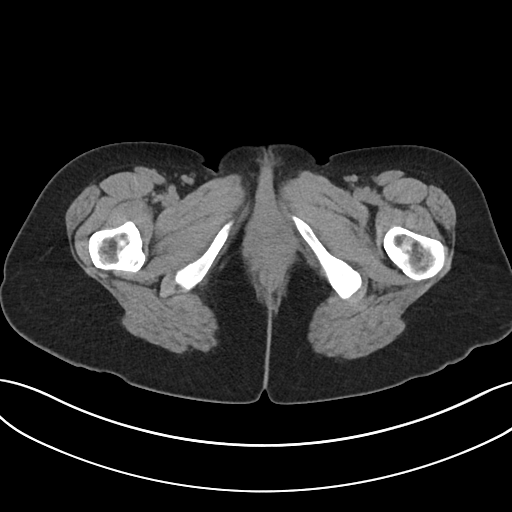
[im 4/81  bone]
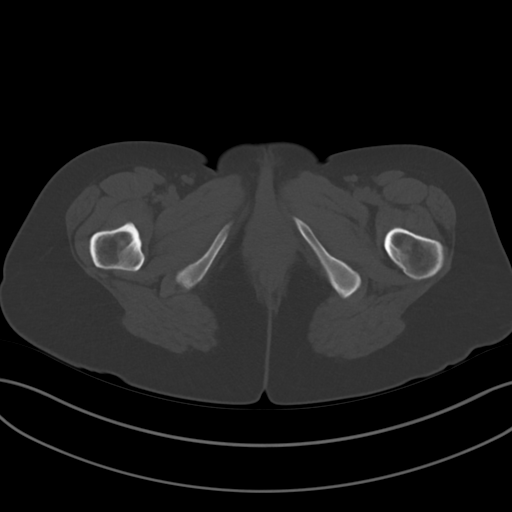
[im 10/81  soft-tissue]
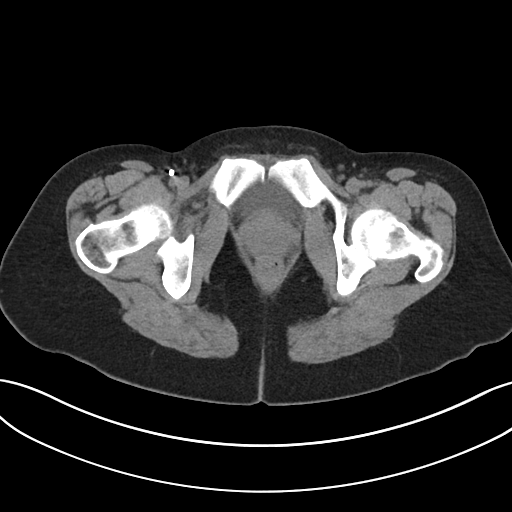
[im 16/81  soft-tissue]
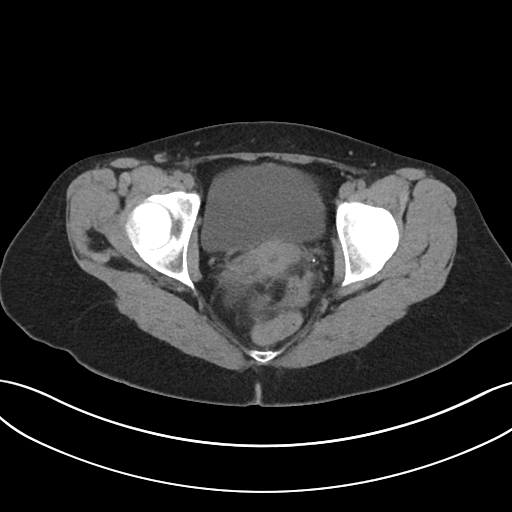
[im 22/81  soft-tissue]
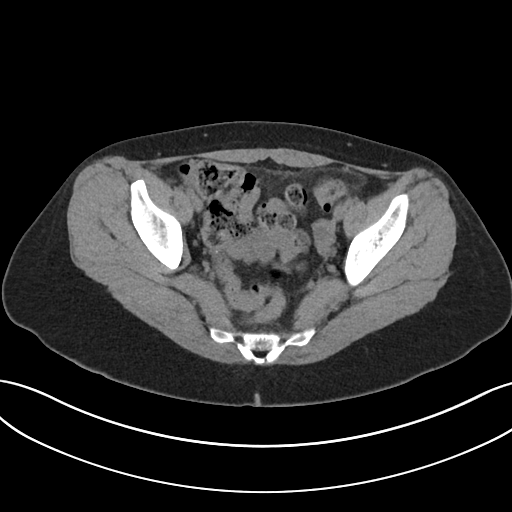
[im 28/81  soft-tissue]
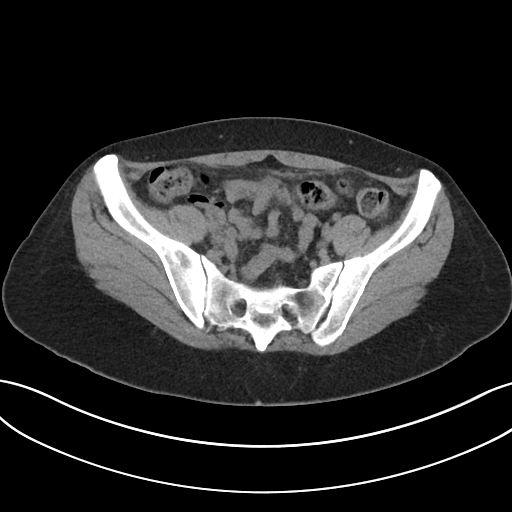
[im 34/81  soft-tissue]
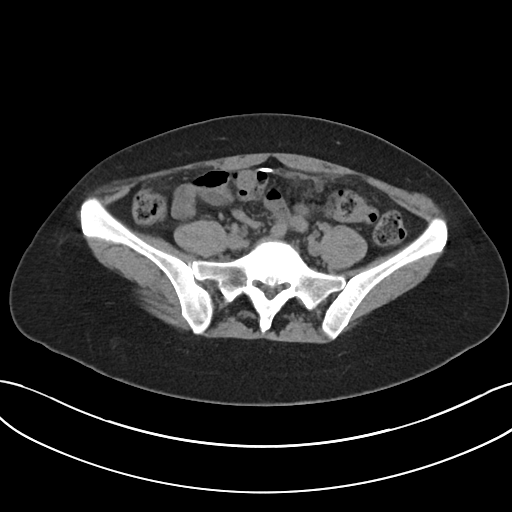
[im 41/81  soft-tissue]
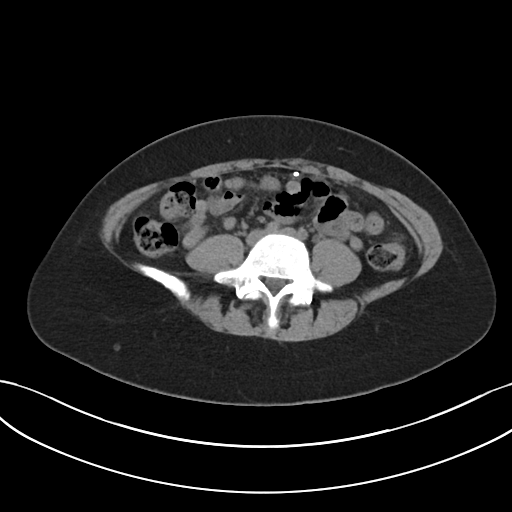
[im 47/81  soft-tissue]
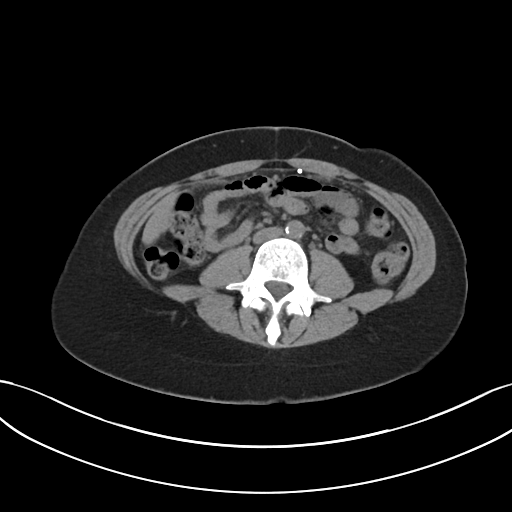
[im 53/81  soft-tissue]
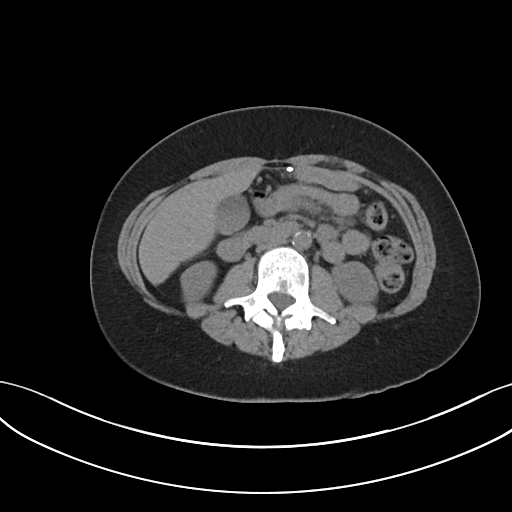
[im 53/81  bone]
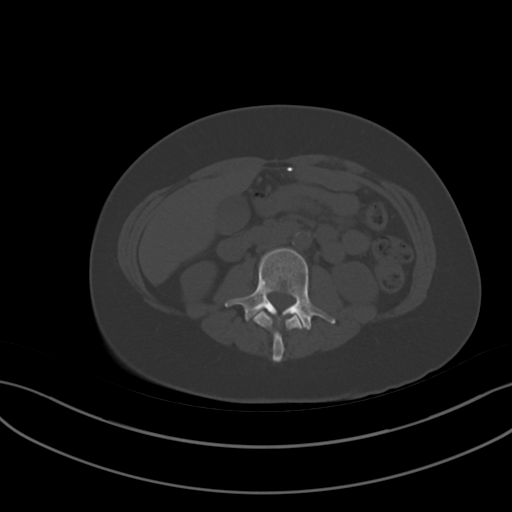
[im 59/81  soft-tissue]
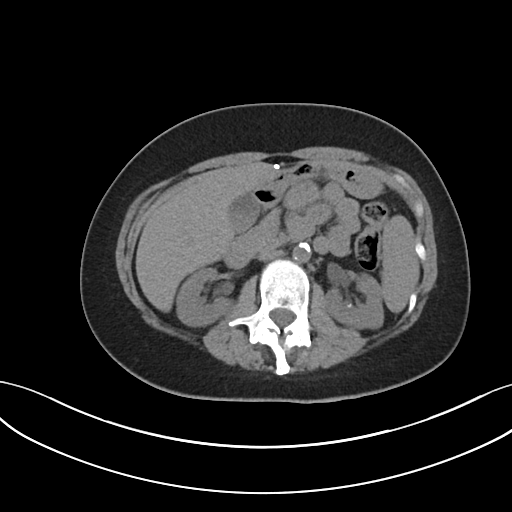
[im 65/81  soft-tissue]
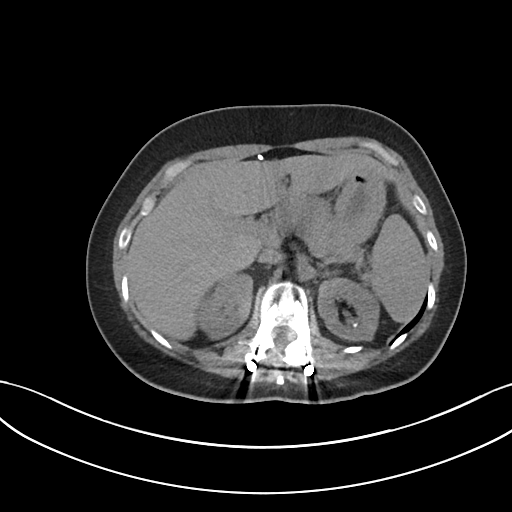
[im 71/81  soft-tissue]
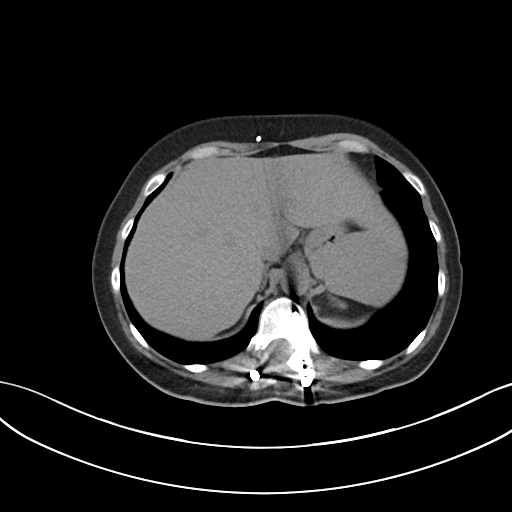
[im 77/81  soft-tissue]
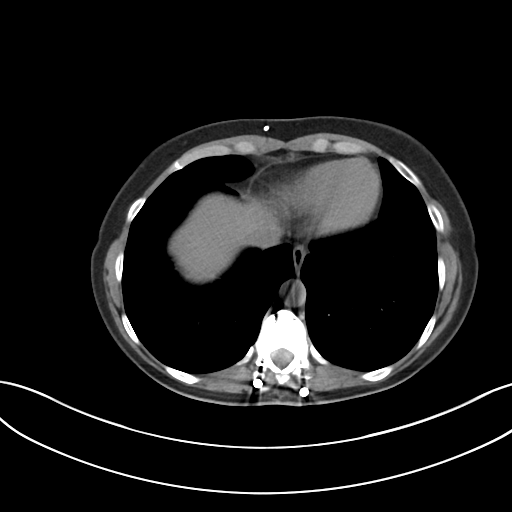

[Series 5: coronal (person_name) · coronal · 0.67mm/px · 3 of 74 slices shown]
[im 25/74  soft-tissue]
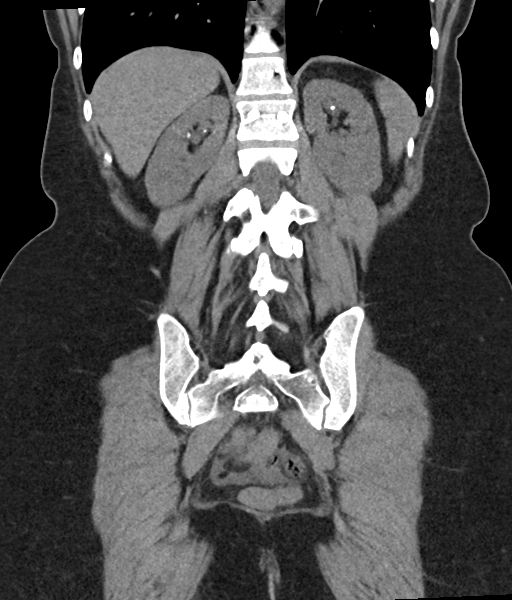
[im 33/74  soft-tissue]
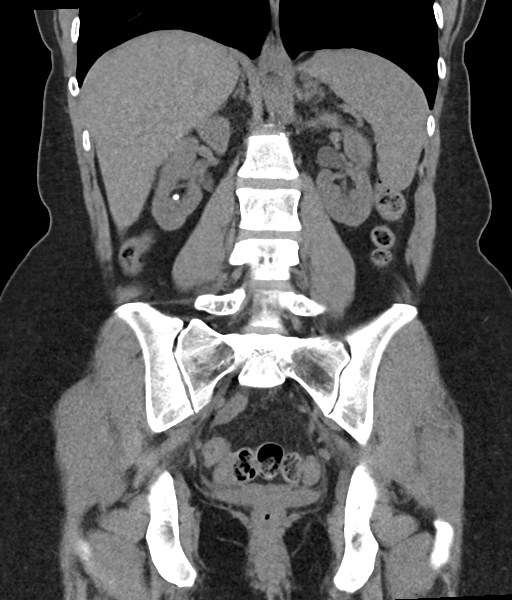
[im 41/74  soft-tissue]
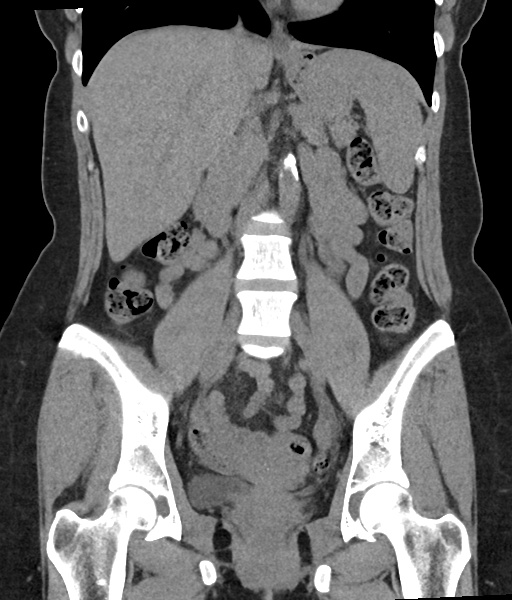

[16 of 46 positions shown; findings below may reference images not displayed]

FINDINGS: Lower chest: Unremarkable.

Hepatobiliary: No suspicious focal abnormality in the liver on this
study without intravenous contrast. There is no evidence for
gallstones, gallbladder wall thickening, or pericholecystic fluid.
No intrahepatic or extrahepatic biliary dilation.

Pancreas: No focal mass lesion. No dilatation of the main duct. No
intraparenchymal cyst. No peripancreatic edema.

Spleen: No splenomegaly. No focal mass lesion.

Adrenals/Urinary Tract: No adrenal nodule or mass. Approximately 4-5
nonobstructing stones are seen in the right kidney measuring up to 1
of the more dominant 5 mm stones in the lower pole. Approximately 5
stones are seen in the left kidney including dominant 5 mm upper
pole stone. No ureteral stones. No secondary changes in either
kidney or ureter. No bladder stones

Stomach/Bowel: Stomach is nondistended. Duodenum is normally
positioned as is the ligament of Treitz. No small bowel wall
thickening. No small bowel dilatation. Terminal ileum not well seen.
The appendix is not well visualized, but there is no edema or
inflammation in the region of the cecum. No gross colonic mass. No
colonic wall thickening.

Vascular/Lymphatic: There is mild atherosclerotic calcification of
the abdominal aorta without aneurysm. There is no gastrohepatic or
hepatoduodenal ligament lymphadenopathy. No retroperitoneal or
mesenteric lymphadenopathy. No pelvic sidewall lymphadenopathy.

Reproductive: The uterus is unremarkable.  There is no adnexal mass.

Other: No intraperitoneal free fluid.

Musculoskeletal: No worrisome lytic or sclerotic osseous
abnormality. Thoracolumbar fusion hardware evident. VP shunt tubing
noted.
IMPRESSION: Bilateral nonobstructing renal stones. No ureteral or bladder
stones. No secondary changes in either kidney or ureter.

Aortic Atherosclerosis ([id number]-CI8.8).

## 2022-07-11 DIAGNOSIS — R519 Headache, unspecified: Secondary | ICD-10-CM | POA: Diagnosis not present

## 2022-07-11 DIAGNOSIS — G44309 Post-traumatic headache, unspecified, not intractable: Secondary | ICD-10-CM | POA: Diagnosis not present

## 2022-07-11 DIAGNOSIS — Z982 Presence of cerebrospinal fluid drainage device: Secondary | ICD-10-CM | POA: Diagnosis not present

## 2022-07-23 ENCOUNTER — Telehealth: Payer: Self-pay | Admitting: Cardiovascular Disease

## 2022-07-23 DIAGNOSIS — I351 Nonrheumatic aortic (valve) insufficiency: Secondary | ICD-10-CM

## 2022-07-23 NOTE — Telephone Encounter (Signed)
Called Lorraine Weber- Order for Echo placed- Lorraine Weber will receive a call to get this scheduled. Scheduled a f/u visit with Dr Royann Shivers for 09/06/22 at 4:30  She verbalized understanding

## 2022-07-23 NOTE — Telephone Encounter (Signed)
Patient is calling to get order for her yearly echo. Requesting call back to schedule.

## 2022-07-30 ENCOUNTER — Other Ambulatory Visit: Payer: Self-pay | Admitting: Cardiovascular Disease

## 2022-08-01 DIAGNOSIS — R202 Paresthesia of skin: Secondary | ICD-10-CM | POA: Diagnosis not present

## 2022-08-02 DIAGNOSIS — G932 Benign intracranial hypertension: Secondary | ICD-10-CM | POA: Diagnosis not present

## 2022-08-07 ENCOUNTER — Other Ambulatory Visit: Payer: Self-pay | Admitting: Cardiovascular Disease

## 2022-08-11 DIAGNOSIS — I10 Essential (primary) hypertension: Secondary | ICD-10-CM | POA: Diagnosis not present

## 2022-08-11 DIAGNOSIS — J449 Chronic obstructive pulmonary disease, unspecified: Secondary | ICD-10-CM | POA: Diagnosis not present

## 2022-08-11 DIAGNOSIS — M159 Polyosteoarthritis, unspecified: Secondary | ICD-10-CM | POA: Diagnosis not present

## 2022-08-16 ENCOUNTER — Ambulatory Visit (HOSPITAL_COMMUNITY): Payer: Medicare Other | Attending: Cardiology

## 2022-08-16 DIAGNOSIS — I351 Nonrheumatic aortic (valve) insufficiency: Secondary | ICD-10-CM | POA: Insufficient documentation

## 2022-08-16 LAB — ECHOCARDIOGRAM COMPLETE
Area-P 1/2: 4.52 cm2
MV M vel: 6.11 m/s
MV Peak grad: 149.5 mmHg
P 1/2 time: 231 msec
S' Lateral: 3 cm

## 2022-09-02 ENCOUNTER — Ambulatory Visit: Payer: Medicare Other | Admitting: Cardiovascular Disease

## 2022-09-04 DIAGNOSIS — H5713 Ocular pain, bilateral: Secondary | ICD-10-CM | POA: Diagnosis not present

## 2022-09-05 ENCOUNTER — Telehealth: Payer: Self-pay | Admitting: *Deleted

## 2022-09-05 NOTE — Progress Notes (Signed)
  Care Coordination  Outreach Note  09/05/2022 Name: Lorraine Weber MRN: [id number] DOB: 18-Jun-1980   Care Coordination Outreach Attempts: An unsuccessful telephone outreach was attempted today to offer the patient information about available care coordination services.  Follow Up Plan:  Additional outreach attempts will be made to offer the patient care coordination information and services.   Encounter Outcome:  No Answer  Christie Nottingham  Care Coordination Care Guide  Direct Dial: [phone]

## 2022-09-05 NOTE — Progress Notes (Signed)
  Care Coordination   Note   09/05/2022 Name: Lorraine Weber MRN: [id number] DOB: [birth year]  Lorraine Weber is a 42 y.o. year old female who sees Elfredia Nevins, MD for primary care. I reached out to Lorraine Weber by phone today to offer care coordination services.  Lorraine Weber was given information about Care Coordination services today including:   The Care Coordination services include support from the care team which includes your Nurse Coordinator, Clinical Social Worker, or Pharmacist.  The Care Coordination team is here to help remove barriers to the health concerns and goals most important to you. Care Coordination services are voluntary, and the patient may decline or stop services at any time by request to their care team member.   Care Coordination Consent Status: Patient agreed to services and verbal consent obtained.   Follow up plan:  Telephone appointment with care coordination team member scheduled for:  09/24/22  Encounter Outcome:  Pt. Scheduled  [facility] Coordination Care Guide  Direct Dial: [phone]

## 2022-09-06 ENCOUNTER — Ambulatory Visit: Payer: Medicare Other | Attending: Cardiovascular Disease | Admitting: Cardiovascular Disease

## 2022-09-06 ENCOUNTER — Encounter: Payer: Self-pay | Admitting: Cardiovascular Disease

## 2022-09-06 VITALS — BP 114/66 | HR 97 | Ht 64.0 in | Wt 143.4 lb

## 2022-09-06 DIAGNOSIS — I7300 Raynaud's syndrome without gangrene: Secondary | ICD-10-CM | POA: Diagnosis not present

## 2022-09-06 DIAGNOSIS — R072 Precordial pain: Secondary | ICD-10-CM | POA: Diagnosis not present

## 2022-09-06 DIAGNOSIS — I5032 Chronic diastolic (congestive) heart failure: Secondary | ICD-10-CM | POA: Diagnosis not present

## 2022-09-06 DIAGNOSIS — M359 Systemic involvement of connective tissue, unspecified: Secondary | ICD-10-CM

## 2022-09-06 DIAGNOSIS — I340 Nonrheumatic mitral (valve) insufficiency: Secondary | ICD-10-CM | POA: Diagnosis not present

## 2022-09-06 DIAGNOSIS — I871 Compression of vein: Secondary | ICD-10-CM | POA: Diagnosis not present

## 2022-09-06 DIAGNOSIS — I700 Atherosclerosis of aorta: Secondary | ICD-10-CM

## 2022-09-06 DIAGNOSIS — I351 Nonrheumatic aortic (valve) insufficiency: Secondary | ICD-10-CM

## 2022-09-06 DIAGNOSIS — T82817D Embolism of cardiac prosthetic devices, implants and grafts, subsequent encounter: Secondary | ICD-10-CM | POA: Diagnosis not present

## 2022-09-06 DIAGNOSIS — I10 Essential (primary) hypertension: Secondary | ICD-10-CM

## 2022-09-06 DIAGNOSIS — Q796 Ehlers-Danlos syndrome, unspecified: Secondary | ICD-10-CM

## 2022-09-06 NOTE — Patient Instructions (Addendum)
Medication Instructions:  Stop Amlodipine *If you need a refill on your cardiac medications before your next appointment, please call your pharmacy*  Testing/Procedures: Your physician has requested that you have an echocardiogram in July before one year follow up appointment. Echocardiography is a painless test that uses sound waves to create images of your heart. It provides your doctor with information about the size and shape of your heart and how well your heart's chambers and valves are working. This procedure takes approximately one hour. There are no restrictions for this procedure. Please do NOT wear cologne, perfume, aftershave, or lotions (deodorant is allowed). Please arrive 15 minutes prior to your appointment time.    Follow-Up: At [facility], you and your health needs are our priority.  As part of our continuing mission to provide you with exceptional heart care, we have created designated Provider Care Teams.  These Care Teams include your primary Cardiologist (physician) and Advanced Practice Providers (APPs -  Physician Assistants and Nurse Practitioners) who all work together to provide you with the care you need, when you need it.  We recommend signing up for the patient portal called "MyChart".  Sign up information is provided on this After Visit Summary.  MyChart is used to connect with patients for Virtual Visits (Telemedicine).  Patients are able to view lab/test results, encounter notes, upcoming appointments, etc.  Non-urgent messages can be sent to your provider as well.   To learn more about what you can do with MyChart, go to [email or web address].    Your next appointment:   1 year(s)  Provider:   Thurmon Fair, MD

## 2022-09-06 NOTE — Progress Notes (Unsigned)
Cardiology Office Note   Date:  09/07/2022   ID:  Lorraine Weber, DOB 05-Mar-1980, MRN [id number]  PCP:  Elfredia Nevins, MD  Cardiologist:  Thurmon Fair, MD EP: None  No chief complaint on file.  History of Present Illness: Lorraine Weber is a 42 y.o. female with aortic insufficiency due to leaflet abnormalities, presumed Ehlers-Danlos syndrome with vascular manifestations (genetic work-up negative for common mutations), Von Willebrands disease,  poorly defined autoimmune disorder (ANA 1:320, + SSA), idiopathic peripheral neuropathy, intracranial hypertension, history of CSF fistula, history of seizures, status post ventriculoperitoneal shunt with multiple revisions, history of bilateral jugular vein stents, vein closure device embolism to the right ventricle, who presents for follow-up of mitral and aortic insufficiency.  She has headaches, but has no cardiovascular complaints.  She is physically active and denies problems with shortness of breath or chest pain with activity.  She has not been troubled by palpitations.  She has occasional dizziness that sounds positional.  She has not had syncope.  She also has ankle edema towards the end of the day, resolves after overnight recumbent position.  She has no problems with swallowing currently.  She has not had any recent seizures.  She is able to walk the dogs and climb the stairs in her home multiple times a day without shortness of breath.  Stamina has not changed over the last year.  She continues to have a dull nonspecific discomfort to the left of her sternum that is "always there" and does not worsen with physical activity.  Her most recent echocardiogram 08/16/2022 continue to show a nondilated left ventricle with LVEF greater than 60%.  On my evaluation there is moderate mitral regurgitation that is worse than the previous echocardiogram but is clearly not severe (mitral inflow E/A < 1).  There is at least moderate aortic  insufficiency.  The pressure half-time is somewhere between 270 and 300 ms and there is no holodiastolic reversal of flow in the descending aorta.  On her previous TEE the aortic valve showed changes suggestive of healed inflammatory disorder such as bacterial or noninfective endocarditis.  Transesophageal echocardiography in April 2022 showed no abnormalities of the aortic root, which showed unusual changes of the aortic valve cusps, suggestive of healed or noninfective endocarditis.  The appearance was vaguely suggestive of Libman-Sacks endocarditis so we sent off screening tests for autoimmune disease.  ANA was positive with a striking increase in SSA (Ro) antibodies, but the ESR was only 1 suggesting that there is no active ongoing inflammation.  Anticardiolipin antibodies were negative.  Hgb and platelet count normal  She had another transthoracic echocardiogram at [facility] on 07/03/2020 which described her aortic insufficiency as moderate.  And another CT angiogram of the aorta on 07/10/2020.  This did not change the diagnostic findings.  Finally she had a 3-day event monitor that showed rare PVCs and PACs with 2 episodes of nonsustained atrial tachycardia with a maximum of 16 beats.  Emergency work-up in January 2022 showed normal cardiac troponins, normal chest x-ray, no evidence of pulmonary embolism on CT angiography.  There was also no evidence of aortic aneurysm or dissection. She was noted to have age advanced atheromatous plaque of the aorta and proximal left subclavian on CT scan 02/18/2020.  The same surprising finding was noted on her TEE.  She does have a history of severe illness with Staph aureus bacteremia in 2006.  She had a PICC line and received antibiotics intravenously for several weeks.  She  underwent left internal jugular vein stenting 10/2019 for management of stenosis causing elevated intracerebral pressure (bilateral stenosis in turn occurring close to the level of previous cervical  spine surgery). Shortly thereafter she was found to have embolization of a Celt closure device to the right ventricle. She underwent a Coronary CTA which showed no calcifications in the coronary arteries and a metallic device in the right ventricle, trapped by the trabeculation of the right ventricle. No further intervention or work-up was recommended at that time. She subsequently underwent right internal jugular vein stenting 01/2020.  Before undergoing her TEE, she first had to undergo esophagoplasty due to dysphagia and esophageal stenosis (04/18/2020).    Negative COL5A1, COL5A2, and COL1A1, autosomal dominant disorders associated with the classical Ehlers Danlos syndrome types 1 and 2. 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   Macular rash 09/25/2020   Mastoiditis    Migraines    Nephrolithiasis    X5, Dr Annabell Howells, passed stones, no surgery   Peptic ulcer disease    PONV (postoperative nausea and vomiting)    Septic joint of left wrist (HCC) 09/25/2020   Von Willebrand disease (HCC)    mild form per patient    Past Surgical History:  Procedure Laterality Date   APPENDECTOMY     cerebral stent Bilateral    jugular vein   CERVICAL FUSION  2018   C5-C7   DILITATION & CURRETTAGE/HYSTROSCOPY WITH NOVASURE ABLATION N/A 08/23/2015   Procedure: DILATATION & CURETTAGE/HYSTEROSCOPY WITH NOVASURE ABLATION;  Surgeon: Waynard Reeds, MD;  Location: [facility];   Service: Gynecology;  Laterality: N/A;   EXTRACORPOREAL SHOCK WAVE LITHOTRIPSY Left 03/19/2018   Procedure: EXTRACORPOREAL SHOCK WAVE LITHOTRIPSY (ESWL);  Surgeon: Alfredo Martinez, MD;  Location: [facility];  Service: Urology;  Laterality: Left;   HAND TENDON SURGERY Left    left, complicated by MRSA   LAMINECTOMY  21016   T10-T12   SHUNT REMOVAL  05/2021   spinal fluid leak patching     > 10X;[facility] - blood patching   SPINAL FUSION  2017   T10-T11   SPINAL FUSION  2018   T10-L1   TEE WITHOUT CARDIOVERSION N/A 05/17/2020   Procedure: TRANSESOPHAGEAL ECHOCARDIOGRAM (TEE);  Surgeon: Thurmon Fair, MD;  Location: [facility];  Service: Cardiovascular;  Laterality: N/A;   VENTRICULOPERITONEAL SHUNT  12/2020   WISDOM TOOTH EXTRACTION       Current Outpatient Medications  Medication Sig Dispense Refill   cyanocobalamin (,VITAMIN B-12,) 1000 MCG/ML injection Inject 1,000 mcg into the muscle every 30 (thirty) days.     famotidine (PEPCID) 40 MG tablet TAKE ONE TABLET BY MOUTH TWICE DAILY 60 tablet 3   fluticasone (FLONASE) 50 MCG/ACT nasal spray Place 1 spray into both nostrils daily.     furosemide (LASIX) 40 MG tablet Take 1 tablet (40 mg total) by mouth daily. 30 tablet 0  gabapentin (NEURONTIN) 300 MG capsule Take 300 mg by mouth 3 (three) times daily.     hydroxychloroquine (PLAQUENIL) 200 MG tablet Take 200 mg by mouth daily. Patient takes 1 tablet one day then 2 tablets the next day, alternating     losartan (COZAAR) 100 MG tablet Take 1 tablet (100 mg total) by mouth daily. 30 tablet 0   methocarbamol (ROBAXIN) 750 MG tablet Take 750 mg by mouth every 6 (six) hours as needed for muscle spasms.     metoprolol succinate (TOPROL-XL) 50 MG 24 hr tablet Take 1 tablet (50 mg total) by mouth daily. Take with or immediately following a meal. (Patient taking differently: Take 50 mg by mouth at bedtime. Take with or immediately following a meal.) 90 tablet 3   nortriptyline (PAMELOR) 50 MG  capsule Take 50 mg by mouth at bedtime.     omeprazole (PRILOSEC) 40 MG capsule TAKE ONE CAPSULE BY MOUTH TWICE DAILY 180 capsule 2   ondansetron (ZOFRAN) 4 MG tablet Take 4 mg by mouth every 8 (eight) hours as needed for vomiting or nausea.     tiZANidine (ZANAFLEX) 2 MG tablet Take 2 mg by mouth every 6 (six) hours as needed for muscle spasms.     traMADol (ULTRAM) 50 MG tablet Take 50-100 mg by mouth 4 (four) times daily as needed for moderate pain.  2   trifluoperazine (STELAZINE) 1 MG tablet Take 1 mg by mouth at bedtime.  Take 2 tablets     Vitamin D, Ergocalciferol, (DRISDOL) 1.25 MG (50000 UNIT) CAPS capsule Take 50,000 Units by mouth once a week.     nortriptyline (PAMELOR) 50 MG capsule Take 50 mg by mouth 2 (two) times daily. (Patient not taking: Reported on 09/06/2022)     topiramate (TOPAMAX) 25 MG tablet Take 25 mg by mouth 3 (three) times daily.     No current facility-administered medications for this visit.    Allergies:   Acetazolamide, Other, Vancomycin, Levofloxacin, Levofloxacin in d5w, and Sulfamethoxazole-trimethoprim    Social History:  The patient  reports that she has quit smoking. Her smoking use included cigarettes. She has a 3 pack-year smoking history. She has never used smokeless tobacco. She reports that she does not drink alcohol and does not use drugs.   Family History:  The patient's family history includes Arthritis in her father; Diabetes in her father, maternal grandmother, and paternal grandfather; Heart disease in her brother, maternal aunt, maternal grandfather, maternal grandmother, maternal uncle, and mother; Hyperlipidemia in her father and paternal grandfather; Kidney disease in her father; Other in an other family member; Prostate cancer in her paternal grandfather; Stroke in her father; Stroke (age of onset: 99) in her maternal aunt.    ROS:  Please see the history of present illness.   Otherwise, review of systems are positive for none.   All other  systems are reviewed and negative.    PHYSICAL EXAM: VS:  BP 114/66 (BP Location: Left Arm, Patient Position: Sitting, Cuff Size: Normal)   Pulse 97   Ht 5\' 4"  (1.626 m)   Wt 143 lb 6.4 oz (65 kg)   SpO2 98%   BMI 24.61 kg/m  , BMI Body mass index is 24.61 kg/m.   General: Alert, oriented x3, no distress, very lean. Head: no evidence of trauma, PERRL, EOMI, no exophtalmos or lid lag, no myxedema, no xanthelasma; normal ears, nose and oropharynx Neck: normal jugular venous pulsations and no hepatojugular reflux; brisk carotid pulses without delay and  no carotid bruits Chest: clear to auscultation, no signs of consolidation by percussion or palpation, normal fremitus, symmetrical and full respiratory excursions Cardiovascular: normal position and quality of the apical impulse, regular rhythm, normal first and second heart sounds.  There may be a systolic ejection click.  At the right lower sternal border there is a faint 1-2/6 decrescendo diastolic murmur of aortic insufficiency.  At the apex there is a barely audible holosystolic murmur.  On peripheral vascular exam there are no signs of severe aortic insufficiency. Abdomen: no tenderness or distention, no masses by palpation, no abnormal pulsatility or arterial bruits, normal bowel sounds, no hepatosplenomegaly Extremities: no clubbing, cyanosis or edema; 2+ radial, ulnar and brachial pulses bilaterally; 2+ right femoral, posterior tibial and dorsalis pedis pulses; 2+ left femoral, posterior tibial and dorsalis pedis pulses; no subclavian or femoral bruits Neurological: grossly nonfocal Psych: Normal mood and affect     EKG:  EKG is ordered today.  It is very similar to previous tracings.  It shows sinus rhythm and questionable left atrial abnormality.  There are no ischemic repolarization abnormalities.  QTc is borderline at 474 ms.  Recent Labs: No results found for requested labs within last 365 days.  04/16/2022 hemoglobin 11.6,  WBC 4.4, platelets 301 K, creatinine 0.82  Lipid Panel    Component Value Date/Time   CHOL 129 03/06/2022 0933   TRIG 77 03/06/2022 0933   HDL 59 03/06/2022 0933   CHOLHDL 2.2 03/06/2022 0933   LDLCALC 55 03/06/2022 0933      Wt Readings from Last 3 Encounters:  09/06/22 143 lb 6.4 oz (65 kg)  08/28/21 138 lb (62.6 kg)  08/09/21 138 lb (62.6 kg)      Other studies Reviewed: Additional studies/ records that were reviewed today include:  [facility] CTA Aorta 07/10/2020 1. No evidence of acute aortic syndrome or aneurysm. There are no findings to suggest aortic thrombus.   2. Tricuspid aortic valve with leaflet thickening but no appreciable calcifications.   3. Approximately 50% stenosis of the proximal left subclavian artery by mixed plaque.   4. ACDF and T11-L1 lumbar fixation hardware are intact.   [facility] echocardiogram 07/03/2020  NORMAL LEFT VENTRICULAR SYSTOLIC FUNCTION    NORMAL LA PRESSURES WITH NORMAL DIASTOLIC FUNCTION    NORMAL RIGHT VENTRICULAR SYSTOLIC FUNCTION    VALVULAR REGURGITATION: TRIVIAL AR, MILD MR, MILD TR    NO VALVULAR STENOSIS    TACHYCARDIA THROUGHOUT IMAGING    NO VALVULAR VEGETATION NOT SEEN ON TODAY'S EXAM.    TRIVIAL AORTIC REGURGITATION NOTED ON TODAY'S EXAM WITH PHT = 653 ms.    NO PRIOR STUDY FOR COMPARISON   [facility] 3-day event monitor 09/24/2020 11:53 AM EDT    *The observed rhythms are sinus rhythm to sinus tachycardia. *The Maximum Heart Rate recorded was 146 bpm, Day 4 / 12:56:35 pm, the Minimum Heart Rate recorded was 64 bpm, Day 3 / 06:16:14 am and the Average Heart Rate was 94 bpm. *There were 13 PVCs  with a burden of < 0.01 %. *There were 20 PSVCs with a burden of < 0.01 %. There were 2 occurrences of Supraventricular Tachycardia with the longest episode 16 beats, Day 4 / 03:28:41 am and the fastest episode 123 bpm, Day 2 / 04:38:10 am. *There were  22 Patient reported events.  Echocardiogram 08/16/2022   1. Left ventricular ejection  fraction, by estimation, is 60 to 65%. The  left ventricle has normal function. The left ventricle has no regional  wall  motion abnormalities. Left ventricular diastolic parameters were  normal.   2. Right ventricular systolic function is normal. The right ventricular  size is normal.   3. Left atrial size was mildly dilated.   4. The mitral valve is myxomatous. Moderate to severe mitral valve  regurgitation. No evidence of mitral stenosis.   5. Tricuspid valve regurgitation is moderate.   6. The aortic valve is abnormal. There is moderate thickening of the  aortic valve. Aortic valve regurgitation is severe. No aortic stenosis is  present. Aortic regurgitation PHT measures 231 msec.   7. The inferior vena cava is normal in size with greater than 50%  respiratory variability, suggesting right atrial pressure of 3 mmHg.   Coronary CTA 01/2020: 1. No calcifications seen in the coronary arteries.  2. There is a metallic device in the right ventricle, trapped by the trabeculation of the right ventricle. Clinical correlation advised. Ordering physician notified.  TEE 05/17/2020   1. Left ventricular ejection fraction, by estimation, is 60 to 65%. The  left ventricle has normal function. Left ventricular diastolic parameters  were normal.   2. Right ventricular systolic function is normal. The right ventricular  size is normal. There is normal pulmonary artery systolic pressure. The  estimated right ventricular systolic pressure is 20.3 mmHg.   3. Left atrial size was mild to moderately dilated. No left atrial/left  atrial appendage thrombus was detected.   4. The mitral valve is myxomatous. Trivial mitral valve regurgitation.   5. The tricuspid valve is myxomatous.   6. There are fixed sessile, verrucous appearing echodensities on the  margins of all three aortic valve leaflets, particularly prominent on the  right and left cusps. They impair leaflet coaptation allowing for a  central  regurgitant orifice.. The aortic  valve is tricuspid. There is moderate thickening of the aortic valve.  Aortic valve regurgitation is moderate to severe. No aortic stenosis is  present. Aortic regurgitation PHT measures 296 msec.   7. There is mild (Grade II) layered plaque involving the descending  aorta.   Conclusion(s)/Recommendation(s): The cause of the aortic valve changes is  unclear. Consider Libman-Sacks endocarditis or late changes of healed  bacterial endocarditis.   LEFT VENTRICLE  LVIDd:         4.19 cm  LVIDs:         2.89 cm      AORTIC VALVE  AI PHT:            296 msec  AR Vena Contracta: 0.43 cm     AORTA  Ao Root diam: 2.83 cm  Ao STJ diam:  2.7 cm  Ao Asc diam:  2.90 cm  Ao Arch diam: 1.9 cm  Ao Desc diam: 2.00 cm   ASSESSMENT AND PLAN:  1. Chronic diastolic heart failure (HCC)   2. Nonrheumatic aortic valve insufficiency   3. Mitral valve insufficiency, unspecified etiology   4. Precordial pain   5. Embolism involving cardiac device, subsequent encounter   6. Essential hypertension   7. Jugular vein stenosis   8. EDS (Ehlers-Danlos syndrome)   9. Raynaud's phenomenon without gangrene   10. Undifferentiated connective tissue disease (HCC)   11. Aortic atherosclerosis (HCC)       CHF: Presumptive diagnosis based on improvement with diuretics in the past, but BNP was normal.  Currently euvolemic except for mild ankle swelling which can be attributed to amlodipine.  NYHA functional class I.  Avoid excessive doses of beta-blocker since bradycardia could be disadvantageous  with aortic insufficiency. AI: mechanism appears to be postinflammatory (sequela of endocarditis that occurred 15 years ago versus Libman-Sacks).  On my review of the echocardiogram the AI remains moderate-severe.  There is no holodiastolic reversal of flow in the descending aorta.  Multiple measurements of the aortic insufficiency pressure half-time were performed but I think the 1 in  the report is an accurate.  Pressure half-time appears to be no worse than 270 ms on my review.  The left ventricle is not dilated and has normal systolic function. MR: This is worse compared with previous studies and the mechanism is not clear.  The mitral leaflets are thickened therefore often described as myxomatous, but there is clearly no mitral valve prolapse.  Consider valve injury due to the jet of aortic insufficiency or progression of similar injury that caused the aortic insufficiency (postinflammatory changes, Libman-Sacks endocarditis?).  PA pressure slightly elevated in and the left atrium appears to be mildly dilated.  Monitor with yearly echo, but at this point no indication that she needs a valve replacement.  Unfortunately, it is unlikely that she will be able to have valve repair for equal aortic or mitral valve and she would need mechanical prostheses. Chest pain: Longstanding atypical complaint.  Workup in the past has not shown evidence of abnormalities of the aorta, pulmonary embolism, coronary insufficiency.   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 No evidence of migration on CT chest 02/18/20.  Unlikely that any of her symptoms are due to this and suspect that by this point the device is fully endothelialized. HTN: well controlled.  Has some lower extremity edema that is possibly attributable to amlodipine.  She is also on ARB/beta-blocker/diuretic.  Will try to see if her blood pressure remains in desirable range (less than 130/80) after restarting amlodipine. 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 Followed by neurosurgery outpatient. S/P removal of VP shunt due to infection, subsequent replacement.  Most recent visit with neurology in June of this year. Ehlers-Danlos Syndrome: Unclear if this is an accurate diagnosis.  Reportedly mother has EDS with vascular  component as well ; genetic testing has been negative for common variants. Raynaud's sd: currently quiescent, possibly could recur after having the amlodipine.  Positive ANA/anti-Ro: seeing Dr. Letta Pate. ANA 1:320, homogeneous. Smith, RNP, dsDNA, RF and anti CCP negative. Normal recent complement levels. Negative anticardiolipin antibodies.  She tells me that the possible diagnosis is mixed connective tissue disease.  Joint symptoms have improved on hydroxychloroquine.  Note borderline prolonged QT interval, probably attributable to this medication. Aortic atherosclerosis: Incidentally discovered on imaging studies and surprisingly advanced for age, but normal caliber of the aorta and no clinical PAD.  Lipid parameters are excellent without medications.     Current medicines are reviewed at length with the patient today.  The patient does not have concerns regarding medicines.   Labs/ tests ordered today include:   Orders Placed This Encounter  Procedures   EKG 12-Lead   ECHOCARDIOGRAM COMPLETE    Patient Instructions  Medication Instructions:  Stop Amlodipine *If you need a refill on your cardiac medications before your next appointment, please call your pharmacy*  Testing/Procedures: Your physician has requested that you have an echocardiogram in July before one year follow up appointment. Echocardiography is a painless test that uses sound waves to create images of your heart. It provides your doctor with information about the size and shape  of your heart and how well your heart's chambers and valves are working. This procedure takes approximately one hour. There are no restrictions for this procedure. Please do NOT wear cologne, perfume, aftershave, or lotions (deodorant is allowed). Please arrive 15 minutes prior to your appointment time.    Follow-Up: At [facility], you and your health needs are our priority.  As part of our continuing mission to provide you with  exceptional heart care, we have created designated Provider Care Teams.  These Care Teams include your primary Cardiologist (physician) and Advanced Practice Providers (APPs -  Physician Assistants and Nurse Practitioners) who all work together to provide you with the care you need, when you need it.  We recommend signing up for the patient portal called "MyChart".  Sign up information is provided on this After Visit Summary.  MyChart is used to connect with patients for Virtual Visits (Telemedicine).  Patients are able to view lab/test results, encounter notes, upcoming appointments, etc.  Non-urgent messages can be sent to your provider as well.   To learn more about what you can do with MyChart, go to [email or web address].    Your next appointment:   1 year(s)  Provider:   Thurmon Fair, MD       Signed, Thurmon Fair, MD  09/07/2022 10:16 AM

## 2022-09-07 ENCOUNTER — Encounter: Payer: Self-pay | Admitting: Cardiovascular Disease

## 2022-09-07 DIAGNOSIS — I340 Nonrheumatic mitral (valve) insufficiency: Secondary | ICD-10-CM | POA: Insufficient documentation

## 2022-09-07 DIAGNOSIS — M359 Systemic involvement of connective tissue, unspecified: Secondary | ICD-10-CM | POA: Insufficient documentation

## 2022-09-17 DIAGNOSIS — G43719 Chronic migraine without aura, intractable, without status migrainosus: Secondary | ICD-10-CM | POA: Diagnosis not present

## 2022-09-17 DIAGNOSIS — G4459 Other complicated headache syndrome: Secondary | ICD-10-CM | POA: Diagnosis not present

## 2022-09-24 ENCOUNTER — Encounter: Payer: Self-pay | Admitting: *Deleted

## 2022-09-24 ENCOUNTER — Ambulatory Visit: Payer: Self-pay | Admitting: *Deleted

## 2022-09-24 NOTE — Patient Outreach (Signed)
Care Coordination   Initial Visit Note   09/24/2022 Name: SIMONE HINDI MRN: [id number] DOB: [birth year]  [name] is a 42 y.o. year old female who sees Elfredia Nevins, MD for primary care. I spoke with  [name] by phone today. Patient has lower extremity edema that did not improve after stopping amlodipine. She is taking furosemide as prescribed. She does not wear compression hose/socks. Swelling improve some overnight but does not completely resolve. She props up to sleep at a 45 degree angle at night. Sometimes wakes up short of breath and has heart palpitations. Has has get up a few times to urinate. She would like a second opinion from a cardiologist at [facility] regarding her echocardiogram results and plan of care. She has that scheduled for 10/29/22. She also sees a neurologist at [facility] a rheumatologist at [facility]. She has support from her husband who also provides transportation to appointments.    What matters to the patients health and wellness today?  Having a 2nd opinion by cardiologist at [facility] regarding echocardiogram results    Goals Addressed             This Visit's Progress    Care Coordination Services       Care Coordination Goals: Patient will take medications as prescribed Patient will work with [facility] office regarding patient assistance for Nurtec and for insurance approval for [facility] Patient will keep all medical appointments Patient will keep Appt with [facility] on 10/29/22 for a second opinion of her recent echocardiogram results Patient will reach out to providers with any new or worsening symptoms Patient will continue to follow a low sodium diet Patient will try compression socks for lower extremity edema Patient will try heel raising exercises when seated to encourage a decrease in lower extremity edema Patient will reach out to RN Care Coordinator at [phone] with any resource or care coordination needs        SDOH  assessments and interventions completed:  Yes  SDOH Interventions Today    Flowsheet Row Most Recent Value  SDOH Interventions   Food Insecurity Interventions Intervention Not Indicated  Housing Interventions Intervention Not Indicated  Transportation Interventions Patient Resources (Friends/Family)  Utilities Interventions Intervention Not Indicated  Financial Strain Interventions Intervention Not Indicated        Care Coordination Interventions:  Yes, provided  Interventions Today    Flowsheet Row Most Recent Value  Chronic Disease   Chronic disease during today's visit Congestive Heart Failure (CHF), Other  [Aortic valve insufficiency, intracraniel hypertension (shunt placement), migraine headaches, Lorinda Creed Syndrome]  General Interventions   General Interventions Discussed/Reviewed General Interventions Discussed, General Interventions Reviewed, Durable Medical Equipment (DME), Doctor Visits  Doctor Visits Discussed/Reviewed Doctor Visits Discussed, Doctor Visits Reviewed, Specialist  [reviewed and discussed recent cardiology and neurology office visits and echocardiogram results. Patient would like a 2nd opinion on echo results from cardiologist at [facility]. Pt questioned if this is ok. Assured patient that a 2nd opinion is ok.]  Durable Medical Equipment (DME) Dan Humphreys, Shower bench, Other, BP Cuff  [Cane, walk-in shower]  PCP/Specialist Visits Compliance with follow-up visit  [10/24/22 Rheumatology at [facility], 10/29/22 Cardiology at [facility], 03/10/23 neurology at [facility]]  Exercise Interventions   Exercise Discussed/Reviewed Physical Activity, Exercise Discussed, Exercise Reviewed, Assistive device use and maintanence  [Patient is active around her home and is able to perform ADLs. Uses cane for balance as needed. Has walker but only uses after surgery. Encouraged increased activity as tolerated  and as advised by her specialists.]  Physical Activity Discussed/Reviewed Physical Activity  Discussed, Physical Activity Reviewed  Education Interventions   Education Provided Provided Education  Provided Verbal Education On Nutrition, When to see the doctor, Medication, Other  [Recently lost Medicare Extra Help Benefit due to incr. in husband's income. Discussed prescription assistance for namebrand medications through manufacturer. No assistance for generic. Prop feet up when sitting. Recommended compression socks for swelling]  Nutrition Interventions   Nutrition Discussed/Reviewed Nutrition Discussed, Nutrition Reviewed, Fluid intake, Decreasing salt  [follows a low sodium diet due to intracranial hypertension & CHF]  Pharmacy Interventions   Pharmacy Dicussed/Reviewed Pharmacy Topics Discussed, Pharmacy Topics Reviewed, Medications and their functions  [Takes medications regularly. Cost has gone up since losing Medicare Extra Help, but she has been able to buy them.]  Safety Interventions   Safety Discussed/Reviewed Safety Discussed, Safety Reviewed       Follow up plan: Follow up call scheduled for 11/05/22    Encounter Outcome:  Pt. Visit Completed   Demetrios Loll, BSN, RN-BC RN Care Coordinator [place]  [facility] Direct Dial: [phone] Main #: [phone]

## 2022-09-26 ENCOUNTER — Other Ambulatory Visit: Payer: Self-pay | Admitting: Internal Medicine

## 2022-09-30 DIAGNOSIS — N23 Unspecified renal colic: Secondary | ICD-10-CM | POA: Diagnosis not present

## 2022-09-30 DIAGNOSIS — M545 Low back pain, unspecified: Secondary | ICD-10-CM | POA: Diagnosis not present

## 2022-09-30 DIAGNOSIS — I10 Essential (primary) hypertension: Secondary | ICD-10-CM | POA: Diagnosis not present

## 2022-09-30 DIAGNOSIS — I700 Atherosclerosis of aorta: Secondary | ICD-10-CM | POA: Diagnosis not present

## 2022-09-30 DIAGNOSIS — R739 Hyperglycemia, unspecified: Secondary | ICD-10-CM | POA: Diagnosis not present

## 2022-09-30 DIAGNOSIS — E559 Vitamin D deficiency, unspecified: Secondary | ICD-10-CM | POA: Diagnosis not present

## 2022-09-30 DIAGNOSIS — J449 Chronic obstructive pulmonary disease, unspecified: Secondary | ICD-10-CM | POA: Diagnosis not present

## 2022-09-30 DIAGNOSIS — Q796 Ehlers-Danlos syndrome, unspecified: Secondary | ICD-10-CM | POA: Diagnosis not present

## 2022-09-30 DIAGNOSIS — Z6824 Body mass index (BMI) 24.0-24.9, adult: Secondary | ICD-10-CM | POA: Diagnosis not present

## 2022-09-30 DIAGNOSIS — G43009 Migraine without aura, not intractable, without status migrainosus: Secondary | ICD-10-CM | POA: Diagnosis not present

## 2022-09-30 DIAGNOSIS — M3211 Endocarditis in systemic lupus erythematosus: Secondary | ICD-10-CM | POA: Diagnosis not present

## 2022-09-30 DIAGNOSIS — Z0001 Encounter for general adult medical examination with abnormal findings: Secondary | ICD-10-CM | POA: Diagnosis not present

## 2022-09-30 DIAGNOSIS — M159 Polyosteoarthritis, unspecified: Secondary | ICD-10-CM | POA: Diagnosis not present

## 2022-09-30 DIAGNOSIS — I7410 Embolism and thrombosis of unspecified parts of aorta: Secondary | ICD-10-CM | POA: Diagnosis not present

## 2022-10-01 ENCOUNTER — Other Ambulatory Visit (HOSPITAL_COMMUNITY): Payer: Self-pay | Admitting: Internal Medicine

## 2022-10-01 DIAGNOSIS — N23 Unspecified renal colic: Secondary | ICD-10-CM

## 2022-10-06 ENCOUNTER — Ambulatory Visit (HOSPITAL_BASED_OUTPATIENT_CLINIC_OR_DEPARTMENT_OTHER)
Admission: RE | Admit: 2022-10-06 | Discharge: 2022-10-06 | Disposition: A | Discharge status: Home / Self Care | Payer: Medicare Other | Source: Ambulatory Visit | Attending: Internal Medicine | Admitting: Internal Medicine

## 2022-10-06 DIAGNOSIS — N200 Calculus of kidney: Secondary | ICD-10-CM | POA: Insufficient documentation

## 2022-10-06 DIAGNOSIS — N23 Unspecified renal colic: Secondary | ICD-10-CM | POA: Diagnosis not present

## 2022-10-06 DIAGNOSIS — I700 Atherosclerosis of aorta: Secondary | ICD-10-CM | POA: Insufficient documentation

## 2022-10-12 DIAGNOSIS — I10 Essential (primary) hypertension: Secondary | ICD-10-CM | POA: Diagnosis not present

## 2022-10-12 DIAGNOSIS — G43009 Migraine without aura, not intractable, without status migrainosus: Secondary | ICD-10-CM | POA: Diagnosis not present

## 2022-10-12 DIAGNOSIS — G894 Chronic pain syndrome: Secondary | ICD-10-CM | POA: Diagnosis not present

## 2022-10-17 ENCOUNTER — Other Ambulatory Visit: Payer: Self-pay | Admitting: Cardiovascular Disease

## 2022-10-19 DIAGNOSIS — J0101 Acute recurrent maxillary sinusitis: Secondary | ICD-10-CM | POA: Diagnosis not present

## 2022-10-24 DIAGNOSIS — Z79899 Other long term (current) drug therapy: Secondary | ICD-10-CM | POA: Diagnosis not present

## 2022-10-24 DIAGNOSIS — M25561 Pain in right knee: Secondary | ICD-10-CM | POA: Diagnosis not present

## 2022-10-24 DIAGNOSIS — I7300 Raynaud's syndrome without gangrene: Secondary | ICD-10-CM | POA: Diagnosis not present

## 2022-10-24 DIAGNOSIS — M1990 Unspecified osteoarthritis, unspecified site: Secondary | ICD-10-CM | POA: Diagnosis not present

## 2022-10-24 DIAGNOSIS — M359 Systemic involvement of connective tissue, unspecified: Secondary | ICD-10-CM | POA: Diagnosis not present

## 2022-10-24 DIAGNOSIS — M7071 Other bursitis of hip, right hip: Secondary | ICD-10-CM | POA: Diagnosis not present

## 2022-10-24 DIAGNOSIS — R21 Rash and other nonspecific skin eruption: Secondary | ICD-10-CM | POA: Diagnosis not present

## 2022-10-24 DIAGNOSIS — Q796 Ehlers-Danlos syndrome, unspecified: Secondary | ICD-10-CM | POA: Diagnosis not present

## 2022-10-28 DIAGNOSIS — R0602 Shortness of breath: Secondary | ICD-10-CM | POA: Diagnosis not present

## 2022-10-28 DIAGNOSIS — R55 Syncope and collapse: Secondary | ICD-10-CM | POA: Diagnosis not present

## 2022-10-28 DIAGNOSIS — I340 Nonrheumatic mitral (valve) insufficiency: Secondary | ICD-10-CM | POA: Diagnosis not present

## 2022-10-28 DIAGNOSIS — R000 Tachycardia, unspecified: Secondary | ICD-10-CM | POA: Diagnosis not present

## 2022-10-31 ENCOUNTER — Other Ambulatory Visit: Payer: Self-pay | Admitting: Internal Medicine

## 2022-10-31 DIAGNOSIS — R7989 Other specified abnormal findings of blood chemistry: Secondary | ICD-10-CM | POA: Diagnosis not present

## 2022-10-31 DIAGNOSIS — E042 Nontoxic multinodular goiter: Secondary | ICD-10-CM

## 2022-11-04 DIAGNOSIS — R000 Tachycardia, unspecified: Secondary | ICD-10-CM | POA: Diagnosis not present

## 2022-11-05 ENCOUNTER — Ambulatory Visit: Payer: Self-pay | Admitting: *Deleted

## 2022-11-05 ENCOUNTER — Encounter: Payer: Self-pay | Admitting: *Deleted

## 2022-11-05 NOTE — Patient Outreach (Signed)
Care Coordination   Follow Up Visit Note   11/05/2022 Name: Lorraine Weber MRN: [id number] DOB: 1980-07-27  Lorraine Weber is a 42 y.o. year old female who sees Elfredia Nevins, MD for primary care. I spoke with  Lorraine Weber by phone today.  What matters to the patients health and wellness today?  Talking with someone about insurance options and following up with cardiologist regarding non-rheumatic heart valve insufficiency.    Goals Addressed             This Visit's Progress    Care Coordination Services   On track    Care Coordination Goals: Patient will keep all medical appointments Patient will keep Appt with [facility] for a cardiology follow-up and echocardiogram on 11/03/23 and will reach out sooner if necessary Patient will reach out to June Carlson with the [facility] office at [phone] regarding guidance on insurance options  Patient will continue to work with Maxwell Caul, PharmD with [facility] regarding prescription assistance Patient will reach out to RN Care Coordinator at [phone] with any resource or care coordination needs        SDOH assessments and interventions completed:  Yes  SDOH Interventions Today    Flowsheet Row Most Recent Value  SDOH Interventions   Transportation Interventions Patient Resources (Friends/Family)        Care Coordination Interventions:  Yes, provided  Interventions Today    Flowsheet Row Most Recent Value  Chronic Disease   Chronic disease during today's visit Other  [aortic valve insufficiency]  General Interventions   General Interventions Discussed/Reviewed General Interventions Reviewed, General Interventions Discussed, Doctor Visits, Durable Medical Equipment (DME), Community Resources  Doctor Visits Discussed/Reviewed Doctor Visits Discussed, Doctor Visits Reviewed, Specialist  [Discussed recent 2nd opinion visit with cardiologist at [facility]. Per patient, both cardiologists gave  her the same results. She feels comfortable with the recommendations for F/U since they both agreed. She will continue to see the cardiologist at [facility].]  Durable Medical Equipment (DME) --  [patient is wearing a 3 day heart monitor]  PCP/Specialist Visits Compliance with follow-up visit  [Follow up with cardiologist at [facility] and having a repeat echocardiogram on 11/03/23]  Education Interventions   Education Provided Provided Education  Provided Verbal Education On When to see the doctor, Mental Health/Coping with Illness  [follow-up with cardiologist sooner with any new or worsening symptoms. Provided information on SHIIP and contact informatoin for June Carlson [phone]]  Mental Health Interventions   Mental Health Discussed/Reviewed Mental Health Reviewed, Mental Health Discussed  [Patient feels much more comfortable with her diagnosis and treatment plan since talking with two cardiologists that had the same interpretation and recommendation.]  Pharmacy Interventions   Pharmacy Dicussed/Reviewed Pharmacy Topics Discussed, Pharmacy Topics Reviewed, Affording Medications  [Patient has been working with Maxwell Caul, PharmD with [facility]: prescription assistance for [facility] and Nurtec. She was able to get funding for emgality but hasn't heard back on Nurtec yet.]       Follow up plan: Follow up call scheduled for 01/01/23    Encounter Outcome:  Patient Visit Completed   Demetrios Loll, RN, BSN Care Management Coordinator [facility]  [facility] Direct Dial: [phone] Main #: [phone]

## 2022-11-06 ENCOUNTER — Ambulatory Visit
Admission: RE | Admit: 2022-11-06 | Discharge: 2022-11-06 | Disposition: A | Discharge status: Home / Self Care | Payer: Medicare Other | Source: Ambulatory Visit | Attending: Internal Medicine | Admitting: Internal Medicine

## 2022-11-06 DIAGNOSIS — E041 Nontoxic single thyroid nodule: Secondary | ICD-10-CM | POA: Diagnosis not present

## 2022-11-06 DIAGNOSIS — E042 Nontoxic multinodular goiter: Secondary | ICD-10-CM

## 2022-11-12 DIAGNOSIS — R000 Tachycardia, unspecified: Secondary | ICD-10-CM | POA: Diagnosis not present

## 2022-12-05 DIAGNOSIS — Z4541 Encounter for adjustment and management of cerebrospinal fluid drainage device: Secondary | ICD-10-CM | POA: Diagnosis not present

## 2022-12-05 DIAGNOSIS — R519 Headache, unspecified: Secondary | ICD-10-CM | POA: Diagnosis not present

## 2022-12-05 DIAGNOSIS — G8929 Other chronic pain: Secondary | ICD-10-CM | POA: Diagnosis not present

## 2022-12-05 DIAGNOSIS — Z982 Presence of cerebrospinal fluid drainage device: Secondary | ICD-10-CM | POA: Diagnosis not present

## 2022-12-12 DIAGNOSIS — G43009 Migraine without aura, not intractable, without status migrainosus: Secondary | ICD-10-CM | POA: Diagnosis not present

## 2022-12-12 DIAGNOSIS — I5031 Acute diastolic (congestive) heart failure: Secondary | ICD-10-CM | POA: Diagnosis not present

## 2022-12-16 DIAGNOSIS — U071 COVID-19: Secondary | ICD-10-CM | POA: Diagnosis not present

## 2022-12-16 DIAGNOSIS — J069 Acute upper respiratory infection, unspecified: Secondary | ICD-10-CM | POA: Diagnosis not present

## 2022-12-19 DIAGNOSIS — Z982 Presence of cerebrospinal fluid drainage device: Secondary | ICD-10-CM | POA: Diagnosis not present

## 2022-12-19 DIAGNOSIS — G8929 Other chronic pain: Secondary | ICD-10-CM | POA: Diagnosis not present

## 2022-12-19 DIAGNOSIS — R519 Headache, unspecified: Secondary | ICD-10-CM | POA: Diagnosis not present

## 2022-12-24 ENCOUNTER — Encounter: Payer: Self-pay | Admitting: Internal Medicine

## 2022-12-26 DIAGNOSIS — Z6824 Body mass index (BMI) 24.0-24.9, adult: Secondary | ICD-10-CM | POA: Diagnosis not present

## 2022-12-26 DIAGNOSIS — I10 Essential (primary) hypertension: Secondary | ICD-10-CM | POA: Diagnosis not present

## 2022-12-26 DIAGNOSIS — G43009 Migraine without aura, not intractable, without status migrainosus: Secondary | ICD-10-CM | POA: Diagnosis not present

## 2022-12-26 DIAGNOSIS — R800 Isolated proteinuria: Secondary | ICD-10-CM | POA: Diagnosis not present

## 2022-12-26 DIAGNOSIS — Q796 Ehlers-Danlos syndrome, unspecified: Secondary | ICD-10-CM | POA: Diagnosis not present

## 2022-12-26 DIAGNOSIS — M792 Neuralgia and neuritis, unspecified: Secondary | ICD-10-CM | POA: Diagnosis not present

## 2022-12-31 ENCOUNTER — Ambulatory Visit: Payer: Medicare Other | Admitting: Physician Assistant

## 2022-12-31 ENCOUNTER — Encounter: Payer: Self-pay | Admitting: Physician Assistant

## 2022-12-31 VITALS — BP 98/64 | HR 89 | Ht 64.0 in | Wt 147.0 lb

## 2022-12-31 DIAGNOSIS — R1310 Dysphagia, unspecified: Secondary | ICD-10-CM

## 2022-12-31 DIAGNOSIS — K5909 Other constipation: Secondary | ICD-10-CM | POA: Diagnosis not present

## 2022-12-31 MED ORDER — LINACLOTIDE 290 MCG PO CAPS: 290.0000 ug | ORAL_CAPSULE | Freq: Every day | ORAL | 5 refills | Status: AC

## 2022-12-31 NOTE — Progress Notes (Signed)
Chief Complaint: Dysphagia  HPI:    Lorraine Weber is a 42 year old female with a past medical history as listed below including aortic valve insufficiency (08/16/2022 echo with LVEF 60-65% with severe aortic valve regurgitation but no stenosis) Ehlers-Danlos syndrome with vascular manifestations and clotting disorder on Plavix, known to Dr. Marina Goodell, trouble swallowing and abdominal pain who presents to clinic today to discuss dysphagia.      11/30/2019 patient saw Dr. Marina Goodell for follow-up after endoscopy which was completed 09/30/2019 for dysphagia.  She was found to have whitish exudate throughout the esophagus and biopsies revealed esophagitis.  There is distal narrowing which was dilated with 52 Jamaica Maloney dilator.  She is continued on twice daily PPI and at night H2 receptor antagonist therapy.  Prescribed mostly control of her reflux symptoms but continues with intermittent dysphagia.  At times discussed she continue PPI and H2 blocker.  Was anticipated that she is going off Plavix at the end of the year and it was noted that if dysphagia persisted or worsen then could consider repeat dilation with larger dilator.    03/03/2020 patient saw cardiology and at that time described progressive dyspnea on exertion.  Also chest pain.  CTA was negative for PE earlier that month.    03/23/2020 echo with LVEF 65-70%.  She was noted to have mild to moderate aortic valve sclerosis.  No evidence of stenosis    04/12/2020 patient seen in clinic by me and discussed that the cardiologist wanted her to have an EGD with dilation prior to proceeding with TEE for evaluation of heart issues.  Schedule patient for EGD with dilation in the LEC.  She had already been holding her Plavix.    04/18/2020 EGD with esophageal dilation with a 27 Jamaica showed benign-appearing esophageal stenosis, normal stomach with small hiatal hernia and normal examined duodenum.    05/13/2021 CT renal stone study showed bilateral nonobstructing renal  stones and aortic atherosclerosis.    05/20/2021 patient had a CT the abdomen pelvis with contrast which showed multiple nonobstructing renal calculi and small well-circumscribed intra-abdominal fluid collection along the tip of the right lobe of the liver which favored pseudocyst from prior VP shunt although no comparison imaging was available to confirm.    05/23/2021-05/28/2021 patient admitted to the hospital as she was status post VP shunt with abdominal pain nausea and diarrhea.  She had a lot of abdominal pain which was thought likely secondary to abdominal pseudocyst.  Please see that admission note for/12/23 for further details.   4 /15/23 abdominal ultrasound with nonobstructing right nephrolithiasis and previously described fluid collection inferior to the right hepatic lobe was not visualized.    06/06/2021 patient seen in follow-up by infectious disease.  Please see their note for details.  She continued with nausea, right upper quadrant and right lower quadrant abdominal pain and night sweats.  Also had oral thrush and was started on oral Fluconazole q. 4 days x 3 doses with mild relief.  She was then prescribed nystatin swish and swallow.    06/13/2021 patient seen with her husband for a myriad of issues including epigastric/right upper quadrant pain, dysphagia and thrush.  Discussed an appointment with interventional radiology for possible further cyst drainage in this area.  Discussed possible EGD with dilation but she had a lot of other things happening.  Increase Famotidine to 40 twice daily and continue Omeprazole 40 twice daily.    08/28/2021 EGD with 1 benign-appearing esophageal stenosis dilated.  10/07/2022 CTAP without contrast done for abdominal pain and renal colic.  With multiple nonobstructing bilateral renal calculi.    Today, patient presents to clinic accompanied by her significant other.  She describes that she has had increasing dysphagia over the past few months and would like to  get another EGD with dilation given that this has helped her so much in the past.  She continues on Omeprazole 40 mg twice daily.  Currently food is getting stuck on the way down and will eventually pass, this occurs more when she does not chew really well or eat slowly, also when she is distracted when eating.    Also describes constipation having only 1 bowel movement a week even though she uses 2 Senokot laxatives in the morning and MiraLAX 3 times a day.  Has never been on a prescription laxative in the past and would like to try one.  Associated symptoms include bloating and discomfort.    Denies fever, chills, rectal bleeding or abdominal pain.  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   Macular rash 09/25/2020   Mastoiditis    Migraines    Nephrolithiasis    X5, Dr Annabell Howells, passed stones, no surgery   Peptic ulcer disease    PONV (postoperative nausea and vomiting)    Septic joint of left wrist (HCC) 09/25/2020   Von Willebrand disease (HCC)    mild form per patient    Past Surgical History:  Procedure Laterality Date   APPENDECTOMY     cerebral stent Bilateral    jugular vein   CERVICAL FUSION  2018   C5-C7   DILITATION & CURRETTAGE/HYSTROSCOPY WITH NOVASURE ABLATION N/A 08/23/2015   Procedure: DILATATION & CURETTAGE/HYSTEROSCOPY WITH NOVASURE ABLATION;  Surgeon: Waynard Reeds, MD;  Location: [facility];  Service: Gynecology;  Laterality: N/A;   EXTRACORPOREAL SHOCK WAVE LITHOTRIPSY Left 03/19/2018   Procedure: EXTRACORPOREAL SHOCK WAVE LITHOTRIPSY (ESWL);  Surgeon: Alfredo Martinez, MD;  Location: [facility];  Service: Urology;  Laterality: Left;   HAND TENDON SURGERY Left    left, complicated by MRSA   LAMINECTOMY  21016   T10-T12   SHUNT REMOVAL  05/2021   spinal fluid leak patching     > 10X;[facility] - blood patching   SPINAL FUSION  2017   T10-T11   SPINAL FUSION  2018   T10-L1   TEE WITHOUT CARDIOVERSION N/A 05/17/2020   Procedure: TRANSESOPHAGEAL ECHOCARDIOGRAM (TEE);  Surgeon: Thurmon Fair, MD;  Location: [facility] ENDOSCOPY;  Service: Cardiovascular;  Laterality: N/A;   VENTRICULOPERITONEAL SHUNT  12/2020   WISDOM TOOTH EXTRACTION      Current Outpatient Medications  Medication Sig Dispense Refill   cyanocobalamin (,VITAMIN B-12,) 1000 MCG/ML injection Inject 1,000 mcg into the muscle every 30 (thirty) days.     famotidine (PEPCID) 40 MG tablet TAKE ONE TABLET BY MOUTH TWICE DAILY 60 tablet 3   fluticasone (FLONASE) 50 MCG/ACT nasal spray Place 1 spray into both nostrils daily.     furosemide (LASIX) 40  MG tablet TAKE 1 TABLET BY MOUTH ONCE DAILY *REFILL REQUEST* 90 tablet 3   gabapentin (NEURONTIN) 300 MG capsule Take 300 mg by mouth 3 (three) times daily.     hydroxychloroquine (PLAQUENIL) 200 MG tablet Take 200 mg by mouth daily. Patient takes 1 tablet one day then 2 tablets the next day, alternating     losartan (COZAAR) 100 MG tablet TAKE 1 TABLET BY MOUTH ONCE DAILY *REFILL REQUEST* 90 tablet 3   methocarbamol (ROBAXIN) 750 MG tablet Take 750 mg by mouth every 6 (six) hours as needed for muscle spasms.     metoprolol succinate (TOPROL-XL) 50 MG 24 hr tablet Take 1 tablet (50 mg total) by mouth daily. Take with or immediately following a meal. (Patient taking differently: Take 50 mg by mouth at bedtime. Take with or immediately following a meal.) 90 tablet 3   nortriptyline (PAMELOR) 50 MG capsule Take 50 mg by mouth at bedtime.     omeprazole (PRILOSEC) 40 MG capsule TAKE ONE CAPSULE BY MOUTH TWICE DAILY 180 capsule 2   ondansetron (ZOFRAN) 4 MG tablet  Take 4 mg by mouth every 8 (eight) hours as needed for vomiting or nausea.     tiZANidine (ZANAFLEX) 2 MG tablet Take 2 mg by mouth every 6 (six) hours as needed for muscle spasms.     traMADol (ULTRAM) 50 MG tablet Take 50-100 mg by mouth 4 (four) times daily as needed for moderate pain.  2   trifluoperazine (STELAZINE) 1 MG tablet Take 1 mg by mouth at bedtime.  Take 2 tablets     Vitamin D, Ergocalciferol, (DRISDOL) 1.25 MG (50000 UNIT) CAPS capsule Take 50,000 Units by mouth once a week.     No current facility-administered medications for this visit.    Allergies as of 12/31/2022 - Review Complete 11/05/2022  Allergen Reaction Noted   Acetazolamide Shortness Of Breath 12/31/2018   Other Other (See Comments) 01/01/2013   Vancomycin Itching and Other (See Comments) 05/23/2021   Levofloxacin Itching and Rash    Levofloxacin in d5w Swelling and Rash 08/08/2015   Sulfamethoxazole-trimethoprim Rash 01/17/2016    Family History  Problem Relation Age of Onset   Arthritis Father    Hyperlipidemia Father    Diabetes Father    Stroke Father        PTE post CVA   Kidney disease Father    Prostate cancer Paternal Grandfather    Hyperlipidemia Paternal Grandfather    Diabetes Paternal Grandfather    Heart disease Mother        ?hypertrophic idiopathic subaortic stenosis   Heart disease Maternal Grandmother    Diabetes Maternal Grandmother    Heart disease Maternal Grandfather        HISS   Heart disease Maternal Aunt        HISS   Stroke Maternal Aunt 50   Heart disease Maternal Uncle    Heart disease Brother        HISS   Other Other        EDS in M, bro & niece   Stomach cancer Neg Hx    Colon cancer Neg Hx    Rectal cancer Neg Hx    Ovarian cancer Neg Hx     Social History   Socioeconomic History   Marital status: Married    Spouse name: Not on file   Number of children: 0   Years of education: Not on file   Highest education level: Not on file  Occupational  History   Occupation: [employer]    Employer: [employer]   Occupation: disabled  Tobacco Use   Smoking status: Former    Current packs/day: 0.30    Average packs/day: 0.3 packs/day for 10.0 years (3.0 ttl pk-yrs)    Types: Cigarettes   Smokeless tobacco: Never   Tobacco comments:    smoked age 72-present, up to 1 ppd.05/24/14 1/2 ppd  Vaping Use   Vaping status: Never Used  Substance and Sexual Activity   Alcohol use: No    Alcohol/week: 0.0 standard drinks of alcohol   Drug use: No   Sexual activity: Yes    Partners: Male    Birth control/protection: None    Comment: IUD removed 06/2015  Other Topics Concern   Not on file  Social History Narrative   Not on file   Social Determinants of Health   Financial Resource Strain: Low Risk  (11/28/2022)   [facility]   Overall Financial Resource Strain (CARDIA)    Difficulty of Paying Living Expenses: Not very hard  Food Insecurity: No Food Insecurity (11/28/2022)   [facility]   Hunger Vital Sign    Worried About Running Out of Food in the Last Year: Never true    Ran Out of Food in the Last Year: Never true  Transportation Needs: No Transportation Needs (11/28/2022)   Received from [facility] - Transportation    In the past 12 months, has lack of transportation kept you from medical appointments or from getting medications?: No    Lack of Transportation (Non-Medical): No  Physical Activity: Not on file  Stress: Not on file  Social Connections: Not on file  Intimate Partner Violence: Not on file    Review of Systems:    Constitutional: No weight loss, fever or chills Cardiovascular: No chest pain   Respiratory: No SOB  Gastrointestinal: See HPI and otherwise negative   Physical Exam:  Vital signs: BP 98/64   Pulse 89   Ht 5\' 4"  (1.626 m)   Wt 147 lb (66.7 kg)   SpO2 100%   BMI 25.23 kg/m   Constitutional:    Pleasant Caucasian female appears to be in NAD, Well developed, Well nourished, alert and cooperative Respiratory: Respirations even and unlabored. Lungs clear to auscultation bilaterally.   No wheezes, crackles, or rhonchi.  Cardiovascular: Normal S1, S2. No MRG. Regular rate and rhythm. No peripheral edema, cyanosis or pallor.  Gastrointestinal:  Soft, nondistended, nontender. No rebound or guarding. Normal bowel sounds. No appreciable masses or hepatomegaly. Rectal:  Not performed.  Psychiatric: Oriented to person, place and time. Demonstrates good judgement and reason without abnormal affect or behaviors.  RELEVANT LABS AND IMAGING: CBC    Component Value Date/Time   WBC 8.2 05/13/2021 1132   RBC 4.98 05/13/2021 1132   HGB 13.8 05/13/2021 1132   HGB 12.8 05/12/2020 1432   HGB 13.7 03/15/2013 1345   HCT 41.7 05/13/2021 1132   HCT 38.4 05/12/2020 1432   HCT 41.2 03/15/2013 1345   PLT 309 05/13/2021 1132   PLT 311 05/12/2020 1432   MCV 83.7 05/13/2021 1132   MCV 85 05/12/2020 1432   MCV 88.2 03/15/2013 1345   MCH 27.7 05/13/2021 1132   MCHC 33.1 05/13/2021 1132   RDW 14.6 05/13/2021 1132   RDW 14.2 05/12/2020 1432   RDW 18.7 (H) 03/15/2013 1345   LYMPHSABS 2.1 09/16/2017 1709  LYMPHSABS 2.4 03/15/2013 1345   MONOABS 0.6 09/16/2017 1709   MONOABS 0.7 03/15/2013 1345   EOSABS 0.0 09/16/2017 1709   EOSABS 0.1 03/15/2013 1345   BASOSABS 0.0 09/16/2017 1709   BASOSABS 0.0 03/15/2013 1345    CMP     Component Value Date/Time   NA 137 05/13/2021 1132   NA 141 05/12/2020 1432   NA 138 03/15/2013 1345   K 4.0 05/13/2021 1132   K 3.3 (L) 03/15/2013 1345   CL 99 05/13/2021 1132   CO2 27 05/13/2021 1132   CO2 26 03/15/2013 1345   GLUCOSE 94 05/13/2021 1132   GLUCOSE 97 03/15/2013 1345   BUN 9 05/13/2021 1132   BUN 11 05/12/2020 1432   BUN 6.8 (L) 03/15/2013 1345   CREATININE 0.91 05/13/2021 1132   CREATININE 0.67 02/16/2016 1219   CREATININE 0.7 03/15/2013 1345    CALCIUM 9.7 05/13/2021 1132   CALCIUM 9.3 03/15/2013 1345   PROT 8.4 (H) 05/13/2021 1132   PROT 7.1 03/15/2013 1345   ALBUMIN 4.5 05/13/2021 1132   ALBUMIN 4.0 03/15/2013 1345   AST 13 (L) 05/13/2021 1132   AST 17 03/15/2013 1345   ALT 6 05/13/2021 1132   ALT 13 03/15/2013 1345   ALKPHOS 101 05/13/2021 1132   ALKPHOS 112 03/15/2013 1345   BILITOT 0.8 05/13/2021 1132   BILITOT 0.27 03/15/2013 1345   GFRNONAA >60 05/13/2021 1132   GFRAA 88 03/03/2020 1500    Assessment: 1.  Dysphagia: Prior esophageal stenosis last dilated in July 2023 with good result 2.  Chronic constipation: 1 bowel movement a week regardless of MiraLAX 3 times daily and a stimulant laxative 3.  Screening for colorectal cancer: Last colonoscopy 12/12/2017 done for abdominal pain, rectal bleeding and constipation with a 5 mm polyp in the transverse colon, new guidelines recommend repeat in 7 to 10 years  Plan: 1.  Prescribed Linzess 290 mcg daily 30 minutes before breakfast #30 with 5 refills.  Also gave her some samples. 2.  Scheduled patient for an EGD with dilation in the LEC.  Will check with Dr. Marina Goodell and Cathlyn Parsons our anesthesiologist to make sure the LEC is a correct placement for her.  Did provide the patient a detailed list of risks for the procedure and she agrees to proceed. 3.  Continue Omeprazole 40 mg twice daily. 4.  Patient to follow in clinic per recommendations after time of procedure.    Hyacinth Meeker, PA-C [facility] 12/31/2022, 2:46 PM  Cc: Elfredia Nevins, MD

## 2022-12-31 NOTE — Patient Instructions (Signed)
_______________________________________________________  If your blood pressure at your visit was 140/90 or greater, please contact your primary care physician to follow up on this.  _______________________________________________________  If you are age 44 or older, your body mass index should be between 23-30. Your Body mass index is 25.23 kg/m. If this is out of the aforementioned range listed, please consider follow up with your Primary Care Provider.  If you are age 22 or younger, your body mass index should be between 19-25. Your Body mass index is 25.23 kg/m. If this is out of the aformentioned range listed, please consider follow up with your Primary Care Provider.   ________________________________________________________  The [facility] providers would like to encourage you to use [facility] to communicate with providers for non-urgent requests or questions.  Due to long hold times on the telephone, sending your provider a message by [facility] may be a faster and more efficient way to get a response.  Please allow 48 business hours for a response.  Please remember that this is for non-urgent requests.  _______________________________________________________  We have sent the following medications to your pharmacy for you to pick up at your convenience:  START: Linzess one capsule daily 30 minutes before breakfast meal each day.  It has been recommended that you complete a bowel purge (to clean out your bowels).  Please do the following: Purchase a bottle of Miralax over the counter as well as a box of 5 mg dulcolax tablets.  Take 4 dulcolax tablets. Wait 1 hour.  You will then drink 6-8 capfuls of Miralax mixed in an adequate amount of water/juice/gatorade (you may choose which of these liquids to drink) over the next 2-3 hours.  You should expect results within 1 to 6 hours after completing the bowel purge.  CONTINUE: omeprazole 40mg  one capsule twice daily   You have  been scheduled for an endoscopy. Please follow written instructions given to you at your visit today.  If you use inhalers (even only as needed), please bring them with you on the day of your procedure.  If you take any of the following medications, they will need to be adjusted prior to your procedure:   DO NOT TAKE 7 DAYS PRIOR TO TEST- Trulicity (dulaglutide) Ozempic, Wegovy (semaglutide) Mounjaro (tirzepatide) Bydureon Bcise (exanatide extended release)  DO NOT TAKE 1 DAY PRIOR TO YOUR TEST Rybelsus (semaglutide) Adlyxin (lixisenatide) Victoza (liraglutide) Byetta (exanatide) ___________________________________________________________________________  Due to recent changes in healthcare laws, you may see the results of your imaging and laboratory studies on MyChart before your provider has had a chance to review them.  We understand that in some cases there may be results that are confusing or concerning to you. Not all laboratory results come back in the same time frame and the provider may be waiting for multiple results in order to interpret others.  Please give [place] 48 hours in order for your provider to thoroughly review all the results before contacting the office for clarification of your results.   Thank you for entrusting me with your care and choosing [facility].  Hyacinth Meeker, PA-C

## 2023-01-01 ENCOUNTER — Ambulatory Visit: Payer: Self-pay | Admitting: *Deleted

## 2023-01-01 ENCOUNTER — Encounter: Payer: Self-pay | Admitting: *Deleted

## 2023-01-01 NOTE — Patient Outreach (Signed)
  Care Coordination   Follow Up Visit Note   01/01/2023 Name: Lorraine Weber MRN: [id number] DOB: Sep 28, 1980  Lorraine Weber is a 42 y.o. year old female who sees Elfredia Nevins, MD for primary care. I spoke with  Lorraine Weber by phone today.  What matters to the patients health and wellness today?  No specific concerns endorsed today.    Goals Addressed             This Visit's Progress    COMPLETED: Care Coordination Services   On track    Care Coordination Goals: Patient will keep all medical appointments Patient will continue to work with Maxwell Caul, PharmD with [facility] regarding prescription assistance Patient will reach out to PCP office with any care management needs.  Patient does not have any acute or urgent needs related to this goal and will follow-up with PCP regarding management.         SDOH assessments and interventions completed:  Yes  SDOH Interventions Today    Flowsheet Row Most Recent Value  SDOH Interventions   Transportation Interventions Patient Resources (Friends/Family)  Health Literacy Interventions Intervention Not Indicated        Care Coordination Interventions:  Yes, provided  Interventions Today    Flowsheet Row Most Recent Value  Chronic Disease   Chronic disease during today's visit Other  [Nonrheumatic aortic valve insufficiency, mitral valve insufficiency]  General Interventions   General Interventions Discussed/Reviewed General Interventions Discussed, General Interventions Reviewed, Doctor Visits, Community Resources  Doctor Visits Discussed/Reviewed Doctor Visits Discussed, Doctor Visits Reviewed, Annual Wellness Visits, PCP, Specialist  PCP/Specialist Visits Compliance with follow-up visit  [Dr Marina Goodell (gastroenterology) on 01/27/23, [facility] 02/24/23, [facility] 11/03/23. Follow-up with PCP as planned and with any care management needs.]  Education Interventions   Education Provided Provided Education   Provided Verbal Education On When to see the doctor, [employer], Public affairs consultant was appreciative for the referral to June Carlson with the [facility] program. She was very helpful in assisting with electing insurance coverage for 2025.]       Follow up plan: No further intervention required. Patient's Primary Care office is not partnering with the [facility] for care management and will be providing Care Management Services themselves. This final Care Management note will be securely faxed to the PCP office for handoff. Patient has been encouraged to reach out to their PCP office with any resource or care management needs.   Encounter Outcome:  Patient Visit Completed   Demetrios Loll, RN, BSN Care Management Coordinator [facility]  [facility] Direct Dial: [phone] Main #: [phone]

## 2023-01-02 DIAGNOSIS — G43709 Chronic migraine without aura, not intractable, without status migrainosus: Secondary | ICD-10-CM | POA: Diagnosis not present

## 2023-01-02 DIAGNOSIS — M5481 Occipital neuralgia: Secondary | ICD-10-CM | POA: Diagnosis not present

## 2023-01-03 DIAGNOSIS — M62830 Muscle spasm of back: Secondary | ICD-10-CM | POA: Diagnosis not present

## 2023-01-03 DIAGNOSIS — M5416 Radiculopathy, lumbar region: Secondary | ICD-10-CM | POA: Diagnosis not present

## 2023-01-03 DIAGNOSIS — I10 Essential (primary) hypertension: Secondary | ICD-10-CM | POA: Diagnosis not present

## 2023-01-03 DIAGNOSIS — G43009 Migraine without aura, not intractable, without status migrainosus: Secondary | ICD-10-CM | POA: Diagnosis not present

## 2023-01-11 DIAGNOSIS — G894 Chronic pain syndrome: Secondary | ICD-10-CM | POA: Diagnosis not present

## 2023-01-11 DIAGNOSIS — G43009 Migraine without aura, not intractable, without status migrainosus: Secondary | ICD-10-CM | POA: Diagnosis not present

## 2023-01-16 ENCOUNTER — Encounter: Payer: Self-pay | Admitting: Internal Medicine

## 2023-01-16 DIAGNOSIS — G43709 Chronic migraine without aura, not intractable, without status migrainosus: Secondary | ICD-10-CM | POA: Diagnosis not present

## 2023-01-16 DIAGNOSIS — M7918 Myalgia, other site: Secondary | ICD-10-CM | POA: Diagnosis not present

## 2023-01-18 DIAGNOSIS — M359 Systemic involvement of connective tissue, unspecified: Secondary | ICD-10-CM | POA: Diagnosis not present

## 2023-01-18 DIAGNOSIS — Z79899 Other long term (current) drug therapy: Secondary | ICD-10-CM | POA: Diagnosis not present

## 2023-01-20 DIAGNOSIS — R202 Paresthesia of skin: Secondary | ICD-10-CM | POA: Diagnosis not present

## 2023-01-23 DIAGNOSIS — M7061 Trochanteric bursitis, right hip: Secondary | ICD-10-CM | POA: Diagnosis not present

## 2023-01-27 ENCOUNTER — Encounter: Payer: Self-pay | Admitting: Internal Medicine

## 2023-01-27 ENCOUNTER — Ambulatory Visit (AMBULATORY_SURGERY_CENTER): Payer: Medicare Other | Admitting: Internal Medicine

## 2023-01-27 VITALS — BP 142/76 | HR 77 | Temp 96.4°F | Resp 21 | Ht 64.0 in | Wt 147.0 lb

## 2023-01-27 DIAGNOSIS — R1310 Dysphagia, unspecified: Secondary | ICD-10-CM | POA: Diagnosis not present

## 2023-01-27 DIAGNOSIS — K222 Esophageal obstruction: Secondary | ICD-10-CM

## 2023-01-27 DIAGNOSIS — K219 Gastro-esophageal reflux disease without esophagitis: Secondary | ICD-10-CM

## 2023-01-27 DIAGNOSIS — K449 Diaphragmatic hernia without obstruction or gangrene: Secondary | ICD-10-CM

## 2023-01-27 MED ORDER — SODIUM CHLORIDE 0.9 % IV SOLN: 500.0000 mL | Freq: Once | INTRAVENOUS | Status: DC

## 2023-01-27 NOTE — Progress Notes (Signed)
Called to room to assist during endoscopic procedure.  Patient ID and intended procedure confirmed with present staff. Received instructions for my participation in the procedure from the performing physician.  

## 2023-01-27 NOTE — Progress Notes (Signed)
Pt's states no medical or surgical changes since previsit or office visit. 

## 2023-01-27 NOTE — Progress Notes (Signed)
Vss nad trans to pacu 

## 2023-01-27 NOTE — Progress Notes (Signed)
Expand All Collapse All    Chief Complaint: Dysphagia   HPI:    Lorraine Weber is a 42 year old female with a past medical history as listed below including aortic valve insufficiency (08/16/2022 echo with LVEF 60-65% with severe aortic valve regurgitation but no stenosis) Ehlers-Danlos syndrome with vascular manifestations and clotting disorder on Plavix, known to Dr. Marina Goodell, trouble swallowing and abdominal pain who presents to clinic today to discuss dysphagia.      11/30/2019 patient saw Dr. Marina Goodell for follow-up after endoscopy which was completed 09/30/2019 for dysphagia.  She was found to have whitish exudate throughout the esophagus and biopsies revealed esophagitis.  There is distal narrowing which was dilated with 52 Jamaica Maloney dilator.  She is continued on twice daily PPI and at night H2 receptor antagonist therapy.  Prescribed mostly control of her reflux symptoms but continues with intermittent dysphagia.  At times discussed she continue PPI and H2 blocker.  Was anticipated that she is going off Plavix at the end of the year and it was noted that if dysphagia persisted or worsen then could consider repeat dilation with larger dilator.    03/03/2020 patient saw cardiology and at that time described progressive dyspnea on exertion.  Also chest pain.  CTA was negative for PE earlier that month.    03/23/2020 echo with LVEF 65-70%.  She was noted to have mild to moderate aortic valve sclerosis.  No evidence of stenosis    04/12/2020 patient seen in clinic by me and discussed that the cardiologist wanted her to have an EGD with dilation prior to proceeding with TEE for evaluation of heart issues.  Schedule patient for EGD with dilation in the LEC.  She had already been holding her Plavix.    04/18/2020 EGD with esophageal dilation with a 72 Jamaica showed benign-appearing esophageal stenosis, normal stomach with small hiatal hernia and normal examined duodenum.    05/13/2021 CT renal stone study showed  bilateral nonobstructing renal stones and aortic atherosclerosis.    05/20/2021 patient had a CT the abdomen pelvis with contrast which showed multiple nonobstructing renal calculi and small well-circumscribed intra-abdominal fluid collection along the tip of the right lobe of the liver which favored pseudocyst from prior VP shunt although no comparison imaging was available to confirm.    05/23/2021-05/28/2021 patient admitted to the hospital as she was status post VP shunt with abdominal pain nausea and diarrhea.  She had a lot of abdominal pain which was thought likely secondary to abdominal pseudocyst.  Please see that admission note for/12/23 for further details.   4 /15/23 abdominal ultrasound with nonobstructing right nephrolithiasis and previously described fluid collection inferior to the right hepatic lobe was not visualized.    06/06/2021 patient seen in follow-up by infectious disease.  Please see their note for details.  She continued with nausea, right upper quadrant and right lower quadrant abdominal pain and night sweats.  Also had oral thrush and was started on oral Fluconazole q. 4 days x 3 doses with mild relief.  She was then prescribed nystatin swish and swallow.    06/13/2021 patient seen with her husband for a myriad of issues including epigastric/right upper quadrant pain, dysphagia and thrush.  Discussed an appointment with interventional radiology for possible further cyst drainage in this area.  Discussed possible EGD with dilation but she had a lot of other things happening.  Increase Famotidine to 40 twice daily and continue Omeprazole 40 twice daily.    08/28/2021 EGD with 1 benign-appearing  esophageal stenosis dilated.    10/07/2022 CTAP without contrast done for abdominal pain and renal colic.  With multiple nonobstructing bilateral renal calculi.    Today, patient presents to clinic accompanied by her significant other.  She describes that she has had increasing dysphagia over the  past few months and would like to get another EGD with dilation given that this has helped her so much in the past.  She continues on Omeprazole 40 mg twice daily.  Currently food is getting stuck on the way down and will eventually pass, this occurs more when she does not chew really well or eat slowly, also when she is distracted when eating.    Also describes constipation having only 1 bowel movement a week even though she uses 2 Senokot laxatives in the morning and MiraLAX 3 times a day.  Has never been on a prescription laxative in the past and would like to try one.  Associated symptoms include bloating and discomfort.    Denies fever, chills, rectal bleeding or abdominal pain.       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   Macular rash 09/25/2020   Mastoiditis     Migraines     Nephrolithiasis      X5, Dr Annabell Howells, passed stones, no surgery   Peptic ulcer disease     PONV (postoperative nausea and vomiting)     Septic joint of left wrist (HCC) 09/25/2020   Von Willebrand disease (HCC)      mild form per patient               Past Surgical History:  Procedure Laterality Date   APPENDECTOMY       cerebral stent Bilateral      jugular vein   CERVICAL FUSION   2018    C5-C7   DILITATION & CURRETTAGE/HYSTROSCOPY WITH NOVASURE ABLATION N/A 08/23/2015    Procedure: DILATATION & CURETTAGE/HYSTEROSCOPY WITH NOVASURE ABLATION;  Surgeon: Waynard Reeds, MD;  Location: [facility];  Service: Gynecology;  Laterality: N/A;   EXTRACORPOREAL SHOCK WAVE LITHOTRIPSY Left 03/19/2018     Procedure: EXTRACORPOREAL SHOCK WAVE LITHOTRIPSY (ESWL);  Surgeon: Alfredo Martinez, MD;  Location: [facility];  Service: Urology;  Laterality: Left;   HAND TENDON SURGERY Left      left, complicated by MRSA   LAMINECTOMY   21016    T10-T12   SHUNT REMOVAL   05/2021   spinal fluid leak patching        > 10X;[facility] - blood patching   SPINAL FUSION   2017    T10-T11   SPINAL FUSION   2018    T10-L1   TEE WITHOUT CARDIOVERSION N/A 05/17/2020    Procedure: TRANSESOPHAGEAL ECHOCARDIOGRAM (TEE);  Surgeon: Thurmon Fair, MD;  Location[facility] END[facility]COPY;  Service: Cardiovascular;  Laterality: N/A;   VENTRICULOPERITONEAL SHUNT   12/2020   WISDOM TOOTH EXTRACTION  Current Outpatient Medications  Medication Sig Dispense Refill   cyanocobalamin (,VITAMIN B-12,) 1000 MCG/ML injection Inject 1,000 mcg into the muscle every 30 (thirty) days.       famotidine (PEPCID) 40 MG tablet TAKE ONE TABLET BY MOUTH TWICE DAILY 60 tablet 3   fluticasone (FLONASE) 50 MCG/ACT nasal spray Place 1 spray into both nostrils daily.       furosemide (LASIX) 40 MG tablet TAKE 1 TABLET BY MOUTH ONCE DAILY *REFILL REQUEST* 90 tablet 3   gabapentin (NEURONTIN) 300 MG capsule Take 300 mg by mouth 3 (three) times daily.       hydroxychloroquine (PLAQUENIL) 200 MG tablet Take 200 mg by mouth daily. Patient takes 1 tablet one day then 2 tablets the next day, alternating       losartan (COZAAR) 100 MG tablet TAKE 1 TABLET BY MOUTH ONCE DAILY *REFILL REQUEST* 90 tablet 3   methocarbamol (ROBAXIN) 750 MG tablet Take 750 mg by mouth every 6 (six) hours as needed for muscle spasms.       metoprolol succinate (TOPROL-XL) 50 MG 24 hr tablet Take 1 tablet (50 mg total) by mouth daily. Take with or immediately following a meal. (Patient taking differently: Take 50 mg by mouth at bedtime. Take with or immediately following a meal.) 90 tablet 3   nortriptyline (PAMELOR) 50 MG capsule Take 50 mg by mouth at bedtime.        omeprazole (PRILOSEC) 40 MG capsule TAKE ONE CAPSULE BY MOUTH TWICE DAILY 180 capsule 2   ondansetron (ZOFRAN) 4 MG tablet Take 4 mg by mouth every 8 (eight) hours as needed for vomiting or nausea.       tiZANidine (ZANAFLEX) 2 MG tablet Take 2 mg by mouth every 6 (six) hours as needed for muscle spasms.       traMADol (ULTRAM) 50 MG tablet Take 50-100 mg by mouth 4 (four) times daily as needed for moderate pain.   2   trifluoperazine (STELAZINE) 1 MG tablet Take 1 mg by mouth at bedtime.  Take 2 tablets       Vitamin D, Ergocalciferol, (DRISDOL) 1.25 MG (50000 UNIT) CAPS capsule Take 50,000 Units by mouth once a week.          No current facility-administered medications for this visit.             Allergies as of 12/31/2022 - Review Complete 11/05/2022  Allergen Reaction Noted   Acetazolamide Shortness Of Breath 12/31/2018   Other Other (See Comments) 01/01/2013   Vancomycin Itching and Other (See Comments) 05/23/2021   Levofloxacin Itching and Rash     Levofloxacin in d5w Swelling and Rash 08/08/2015   Sulfamethoxazole-trimethoprim Rash 01/17/2016           Family History  Problem Relation Age of Onset   Arthritis Father     Hyperlipidemia Father     Diabetes Father     Stroke Father          PTE post CVA   Kidney disease Father     Prostate cancer Paternal Grandfather     Hyperlipidemia Paternal Grandfather     Diabetes Paternal Grandfather     Heart disease Mother          ?hypertrophic idiopathic subaortic stenosis   Heart disease Maternal Grandmother     Diabetes Maternal Grandmother     Heart disease Maternal Grandfather          HISS   Heart disease Maternal Aunt  HISS   Stroke Maternal Aunt 50   Heart disease Maternal Uncle     Heart disease Brother          HISS   Other Other          EDS in M, bro & niece   Stomach cancer Neg Hx     Colon cancer Neg Hx     Rectal cancer Neg Hx     Ovarian cancer Neg Hx            Social History          Socioeconomic History   Marital status: Married      Spouse name: Not on file   Number of children: 0   Years of education: Not on file   Highest education level: Not on file  Occupational History   Occupation: [employer]      Employer: [employer]   Occupation: disabled  Tobacco Use   Smoking status: Former      Current packs/day: 0.30      Average packs/day: 0.3 packs/day for 10.0 years (3.0 ttl pk-yrs)      Types: Cigarettes   Smokeless tobacco: Never   Tobacco comments:      smoked age 34-present, up to 1 ppd.05/24/14 1/2 ppd  Vaping Use   Vaping status: Never Used  Substance and Sexual Activity   Alcohol use: No      Alcohol/week: 0.0 standard drinks of alcohol   Drug use: No   Sexual activity: Yes      Partners: Male      Birth control/protection: None      Comment: IUD removed 06/2015  Other Topics Concern   Not on file  Social History Narrative   Not on file    Social Determinants of Health        Financial Resource Strain: Low Risk  (11/28/2022)    Received from [facility]    Overall Financial Resource Strain (CARDIA)     Difficulty of Paying Living Expenses: Not very hard  Food Insecurity: No Food Insecurity (11/28/2022)    Received from [facility]    Hunger Vital Sign     Worried About Running Out of Food in the Last Year: Never true     Ran Out of Food in the Last Year: Never true  Transportation Needs: No Transportation Needs (11/28/2022)    Received from [facility] - Transportation     In the past 12 months, has lack of transportation kept you from medical appointments or from getting medications?: No     Lack of Transportation (Non-Medical): No  Physical Activity: Not on file  Stress: Not on file  Social Connections: Not on file  Intimate Partner Violence: Not on file      Review of Systems:    Constitutional: No weight loss, fever or chills Cardiovascular: No  chest pain   Respiratory: No SOB  Gastrointestinal: See HPI and otherwise negative    Physical Exam:  Vital signs: BP 98/64   Pulse 89   Ht 5\' 4"  (1.626 m)   Wt 147 lb (66.7 kg)   SpO2 100%   BMI 25.23 kg/m   Constitutional:   Pleasant Caucasian female appears to be in NAD, Well developed, Well nourished, alert and cooperative Respiratory: Respirations even and unlabored. Lungs clear to auscultation bilaterally.   No wheezes, crackles, or rhonchi.  Cardiovascular: Normal S1, S2. No  MRG. Regular rate and rhythm. No peripheral edema, cyanosis or pallor.  Gastrointestinal:  Soft, nondistended, nontender. No rebound or guarding. Normal bowel sounds. No appreciable masses or hepatomegaly. Rectal:  Not performed.  Psychiatric: Oriented to person, place and time. Demonstrates good judgement and reason without abnormal affect or behaviors.   RELEVANT LABS AND IMAGING: CBC Labs (Brief)          Component Value Date/Time    WBC 8.2 05/13/2021 1132    RBC 4.98 05/13/2021 1132    HGB 13.8 05/13/2021 1132    HGB 12.8 05/12/2020 1432    HGB 13.7 03/15/2013 1345    HCT 41.7 05/13/2021 1132    HCT 38.4 05/12/2020 1432    HCT 41.2 03/15/2013 1345    PLT 309 05/13/2021 1132    PLT 311 05/12/2020 1432    MCV 83.7 05/13/2021 1132    MCV 85 05/12/2020 1432    MCV 88.2 03/15/2013 1345    MCH 27.7 05/13/2021 1132    MCHC 33.1 05/13/2021 1132    RDW 14.6 05/13/2021 1132    RDW 14.2 05/12/2020 1432    RDW 18.7 (H) 03/15/2013 1345    LYMPHSABS 2.1 09/16/2017 1709    LYMPHSABS 2.4 03/15/2013 1345    MONOABS 0.6 09/16/2017 1709    MONOABS 0.7 03/15/2013 1345    EOSABS 0.0 09/16/2017 1709    EOSABS 0.1 03/15/2013 1345    BASOSABS 0.0 09/16/2017 1709    BASOSABS 0.0 03/15/2013 1345        CMP     Labs (Brief)          Component Value Date/Time    NA 137 05/13/2021 1132    NA 141 05/12/2020 1432    NA 138 03/15/2013 1345    K 4.0 05/13/2021 1132    K 3.3 (L) 03/15/2013 1345    CL 99  05/13/2021 1132    CO2 27 05/13/2021 1132    CO2 26 03/15/2013 1345    GLUCOSE 94 05/13/2021 1132    GLUCOSE 97 03/15/2013 1345    BUN 9 05/13/2021 1132    BUN 11 05/12/2020 1432    BUN 6.8 (L) 03/15/2013 1345    CREATININE 0.91 05/13/2021 1132    CREATININE 0.67 02/16/2016 1219    CREATININE 0.7 03/15/2013 1345    CALCIUM 9.7 05/13/2021 1132    CALCIUM 9.3 03/15/2013 1345    PROT 8.4 (H) 05/13/2021 1132    PROT 7.1 03/15/2013 1345    ALBUMIN 4.5 05/13/2021 1132    ALBUMIN 4.0 03/15/2013 1345    AST 13 (L) 05/13/2021 1132    AST 17 03/15/2013 1345    ALT 6 05/13/2021 1132    ALT 13 03/15/2013 1345    ALKPHOS 101 05/13/2021 1132    ALKPHOS 112 03/15/2013 1345    BILITOT 0.8 05/13/2021 1132    BILITOT 0.27 03/15/2013 1345    GFRNONAA >60 05/13/2021 1132    GFRAA 88 03/03/2020 1500        Assessment: 1.  Dysphagia: Prior esophageal stenosis last dilated in July 2023 with good result 2.  Chronic constipation: 1 bowel movement a week regardless of MiraLAX 3 times daily and a stimulant laxative 3.  Screening for colorectal cancer: Last colonoscopy 12/12/2017 done for abdominal pain, rectal bleeding and constipation with a 5 mm polyp in the transverse colon, new guidelines recommend repeat in 7 to 10 years   Plan: 1.  Prescribed Linzess 290 mcg daily 30 minutes before breakfast #  30 with 5 refills.  Also gave her some samples. 2.  Scheduled patient for an EGD with dilation in the LEC.  Will check with Dr. Marina Goodell and Cathlyn Parsons our anesthesiologist to make sure the LEC is a correct placement for her.  Did provide the patient a detailed list of risks for the procedure and she agrees to proceed. 3.  Continue Omeprazole 40 mg twice daily. 4.  Patient to follow in clinic per recommendations after time of procedure.     Hyacinth Meeker, PA-C [facility] 12/31/2022, 2:46 PM   Cc: Elfredia Nevins, MD

## 2023-01-27 NOTE — Op Note (Signed)
[facility] Patient Name: Lorraine Weber Procedure Date: 01/27/2023 3:35 PM MRN: [id number] Endoscopist: Wilhemina Bonito. Marina Goodell , MD, [id number] Age: 42 Referring MD:  Date of Birth: 1980/06/17 Gender: Female Account #: [id number] Procedure:                Upper GI endoscopy with balloon dilation of the                            esophagus?"20 mm max Indications:              Dysphagia, Esophageal reflux Medicines:                Monitored Anesthesia Care Procedure:                Pre-Anesthesia Assessment:                           - Prior to the procedure, a History and Physical                            was performed, and patient medications and                            allergies were reviewed. The patient's tolerance of                            previous anesthesia was also reviewed. The risks                            and benefits of the procedure and the sedation                            options and risks were discussed with the patient.                            All questions were answered, and informed consent                            was obtained. Prior Anticoagulants: The patient has                            taken no anticoagulant or antiplatelet agents. ASA                            Grade Assessment: III - A patient with severe                            systemic disease. After reviewing the risks and                            benefits, the patient was deemed in satisfactory                            condition to undergo the procedure.  After obtaining informed consent, the endoscope was                            passed under direct vision. Throughout the                            procedure, the patient's blood pressure, pulse, and                            oxygen saturations were monitored continuously. The                            Olympus scope [id number] was introduced through the                            mouth, and advanced  to the second part of duodenum.                            The upper GI endoscopy was accomplished without                            difficulty. The patient tolerated the procedure                            well. Scope In: Scope Out: Findings:                 One benign-appearing, intrinsic moderate stenosis                            was found 40 cm from the incisors. This stenosis                            measured 1.5 cm (inner diameter). A TTS dilator was                            passed through the scope. Dilation with an 18-19-20                            mm balloon dilator was performed to 20 mm.                           The exam of the esophagus was otherwise normal.                           The stomach was normal. Small hiatal hernia.                           The examined duodenum was normal.                           The cardia and gastric fundus were normal on  retroflexion. Complications:            No immediate complications. Estimated Blood Loss:     Estimated blood loss: none. Impression:               - Benign-appearing esophageal stenosis. Dilated.                           - Normal stomach. Small hiatal hernia.                           - Normal examined duodenum.                           - No specimens collected. Recommendation:           - Patient has a contact number available for                            emergencies. The signs and symptoms of potential                            delayed complications were discussed with the                            patient. Return to normal activities tomorrow.                            Written discharge instructions were provided to the                            patient.                           -Post dilation diet.                           - Continue present medications.                           -Return to the care of your primary providers. GI                            follow-up as  needed Wilhemina Bonito. Marina Goodell, MD 01/27/2023 4:10:01 PM This report has been signed electronically.

## 2023-01-27 NOTE — Patient Instructions (Signed)
YOU HAD AN ENDOSCOPIC PROCEDURE TODAY: Refer to the procedure report and other information in the discharge instructions given to you for any specific questions about what was found during the examination. If this information does not answer your questions, please call [place] office at [phone] to clarify.   YOU SHOULD EXPECT: Some feelings of bloating in the abdomen. Passage of more gas than usual. Walking can help get rid of the air that was put into your GI tract during the procedure and reduce the bloating. If you had a lower endoscopy (such as a colonoscopy or flexible sigmoidoscopy) you may notice spotting of blood in your stool or on the toilet paper. Some abdominal soreness may be present for a day or two, also.  DIET: Your first meal following the procedure should be a light meal and then it is ok to progress to your normal diet. A half-sandwich or bowl of soup is an example of a good first meal. Heavy or fried foods are harder to digest and may make you feel nauseous or bloated. Drink plenty of fluids but you should avoid alcoholic beverages for 24 hours. If you had a esophageal dilation, please see attached instructions for diet.    ACTIVITY: Your care partner should take you home directly after the procedure. You should plan to take it easy, moving slowly for the rest of the day. You can resume normal activity the day after the procedure however YOU SHOULD NOT DRIVE, use power tools, machinery or perform tasks that involve climbing or major physical exertion for 24 hours (because of the sedation medicines used during the test).   SYMPTOMS TO REPORT IMMEDIATELY: A gastroenterologist can be reached at any hour. Please call [phone]  for any of the following symptoms:  Following upper endoscopy (EGD, EUS, ERCP, esophageal dilation) Vomiting of blood or coffee ground material  New, significant abdominal pain  New, significant chest pain or pain under the shoulder blades  Painful or  persistently difficult swallowing  New shortness of breath  Black, tarry-looking or red, bloody stools  FOLLOW UP:  If any biopsies were taken you will be contacted by phone or by letter within the next 1-3 weeks. Call [phone]  if you have not heard about the biopsies in 3 weeks.  Please also call with any specific questions about appointments or follow up tests.

## 2023-01-28 ENCOUNTER — Telehealth: Payer: Self-pay | Admitting: *Deleted

## 2023-01-28 NOTE — Telephone Encounter (Signed)
  Follow up Call-     01/27/2023    3:31 PM 08/28/2021    2:33 PM  Call back number  Post procedure Call Back phone  # [phone]  Permission to leave phone message Yes Yes     Patient questions:   Message left to call if necessary.

## 2023-02-03 DIAGNOSIS — G932 Benign intracranial hypertension: Secondary | ICD-10-CM | POA: Diagnosis not present

## 2023-02-11 DIAGNOSIS — M792 Neuralgia and neuritis, unspecified: Secondary | ICD-10-CM | POA: Diagnosis not present

## 2023-02-11 DIAGNOSIS — G43009 Migraine without aura, not intractable, without status migrainosus: Secondary | ICD-10-CM | POA: Diagnosis not present

## 2023-03-10 DIAGNOSIS — G43709 Chronic migraine without aura, not intractable, without status migrainosus: Secondary | ICD-10-CM | POA: Diagnosis not present

## 2023-03-27 DIAGNOSIS — I7410 Embolism and thrombosis of unspecified parts of aorta: Secondary | ICD-10-CM | POA: Diagnosis not present

## 2023-03-27 DIAGNOSIS — Q796 Ehlers-Danlos syndrome, unspecified: Secondary | ICD-10-CM | POA: Diagnosis not present

## 2023-03-27 DIAGNOSIS — M3211 Endocarditis in systemic lupus erythematosus: Secondary | ICD-10-CM | POA: Diagnosis not present

## 2023-03-27 DIAGNOSIS — G43111 Migraine with aura, intractable, with status migrainosus: Secondary | ICD-10-CM | POA: Diagnosis not present

## 2023-03-27 DIAGNOSIS — D68318 Other hemorrhagic disorder due to intrinsic circulating anticoagulants, antibodies, or inhibitors: Secondary | ICD-10-CM | POA: Diagnosis not present

## 2023-03-27 DIAGNOSIS — I5031 Acute diastolic (congestive) heart failure: Secondary | ICD-10-CM | POA: Diagnosis not present

## 2023-04-11 DIAGNOSIS — M5416 Radiculopathy, lumbar region: Secondary | ICD-10-CM | POA: Diagnosis not present

## 2023-04-11 DIAGNOSIS — G43111 Migraine with aura, intractable, with status migrainosus: Secondary | ICD-10-CM | POA: Diagnosis not present

## 2023-04-24 DIAGNOSIS — M1990 Unspecified osteoarthritis, unspecified site: Secondary | ICD-10-CM | POA: Diagnosis not present

## 2023-04-24 DIAGNOSIS — Z79899 Other long term (current) drug therapy: Secondary | ICD-10-CM | POA: Diagnosis not present

## 2023-04-24 DIAGNOSIS — M359 Systemic involvement of connective tissue, unspecified: Secondary | ICD-10-CM | POA: Diagnosis not present

## 2023-04-24 DIAGNOSIS — M7061 Trochanteric bursitis, right hip: Secondary | ICD-10-CM | POA: Diagnosis not present

## 2023-04-24 DIAGNOSIS — M3500 Sicca syndrome, unspecified: Secondary | ICD-10-CM | POA: Diagnosis not present

## 2023-05-12 DIAGNOSIS — G43111 Migraine with aura, intractable, with status migrainosus: Secondary | ICD-10-CM | POA: Diagnosis not present

## 2023-05-12 DIAGNOSIS — M792 Neuralgia and neuritis, unspecified: Secondary | ICD-10-CM | POA: Diagnosis not present

## 2023-06-05 DIAGNOSIS — Z1231 Encounter for screening mammogram for malignant neoplasm of breast: Secondary | ICD-10-CM | POA: Diagnosis not present

## 2023-06-05 DIAGNOSIS — N390 Urinary tract infection, site not specified: Secondary | ICD-10-CM | POA: Diagnosis not present

## 2023-06-09 DIAGNOSIS — G932 Benign intracranial hypertension: Secondary | ICD-10-CM | POA: Diagnosis not present

## 2023-06-11 DIAGNOSIS — H5319 Other subjective visual disturbances: Secondary | ICD-10-CM | POA: Diagnosis not present

## 2023-06-11 DIAGNOSIS — I080 Rheumatic disorders of both mitral and aortic valves: Secondary | ICD-10-CM | POA: Diagnosis not present

## 2023-06-11 DIAGNOSIS — R519 Headache, unspecified: Secondary | ICD-10-CM | POA: Diagnosis not present

## 2023-06-11 DIAGNOSIS — R1110 Vomiting, unspecified: Secondary | ICD-10-CM | POA: Diagnosis not present

## 2023-06-18 DIAGNOSIS — M792 Neuralgia and neuritis, unspecified: Secondary | ICD-10-CM | POA: Diagnosis not present

## 2023-06-18 DIAGNOSIS — G43111 Migraine with aura, intractable, with status migrainosus: Secondary | ICD-10-CM | POA: Diagnosis not present

## 2023-06-18 DIAGNOSIS — I10 Essential (primary) hypertension: Secondary | ICD-10-CM | POA: Diagnosis not present

## 2023-06-18 DIAGNOSIS — Q796 Ehlers-Danlos syndrome, unspecified: Secondary | ICD-10-CM | POA: Diagnosis not present

## 2023-06-18 DIAGNOSIS — G971 Other reaction to spinal and lumbar puncture: Secondary | ICD-10-CM | POA: Diagnosis not present

## 2023-06-28 DIAGNOSIS — G932 Benign intracranial hypertension: Secondary | ICD-10-CM | POA: Diagnosis not present

## 2023-06-28 DIAGNOSIS — Z982 Presence of cerebrospinal fluid drainage device: Secondary | ICD-10-CM | POA: Diagnosis not present

## 2023-06-28 DIAGNOSIS — G919 Hydrocephalus, unspecified: Secondary | ICD-10-CM | POA: Diagnosis not present

## 2023-07-04 DIAGNOSIS — G8929 Other chronic pain: Secondary | ICD-10-CM | POA: Diagnosis not present

## 2023-07-04 DIAGNOSIS — R519 Headache, unspecified: Secondary | ICD-10-CM | POA: Diagnosis not present

## 2023-07-08 DIAGNOSIS — G43709 Chronic migraine without aura, not intractable, without status migrainosus: Secondary | ICD-10-CM | POA: Diagnosis not present

## 2023-07-14 DIAGNOSIS — I340 Nonrheumatic mitral (valve) insufficiency: Secondary | ICD-10-CM | POA: Diagnosis not present

## 2023-07-14 DIAGNOSIS — Z01818 Encounter for other preprocedural examination: Secondary | ICD-10-CM | POA: Diagnosis not present

## 2023-07-14 DIAGNOSIS — I351 Nonrheumatic aortic (valve) insufficiency: Secondary | ICD-10-CM | POA: Diagnosis not present

## 2023-07-14 DIAGNOSIS — I10 Essential (primary) hypertension: Secondary | ICD-10-CM | POA: Diagnosis not present

## 2023-07-14 DIAGNOSIS — G44001 Cluster headache syndrome, unspecified, intractable: Secondary | ICD-10-CM | POA: Diagnosis not present

## 2023-07-16 DIAGNOSIS — Z79899 Other long term (current) drug therapy: Secondary | ICD-10-CM | POA: Diagnosis not present

## 2023-07-16 DIAGNOSIS — G9600 Cerebrospinal fluid leak, unspecified: Secondary | ICD-10-CM | POA: Diagnosis not present

## 2023-07-16 DIAGNOSIS — Z881 Allergy status to other antibiotic agents status: Secondary | ICD-10-CM | POA: Diagnosis not present

## 2023-07-16 DIAGNOSIS — Z886 Allergy status to analgesic agent status: Secondary | ICD-10-CM | POA: Diagnosis not present

## 2023-07-16 DIAGNOSIS — Z981 Arthrodesis status: Secondary | ICD-10-CM | POA: Diagnosis not present

## 2023-07-16 DIAGNOSIS — Z87891 Personal history of nicotine dependence: Secondary | ICD-10-CM | POA: Diagnosis not present

## 2023-07-16 DIAGNOSIS — Z4541 Encounter for adjustment and management of cerebrospinal fluid drainage device: Secondary | ICD-10-CM | POA: Diagnosis not present

## 2023-07-16 DIAGNOSIS — G932 Benign intracranial hypertension: Secondary | ICD-10-CM | POA: Diagnosis not present

## 2023-07-16 DIAGNOSIS — Z982 Presence of cerebrospinal fluid drainage device: Secondary | ICD-10-CM | POA: Diagnosis not present

## 2023-07-16 DIAGNOSIS — I10 Essential (primary) hypertension: Secondary | ICD-10-CM | POA: Diagnosis not present

## 2023-07-16 DIAGNOSIS — Z888 Allergy status to other drugs, medicaments and biological substances status: Secondary | ICD-10-CM | POA: Diagnosis not present

## 2023-07-29 ENCOUNTER — Telehealth (HOSPITAL_COMMUNITY): Payer: Self-pay | Admitting: Cardiovascular Disease

## 2023-07-29 NOTE — Telephone Encounter (Signed)
 I called to schedule patients yearly echocardiogram and patient did not wish to schedule at this time and will call us  back when she is ready to do so. Order will be removed from the echo WQ. We can reinstate the order when she does. Thank you.

## 2023-08-08 DIAGNOSIS — G932 Benign intracranial hypertension: Secondary | ICD-10-CM | POA: Diagnosis not present

## 2023-08-25 DIAGNOSIS — H5213 Myopia, bilateral: Secondary | ICD-10-CM | POA: Diagnosis not present

## 2023-09-03 ENCOUNTER — Other Ambulatory Visit: Payer: Self-pay | Admitting: Emergency Medicine

## 2023-09-03 DIAGNOSIS — I10 Essential (primary) hypertension: Secondary | ICD-10-CM | POA: Diagnosis not present

## 2023-09-03 DIAGNOSIS — J329 Chronic sinusitis, unspecified: Secondary | ICD-10-CM | POA: Diagnosis not present

## 2023-09-03 DIAGNOSIS — Q796 Ehlers-Danlos syndrome, unspecified: Secondary | ICD-10-CM | POA: Diagnosis not present

## 2023-09-03 DIAGNOSIS — I351 Nonrheumatic aortic (valve) insufficiency: Secondary | ICD-10-CM

## 2023-09-03 DIAGNOSIS — I340 Nonrheumatic mitral (valve) insufficiency: Secondary | ICD-10-CM

## 2023-09-03 DIAGNOSIS — M792 Neuralgia and neuritis, unspecified: Secondary | ICD-10-CM | POA: Diagnosis not present

## 2023-09-03 NOTE — Progress Notes (Unsigned)
 Pt needs Echo in July- order about to expire- so discontinued it and placed new order

## 2023-09-11 DIAGNOSIS — G43111 Migraine with aura, intractable, with status migrainosus: Secondary | ICD-10-CM | POA: Diagnosis not present

## 2023-09-11 DIAGNOSIS — I10 Essential (primary) hypertension: Secondary | ICD-10-CM | POA: Diagnosis not present

## 2023-09-11 DIAGNOSIS — K219 Gastro-esophageal reflux disease without esophagitis: Secondary | ICD-10-CM | POA: Diagnosis not present

## 2023-09-17 ENCOUNTER — Encounter (HOSPITAL_COMMUNITY): Payer: Self-pay | Admitting: Cardiovascular Disease

## 2023-09-18 DIAGNOSIS — R519 Headache, unspecified: Secondary | ICD-10-CM | POA: Diagnosis not present

## 2023-09-18 DIAGNOSIS — I340 Nonrheumatic mitral (valve) insufficiency: Secondary | ICD-10-CM | POA: Diagnosis not present

## 2023-09-18 DIAGNOSIS — R42 Dizziness and giddiness: Secondary | ICD-10-CM | POA: Diagnosis not present

## 2023-09-18 DIAGNOSIS — R634 Abnormal weight loss: Secondary | ICD-10-CM | POA: Diagnosis not present

## 2023-09-18 DIAGNOSIS — G9600 Cerebrospinal fluid leak, unspecified: Secondary | ICD-10-CM | POA: Diagnosis not present

## 2023-09-18 DIAGNOSIS — Z79899 Other long term (current) drug therapy: Secondary | ICD-10-CM | POA: Diagnosis not present

## 2023-09-18 DIAGNOSIS — R112 Nausea with vomiting, unspecified: Secondary | ICD-10-CM | POA: Diagnosis not present

## 2023-10-10 DIAGNOSIS — Q796 Ehlers-Danlos syndrome, unspecified: Secondary | ICD-10-CM | POA: Diagnosis not present

## 2023-10-10 DIAGNOSIS — J329 Chronic sinusitis, unspecified: Secondary | ICD-10-CM | POA: Diagnosis not present

## 2023-10-10 DIAGNOSIS — I10 Essential (primary) hypertension: Secondary | ICD-10-CM | POA: Diagnosis not present

## 2023-10-15 DIAGNOSIS — G43719 Chronic migraine without aura, intractable, without status migrainosus: Secondary | ICD-10-CM | POA: Diagnosis not present

## 2023-10-15 DIAGNOSIS — G43709 Chronic migraine without aura, not intractable, without status migrainosus: Secondary | ICD-10-CM | POA: Diagnosis not present

## 2023-10-24 DIAGNOSIS — I351 Nonrheumatic aortic (valve) insufficiency: Secondary | ICD-10-CM | POA: Diagnosis not present

## 2023-10-24 DIAGNOSIS — I10 Essential (primary) hypertension: Secondary | ICD-10-CM | POA: Diagnosis not present

## 2023-10-24 DIAGNOSIS — I7410 Embolism and thrombosis of unspecified parts of aorta: Secondary | ICD-10-CM | POA: Diagnosis not present

## 2023-10-24 DIAGNOSIS — I340 Nonrheumatic mitral (valve) insufficiency: Secondary | ICD-10-CM | POA: Diagnosis not present

## 2023-10-30 DIAGNOSIS — G932 Benign intracranial hypertension: Secondary | ICD-10-CM | POA: Diagnosis not present

## 2023-10-30 DIAGNOSIS — R110 Nausea: Secondary | ICD-10-CM | POA: Diagnosis not present

## 2023-10-30 DIAGNOSIS — Z862 Personal history of diseases of the blood and blood-forming organs and certain disorders involving the immune mechanism: Secondary | ICD-10-CM | POA: Diagnosis not present

## 2023-10-30 DIAGNOSIS — Z982 Presence of cerebrospinal fluid drainage device: Secondary | ICD-10-CM | POA: Diagnosis not present

## 2023-10-30 DIAGNOSIS — Z8639 Personal history of other endocrine, nutritional and metabolic disease: Secondary | ICD-10-CM | POA: Diagnosis not present

## 2023-10-30 DIAGNOSIS — R7989 Other specified abnormal findings of blood chemistry: Secondary | ICD-10-CM | POA: Diagnosis not present

## 2023-10-30 DIAGNOSIS — R519 Headache, unspecified: Secondary | ICD-10-CM | POA: Diagnosis not present

## 2023-10-30 DIAGNOSIS — R946 Abnormal results of thyroid function studies: Secondary | ICD-10-CM | POA: Diagnosis not present

## 2023-10-30 DIAGNOSIS — E042 Nontoxic multinodular goiter: Secondary | ICD-10-CM | POA: Diagnosis not present

## 2023-11-05 DIAGNOSIS — I10 Essential (primary) hypertension: Secondary | ICD-10-CM | POA: Diagnosis not present

## 2023-11-05 DIAGNOSIS — I340 Nonrheumatic mitral (valve) insufficiency: Secondary | ICD-10-CM | POA: Diagnosis not present

## 2023-11-05 DIAGNOSIS — I351 Nonrheumatic aortic (valve) insufficiency: Secondary | ICD-10-CM | POA: Diagnosis not present

## 2023-11-05 DIAGNOSIS — I7410 Embolism and thrombosis of unspecified parts of aorta: Secondary | ICD-10-CM | POA: Diagnosis not present

## 2023-11-10 DIAGNOSIS — N390 Urinary tract infection, site not specified: Secondary | ICD-10-CM | POA: Diagnosis not present

## 2023-11-28 NOTE — Progress Notes (Unsigned)
 11/28/2023 Indigo D Moquin [id number] 03-09-1980   Chief Complaint:  History of Present Illness: Lorraine Weber. Lorraine Weber is a 43 year old female with a past medical history of aortic valve insufficiency, Ehlers Danlos syndrome, von Willebrand disease, dysphagia secondary to esophageal stenosis status post dilatation 08/2021, chronic constipation and a colon adenoma.  She is known by Dr. Abran.     Latest Ref Rng & Units 05/13/2021   11:32 AM 05/12/2020    2:32 PM 02/17/2020    5:19 PM  CBC  WBC 4.0 - 10.5 K/uL 8.2  6.2  5.2   Hemoglobin 12.0 - 15.0 g/dL 86.1  87.1  87.2   Hematocrit 36.0 - 46.0 % 41.7  38.4  39.3   Platelets 150 - 400 K/uL 309  311  315        Latest Ref Rng & Units 05/13/2021   11:32 AM 05/12/2020    2:32 PM 03/03/2020    3:00 PM  CMP  Glucose 70 - 99 mg/dL 94  89  81   BUN 6 - 20 mg/dL 9  11  12    Creatinine 0.44 - 1.00 mg/dL 9.08  9.17  9.06   Sodium 135 - 145 mmol/L 137  141  140   Potassium 3.5 - 5.1 mmol/L 4.0  4.8  4.8   Chloride 98 - 111 mmol/L 99  105  107   CO2 22 - 32 mmol/L 27  20  22    Calcium 8.9 - 10.3 mg/dL 9.7  9.0  9.1   Total Protein 6.5 - 8.1 g/dL 8.4     Total Bilirubin 0.3 - 1.2 mg/dL 0.8     Alkaline Phos 38 - 126 U/L 101     AST 15 - 41 U/L 13     ALT 0 - 44 U/L 6        ECHO 09/05/2022: Left ventricular ejection fraction, by estimation, is 60 to 65%. The left ventricle has normal function. The left ventricle has no regional wall motion abnormalities. Left ventricular diastolic parameters were normal.  Right ventricular systolic function is normal. The right ventricular size is normal.  Left atrial size was mildly dilated. The mitral valve is myxomatous. Moderate to severe mitral valve regurgitation. No evidence of mitral stenosis.  Tricuspid valve regurgitation is moderate. The aortic valve is abnormal. There is moderate thickening of the aortic valve. Aortic valve regurgitation is severe. No aortic stenosis is present. Aortic regurgitation PHT  measures The inferior vena cava is normal in size with greater than 50% respiratory variability, suggesting right atrial pressure of 3 mmHg.  GI PROCEDURES:  EGD 01/27/2023: - Benign-appearing esophageal stenosis. Dilated.  - Normal stomach. Small hiatal hernia.  - Normal examined duodenum.  - No specimens collected.  EGD 08/28/2021: - Benign-appearing esophageal stenosis. Dilated. - Normal stomach.  - Normal examined duodenum. - No specimens collected.  Colonoscopy 12/12/2017: - The examined portion of the ileum was normal.  - One 5 mm polyp in the transverse colon, removed with a cold snare. Resected and retrieved.  - The examination was otherwise normal on direct and retroflexion views. Surgical [P], transverse, polyp - TUBULAR ADENOMA WITHOUT HIGH GRADE DYSPLASIA OR MALIGNANCY. - LATERAL MUCOSAL MARGINS ARE NEGATIVE FOR DYSPLASIA.   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   Macular rash 09/25/2020   Mastoiditis    Migraines    Nephrolithiasis    X5, Dr Watt, passed stones, no surgery   Peptic ulcer disease    PONV (postoperative nausea and vomiting)    Septic joint of left wrist (HCC) 09/25/2020   Von Willebrand disease (HCC)    mild form per patient   Past Surgical History:  Procedure Laterality Date   APPENDECTOMY     cerebral stent Bilateral    jugular vein   CERVICAL FUSION  2018   C5-C7   DILITATION & CURRETTAGE/HYSTROSCOPY WITH NOVASURE ABLATION N/A 08/23/2015   Procedure: DILATATION & CURETTAGE/HYSTEROSCOPY WITH NOVASURE ABLATION;  Surgeon: Marjorie Gull, MD;   Location: [facility];  Service: Gynecology;  Laterality: N/A;   EXTRACORPOREAL SHOCK WAVE LITHOTRIPSY Left 03/19/2018   Procedure: EXTRACORPOREAL SHOCK WAVE LITHOTRIPSY (ESWL);  Surgeon: Gaston Hamilton, MD;  Location: [facility];  Service: Urology;  Laterality: Left;   HAND TENDON SURGERY Left    left, complicated by MRSA   LAMINECTOMY  21016   T10-T12   SHUNT REMOVAL  05/2021   spinal fluid leak patching     > 10X;[facility] - blood patching   SPINAL FUSION  2017   T10-T11   SPINAL FUSION  2018   T10-L1   TEE WITHOUT CARDIOVERSION N/A 05/17/2020   Procedure: TRANSESOPHAGEAL ECHOCARDIOGRAM (TEE);  Surgeon: Francyne Headland, MD;  Location: [facility];  Service: Cardiovascular;  Laterality: N/A;   VENTRICULOPERITONEAL SHUNT  12/2020   WISDOM TOOTH EXTRACTION         Current Medications, Allergies, Past Medical History, Past Surgical History, Family History and Social History were reviewed in [employer] record.   Review of Systems:   Constitutional: Negative for fever, sweats, chills or weight loss.  Respiratory: Negative for shortness of breath.   Cardiovascular: Negative for chest pain, palpitations and leg swelling.  Gastrointestinal: See HPI.  Musculoskeletal: Negative for back pain or muscle aches.  Neurological: Negative for dizziness, headaches or paresthesias.    Physical Exam: There were no vitals taken for this visit. General: in no acute distress. Head: Normocephalic and atraumatic. Eyes: No scleral icterus. Conjunctiva pink . Ears: Normal auditory acuity. Mouth: Dentition intact. No ulcers or lesions.  Lungs: Clear throughout to auscultation. Heart: Regular rate and rhythm, no murmur. Abdomen: Soft, nontender and nondistended. No masses or hepatomegaly. Normal bowel sounds x 4 quadrants.  Rectal: *** Musculoskeletal: Symmetrical with no gross deformities. Extremities: No edema. Neurological: Alert oriented x 4. No focal deficits.   Psychological: Alert and cooperative. Normal mood and affect  Assessment and Recommendations: ***

## 2023-12-01 ENCOUNTER — Encounter: Payer: Self-pay | Admitting: Nurse Practitioner

## 2023-12-01 ENCOUNTER — Telehealth: Payer: Self-pay | Admitting: *Deleted

## 2023-12-01 ENCOUNTER — Ambulatory Visit: Admitting: Nurse Practitioner

## 2023-12-01 ENCOUNTER — Encounter: Payer: Self-pay | Admitting: *Deleted

## 2023-12-01 VITALS — BP 108/64 | HR 86 | Ht 64.0 in | Wt 148.5 lb

## 2023-12-01 DIAGNOSIS — K5909 Other constipation: Secondary | ICD-10-CM | POA: Diagnosis not present

## 2023-12-01 DIAGNOSIS — R1310 Dysphagia, unspecified: Secondary | ICD-10-CM | POA: Diagnosis not present

## 2023-12-01 DIAGNOSIS — Z860101 Personal history of adenomatous and serrated colon polyps: Secondary | ICD-10-CM | POA: Diagnosis not present

## 2023-12-01 MED ORDER — LINACLOTIDE 145 MCG PO CAPS: 145.0000 ug | ORAL_CAPSULE | Freq: Every day | ORAL | 1 refills | Status: AC

## 2023-12-01 NOTE — Patient Instructions (Addendum)
 Drink 3 sips of tap water before swallowing any pills or food  Cut your food into small pieces and chew food thoroughly   Avoid eating large pieces of meat, bread or  rice   Contact our office if your symptoms worsen    Our office will contact you to schedule an upper endoscopy with esophageal dilatation once cardiac clearance received   I appreciate the  opportunity to care for you  Thank You   [facility]

## 2023-12-01 NOTE — Telephone Encounter (Signed)
 Faxed letter to Murray Durand Pride,MD    [address] [place] [address] [phone] Fax: [phone]   Lorraine Weber Dec 26, 1980 [id number]  12/01/2023   Dear Murray Khan, MD:  We are going to schedule the above named patient for a(n) Endoscopy procedure. But we need a cardiac   clearance before we proceed.  Please advise as to whether the patient can be cleared to have an Upper Endoscopy (EGD).   Please route your response to Grayce Bettie LATHER or fax response to [phone].  Sincerely,   Elida Cara CARROLYN [facility]

## 2023-12-02 ENCOUNTER — Telehealth: Payer: Self-pay | Admitting: *Deleted

## 2023-12-02 DIAGNOSIS — K219 Gastro-esophageal reflux disease without esophagitis: Secondary | ICD-10-CM

## 2023-12-02 DIAGNOSIS — R1310 Dysphagia, unspecified: Secondary | ICD-10-CM

## 2023-12-02 NOTE — Telephone Encounter (Signed)
 Cara Elida HERO, NP at 12/01/2023  3:00 PM  Status: Signed  Linda/Dottie, pls contact patient and let her know due to her ongoing cardiac evaluation for future aortic valve surgery, Dr. Abran does not recommend an upper endoscopy at this point.  He felt it would be best for her cardiac issues to be completely addressed prior to pursuing an EGD.  Please schedule her for a barium swallow with tablet to further assess her esophagus and to rule out evidence of worsening stenosis as well as esophageal dysmotility.  Please let her know she is due for colonoscopy anytime between November 2026 and November 2029.  Patient to contact her office if her dysphagia symptoms worsen.  Thank you

## 2023-12-02 NOTE — Telephone Encounter (Signed)
 I have spoken to patient to advise of Dr Nancyann recommendation to hold off on endoscopy until cardiac workup has been completed. Advised that instead, we will order barium swallow to assess for stricture and look at esophageal function. Orders have been placed in EP[facility] for this and patient says she will call radiology to get this scheduled when she has her calendar available.   Patient is also advised that she will be due for colonoscopy between November 2026-2029. She verbalizes understanding of this information.

## 2023-12-02 NOTE — Progress Notes (Signed)
 Linda/Dottie, pls contact patient and let her know due to her ongoing cardiac evaluation for future aortic valve surgery, Dr. Abran does not recommend an upper endoscopy at this point.  He felt it would be best for her cardiac issues to be completely addressed prior to pursuing an EGD.  Please schedule her for a barium swallow with tablet to further assess her esophagus and to rule out evidence of worsening stenosis as well as esophageal dysmotility.  Please let her know she is due for colonoscopy anytime between November 2026 and November 2029.  Patient to contact her office if her dysphagia symptoms worsen.  Thank you

## 2023-12-09 NOTE — Telephone Encounter (Signed)
 See Phone note from 10/21. Dr Abran wants patient to have a barium esophagram before EGD. Too high Risk   Patient said she would call radiology to schedule which she has not done.   Avoid Cardiac clearance at this time, if any questions ask Dottie

## 2024-01-23 ENCOUNTER — Telehealth (HOSPITAL_COMMUNITY): Payer: Self-pay

## 2024-01-23 NOTE — Telephone Encounter (Signed)
 Outside/paper referral received by Dr. Vinita from [facility]. Insurance benefits and eligibility to be determined. Will fax request for EKG after it is completed on 12/23.  Patient may be interested in program in [place] but due to her husband needing to drive her, they will see if other cardiac rehab programs have later times that work better with his work schedule. Otherwise they will proceed with cardiac rehab here.  F/u 12/23 & 1/07.

## 2024-02-03 ENCOUNTER — Telehealth (HOSPITAL_COMMUNITY): Payer: Self-pay

## 2024-02-03 NOTE — Telephone Encounter (Signed)
 Patient has not confirmed interest in attending program yet, will f/u 12/29 to see if we should proceed with processing referral.

## 2024-02-03 NOTE — Telephone Encounter (Signed)
 Another outside/paper referral received from [facility] for cardiac rehab. Patient has been admitted to hospital since initial referral was received, waiting for patient to attend f/u appts. EKG was scheduled for 12/23, will request once completed.

## 2024-02-09 ENCOUNTER — Telehealth (HOSPITAL_COMMUNITY): Payer: Self-pay

## 2024-02-09 NOTE — Telephone Encounter (Signed)
 Patient called back confirming she would like to attend cardiac rehab at [facility], faxed referral.

## 2024-02-09 NOTE — Telephone Encounter (Signed)
 Called patient to confirm if she is interested in cardiac rehab, patient states she will check with [facility] to see if they have better hours for her schedule as she is unable to drive and needs to work around her husband's schedule.
